# Patient Record
Sex: Female | Born: 1970 | Race: White | Hispanic: No | Smoking: Current every day smoker
Health system: Southern US, Community
[De-identification: ages and names within clinical notes are randomized; demographics above are authoritative.]

## PROBLEM LIST (undated history)

## (undated) DIAGNOSIS — R7881 Bacteremia: Secondary | ICD-10-CM

## (undated) DIAGNOSIS — K219 Gastro-esophageal reflux disease without esophagitis: Secondary | ICD-10-CM

## (undated) DIAGNOSIS — L0291 Cutaneous abscess, unspecified: Secondary | ICD-10-CM

## (undated) DIAGNOSIS — IMO0002 Reserved for concepts with insufficient information to code with codable children: Secondary | ICD-10-CM

## (undated) DIAGNOSIS — K7581 Nonalcoholic steatohepatitis (NASH): Secondary | ICD-10-CM

## (undated) DIAGNOSIS — M1990 Unspecified osteoarthritis, unspecified site: Secondary | ICD-10-CM

## (undated) DIAGNOSIS — E7800 Pure hypercholesterolemia, unspecified: Secondary | ICD-10-CM

## (undated) DIAGNOSIS — F1910 Other psychoactive substance abuse, uncomplicated: Secondary | ICD-10-CM

## (undated) DIAGNOSIS — F329 Major depressive disorder, single episode, unspecified: Secondary | ICD-10-CM

## (undated) DIAGNOSIS — K5900 Constipation, unspecified: Secondary | ICD-10-CM

## (undated) DIAGNOSIS — F32A Depression, unspecified: Secondary | ICD-10-CM

## (undated) DIAGNOSIS — M5430 Sciatica, unspecified side: Secondary | ICD-10-CM

## (undated) DIAGNOSIS — I10 Essential (primary) hypertension: Secondary | ICD-10-CM

## (undated) DIAGNOSIS — J189 Pneumonia, unspecified organism: Secondary | ICD-10-CM

## (undated) DIAGNOSIS — E282 Polycystic ovarian syndrome: Secondary | ICD-10-CM

## (undated) DIAGNOSIS — H9190 Unspecified hearing loss, unspecified ear: Secondary | ICD-10-CM

## (undated) DIAGNOSIS — K7460 Unspecified cirrhosis of liver: Secondary | ICD-10-CM

## (undated) DIAGNOSIS — G8929 Other chronic pain: Secondary | ICD-10-CM

## (undated) DIAGNOSIS — R0600 Dyspnea, unspecified: Secondary | ICD-10-CM

## (undated) DIAGNOSIS — M549 Dorsalgia, unspecified: Secondary | ICD-10-CM

## (undated) HISTORY — DX: Pure hypercholesterolemia, unspecified: E78.00

## (undated) HISTORY — PX: CHOLECYSTECTOMY: SHX55

## (undated) HISTORY — PX: BACK SURGERY: SHX140

## (undated) HISTORY — DX: Unspecified cirrhosis of liver: K74.60

## (undated) HISTORY — PX: BREAST SURGERY: SHX581

## (undated) HISTORY — PX: CARPAL TUNNEL RELEASE: SHX101

## (undated) HISTORY — PX: NECK SURGERY: SHX720

## (undated) HISTORY — DX: Unspecified hearing loss, unspecified ear: H91.90

## (undated) HISTORY — DX: Unspecified osteoarthritis, unspecified site: M19.90

---

## 1999-06-09 ENCOUNTER — Emergency Department (HOSPITAL_COMMUNITY): Admission: EM | Admit: 1999-06-09 | Discharge: 1999-06-09 | Payer: Self-pay | Admitting: Emergency Medicine

## 1999-06-15 ENCOUNTER — Emergency Department (HOSPITAL_COMMUNITY): Admission: EM | Admit: 1999-06-15 | Discharge: 1999-06-15 | Payer: Self-pay | Admitting: Emergency Medicine

## 1999-06-22 ENCOUNTER — Emergency Department (HOSPITAL_COMMUNITY): Admission: EM | Admit: 1999-06-22 | Discharge: 1999-06-22 | Payer: Self-pay | Admitting: Emergency Medicine

## 1999-07-09 ENCOUNTER — Encounter: Payer: Self-pay | Admitting: Neurosurgery

## 1999-07-09 ENCOUNTER — Ambulatory Visit (HOSPITAL_COMMUNITY): Admission: RE | Admit: 1999-07-09 | Discharge: 1999-07-09 | Payer: Self-pay | Admitting: Neurosurgery

## 1999-08-01 ENCOUNTER — Ambulatory Visit (HOSPITAL_COMMUNITY): Admission: RE | Admit: 1999-08-01 | Discharge: 1999-08-02 | Payer: Self-pay | Admitting: Neurosurgery

## 1999-08-01 ENCOUNTER — Encounter: Payer: Self-pay | Admitting: Neurosurgery

## 1999-10-29 ENCOUNTER — Encounter: Payer: Self-pay | Admitting: Neurosurgery

## 1999-10-29 ENCOUNTER — Ambulatory Visit (HOSPITAL_COMMUNITY): Admission: RE | Admit: 1999-10-29 | Discharge: 1999-10-30 | Payer: Self-pay | Admitting: Neurosurgery

## 2000-03-24 ENCOUNTER — Encounter: Payer: Self-pay | Admitting: Neurosurgery

## 2000-03-24 ENCOUNTER — Ambulatory Visit (HOSPITAL_COMMUNITY): Admission: RE | Admit: 2000-03-24 | Discharge: 2000-03-24 | Payer: Self-pay | Admitting: Neurosurgery

## 2000-04-13 ENCOUNTER — Ambulatory Visit (HOSPITAL_COMMUNITY): Admission: RE | Admit: 2000-04-13 | Discharge: 2000-04-13 | Payer: Self-pay | Admitting: Neurosurgery

## 2000-04-16 ENCOUNTER — Encounter: Payer: Self-pay | Admitting: Neurosurgery

## 2000-04-17 ENCOUNTER — Inpatient Hospital Stay (HOSPITAL_COMMUNITY): Admission: RE | Admit: 2000-04-17 | Discharge: 2000-04-18 | Payer: Self-pay | Admitting: Neurosurgery

## 2000-05-18 ENCOUNTER — Encounter: Payer: Self-pay | Admitting: Neurosurgery

## 2000-05-18 ENCOUNTER — Ambulatory Visit (HOSPITAL_COMMUNITY): Admission: RE | Admit: 2000-05-18 | Discharge: 2000-05-18 | Payer: Self-pay | Admitting: Neurosurgery

## 2000-05-26 ENCOUNTER — Encounter (HOSPITAL_COMMUNITY): Admission: RE | Admit: 2000-05-26 | Discharge: 2000-06-25 | Payer: Self-pay | Admitting: Neurosurgery

## 2000-06-26 ENCOUNTER — Encounter (HOSPITAL_COMMUNITY): Admission: RE | Admit: 2000-06-26 | Discharge: 2000-07-26 | Payer: Self-pay | Admitting: Neurosurgery

## 2000-06-29 ENCOUNTER — Ambulatory Visit (HOSPITAL_COMMUNITY): Admission: RE | Admit: 2000-06-29 | Discharge: 2000-06-29 | Payer: Self-pay | Admitting: Neurosurgery

## 2000-06-29 ENCOUNTER — Encounter: Payer: Self-pay | Admitting: Neurosurgery

## 2000-07-26 ENCOUNTER — Encounter (HOSPITAL_COMMUNITY): Admission: RE | Admit: 2000-07-26 | Discharge: 2000-08-25 | Payer: Self-pay | Admitting: Neurosurgery

## 2000-08-26 ENCOUNTER — Encounter: Admission: RE | Admit: 2000-08-26 | Discharge: 2000-08-26 | Payer: Self-pay | Admitting: Neurosurgery

## 2000-08-26 ENCOUNTER — Encounter: Payer: Self-pay | Admitting: Neurosurgery

## 2000-09-09 ENCOUNTER — Encounter: Admission: RE | Admit: 2000-09-09 | Discharge: 2000-09-09 | Payer: Self-pay | Admitting: Neurosurgery

## 2000-09-09 ENCOUNTER — Encounter: Payer: Self-pay | Admitting: Neurosurgery

## 2000-09-21 ENCOUNTER — Encounter: Payer: Self-pay | Admitting: Neurosurgery

## 2000-09-21 ENCOUNTER — Encounter: Admission: RE | Admit: 2000-09-21 | Discharge: 2000-09-21 | Payer: Self-pay | Admitting: Neurosurgery

## 2000-09-21 ENCOUNTER — Ambulatory Visit (HOSPITAL_COMMUNITY): Admission: RE | Admit: 2000-09-21 | Discharge: 2000-09-21 | Payer: Self-pay | Admitting: Neurosurgery

## 2000-12-21 ENCOUNTER — Other Ambulatory Visit: Admission: RE | Admit: 2000-12-21 | Discharge: 2000-12-21 | Payer: Self-pay

## 2001-10-07 ENCOUNTER — Encounter: Admission: RE | Admit: 2001-10-07 | Discharge: 2001-10-07 | Payer: Self-pay | Admitting: Neurosurgery

## 2001-10-07 ENCOUNTER — Encounter: Payer: Self-pay | Admitting: Neurosurgery

## 2002-03-07 ENCOUNTER — Other Ambulatory Visit: Admission: RE | Admit: 2002-03-07 | Discharge: 2002-03-07 | Payer: Self-pay

## 2003-02-16 ENCOUNTER — Ambulatory Visit (HOSPITAL_COMMUNITY): Admission: RE | Admit: 2003-02-16 | Discharge: 2003-02-16 | Payer: Self-pay | Admitting: Neurosurgery

## 2003-11-14 ENCOUNTER — Ambulatory Visit (HOSPITAL_COMMUNITY): Admission: RE | Admit: 2003-11-14 | Discharge: 2003-11-14 | Payer: Self-pay | Admitting: Obstetrics and Gynecology

## 2003-11-27 ENCOUNTER — Ambulatory Visit (HOSPITAL_COMMUNITY): Admission: RE | Admit: 2003-11-27 | Discharge: 2003-11-27 | Payer: Self-pay | Admitting: General Surgery

## 2004-02-12 ENCOUNTER — Ambulatory Visit (HOSPITAL_COMMUNITY): Admission: RE | Admit: 2004-02-12 | Discharge: 2004-02-12 | Payer: Self-pay | Admitting: Internal Medicine

## 2004-04-09 ENCOUNTER — Emergency Department (HOSPITAL_COMMUNITY): Admission: EM | Admit: 2004-04-09 | Discharge: 2004-04-09 | Payer: Self-pay | Admitting: Emergency Medicine

## 2004-09-17 ENCOUNTER — Emergency Department (HOSPITAL_COMMUNITY): Admission: EM | Admit: 2004-09-17 | Discharge: 2004-09-17 | Payer: Self-pay | Admitting: Emergency Medicine

## 2004-09-26 ENCOUNTER — Ambulatory Visit (HOSPITAL_COMMUNITY): Admission: RE | Admit: 2004-09-26 | Discharge: 2004-09-26 | Payer: Self-pay | Admitting: Internal Medicine

## 2004-11-13 ENCOUNTER — Ambulatory Visit: Payer: Self-pay | Admitting: Orthopedic Surgery

## 2005-01-13 ENCOUNTER — Ambulatory Visit (HOSPITAL_COMMUNITY): Admission: RE | Admit: 2005-01-13 | Discharge: 2005-01-13 | Payer: Self-pay | Admitting: General Surgery

## 2005-01-26 HISTORY — PX: PERIPHERALLY INSERTED CENTRAL CATHETER INSERTION: SHX2221

## 2005-01-26 HISTORY — PX: BREAST EXCISIONAL BIOPSY: SUR124

## 2005-01-26 HISTORY — PX: ESOPHAGOGASTRODUODENOSCOPY: SHX1529

## 2005-03-19 ENCOUNTER — Ambulatory Visit: Payer: Self-pay | Admitting: Family Medicine

## 2005-03-26 ENCOUNTER — Inpatient Hospital Stay (HOSPITAL_COMMUNITY): Admission: EM | Admit: 2005-03-26 | Discharge: 2005-03-28 | Payer: Self-pay | Admitting: Emergency Medicine

## 2005-03-27 ENCOUNTER — Encounter (INDEPENDENT_AMBULATORY_CARE_PROVIDER_SITE_OTHER): Payer: Self-pay | Admitting: General Surgery

## 2005-03-30 ENCOUNTER — Ambulatory Visit: Payer: Self-pay | Admitting: Sports Medicine

## 2005-04-01 ENCOUNTER — Emergency Department (HOSPITAL_COMMUNITY): Admission: EM | Admit: 2005-04-01 | Discharge: 2005-04-02 | Payer: Self-pay | Admitting: Emergency Medicine

## 2005-04-02 ENCOUNTER — Emergency Department (HOSPITAL_COMMUNITY): Admission: EM | Admit: 2005-04-02 | Discharge: 2005-04-02 | Payer: Self-pay | Admitting: Emergency Medicine

## 2005-04-16 ENCOUNTER — Ambulatory Visit: Payer: Self-pay | Admitting: Family Medicine

## 2005-05-20 ENCOUNTER — Ambulatory Visit: Payer: Self-pay | Admitting: Family Medicine

## 2005-06-19 ENCOUNTER — Ambulatory Visit: Payer: Self-pay | Admitting: Family Medicine

## 2005-07-14 ENCOUNTER — Ambulatory Visit: Payer: Self-pay | Admitting: Sports Medicine

## 2005-08-10 ENCOUNTER — Emergency Department (HOSPITAL_COMMUNITY): Admission: EM | Admit: 2005-08-10 | Discharge: 2005-08-11 | Payer: Self-pay | Admitting: Emergency Medicine

## 2005-08-13 ENCOUNTER — Ambulatory Visit: Payer: Self-pay | Admitting: Family Medicine

## 2005-08-24 ENCOUNTER — Ambulatory Visit: Payer: Self-pay | Admitting: Gastroenterology

## 2005-08-26 ENCOUNTER — Encounter (INDEPENDENT_AMBULATORY_CARE_PROVIDER_SITE_OTHER): Payer: Self-pay | Admitting: *Deleted

## 2005-08-26 ENCOUNTER — Ambulatory Visit (HOSPITAL_COMMUNITY): Admission: RE | Admit: 2005-08-26 | Discharge: 2005-08-26 | Payer: Self-pay | Admitting: Gastroenterology

## 2005-08-26 ENCOUNTER — Ambulatory Visit: Payer: Self-pay | Admitting: Gastroenterology

## 2005-09-08 ENCOUNTER — Emergency Department (HOSPITAL_COMMUNITY): Admission: EM | Admit: 2005-09-08 | Discharge: 2005-09-08 | Payer: Self-pay | Admitting: Emergency Medicine

## 2005-09-10 ENCOUNTER — Encounter (INDEPENDENT_AMBULATORY_CARE_PROVIDER_SITE_OTHER): Payer: Self-pay | Admitting: *Deleted

## 2005-09-10 ENCOUNTER — Ambulatory Visit: Payer: Self-pay | Admitting: Family Medicine

## 2005-09-29 ENCOUNTER — Ambulatory Visit: Payer: Self-pay | Admitting: Family Medicine

## 2005-10-09 ENCOUNTER — Ambulatory Visit (HOSPITAL_COMMUNITY): Admission: RE | Admit: 2005-10-09 | Discharge: 2005-10-09 | Payer: Self-pay | Admitting: Neurosurgery

## 2005-10-12 ENCOUNTER — Ambulatory Visit: Payer: Self-pay | Admitting: Sports Medicine

## 2005-10-13 ENCOUNTER — Ambulatory Visit: Payer: Self-pay | Admitting: Family Medicine

## 2005-10-29 ENCOUNTER — Emergency Department (HOSPITAL_COMMUNITY): Admission: EM | Admit: 2005-10-29 | Discharge: 2005-10-29 | Payer: Self-pay | Admitting: Emergency Medicine

## 2005-11-02 ENCOUNTER — Inpatient Hospital Stay (HOSPITAL_COMMUNITY): Admission: RE | Admit: 2005-11-02 | Discharge: 2005-11-03 | Payer: Self-pay | Admitting: Neurosurgery

## 2005-11-09 ENCOUNTER — Ambulatory Visit: Payer: Self-pay | Admitting: Family Medicine

## 2005-12-09 ENCOUNTER — Ambulatory Visit: Payer: Self-pay | Admitting: Family Medicine

## 2006-01-08 ENCOUNTER — Ambulatory Visit: Payer: Self-pay | Admitting: Sports Medicine

## 2006-02-08 ENCOUNTER — Ambulatory Visit: Payer: Self-pay | Admitting: Family Medicine

## 2006-02-22 ENCOUNTER — Encounter: Admission: RE | Admit: 2006-02-22 | Discharge: 2006-02-22 | Payer: Self-pay | Admitting: Family Medicine

## 2006-03-08 ENCOUNTER — Emergency Department (HOSPITAL_COMMUNITY): Admission: EM | Admit: 2006-03-08 | Discharge: 2006-03-08 | Payer: Self-pay | Admitting: Emergency Medicine

## 2006-03-11 ENCOUNTER — Ambulatory Visit: Payer: Self-pay | Admitting: Sports Medicine

## 2006-03-26 ENCOUNTER — Encounter (INDEPENDENT_AMBULATORY_CARE_PROVIDER_SITE_OTHER): Payer: Self-pay | Admitting: *Deleted

## 2006-04-05 ENCOUNTER — Ambulatory Visit (HOSPITAL_COMMUNITY): Admission: RE | Admit: 2006-04-05 | Discharge: 2006-04-05 | Payer: Self-pay | Admitting: Neurosurgery

## 2006-04-06 ENCOUNTER — Ambulatory Visit: Payer: Self-pay | Admitting: Family Medicine

## 2006-04-06 DIAGNOSIS — E282 Polycystic ovarian syndrome: Secondary | ICD-10-CM

## 2006-04-06 DIAGNOSIS — E119 Type 2 diabetes mellitus without complications: Secondary | ICD-10-CM

## 2006-04-06 DIAGNOSIS — R030 Elevated blood-pressure reading, without diagnosis of hypertension: Secondary | ICD-10-CM

## 2006-04-06 DIAGNOSIS — M545 Low back pain: Secondary | ICD-10-CM

## 2006-04-20 ENCOUNTER — Telehealth (INDEPENDENT_AMBULATORY_CARE_PROVIDER_SITE_OTHER): Payer: Self-pay | Admitting: *Deleted

## 2006-04-25 ENCOUNTER — Emergency Department (HOSPITAL_COMMUNITY): Admission: EM | Admit: 2006-04-25 | Discharge: 2006-04-25 | Payer: Self-pay | Admitting: Emergency Medicine

## 2006-04-28 ENCOUNTER — Telehealth (INDEPENDENT_AMBULATORY_CARE_PROVIDER_SITE_OTHER): Payer: Self-pay | Admitting: *Deleted

## 2006-05-05 ENCOUNTER — Telehealth (INDEPENDENT_AMBULATORY_CARE_PROVIDER_SITE_OTHER): Payer: Self-pay | Admitting: *Deleted

## 2006-05-10 ENCOUNTER — Ambulatory Visit: Payer: Self-pay | Admitting: Family Medicine

## 2006-06-10 ENCOUNTER — Telehealth (INDEPENDENT_AMBULATORY_CARE_PROVIDER_SITE_OTHER): Payer: Self-pay | Admitting: Family Medicine

## 2006-06-10 ENCOUNTER — Ambulatory Visit: Payer: Self-pay | Admitting: Family Medicine

## 2006-06-10 DIAGNOSIS — I10 Essential (primary) hypertension: Secondary | ICD-10-CM

## 2006-06-11 ENCOUNTER — Telehealth (INDEPENDENT_AMBULATORY_CARE_PROVIDER_SITE_OTHER): Payer: Self-pay | Admitting: Family Medicine

## 2006-06-13 ENCOUNTER — Emergency Department (HOSPITAL_COMMUNITY): Admission: EM | Admit: 2006-06-13 | Discharge: 2006-06-13 | Payer: Self-pay | Admitting: Emergency Medicine

## 2006-06-25 ENCOUNTER — Ambulatory Visit (HOSPITAL_COMMUNITY): Admission: RE | Admit: 2006-06-25 | Discharge: 2006-06-25 | Payer: Self-pay | Admitting: Neurosurgery

## 2006-07-12 ENCOUNTER — Ambulatory Visit (HOSPITAL_COMMUNITY): Admission: RE | Admit: 2006-07-12 | Discharge: 2006-07-12 | Payer: Self-pay | Admitting: Neurosurgery

## 2006-07-21 ENCOUNTER — Encounter (INDEPENDENT_AMBULATORY_CARE_PROVIDER_SITE_OTHER): Payer: Self-pay | Admitting: Family Medicine

## 2006-09-01 ENCOUNTER — Telehealth (INDEPENDENT_AMBULATORY_CARE_PROVIDER_SITE_OTHER): Payer: Self-pay | Admitting: *Deleted

## 2006-09-24 ENCOUNTER — Encounter (INDEPENDENT_AMBULATORY_CARE_PROVIDER_SITE_OTHER): Payer: Self-pay | Admitting: Family Medicine

## 2007-03-08 ENCOUNTER — Emergency Department (HOSPITAL_COMMUNITY): Admission: EM | Admit: 2007-03-08 | Discharge: 2007-03-08 | Payer: Self-pay | Admitting: Emergency Medicine

## 2007-03-17 ENCOUNTER — Ambulatory Visit (HOSPITAL_COMMUNITY): Admission: RE | Admit: 2007-03-17 | Discharge: 2007-03-17 | Payer: Self-pay | Admitting: Neurosurgery

## 2007-03-28 ENCOUNTER — Emergency Department (HOSPITAL_COMMUNITY): Admission: EM | Admit: 2007-03-28 | Discharge: 2007-03-28 | Payer: Self-pay | Admitting: Emergency Medicine

## 2007-04-11 ENCOUNTER — Emergency Department (HOSPITAL_COMMUNITY): Admission: EM | Admit: 2007-04-11 | Discharge: 2007-04-11 | Payer: Self-pay | Admitting: Emergency Medicine

## 2007-06-12 ENCOUNTER — Emergency Department (HOSPITAL_COMMUNITY): Admission: EM | Admit: 2007-06-12 | Discharge: 2007-06-12 | Payer: Self-pay | Admitting: Emergency Medicine

## 2007-09-25 ENCOUNTER — Emergency Department (HOSPITAL_COMMUNITY): Admission: EM | Admit: 2007-09-25 | Discharge: 2007-09-25 | Payer: Self-pay | Admitting: Emergency Medicine

## 2008-10-25 ENCOUNTER — Ambulatory Visit: Payer: Self-pay | Admitting: Family Medicine

## 2008-10-25 DIAGNOSIS — F19939 Other psychoactive substance use, unspecified with withdrawal, unspecified: Secondary | ICD-10-CM

## 2008-10-25 DIAGNOSIS — M549 Dorsalgia, unspecified: Secondary | ICD-10-CM

## 2008-11-03 ENCOUNTER — Encounter (INDEPENDENT_AMBULATORY_CARE_PROVIDER_SITE_OTHER): Payer: Self-pay | Admitting: *Deleted

## 2008-11-03 DIAGNOSIS — F17200 Nicotine dependence, unspecified, uncomplicated: Secondary | ICD-10-CM | POA: Insufficient documentation

## 2010-02-15 ENCOUNTER — Encounter: Payer: Self-pay | Admitting: Neurosurgery

## 2010-02-15 ENCOUNTER — Encounter: Payer: Self-pay | Admitting: Internal Medicine

## 2010-02-15 ENCOUNTER — Encounter: Payer: Self-pay | Admitting: General Surgery

## 2010-02-16 ENCOUNTER — Encounter: Payer: Self-pay | Admitting: Anesthesiology

## 2010-02-16 ENCOUNTER — Encounter: Payer: Self-pay | Admitting: Internal Medicine

## 2010-02-16 ENCOUNTER — Encounter: Payer: Self-pay | Admitting: Physical Medicine and Rehabilitation

## 2010-02-16 ENCOUNTER — Encounter: Payer: Self-pay | Admitting: Obstetrics and Gynecology

## 2010-03-16 ENCOUNTER — Encounter: Payer: Self-pay | Admitting: *Deleted

## 2010-06-10 NOTE — Op Note (Signed)
Jamie Bell, Jamie Bell           ACCOUNT NO.:  [id number]   MEDICAL RECORD NO.:  [id number]          PATIENT TYPE:  AMB   LOCATION:  SDS                          FACILITY:  [facility]   PHYSICIAN:  Kary Kos, M.D.        DATE OF BIRTH:  1970-10-21   DATE OF PROCEDURE:  06/25/2006  DATE OF DISCHARGE:  06/25/2006                               OPERATIVE REPORT   PREOPERATIVE DIAGNOSIS:  Left carpal tunnel syndrome.   PROCEDURE:  Left carpal tunnel release.   SURGEON:  Kary Kos, M.D.   ANESTHESIA:  g  Bier block with local.   HISTORY OF PRESENT ILLNESS:  The patient is a very pleasant 40 year old  female who has long-standing bilateral hand pain and numbness consistent  with carpal tunnel syndrome.  EMG showed bilateral moderately severe  carpal tunnel syndrome.  Two months ago, the patient had undergone right  carpal tunnel release, now presents for a left carpal tunnel release.  After failure of all forms of conservative treatment, risk failures of  the operation were explained to the patient, and she agreed to proceed  forward.   The patient was brought in the OR, was induced under Bier block  anesthesia in the left upper extremity.  The left hand and wrist was  then prepped and draped in the usual sterile fashion.  After  confirmation of adequate anesthesia, an incision was made extending from  just the distal crease of the wrist along the palmar crease towards the  middle finger approximately 4 cm or 5 cm in length.  Then, using  hemostats and __________  scalpel, the subcutaneous tissue was dissected  free.  Self-retaining retractor was placed.  The flexor retinaculum was  immediately identified and divided sequentially in layers until the  upper epineurium of the median nerve was visualized.  Then using  hemostat to plane, freeing up the ligament from the epineurium was  developed and the __________  scalpel was used to incise the remainder  of the ligament both proximally  and distally.  Hemostats were then  easily passed both proximally and distally confirming adequate  decompression of the median nerve sheath and also confirming no  violation of the epineurium.  At the end of the decompression, the  median nerve was free and clear with no stenosis.  The flexor  retinaculum and transverse carpal ligament was noted to be markedly  thickened prior to division and then the wound was copiously irrigated.  Meticulous hemostasis was maintained.  The skin was closed with an  interrupted vertical mattress and the hand was dressed and draped and  the patient went to the recovery room in stable condition.  At the end  of the case, needle count and sponges correct.           ______________________________  Kary Kos, M.D.     GC/MEDQ  D:  06/25/2006  T:  06/25/2006  Job:  [id number]

## 2010-06-13 NOTE — Op Note (Signed)
Jamie Bell, Jamie Bell           ACCOUNT NO.:  [id number]   MEDICAL RECORD NO.:  [id number]          PATIENT TYPE:  AMB   LOCATION:  SDS                          FACILITY:  [facility]   PHYSICIAN:  Kary Kos, M.D.        DATE OF BIRTH:  1970/07/25   DATE OF PROCEDURE:  04/05/2006  DATE OF DISCHARGE:                               OPERATIVE REPORT   PREOPERATIVE DIAGNOSIS:  Right carpal tunnel syndrome.   PROCEDURE:  Right carpal tunnel release.   SURGEON:  Kary Kos, M.D.   ANESTHESIA:  Bier block with sedation.   HISTORY OF PRESENT ILLNESS:  The patient is a very pleasant 40 year old  female who has had longstanding right hand pain with numbness and  tingling of the first three fingers of her right hand.  EMG showed  severe carpal tunnel syndrome.  The patient was refractory to bracing  and conservative treatment.  The patient was recommended right carpal  tunnel release.  Risks and benefits of operation were explained to the  patient and she understood and agreed to proceed.   PROCEDURE:  The patient was brought to the OR and was induced under Bier  block anesthesia.  The right arm was prepped and draped in usual sterile  fashion.  The right palmar surface was prepped and draped again in  routine sterile fashion.  Approximately a 4-cm incision was drawn from  the distal crease of the wrist along the palmar crease, along the line  to middle finger.  Then this was incised sharply with a 15 blade  scalpel.  A self-retaining retractor __________ was placed.  The  transverse carpal ligament was immediately identified and divided  sharply and in layers.  Then using a hemostat, the epineurium of the  median nerve was identified and the plane between the undersurface of  the transverse carpal ligament and the epineurium was developed, again  incised sharply both proximally and distally.  The hemostats easily  passed proximally and distally at the end of the division of the  transverse  carpal ligament with no stenosis or obstruction.  The  ligament was noted to be markedly thickened and causing marked  compression of the median nerve prior to division.  Then the wound was  copiously irrigated and meticulous hemostasis was maintained.  The skin  was reapproximated with interrupted vertical mattress suture.  The wound  was dressed with bacitracin, Adaptic, Kerlix roll, and Ace wrap and sent  to recovery room in stable condition.  At the end of the case needle and  instrument counts correct.           ______________________________  Kary Kos, M.D.     GC/MEDQ  D:  04/05/2006  T:  04/05/2006  Job:  [id number]

## 2010-06-13 NOTE — Op Note (Signed)
NAMEKAYDEE, MAGEL           ACCOUNT NO.:  [id number]   MEDICAL RECORD NO.:  [id number]          PATIENT TYPE:  AMB   LOCATION:  DAY                           FACILITY:  [facility]   PHYSICIAN:  Leane Para C. Tamala Julian, M.D.   DATE OF BIRTH:  November 18, 1970   DATE OF PROCEDURE:  DATE OF DISCHARGE:                                 OPERATIVE REPORT   PREOPERATIVE DIAGNOSIS:  Left breast mass.   POSTOPERATIVE DIAGNOSIS:  Left breast mass, pathology pending.   PROCEDURE:  Left partial mastectomy.   SURGEON:  Vernon Prey. Tamala Julian, M.D.   DESCRIPTION:  Under general LMA anesthesia the left breast was prepped and  draped in a sterile field.  A curvilinear circumareolar incision was made in  the lower outer quadrant.  The excision extended down to the subcutaneous  tissue.  It was then extended in the subareolar plane with resection of some  of the ducts.  The mass was grasped with Allis clamp, and using  electrocautery the mass was fully excised.  It was very hard and indurated.  Once the mass was excised it left a very large defect that took some time in  reconstructing.  It was apparent that the ductal tissue under the nipple had  been excised.  The breast tissue was reapproximated transversely.   Next, the subcutaneous tissue was reapproximated in a longitudinal plane.  This left a defect under the nipple which could not be avoided because of  the large volume of tissue that was removed, and because of the fact that  she has a small breast.  The skin was reapproximated with 4-0 Vicryl.  A  dressing was placed.   The patient was awakened from anesthesia, transferred to a bed, and taken to  the postanesthetic care unit for further monitoring.     Lero   LCS/MEDQ  D:  11/27/2003  T:  11/27/2003  Job:  [id number]

## 2010-06-13 NOTE — H&P (Signed)
Jamie Bell, Jamie Bell           ACCOUNT NO.:  [id number]   MEDICAL RECORD NO.:  [id number]          PATIENT TYPE:  OUT   LOCATION:  RAD                           FACILITY:  [facility]   PHYSICIAN:  Vernon Prey. Jamie Bell, M.D.   DATE OF BIRTH:  [birth year]   DATE OF ADMISSION:  11/14/2003  DATE OF DISCHARGE:  10/19/2005LH                                HISTORY & PHYSICAL   HISTORY OF PRESENT ILLNESS:  A 40 year old female with a history of a mass  of the left breast with suspicious characteristics on mammogram.  She had a  core needle biopsy which was negative for tumor.  The mass has a spiculated  appearance with internal blood flow, and it measures 3.2 x 2.1 x 1.4 cm.  Because of the suspicious characteristics, the patient is scheduled for  excisional biopsy.  There is no family history of breast cancer.   PAST MEDICAL HISTORY:  She has polycystic ovary disease, depression, lumbar  disk disease.   SURGERY:  1.  Cervical laminectomy with effusion and plating.  2.  Lumbar diskectomy and lumbar laminectomy with fusion bilaterally.   FAMILY HISTORY:  Positive for diabetes mellitus.   ALLERGIES:  CIPRO which causes swelling.   SOCIAL HISTORY:  She is medically disabled, unemployed, a former CMA.   PHYSICAL EXAMINATION:  VITAL SIGNS:  Blood pressure 100/70, pulse 74,  respirations 18, weight 211 pounds.  HEENT:  Unremarkable except for central alopecia.  NECK:  Supple, no JVD or bruits.  CHEST:  Clear to auscultation.  HEART:  Regular rate and rhythm without murmur, gallop or rub.  BREAST:  Induration of the left nipple with a tender subareolar mass with  skin dimpling.  This is more prominent in the lower outer quadrant.  ABDOMEN:  Soft, nontender, no masses.  EXTREMITIES:  No cyanosis, clubbing or edema.  NEUROLOGIC:  No focal, motor, sensory or cerebellar deficits.   IMPRESSION:  1.  Left breast mass with suspicious clinical and mammographic      characteristics.  2.  Polycystic ovary  disease.  3.  Depression.  4.  Cervical and lumbar disk disease.   PLAN:  The patient will have a partial mastectomy with frozen section.     Lero   LCS/MEDQ  D:  11/26/2003  T:  11/26/2003  Job:  [id number]

## 2010-06-13 NOTE — Discharge Summary (Signed)
Jamie Bell, Jamie Bell           ACCOUNT NO.:  [id number]   MEDICAL RECORD NO.:  [id number]          PATIENT TYPE:  INP   LOCATION:  A320                          FACILITY:  [facility]   PHYSICIAN:  Vernon Prey. Tamala Julian, M.D.   DATE OF BIRTH:  1970-05-27   DATE OF ADMISSION:  03/26/2005  DATE OF DISCHARGE:  03/03/2007LH                                 DISCHARGE SUMMARY   DISCHARGE DIAGNOSES:  1.  Acute cholecystitis with cholelithiasis.  2.  Fibrocystic disease.  3.  Lumbar disk disease.  4.  Chronic depression.  5.  Diabetes mellitus.   SPECIAL PROCEDURE:  Laparoscopic cholecystectomy March 2.   DISPOSITION:  The patient was discharged home in stable, satisfactory  condition.   DISCHARGE MEDICATIONS:  1.  Percocet 10/325, 1 every 4 hours as needed for pain.  2.  Xanax 1 tablet 3 times daily.   The patient is to be seen in the office in 2 weeks.  She will continue on  her Premarin, Glucophage and multivitamin that she took before admission.   SUMMARY:  A 40 year old female with a 49-monthhistory of recurrent  epigastric and right upper quadrant pain with nausea and vomiting.  She was  evaluated in the emergency room and was found to have gallstones with  thickening of the gallbladder wall.  She was admitted with plans for a  laparoscopic cholecystectomy.  This was done on March 2.  She had a  successful procedure laparoscopically.  She was found to have acute  cholecystitis.  She had an uneventful postoperative course, except that she  had pain that was not adequately controlled.  She stated that she was taking  OxyContin 80 mg 3 times daily plus Percocet 10/325 2-4 times daily.  I  informed the patient that I was not willing to write for these medications  for long term.  [facility] was [facility].  She was discharged home on March 3 in stable, satisfactory  condition with plans for followup in the office 2 weeks post discharge.      LVernon Prey STamala Julian M.D.  Electronically Signed     LCS/MEDQ  D:  07/26/2005  T:  07/26/2005  Job:  [id number]

## 2010-06-13 NOTE — Op Note (Signed)
Jamie Bell, Jamie Bell           ACCOUNT NO.:  [id number]   MEDICAL RECORD NO.:  [id number]          PATIENT TYPE:  AMB   LOCATION:  DAY                           FACILITY:  [facility]   PHYSICIAN:  Caro Hight, M.D.      DATE OF BIRTH:  11/25/1970   DATE OF PROCEDURE:  08/26/2005  DATE OF DISCHARGE:                                  PROCEDURE NOTE   PROCEDURE:  Esophagogastroduodenoscopy with cold forceps biopsy.   MEDICATIONS:  1. Demerol 100 mg IV.  2. Versed 10 mg IV.   INDICATIONS FOR PROCEDURE:  The patient is a 40 year old female with  abdominal pain.   FINDINGS:  1. Mild antral erythema.  Biopsies obtained via cold forceps to evaluate      for eosinophilic gastritis or Helicobacter pylori infection.  2. Otherwise, normal esophagus without evidence of erythema, inflammation,      mass, or Barrett esophagus.  3. Normal duodenum.   RECOMMENDATIONS:  1. Follow up biopsies.  2. The patient should have a screening colonoscopy and future endoscopies      with propofol.  3. The patient has a followup appointment scheduled with [facility].   PROCEDURE TECHNIQUE:  Physical examination was performed, and informed  consent was obtained from the patient after explaining all risks, benefits,  and alternatives to the procedure, which the patient appeared to understand  and so stated.  The patient was connected to the monitoring device and  placed in the left lateral position.  Continuous oxygen was provided via  nasal cannula and IV medicine administered via an indwelling cannula.  After  administration of sedation, the patient's esophagus was intubated, and the  scope was advanced under direct visualization to the second portion of the  duodenum.  The scope was subsequently removed slowly by carefully examining  the color, texture, anatomy, and integrity of the mucosa on the way out.  The patient was recovered in the endoscopy suite and discharged home in  satisfactory condition.      Caro Hight, M.D.  Electronically Signed     SM/MEDQ  D:  08/27/2005  T:  08/27/2005  Job:  [id number]

## 2010-06-13 NOTE — Op Note (Signed)
[facility]  Patient:    Jamie Bell, ELICKER                      MRN: [id number] Proc. Date: 04/16/00 Adm. Date:  [id number] Disc. 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  PROCEDURE:  Endoscopic diskectomy and fusion at C5-6 with 8 mm fibular allograft and 25 mm Atlantis plate, four 13 mm screws.  SURGEON:  Grayland Jack., M.D.  ASSISTANT:  Rosezena Sensor, Brooke Bonito., M.D.  ANESTHESIA:  General endotracheal.  IV FLUIDS:  1100.  ESTIMATED BLOOD LOSS:  Less than 100.  CLINICAL HISTORY:  The patient is a very pleasant 40 year old female who during her physical therapy to recover from her back surgery, which she sustained on a work-related injury, she noted progressive worsening neck pain and worse hemibody anesthesia.  MRI showed a very large ruptured disk at C5-6 compressing the spinal cord with hematomyelia within the cord.  The patients exam was consistent with hyperreflexia on the right side, anesthesia on the left side, and we extensively discussed risks and benefits of surgery with her, and she understands and agrees to proceed forward.  DESCRIPTION OF PROCEDURE:  The patient was brought in the OR, was induced under general anesthesia and positioned supine with her head in very slight extension in five pounds of traction.  The right side of her neck was prepped and draped in the usual sterile fashion, and preoperative x-ray confirmed localization of the C6 vertebral body.  A curvilinear incision was made extending from just off the midline to the anterior border of the sternocleidomastoid.  The avascular plane between the sternocleidomastoid and the omohyoid was developed after the platysma had been divided longitudinally down to the prevertebral fascia.  The prevertebral fascia was divided with Kitners.  The  longus colli was reflected laterally.  A self-retaining retractor was placed.  Intraoperative x-ray confirmed localization of the C5-6 interspace.  The anterior margin of the disk was noted to be calcified, and this was removed with a combination of Kerrison and pituitary rongeurs.  An Anspach with a blue 8 drill bit was used to drill down the end plates and the anterior margin of the annulus.  Then using a combination of micropituitaries and 1 and 2 mm Kerrison punches, the remainder of the posterior margin of the annulus was removed and two free fragments of disk were removed that had migrated subligamentous through a rent in the posterior longitudinal ligament, compressing the spinal cord.  After these were removed, the spinal cord was noted to be decompressed.  Then attention was taken to remove the remainder of the ligament in a piecemeal fashion.  A large portion of this ligament appeared to be very adherent to the dura and in trying to develop the plane from the dura and inferior ligament, there was a small jet of CSF visualized, so this ligament was noted not to be compressive and due to its adherence, it was left alone.  The remainder of the end plates were cleaned off, and the attention was taken to the foramen, and both C6 nerve root neural foramina were opened up.  On the right side, there was a thickened piece of ligament that appeared to be densely adherent  to the nerve root and was unable to be freed up, and this was also left alone because it was felt not to be compressive and a probe was able to be passed out above this ligament, out the neural foramina, without significant compression.  The remainder of the end plates were evened off.  A small amount of Tisseel was overlaid on top of the source of the leak.  The interbody spreader was placed.  The end plates were prepared for arthrodesis.  The 8 mm fibular allograft was inserted.  The distractor was removed.  A 25 mm  Atlantis plate was selected, and four 13 mm screws were inserted in the routine fashion after being drilled, tapped, and placed.  Then the wound was copiously irrigated and meticulous hemostasis was maintained.  Also of note is that when no CSF was noted to be actively egressing from its source only when attempts were made to try to get underneath the remnant of ligament that was adherent to the dura, so this was felt to be stable with just a small amount of Tisseel.  So the platysma was closed with 3-0 interrupted Vicryl, the skin was closed with running 4-0 subcuticular.  Benzoin and Steri-Strips were applied.  The wound was dressed. The patient went to the recovery room in stable condition.  At the end of the case, all needle counts, sponge counts were correct. DD:  04/16/00 TD:  04/19/00 Job: [id number]

## 2010-06-13 NOTE — Consult Note (Signed)
[name], [name]           ACCOUNT NO.:  [id number]   MEDICAL RECORD NO.:  [id number]          PATIENT TYPE:  AMB   LOCATION:  DAY                           FACILITY:  [facility]   PHYSICIAN:  Caro Hight, M.D.      DATE OF BIRTH:  1970-10-13   DATE OF CONSULTATION:  DATE OF DISCHARGE:                                   CONSULTATION   ADDRESS:  Fenton Foy, M.D.  [facility]   BODY:  Dear Dr. Domingo Cocking:   I am seeing [name] as a new patient consultation per your request.  I am seeing her for abdominal pain, vomiting and diarrhea.   [name] is a 40 year old female with a 8-10 month history of  abdominal pain, vomiting and diarrhea.  She had an evaluation in March 2007  and it revealed gallstones and cholecystitis and she had a subsequent  laparoscopic cholecystectomy.  Her diarrhea, vomiting and nausea is  unchanged.  She hurts in the middle of her stomach and she thinks it is all  related.  She has watery stool that can be explosive 3-4 times a day.  She  denies any blood in her stool.  She states prior to her gallbladder coming  out her stools were more normal.  She denies any black tarry stool.  She had  no diarrhea this weekend.  She eats and approximately 10 minutes later she  has to run to have a bowel movement.  She has been having nausea and  vomiting every morning since December 2006.  She has nausea during the  daytime.  She denies any fever, travel or abdominal wall injury.  She does  consume well water.  She was on antibiotics in March 2007 when she was  admitted for cholecystectomy.  She denies any difficulty swallowing.  She  takes over the counter Prilosec twice a day, 30 minutes before meals for the  last 2 months and her heartburn is now well-controlled.   PAST MEDICAL HISTORY:  1.  Hypertension.  2.  Polycystic ovary disease.  3.  Diabetes.  4.  Back degenerative disc disease.  5.  She has had a cholecystectomy.  6.  Three  back surgeries.  7.  A cyst removed from her ovary.   SHE IS ALLERGIC TO CIPRO AND STAPLES.  She takes lisinopril and metformin  for the last year, Premarin, Prilosec, B12 and Phenergan for nausea.  She  denies any family history of colon cancer or colon polyps.  She occasionally  drinks alcohol.  She smokes a pack a day.  She is married but disabled.  She  reports having a hemoglobin A1c of 6.3 approximately 2 months ago.   REVIEW OF SYMPTOMS:  Per the HPI otherwise all systems are negative.   PHYSICAL EXAM:  VITAL SIGNS:  Weight is 216 pounds.  Height 5 feet 5 inches.  BMI 35.4.  Temperature 98.3, blood pressure 132/70, pulse 76.  GENERAL:  She is in no apparent distress.  She has labile affect and is  somewhat tearful.  HEENT EXAM:  Atraumatic and normocephalic.  Pupils equal  and reactive to light.  Mouth - no oral lesions.  Posterior pharynx without  erythema or exudate.  NECK: Obese, full range of motion, no lymphadenopathy.  LUNGS:  Clear to  auscultation bilaterally.CARDIOVASCULAR:  Regular rhythm.  No murmur.  Normal S1 and S2. ABDOMEN:  Bowel sounds are present, soft, obese,  nondistended, no hepatosplenomegaly.  She has tenderness to palpation in the  right upper and left upper quadrant as well as in the epigastrium.  She has  no rebound or guarding.  Her pain is increased and reproducible with  Carnett's maneuver, consistent with a positive Carnett sign.  EXTREMITIES:  Without cyanosis, clubbing or edema and have changes consistent with her  diagnosis of polycystic ovary disease. NEURO:  She has no focal neurologic  deficits.   [name] is a 40 year old female with abdominal pain, vomiting and  diarrhea.  Her abdominal pain is likely musculoskeletal.  The nausea and the  vomiting may be related to gastritis, or gastroparesis.  The watery stool is  likely related to post cholecystectomy diarrhea or functional diarrhea or  irritable bowel syndrome.  Thank you for  allowing me to see Mrs. McCloskey  in consultation.  My recommendations follow.   I recommend upper endoscopy, which will be scheduled within the next 7-10  days.  If the upper endoscopy is negative, she will have a gastric emptying  study.  I have given her a referral to physical therapy for back pain and  abdominal wall pain.  She is asked to take calcium carbonate 2 with meals to  treat post cholecystectomy diarrhea.  She is also given a prescription for  Levsin sublingual tablets 0.125 mg, 1-2 p.o. 30 minutes prior to meals (may  repeat every 4 hours, maximum 10 pills per day).  She is given a  prescription for Ultram 50 mg tablets and may take 1 every 6 hours as needed  for pain, #20, refill x0.  She may follow up in 3 months. Medication side  effects were discussed to include dry mouth, dry eyes, urinary retention,  and drowsiness.   Please feel free to contact me at [phone] with additional questions.   Sincerely,      Caro Hight, M.D.  Electronically Signed     SM/MEDQ  D:  08/24/2005  T:  08/25/2005  Job:  [id number]   cc:   Lauraine Rinne, MD  Fax: [phone]

## 2010-06-13 NOTE — H&P (Signed)
Jamie Bell, Jamie Bell           ACCOUNT NO.:  [id number]   MEDICAL RECORD NO.:  [id number]          PATIENT TYPE:  INP   LOCATION:  A320                          FACILITY:  [facility]   PHYSICIAN:  Vernon Prey. Tamala Julian, M.D.   DATE OF BIRTH:  March 22, 1970   DATE OF ADMISSION:  03/26/2005  DATE OF DISCHARGE:  LH                                HISTORY & PHYSICAL   HISTORY OF PRESENT ILLNESS:  This is a 40 year old female with a one month  history of recurrent epigastric and right upper quadrant pain.  The pain  became extremely severe today.  She had episodes of nausea with vomiting.  She was seen in the emergency room where she was noted to have an acute  abdomen with tenderness in the right upper quadrant.  She was afebrile.  Ultrasound showed large gall stones with gallbladder wall thickening  compatible with acute cholecystitis.  The patient was admitted and we will  make plans for cholecystectomy, hopefully, by the laparoscopic technique.   PAST MEDICAL HISTORY:  1.  History of polycystic ovary disease.  2.  Fibrocystic disease.  3.  Lumbar disk disease.  4.  Chronic depression.  5.  Diabetes mellitus.   PAST SURGICAL HISTORY:  1.  Left partial mastectomy.  2.  Cervical laminectomy with fusion and plating.  3.  Lumbar discectomy with laminectomy and infusion bilaterally.   FAMILY HISTORY:  Positive of diabetes mellitus.   ALLERGIES:  CIPRO.   SOCIAL HISTORY:  She is married.  She is medically disabled, unemployed.  She formally worked as a Quarry manager.   PHYSICAL EXAMINATION:  VITAL SIGNS:  Blood pressure 130/80, pulse 70,  respirations 22, temperature 97.7.  HEENT:  Unremarkable, except she has frontal baldness with alopecia.  She  also has significant hirsutism.  NECK:  Supple.  No JVD, bruit,  or thyromegaly.  CHEST:  Clear to auscultation.  HEART:  Regular rate and rhythm without murmurs, gallops, rubs.  ABDOMEN:  Obese.  There is moderate tenderness in the epigastrium and entire  right upper quadrant with guarding and rebound.  She has good active bowel  sounds.  EXTREMITIES:  No cyanosis, clubbing or edema.  NEUROLOGICAL:  No focal, motor, sensory or cerebellar deficits.   IMPRESSION:  1.  Acute cholecystitis with cholelithiasis.  2.  Polycystic ovarian disease.  3.  Fibrocystic disease.  4.  Lumbar disk disease.  5.  Chronic depression.  6.  Diabetes mellitus.   PLAN:  The patient is admitted, her pain will be controlled with Dilaudid.  She will receive IV Phenergan and IV fluids.  She is started on Ancef 1 g IV  q.6 h.  Will schedule her laparoscopic cholecystectomy for the morning.      Vernon Prey. Tamala Julian, M.D.  Electronically Signed     LCS/MEDQ  D:  03/26/2005  T:  03/27/2005  Job:  [id number]

## 2010-06-13 NOTE — Op Note (Signed)
[place]. [facility]  Patient:    Jamie Bell, Jamie Bell                      MRN: [id number] Proc. Date: 08/01/99 Adm. Date:  [id number] Attending:  Kary Kos P                           Operative Report  PREOPERATIVE DIAGNOSIS:  Left L3 radiculopathy from a large free fragment of disk rupture from L2-3.  POSTOPERATIVE DIAGNOSIS:  Left L3 radiculopathy from a large free fragment of disk rupture from L2-3.  PROCEDURE:  Lumbar hemilaminectomy and microscopic diskectomy of L2-3 with microdissection of the left L3 nerve root.  SURGEON:  Julien Girt. Guy Begin., M.D.  FIRST ASSISTANT:  Earnie Larsson, M.D.  ANESTHESIA:  General endotracheal.  IV FLUIDS:  1300.  BLOOD LOSS:  Less than 100.  INDICATIONS:  The patient is a very pleasant 40 year old female who injured her back while at work lifting somebody out from a wheelchair.  Felt severe back pain, and then progressed, and was radiating down to the left leg to her knee.  She noted numbness and tingling in the same distributions. Preoperative imaging revealed a large disk rupture at L2-3 with free fragment that migrated down behind the L3 body.  The patient presents for lumbar laminectomy.  DESCRIPTION OF PROCEDURE:  The patient was brought to the OR and was induced under general anesthesia, and was positioned prone on the Wilson frame.  The back was prepped and draped in the routine sterile fashion.  Intraoperative x-ray confirmed the L2-3 interspace and the incision was centered around this confirmation.  The incision was made with the 20 blade scalpel.  Bovie electrocautery was used to take down the subcutaneous tissue and a subperiosteal dissection was carried out along the L2 and L3 lamina on the left side over the facet joint, and the McCullough retractor was placed, and then the laminotomy was begun using the Midas Rex with the AM8 drill bit, drilled off the lateral aspect of the spinous process and the  medial aspect of the lamina of L3, down around the medial aspect of the facet, and down into the L3 lamina, and using a combination of 3 mm Kerrison and 4 mm Kerrison rongeurs.  The laminotomy was continued down to the bottom of L3, above L2, and through half the top of L3.  Then, the medial facetectomy was completed. The remaining lateral ligament and the gutter was removed with a ______ Kerrison rongeur.  The microscope was prepped and draped, and brought into the field, and under microscopic guidance, microdissection was carried out along the L3 nerve root and underneath the L3 nerve root, exposing a very large free fragment of disk.  A hole in the epidural venous plexus was noted and made with the L4 Penfield and then several three or four very large free fragments of disk were fished out from underneath the axilla of the L3 nerve root, and actually also distally on the L3 nerve root behind the body of L3.  These were removed with thin and straight pituitaries, and then attention was taken to the disk space.  The disk space was incised with an 11 blade scalpel and then using the thin and straight pituitaries, and through the wound, straight pituitaries and upgoing pituitaries, the disk space was cleaned out.  No further loose fragments of disk were visualized.  Then, attention was taken  again inferiorly and there was a large osteophyte off the superior endplate of L3, and it was removed with the osteophyte remover, as well as the inferior endplate of L2.  Now, the thecal sac was noted to be completely decompressed.  However, on retraction of the thecal sac at the disk space, a small little rent was noted in the dura with the nerve root slightly herniating out of it.  This was placed back in and was retracted away.  No CSF was significantly appreciated.  The wound was copiously irrigated and probed, and was examined with a hockey stick, as well as the nerve root.  There was an  osteophyte noted to be inferior on the L3 body down by the L3-4 interspace.  However, this was not noted to be causing any significant compression at this time and the L3 nerve root on the left side was noted to be completely decompressed and free of any more disk fragments. The epidural veins were coagulated.  The wound was copiously irrigated. Gelfoam was placed beneath the L3 nerve root and overlaying underneath the thecal sac where a dural tear was appreciated, and again, this tear was noted in the ventral aspect of the thecal sac.  Then, ________ was mixed, and was overlaid along the lateral gutter and underneath to fill this up.  Gelfoam was overlaid on top of the ________ was overlaid on top of the Gelfoam.  Then, meticulous hemostasis was maintained in the muscles.  Then, the fascia was approximated with 0 interrupted Vicryl and 2-0 interrupted Vicryl was to be used in the fat and subcutaneous tissue, and the skin was closed with a running 3-0 nylon.  The patient went to the recovery room in stable condition.  At the end of the case, all needle counts and sponge counts were correct. DD:  08/01/99 TD:  08/01/99 Job: [id number]

## 2010-06-13 NOTE — Op Note (Signed)
[place]. [facility]  Patient:    Jamie Bell, Jamie Bell                      MRN: [id number] Proc. Date: 10/29/99 Adm. Date:  [id number] Attending:  Kary Kos P                           Operative Report  PREOPERATIVE DIAGNOSIS:  Recurrent ruptured disk at L2-3, and ruptured disk at L3-4 with left L3 and L4 radiculopathy.  PROCEDURE PERFORMED:  Decompressive laminectomy of L3 on the left, with removal of recurrent disk rupture at L2-3 using microscopic illumination. Microdissection to release the L3 nerve root.  Also, microscopic diskectomy of L3-4, with microdissection of the L4 nerve root on the left.  Partial laminectomy at L2 and partial laminectomy at L4.  HISTORY:  The patient is a 40 year old female who is three months status post lumbar interbody diskectomy for a large free-fragment disk at L2-3.  She has never really gotten better postoperatively from her previous surgery.  She still had persistent left lower extremity pain that would radiate down to her knee and occasionally down below her knee into the front of her shin.  She was attempted to be managed conservatively with nonsteroidal anti-inflammatories, with no significant relief.  Physical therapy the patient has basically been unable to tolerate secondary to her increasing pain.  The patient was restudied with MRI with and without contrast.  This showed a recurrent ruptured disk in the axle of the L3 nerve root (2-3), as well as significant L4 nerve root compression from a large ruptured disk central and to the left at L3-4.  The patient was then brought back into the OR to undergo the aforementioned operation.  DESCRIPTION OF PROCEDURE:  The patient was brought to the OR and induced with general anesthesia.  Placed in the [place] frame.  Her back was prepped and draped in the usual sterile fashion.  The old incision was opened up. Subperiosteal dissection was carried down along the L4, L3 and  residual L2 laminae.  A significant amount of scar tissue was noted in the previous surgical site.  Interoperative x-ray confirmed the L3-4 interspace.  Attention was taken here first.  Laminotomy was continued with a 3 mm and 4 mm Kerrison punch, exposing the dura.  The ligament of Flavum was then divided and removed.  Overlying the L3-4 interspace, the L4 nerve root was identified.  The foraminotomy with a 3 mm Kerrison punch was continued at L4.  Then 4 Penfield was used to palpate the interspace.  There was a large bulging disk fragment on the patients left L3-4, and there was a large bulging osteophyte also noted on the medial aspect of the facet at the L3-4 offset complex.  Then attention was taken to free up the scar tissue cephalad, using a 4 Penfield and a 1 Penfield.  The scar tissue overlying the dura was freed up and the laminotomy was continued cephalad.  There was a tremendous amount of scar tissue overlying the dura, but this was teased away until the laminotomy was continued up the inferior aspect of the residual bone at L2.  Then attention was taken to the lateral gutter work; again, the [employer] and the 1 Delanson were used to free up the scar tissue along the lateral gutter. This was underbiten further with the 3 mm Kerrison punch until the remainder of compression of the  medial aspect of the facet at L3 was removed.  The L3 nerve root was identified and a foraminotomy was continued with the 3 mm Kerrison punch.  The remaining interspace at L2-3 was palpated.  An operating microscope was draped and brought into the field.  Using microdissection technique we entered and the L3 nerve was identified.  The upper and lower veins overlying the interspace at L2-3 were coagulated with bipolar electrocautery.  There was noted to be a free fragment disk underneath the axle of the L3 nerve root, encased in scar at this level.  This was teased away using a nerve hook and  pituitary rongeurs were used to remove it.  In addition, the remainder of the scar tissue was freed up and another free fragment disk was removed from underneath the axle of the nerve root.  This was removed with ease with pituitary rongeurs.  A #11 scalpel was used to reincise the scar tissue over the previous anulotomy, and the remainder of the interspace was cleaned out using pituitary rongeurs.  At the end of the decompression the L3 nerve root was completely decompressed and the angled hockey stick was used to pass along the nerve root, as well as medially in the thecal sac.  Then attention was taken to the L3-4 interspace.  The epidural veins were coagulated over the top of the interspace; there was noted to be a large bulging ruptured disk at this level as well.  Under microscopic illumination, the remainder of the epidural veins were coagulated and cut.  The anulotomy was made with a #11 scalpel and then the remainder of the interspace was cleaned out using pituitary rongeurs.  The large central disk rupture at this level was then also teased away by using a downgoing Epstein curet and the hockey stick displacing the way.  At the end of the diskectomy there was noted to no further bulging fragments. The osteophytes were removed with an osteophyte remover, and the nerve root and thecal sac were found to be completely decompressed, after being reflected medially for the diskectomy.  There was no CSF appreciated.  The wound was copiously irrigated.  Meticulous hemostasis was maintained.  Gelfoam was overlaid on the top of thecal sac and nerve roots.  The wound was closed with 0 interrupted Vicryl in the fascia, 2-0 Vicryl interrupted in the subcutaneous tissue, and a running 4-0 subcuticular in the skin.  At the end of the case all needle counts and sponge counts were correct. DD:  10/29/99 TD:  10/30/99 Job: [id number]

## 2010-06-13 NOTE — Op Note (Signed)
NAMEKEELAN, POMERLEAU           ACCOUNT NO.:  [id number]   MEDICAL RECORD NO.:  [id number]          PATIENT TYPE:  INP   LOCATION:  A320                          FACILITY:  [facility]   PHYSICIAN:  Vernon Prey. Tamala Julian, M.D.   DATE OF BIRTH:  November 04, 1970   DATE OF PROCEDURE:  03/27/2005  DATE OF DISCHARGE:                                 OPERATIVE REPORT   PREOPERATIVE DIAGNOSIS:  Cholelithiasis, acute cholecystitis.   POSTOPERATIVE DIAGNOSIS:  Cholelithiasis, acute cholecystitis.   PROCEDURE:  Laparoscopic cholecystectomy.   SURGEON:  Dr. Tamala Julian.   DESCRIPTION:  Under general endotracheal anesthesia, the patient's abdomen  was prepped and draped in a sterile field. Supraumbilical incision was made,  and Veress needle was inserted uneventfully. Abdomen was insufflated with 3  liters of CO2. Using a Visiport guide, a 10-mm port was placed. Laparoscope  was placed. The patient was placed in reversed Trendelenburg position. Under  videoscopic guidance, a 10-mm port and two 5-mm ports were placed in the  right subcostal region. The gallbladder was identified. It was grasped the  assistant and position. There was inflammation of the inferior aspect of the  gallbladder. The stone impacted near the cystic duct. Using blunt  dissection, the cystic artery branches and cystic duct were dissected. The  anterior branch of the cystic artery was clamped with three clips and  divided close to the gallbladder. The cystic duct was clamped with five  clips and divided close to the gallbladder. Posterior branch of the cystic  artery was clamped with three clips and divided. Using electrocautery,  gallbladder was then separated from the intrahepatic space without  difficulty. It was placed in an EndoCatch device and retrieved. Hemostasis  in the bed was achieved. There was no significant bleeding, and there was no  evidence of bile leak. There was no need for drain. CO2 was allowed to  escape from the abdomen,  and the ports were removed. The incisions were  closed using 0 Vicryl on the fascia at the umbilicus and in the right upper  quadrant paramedian incision. Skin was closed with staples. The patient  tolerated the procedure well. Sterile dressings were placed. She was  awakened from anesthesia uneventfully, transferred to a bed and taken to the  post-anesthetic care unit for further monitoring.      Vernon Prey. Tamala Julian, M.D.  Electronically Signed     LCS/MEDQ  D:  03/27/2005  T:  03/27/2005  Job:  [id number]   cc:   Sherrilee Gilles. Gerarda Fraction, MD  Fax: [phone]

## 2010-06-13 NOTE — Op Note (Signed)
NAMESYBLE, PICCO           ACCOUNT NO.:  [id number]   MEDICAL RECORD NO.:  [id number]          PATIENT TYPE:  INP   LOCATION:  3014                         FACILITY:  [facility]   PHYSICIAN:  Kary Kos, M.D.        DATE OF BIRTH:  July 18, 1970   DATE OF PROCEDURE:  11/02/2005  DATE OF DISCHARGE:                                 OPERATIVE REPORT   PREOPERATIVE DIAGNOSES:  Lumbar radiculopathy L5 right from ruptured disk L4-  5 right.   PROCEDURES:  Lumbar laminectomy, microdiskectomy L4-5 right, with  microscopic dissection of the right L5 nerve root and microscopic  diskectomy.   SURGEON:  Kary Kos, M.D.   ASSISTANT:  Trenton Gammon.   ANESTHESIA:  General endotracheal.   HISTORY OF PRESENT ILLNESS:  Patient is a very pleasant 40 year old female  who has had longstanding back and right leg pain radiating down to the top  of the foot and big toe, consistent with an L5 radiculopathy.  Preoperative  imaging showed a very large ruptured disk at L4-5 causing severe spinal  stenosis of right-sided L5 nerve root compression.  Patient failed all forms  of conservative treatment.  Patient is recommended laminectomy and  microdiskectomy.  Risks and benefits of the operation were explained and the  patient seemed to understand and agreed to proceed forward.  The patient was  brought to the OR and induced general anesthesia, positioned prone on the  Wilson frame, the back was prepped and draped in the usual sterile fashion.  Preoperative x-ray localized the L4-5 disk space.  After infiltration of 10  mL of lidocaine with epinephrine, a midline incision was made.  Bovie  electrocautery was used to taken down subcutaneous tissue.  Subperiosteal  dissection was carried out on the lamina of L4 and L5 on the right side.  Intraoperative x-ray confirmed localization of appropriate level.  Then  using a high-speed drill, the inferior aspect of the L4 medial facet complex  and superior aspect of L5 was  drilled down.  Then using 2 and 3-mm Kerrison  punch, this laminotomy was extended cephalocaudally and laterally.  This  exposed the ligamentum flavum, which was removed in piecemeal fashion,  exposing the thecal sac proximally of L5 nerve root.  There was noted to be  marked ligamentous hypertrophy overgrowing and displacing the L5 nerve root  dorsally.  This was all underbitten, identifying further the L5 nerve root  at its foramen.  The L5 pedicle was then identified and the disk was  immediately visualized causing severe displacement of the L5 nerve root and  thecal sac.  The disk was noted to be calcified and the nerve root was noted  to be densely adherent to the disk, so it was dissected free with a nerve  hook and a 4 Penfield and reflected medially with a D'Errico.  Annulotomy  was made with 11-blade scalpel, and then using a 2- mm Kerrison punch,  partially a dorsal calcified disk was removed in piecemeal fashion, further  identifying disk space.  Then using a combination of downgoing Epstein  curettes and pituitary rongeurs, osteophyte remover and  2 Kerrison, the  remainder of the calcified part of the disk was removed and several large  fragments removed from the central compartment and disk space.  A couple of  free fragments prior to the annulotomy were removed from underneath the  proximal L5 nerve root, and this facilitated mobilization of the L5 nerve  root.  There was noted to be some marked ligamentous overgrowth at the  superior aspect of the disk space out towards the L4 neuroforamen.  This was  all underbitten, displaced with the Epstein to further open up the channel.  At the end of the diskectomy, there was no further stenosis on the thecal  sac or L5 root.  It was explored with a hockey stick coronary dilator and an  angled nerve hook.  No further fragments appreciated at the disk space.  The  wound was copiously irrigated and meticulous hemostasis was maintained.   Gelfoam was overlaid on top of the dura.  The muscle and  fascia were reapproximated with interrupted Vicryl and the skin was closed  with a running 4-0 subcuticular.  Benzoin and Steri-Strips were applied.  The patient went to the recovery room in stable condition.  At the end of  the case, needle counts and sponge counts were correct.           ______________________________  Kary Kos, M.D.     GC/MEDQ  D:  11/02/2005  T:  11/03/2005  Job:  [id number]

## 2010-10-10 ENCOUNTER — Encounter: Payer: Self-pay | Admitting: Emergency Medicine

## 2010-10-10 ENCOUNTER — Emergency Department (HOSPITAL_COMMUNITY)
Admission: EM | Admit: 2010-10-10 | Discharge: 2010-10-11 | Disposition: A | Discharge status: Home / Self Care | Payer: Medicare Other | Attending: Emergency Medicine | Admitting: Emergency Medicine

## 2010-10-10 DIAGNOSIS — F17200 Nicotine dependence, unspecified, uncomplicated: Secondary | ICD-10-CM | POA: Insufficient documentation

## 2010-10-10 DIAGNOSIS — T07XXXA Unspecified multiple injuries, initial encounter: Secondary | ICD-10-CM

## 2010-10-10 DIAGNOSIS — IMO0002 Reserved for concepts with insufficient information to code with codable children: Secondary | ICD-10-CM | POA: Insufficient documentation

## 2010-10-10 DIAGNOSIS — S0101XA Laceration without foreign body of scalp, initial encounter: Secondary | ICD-10-CM

## 2010-10-10 DIAGNOSIS — S0100XA Unspecified open wound of scalp, initial encounter: Secondary | ICD-10-CM | POA: Insufficient documentation

## 2010-10-10 DIAGNOSIS — M542 Cervicalgia: Secondary | ICD-10-CM | POA: Insufficient documentation

## 2010-10-10 DIAGNOSIS — S0190XA Unspecified open wound of unspecified part of head, initial encounter: Secondary | ICD-10-CM | POA: Insufficient documentation

## 2010-10-10 DIAGNOSIS — M25519 Pain in unspecified shoulder: Secondary | ICD-10-CM | POA: Insufficient documentation
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
## 2010-10-11 ENCOUNTER — Emergency Department (HOSPITAL_COMMUNITY): Payer: Medicare Other

## 2010-10-11 MED ORDER — HYDROMORPHONE HCL 1 MG/ML IJ SOLN
1.0000 mg | Freq: Once | INTRAMUSCULAR | Status: AC
  Administered 2010-10-11: 1 mg via INTRAMUSCULAR
  Filled 2010-10-11: qty 1

## 2010-10-11 MED ORDER — HYDROCODONE-ACETAMINOPHEN 5-325 MG PO TABS: 1.0000 | ORAL_TABLET | ORAL | Status: DC | PRN

## 2010-10-11 MED ORDER — ACETAMINOPHEN 500 MG PO TABS
1000.0000 mg | ORAL_TABLET | Freq: Once | ORAL | Status: AC
  Administered 2010-10-11: 1000 mg via ORAL
  Filled 2010-10-11: qty 2

## 2010-10-11 MED ORDER — ONDANSETRON HCL 4 MG PO TABS
4.0000 mg | ORAL_TABLET | Freq: Once | ORAL | Status: AC
  Administered 2010-10-11: 4 mg via ORAL
  Filled 2010-10-11: qty 1

## 2010-10-11 MED ORDER — BACITRACIN-NEOMYCIN-POLYMYXIN 400-5-5000 EX OINT
TOPICAL_OINTMENT | CUTANEOUS | Status: AC
  Filled 2010-10-11: qty 1
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
## 2010-10-11 NOTE — ED Notes (Signed)
Pt left er stating no needs

## 2010-10-14 ENCOUNTER — Emergency Department (HOSPITAL_COMMUNITY)
Admission: EM | Admit: 2010-10-14 | Discharge: 2010-10-14 | Disposition: A | Discharge status: Home / Self Care | Payer: Medicare Other | Attending: Emergency Medicine | Admitting: Emergency Medicine

## 2010-10-14 DIAGNOSIS — M79609 Pain in unspecified limb: Secondary | ICD-10-CM | POA: Insufficient documentation

## 2010-10-14 DIAGNOSIS — M25519 Pain in unspecified shoulder: Secondary | ICD-10-CM | POA: Insufficient documentation

## 2010-10-14 DIAGNOSIS — T07XXXA Unspecified multiple injuries, initial encounter: Secondary | ICD-10-CM

## 2010-10-14 MED ORDER — OXYCODONE-ACETAMINOPHEN 5-325 MG PO TABS: 1.0000 | ORAL_TABLET | ORAL | Status: AC | PRN

## 2010-10-14 MED ORDER — FENTANYL CITRATE 0.05 MG/ML IJ SOLN
50.0000 ug | Freq: Once | INTRAMUSCULAR | Status: AC
  Administered 2010-10-14: 50 ug via INTRAMUSCULAR
  Filled 2010-10-14: qty 2

## 2010-10-14 MED ORDER — OXYCODONE-ACETAMINOPHEN 5-300 MG PO TABS: 1.0000 | ORAL_TABLET | ORAL | Status: AC | PRN
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
## 2010-10-17 ENCOUNTER — Emergency Department (HOSPITAL_COMMUNITY)
Admission: EM | Admit: 2010-10-17 | Discharge: 2010-10-17 | Disposition: A | Discharge status: Home / Self Care | Payer: Medicare Other | Attending: Emergency Medicine | Admitting: Emergency Medicine

## 2010-10-17 ENCOUNTER — Encounter (HOSPITAL_COMMUNITY): Payer: Self-pay | Admitting: *Deleted

## 2010-10-17 DIAGNOSIS — Z4800 Encounter for change or removal of nonsurgical wound dressing: Secondary | ICD-10-CM | POA: Insufficient documentation

## 2010-10-17 DIAGNOSIS — Z4802 Encounter for removal of sutures: Secondary | ICD-10-CM | POA: Insufficient documentation

## 2010-10-17 DIAGNOSIS — IMO0001 Reserved for inherently not codable concepts without codable children: Secondary | ICD-10-CM

## 2010-10-17 MED ORDER — OXYCODONE-ACETAMINOPHEN 5-325 MG PO TABS
1.0000 | ORAL_TABLET | Freq: Once | ORAL | Status: AC
  Administered 2010-10-17: 1 via ORAL
  Filled 2010-10-17: qty 1

## 2010-10-17 MED ORDER — DOXYCYCLINE HYCLATE 100 MG PO CAPS: 100.0000 mg | ORAL_CAPSULE | Freq: Two times a day (BID) | ORAL | Status: AC

## 2010-10-17 MED ORDER — TETANUS-DIPHTH-ACELL PERTUSSIS 5-2.5-18.5 LF-MCG/0.5 IM SUSP
0.5000 mL | Freq: Once | INTRAMUSCULAR | Status: AC
  Administered 2010-10-17: 0.5 mL via INTRAMUSCULAR
  Filled 2010-10-17: qty 0.5

## 2010-10-17 MED ORDER — OXYCODONE-ACETAMINOPHEN 5-325 MG/5ML PO SOLN: 5.0000 mL | ORAL | Status: DC | PRN

## 2010-10-17 MED ORDER — DOXYCYCLINE HYCLATE 100 MG PO TABS
100.0000 mg | ORAL_TABLET | Freq: Once | ORAL | Status: AC
  Administered 2010-10-17: 100 mg via ORAL
  Filled 2010-10-17: qty 1

## 2010-10-17 MED ORDER — OXYCODONE-ACETAMINOPHEN 7.5-325 MG PO TABS: 1.0000 | ORAL_TABLET | ORAL | Status: AC | PRN
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
## 2010-10-17 NOTE — ED Provider Notes (Signed)
Medical screening examination/treatment/procedure(s) were performed by non-physician practitioner and as supervising physician I was immediately available for consultation/collaboration.  Nat Christen, MD 10/17/10 1535

## 2010-10-23 ENCOUNTER — Emergency Department (HOSPITAL_COMMUNITY)
Admission: EM | Admit: 2010-10-23 | Discharge: 2010-10-23 | Disposition: A | Discharge status: Home / Self Care | Payer: Medicare Other | Attending: Emergency Medicine | Admitting: Emergency Medicine

## 2010-10-23 ENCOUNTER — Encounter (HOSPITAL_COMMUNITY): Payer: Self-pay | Admitting: *Deleted

## 2010-10-23 DIAGNOSIS — T07XXXA Unspecified multiple injuries, initial encounter: Secondary | ICD-10-CM

## 2010-10-23 DIAGNOSIS — F17200 Nicotine dependence, unspecified, uncomplicated: Secondary | ICD-10-CM | POA: Insufficient documentation

## 2010-10-23 DIAGNOSIS — M25519 Pain in unspecified shoulder: Secondary | ICD-10-CM

## 2010-10-23 DIAGNOSIS — IMO0001 Reserved for inherently not codable concepts without codable children: Secondary | ICD-10-CM | POA: Insufficient documentation

## 2010-10-23 DIAGNOSIS — M2550 Pain in unspecified joint: Secondary | ICD-10-CM | POA: Insufficient documentation

## 2010-10-23 DIAGNOSIS — Z09 Encounter for follow-up examination after completed treatment for conditions other than malignant neoplasm: Secondary | ICD-10-CM | POA: Insufficient documentation

## 2010-10-23 MED ORDER — OXYCODONE-ACETAMINOPHEN 5-325 MG PO TABS
1.0000 | ORAL_TABLET | Freq: Once | ORAL | Status: AC
  Administered 2010-10-23: 1 via ORAL
  Filled 2010-10-23: qty 1

## 2010-10-23 MED ORDER — KETOROLAC TROMETHAMINE 60 MG/2ML IM SOLN
60.0000 mg | Freq: Once | INTRAMUSCULAR | Status: AC
  Administered 2010-10-23: 60 mg via INTRAMUSCULAR
  Filled 2010-10-23: qty 2

## 2010-10-23 MED ORDER — DICLOFENAC SODIUM 75 MG PO TBEC: 75.0000 mg | DELAYED_RELEASE_TABLET | Freq: Two times a day (BID) | ORAL | Status: DC

## 2010-10-23 MED ORDER — CYCLOBENZAPRINE HCL 10 MG PO TABS: 10.0000 mg | ORAL_TABLET | Freq: Three times a day (TID) | ORAL | Status: DC | PRN
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
## 2010-10-26 ENCOUNTER — Encounter (HOSPITAL_COMMUNITY): Payer: Self-pay | Admitting: *Deleted

## 2010-10-26 ENCOUNTER — Emergency Department (HOSPITAL_COMMUNITY)
Admission: EM | Admit: 2010-10-26 | Discharge: 2010-10-27 | Disposition: A | Discharge status: Home / Self Care | Payer: Medicare Other | Attending: Emergency Medicine | Admitting: Emergency Medicine

## 2010-10-26 DIAGNOSIS — F17200 Nicotine dependence, unspecified, uncomplicated: Secondary | ICD-10-CM | POA: Insufficient documentation

## 2010-10-26 DIAGNOSIS — M79606 Pain in leg, unspecified: Secondary | ICD-10-CM

## 2010-10-26 DIAGNOSIS — S7010XA Contusion of unspecified thigh, initial encounter: Secondary | ICD-10-CM | POA: Insufficient documentation

## 2010-10-26 DIAGNOSIS — S8010XA Contusion of unspecified lower leg, initial encounter: Secondary | ICD-10-CM | POA: Insufficient documentation

## 2010-10-26 DIAGNOSIS — IMO0002 Reserved for concepts with insufficient information to code with codable children: Secondary | ICD-10-CM | POA: Insufficient documentation

## 2010-10-26 DIAGNOSIS — M25569 Pain in unspecified knee: Secondary | ICD-10-CM | POA: Insufficient documentation

## 2010-10-26 DIAGNOSIS — Y92410 Unspecified street and highway as the place of occurrence of the external cause: Secondary | ICD-10-CM | POA: Insufficient documentation

## 2010-10-26 DIAGNOSIS — E119 Type 2 diabetes mellitus without complications: Secondary | ICD-10-CM | POA: Insufficient documentation

## 2010-10-26 DIAGNOSIS — M25469 Effusion, unspecified knee: Secondary | ICD-10-CM | POA: Insufficient documentation
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
## 2010-10-27 ENCOUNTER — Other Ambulatory Visit (HOSPITAL_COMMUNITY): Payer: Self-pay | Admitting: Emergency Medicine

## 2010-10-27 ENCOUNTER — Ambulatory Visit (HOSPITAL_COMMUNITY)
Admit: 2010-10-27 | Discharge: 2010-10-27 | Disposition: A | Discharge status: Home / Self Care | Payer: Medicare Other | Source: Ambulatory Visit | Attending: Emergency Medicine | Admitting: Emergency Medicine

## 2010-10-27 DIAGNOSIS — I809 Phlebitis and thrombophlebitis of unspecified site: Secondary | ICD-10-CM

## 2010-10-27 DIAGNOSIS — M79609 Pain in unspecified limb: Secondary | ICD-10-CM | POA: Insufficient documentation

## 2010-10-27 DIAGNOSIS — S8010XA Contusion of unspecified lower leg, initial encounter: Secondary | ICD-10-CM | POA: Insufficient documentation

## 2010-10-27 MED ORDER — HYDROCODONE-ACETAMINOPHEN 5-325 MG PO TABS: 1.0000 | ORAL_TABLET | ORAL | Status: AC | PRN

## 2010-10-27 MED ORDER — ONDANSETRON HCL 4 MG PO TABS
4.0000 mg | ORAL_TABLET | Freq: Once | ORAL | Status: AC
  Administered 2010-10-27: 4 mg via ORAL
  Filled 2010-10-27: qty 1

## 2010-10-27 MED ORDER — IBUPROFEN 800 MG PO TABS
800.0000 mg | ORAL_TABLET | Freq: Once | ORAL | Status: AC
  Administered 2010-10-27: 800 mg via ORAL
  Filled 2010-10-27: qty 1

## 2010-10-27 MED ORDER — HYDROMORPHONE HCL 1 MG/ML IJ SOLN
1.0000 mg | Freq: Once | INTRAMUSCULAR | Status: AC
  Administered 2010-10-27: 1 mg via INTRAVENOUS
  Filled 2010-10-27: qty 1
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
## 2010-11-05 NOTE — ED Provider Notes (Signed)
Evaluation and management procedures were performed by the mid-level provider (PA/NP/CNM) under my supervision/collaboration. I was present and available during the ED course. Aleicia Kenagy Y.  Saddie Benders. Dorna Mai, MD 11/05/10 [phone]

## 2010-11-18 ENCOUNTER — Emergency Department (HOSPITAL_COMMUNITY): Payer: No Typology Code available for payment source

## 2010-11-18 ENCOUNTER — Encounter (HOSPITAL_COMMUNITY): Payer: Self-pay

## 2010-11-18 ENCOUNTER — Emergency Department (HOSPITAL_COMMUNITY)
Admission: EM | Admit: 2010-11-18 | Discharge: 2010-11-18 | Disposition: A | Discharge status: Home / Self Care | Payer: No Typology Code available for payment source | Attending: Emergency Medicine | Admitting: Emergency Medicine

## 2010-11-18 DIAGNOSIS — M542 Cervicalgia: Secondary | ICD-10-CM

## 2010-11-18 DIAGNOSIS — S20219A Contusion of unspecified front wall of thorax, initial encounter: Secondary | ICD-10-CM | POA: Insufficient documentation

## 2010-11-18 DIAGNOSIS — R51 Headache: Secondary | ICD-10-CM | POA: Insufficient documentation

## 2010-11-18 DIAGNOSIS — R079 Chest pain, unspecified: Secondary | ICD-10-CM | POA: Insufficient documentation

## 2010-11-18 DIAGNOSIS — R109 Unspecified abdominal pain: Secondary | ICD-10-CM | POA: Insufficient documentation

## 2010-11-18 DIAGNOSIS — E119 Type 2 diabetes mellitus without complications: Secondary | ICD-10-CM | POA: Insufficient documentation

## 2010-11-18 DIAGNOSIS — F17200 Nicotine dependence, unspecified, uncomplicated: Secondary | ICD-10-CM | POA: Insufficient documentation

## 2010-11-18 LAB — URINALYSIS, ROUTINE W REFLEX MICROSCOPIC
Glucose, UA: >1000 mg/dL — AB
Hgb urine dipstick: NEGATIVE
Leukocytes, UA: NEGATIVE
Specific Gravity, Urine: 1.02 (ref 1.005–1.030)

## 2010-11-18 LAB — BASIC METABOLIC PANEL
CO2: 28 mEq/L (ref 19–32)
Chloride: 96 mEq/L (ref 96–112)
Glucose, Bld: 325 mg/dL — ABNORMAL HIGH (ref 70–99)
Potassium: 3.9 mEq/L (ref 3.5–5.1)
Sodium: 133 mEq/L — ABNORMAL LOW (ref 135–145)

## 2010-11-18 LAB — CBC
Hemoglobin: 15.2 g/dL — ABNORMAL HIGH (ref 12.0–15.0)
MCV: 89.2 fL (ref 78.0–100.0)
Platelets: 217 10*3/uL (ref 150–400)
RBC: 4.93 MIL/uL (ref 3.87–5.11)
WBC: 11 10*3/uL — ABNORMAL HIGH (ref 4.0–10.5)

## 2010-11-18 LAB — URINE MICROSCOPIC-ADD ON

## 2010-11-18 MED ORDER — SODIUM CHLORIDE 0.9 % IV SOLN
INTRAVENOUS | Status: DC
  Administered 2010-11-18: 19:00:00 via INTRAVENOUS

## 2010-11-18 MED ORDER — PROMETHAZINE HCL 25 MG/ML IJ SOLN
12.5000 mg | Freq: Once | INTRAMUSCULAR | Status: AC
  Administered 2010-11-18: 12.5 mg via INTRAVENOUS
  Filled 2010-11-18: qty 1

## 2010-11-18 MED ORDER — IOHEXOL 300 MG/ML  SOLN
100.0000 mL | Freq: Once | INTRAMUSCULAR | Status: AC | PRN
  Administered 2010-11-18: 100 mL via INTRAVENOUS

## 2010-11-18 MED ORDER — HYDROCODONE-ACETAMINOPHEN 10-325 MG PO TABS: 1.0000 | ORAL_TABLET | ORAL | Status: DC | PRN

## 2010-11-18 MED ORDER — CYCLOBENZAPRINE HCL 10 MG PO TABS: 10.0000 mg | ORAL_TABLET | Freq: Three times a day (TID) | ORAL | Status: DC | PRN

## 2010-11-18 MED ORDER — HYDROMORPHONE HCL 1 MG/ML IJ SOLN
1.0000 mg | Freq: Once | INTRAMUSCULAR | Status: AC
  Administered 2010-11-18: 1 mg via INTRAVENOUS
  Filled 2010-11-18: qty 1

## 2010-11-18 MED ORDER — DIPHENHYDRAMINE HCL 50 MG/ML IJ SOLN
12.5000 mg | Freq: Once | INTRAMUSCULAR | Status: AC
  Administered 2010-11-18: 12.5 mg via INTRAVENOUS
  Filled 2010-11-18: qty 1

## 2010-11-18 NOTE — ED Notes (Signed)
Pt continues to c/o pain on left side.  Reports continued nausea after moving back and forth radiology table.  PA notified.

## 2010-11-18 NOTE — ED Notes (Signed)
Pt placed on CCM showing NSR with rate 85. Pt warm and diaphoretic. States that she is in severe pain in her left ribs and upper abdomen. Family at bedside.
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
## 2010-11-19 ENCOUNTER — Ambulatory Visit (HOSPITAL_COMMUNITY)
Admit: 2010-11-19 | Discharge: 2010-11-19 | Disposition: A | Discharge status: Home / Self Care | Payer: No Typology Code available for payment source | Source: Ambulatory Visit | Attending: Family Medicine | Admitting: Family Medicine

## 2010-11-19 DIAGNOSIS — M542 Cervicalgia: Secondary | ICD-10-CM | POA: Insufficient documentation

## 2010-11-19 DIAGNOSIS — Z981 Arthrodesis status: Secondary | ICD-10-CM | POA: Insufficient documentation

## 2010-11-25 NOTE — ED Provider Notes (Signed)
Medical screening examination/treatment/procedure(s) were performed by non-physician practitioner and as supervising physician I was immediately available for consultation/collaboration. Rolland Porter, MD, Abram Sander   Janice Norrie, MD 11/25/10 [phone]

## 2010-11-27 ENCOUNTER — Emergency Department (HOSPITAL_COMMUNITY): Payer: No Typology Code available for payment source

## 2010-11-27 ENCOUNTER — Emergency Department (HOSPITAL_COMMUNITY)
Admission: EM | Admit: 2010-11-27 | Discharge: 2010-11-27 | Disposition: A | Discharge status: Home / Self Care | Payer: No Typology Code available for payment source | Attending: Emergency Medicine | Admitting: Emergency Medicine

## 2010-11-27 ENCOUNTER — Encounter (HOSPITAL_COMMUNITY): Payer: Self-pay | Admitting: *Deleted

## 2010-11-27 DIAGNOSIS — S20219A Contusion of unspecified front wall of thorax, initial encounter: Secondary | ICD-10-CM | POA: Insufficient documentation

## 2010-11-27 DIAGNOSIS — R109 Unspecified abdominal pain: Secondary | ICD-10-CM | POA: Insufficient documentation

## 2010-11-27 DIAGNOSIS — F17200 Nicotine dependence, unspecified, uncomplicated: Secondary | ICD-10-CM | POA: Insufficient documentation

## 2010-11-27 DIAGNOSIS — R079 Chest pain, unspecified: Secondary | ICD-10-CM | POA: Insufficient documentation

## 2010-11-27 DIAGNOSIS — E119 Type 2 diabetes mellitus without complications: Secondary | ICD-10-CM | POA: Insufficient documentation

## 2010-11-27 MED ORDER — HYDROMORPHONE HCL PF 2 MG/ML IJ SOLN
2.0000 mg | Freq: Once | INTRAMUSCULAR | Status: AC
  Administered 2010-11-27: 2 mg via INTRAMUSCULAR

## 2010-11-27 MED ORDER — OXYCODONE-ACETAMINOPHEN 5-325 MG PO TABS: 2.0000 | ORAL_TABLET | ORAL | Status: AC | PRN

## 2010-11-27 MED ORDER — HYDROMORPHONE HCL PF 2 MG/ML IJ SOLN
2.0000 mg | Freq: Once | INTRAMUSCULAR | Status: DC
  Filled 2010-11-27: qty 1

## 2010-11-27 MED ORDER — CYCLOBENZAPRINE HCL 10 MG PO TABS: 10.0000 mg | ORAL_TABLET | Freq: Two times a day (BID) | ORAL | Status: AC | PRN

## 2010-11-27 NOTE — ED Notes (Signed)
Patient with no complaints at this time. Respirations even and unlabored. Skin warm/dry. Discharge instructions reviewed with patient at this time. Patient given opportunity to voice concerns/ask questions. Patient discharged at this time and left Emergency Department with steady gait.   

## 2010-11-27 NOTE — ED Notes (Signed)
Patient to xray at this time
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
## 2010-12-15 ENCOUNTER — Encounter (HOSPITAL_COMMUNITY): Payer: Self-pay | Admitting: *Deleted

## 2010-12-15 ENCOUNTER — Emergency Department (HOSPITAL_COMMUNITY)
Admission: EM | Admit: 2010-12-15 | Discharge: 2010-12-15 | Disposition: A | Discharge status: Home / Self Care | Payer: Medicare Other | Attending: Emergency Medicine | Admitting: Emergency Medicine

## 2010-12-15 DIAGNOSIS — M545 Low back pain, unspecified: Secondary | ICD-10-CM | POA: Insufficient documentation

## 2010-12-15 DIAGNOSIS — G8929 Other chronic pain: Secondary | ICD-10-CM

## 2010-12-15 DIAGNOSIS — E119 Type 2 diabetes mellitus without complications: Secondary | ICD-10-CM | POA: Insufficient documentation

## 2010-12-15 DIAGNOSIS — F17200 Nicotine dependence, unspecified, uncomplicated: Secondary | ICD-10-CM | POA: Insufficient documentation

## 2010-12-15 DIAGNOSIS — Z9889 Other specified postprocedural states: Secondary | ICD-10-CM | POA: Insufficient documentation

## 2010-12-15 DIAGNOSIS — M549 Dorsalgia, unspecified: Secondary | ICD-10-CM

## 2010-12-15 MED ORDER — OXYCODONE HCL 5 MG PO TABS: ORAL_TABLET | ORAL | Status: DC

## 2010-12-15 MED ORDER — OXYCODONE HCL 5 MG PO TABS
5.0000 mg | ORAL_TABLET | Freq: Once | ORAL | Status: AC
  Administered 2010-12-15: 5 mg via ORAL
  Filled 2010-12-15: qty 1

## 2010-12-15 MED ORDER — IBUPROFEN 800 MG PO TABS
800.0000 mg | ORAL_TABLET | Freq: Once | ORAL | Status: AC
  Administered 2010-12-15: 800 mg via ORAL
  Filled 2010-12-15: qty 1

## 2010-12-15 NOTE — ED Provider Notes (Signed)
Medical screening examination/treatment/procedure(s) were performed by non-physician practitioner and as supervising physician I was immediately available for consultation/collaboration.  Veryl Speak, MD 12/15/10 2125

## 2010-12-15 NOTE — ED Notes (Signed)
Pt c/o lower back pain since yesterday morning. Pt states that the pain is so bad it is making her nauseous.

## 2010-12-15 NOTE — ED Provider Notes (Signed)
History     CSN: [id number] Arrival date & time: 12/15/2010  3:44 PM   First MD Initiated Contact with Patient 12/15/10 1615      Chief Complaint  Patient presents with  . Back Pain    (Consider location/radiation/quality/duration/timing/severity/associated sxs/prior treatment) HPI Comments: Pt has long h/o DDD and multiple HNP's  3 LS surgeries with fusion of 5 vertebrae.  Has radiation of pain to R heel.  Last evaluated by dr. Saintclair Halsted ~ 1 month ago.  Patient is a 40 y.o. female presenting with back pain. The history is provided by the patient. No language interpreter was used.  Back Pain  This is a chronic problem. Episode onset: yest AM. The problem occurs constantly. The problem has not changed since onset.Associated with: bending. The pain is present in the lumbar spine. The pain is severe. The symptoms are aggravated by bending, twisting and certain positions. The pain is the same all the time. Associated symptoms include leg pain. Treatments tried: tylenol without relief.  states it hurts her  stomach. Risk factors include obesity.    Past Medical History  Diagnosis Date  . Diabetes mellitus     Past Surgical History  Procedure Date  . Back surgery   . Cholecystectomy   . Neck surgery   . Carpal tunnel release     History reviewed. No pertinent family history.  History  Substance Use Topics  . Smoking status: Current Everyday Smoker -- 1.5 packs/day  . Smokeless tobacco: Not on file   Comment: pain pills  . Alcohol Use: No    OB History    Grav Para Term Preterm Abortions TAB SAB Ect Mult Living                  Review of Systems  Musculoskeletal: Positive for back pain.  All other systems reviewed and are negative.    Allergies  Ciprofloxacin  Home Medications   Current Outpatient Rx  Name Route Sig Dispense Refill  . IBUPROFEN 200 MG PO TABS Oral Take 400 mg by mouth as needed. For pain       BP 129/89  Pulse 108  Temp 98.7 F (37.1 C)   Resp 22  Ht 5' 5"  (1.651 m)  Wt 190 lb (86.183 kg)  BMI 31.62 kg/m2  SpO2 100%  LMP 11/27/2010  Physical Exam  Nursing note and vitals reviewed. Constitutional: She is oriented to person, place, and time. She appears well-developed and well-nourished. No distress.  HENT:  Head: Normocephalic and atraumatic.  Eyes: EOM are normal.  Neck: Normal range of motion.  Cardiovascular: Normal rate, regular rhythm and normal heart sounds.   Pulmonary/Chest: Effort normal and breath sounds normal.  Abdominal: Soft. She exhibits no distension. There is no tenderness.  Musculoskeletal:       Lumbar back: She exhibits decreased range of motion, tenderness and pain. She exhibits no bony tenderness, no swelling, no edema, no deformity, no laceration, no spasm and normal pulse.       Back:  Neurological: She is alert and oriented to person, place, and time. GCS eye subscore is 4. GCS verbal subscore is 5. GCS motor subscore is 6.  Reflex Scores:      Patellar reflexes are 2+ on the right side and 2+ on the left side.      Achilles reflexes are 2+ on the right side and 2+ on the left side. Skin: Skin is warm and dry. She is not diaphoretic.  Psychiatric: She has  a normal mood and affect. Judgment normal.    ED Course  Procedures (including critical care time)  Labs Reviewed - No data to display No results found.   No diagnosis found.    [place], [place] 12/15/10 [phone]

## 2010-12-15 NOTE — ED Notes (Signed)
Lower back pain since yesterday morning while putting on her clothes per pt, pain sharp with movement but at rest pain is dull and constant

## 2011-03-03 ENCOUNTER — Emergency Department (HOSPITAL_COMMUNITY)
Admission: EM | Admit: 2011-03-03 | Discharge: 2011-03-03 | Disposition: A | Discharge status: Home / Self Care | Payer: Medicare Other | Attending: Emergency Medicine | Admitting: Emergency Medicine

## 2011-03-03 ENCOUNTER — Encounter (HOSPITAL_COMMUNITY): Payer: Self-pay | Admitting: *Deleted

## 2011-03-03 DIAGNOSIS — G8929 Other chronic pain: Secondary | ICD-10-CM | POA: Insufficient documentation

## 2011-03-03 DIAGNOSIS — M549 Dorsalgia, unspecified: Secondary | ICD-10-CM | POA: Insufficient documentation

## 2011-03-03 DIAGNOSIS — E119 Type 2 diabetes mellitus without complications: Secondary | ICD-10-CM | POA: Insufficient documentation

## 2011-03-03 HISTORY — DX: Major depressive disorder, single episode, unspecified: F32.9

## 2011-03-03 HISTORY — DX: Essential (primary) hypertension: I10

## 2011-03-03 HISTORY — DX: Dorsalgia, unspecified: M54.9

## 2011-03-03 HISTORY — DX: Polycystic ovarian syndrome: E28.2

## 2011-03-03 HISTORY — DX: Other chronic pain: G89.29

## 2011-03-03 HISTORY — DX: Depression, unspecified: F32.A

## 2011-03-03 MED ORDER — CYCLOBENZAPRINE HCL 10 MG PO TABS: 10.0000 mg | ORAL_TABLET | Freq: Two times a day (BID) | ORAL | Status: AC | PRN

## 2011-03-03 MED ORDER — OXYCODONE-ACETAMINOPHEN 5-325 MG PO TABS: ORAL_TABLET | ORAL | Status: AC

## 2011-03-03 MED ORDER — OXYCODONE-ACETAMINOPHEN 5-325 MG PO TABS
2.0000 | ORAL_TABLET | Freq: Once | ORAL | Status: AC
  Administered 2011-03-03: 2 via ORAL
  Filled 2011-03-03: qty 2

## 2011-03-03 NOTE — ED Notes (Signed)
Reports woke up yesterday morning with lower back pain, worse on left side radiating down left leg.  Denies injury.

## 2011-03-03 NOTE — ED Provider Notes (Signed)
History   This chart was scribed for Alfonzo Feller, DO by Carolyne Littles. The patient was seen in room APA17/[facility]17 and the patient's care was started at 9:36AM.   CSN: [id number]  Arrival date & time 03/03/11  0907   First MD Initiated Contact with Patient 03/03/11 [phone]      Chief Complaint  Patient presents with  . Back Pain    HPI Patient seen at [facility] is a 41 y.o. female seen at 9:36 AM who presents to the Emergency Department complaining of gradual onset and persistence of constant acute flair of her chronic low back "pain" x2 days.  Denies any change in her usual chronic pain pattern.  Denies incont/retention of bowel or bladder, no saddle anesthesia, no focal motor weakness, no tingling/numbness in extremities, no fevers, no injury.   The symptoms have been associated with no other complaints. The patient has a significant history of similar symptoms previously, recently being evaluated for this complaint and multiple prior evals for same.  LBP began after she made her bed. Patient notes her back pain is usually relieved with use of Oxycodone and is requesting rx for same.    Past Medical History  Diagnosis Date  . Diabetes mellitus     Past Surgical History  Procedure Date  . Back surgery   . Cholecystectomy   . Neck surgery   . Carpal tunnel release     History  Substance Use Topics  . Smoking status: Current Everyday Smoker -- 1.5 packs/day    Types: Cigarettes  . Smokeless tobacco: Not on file   Comment: pain pills  . Alcohol Use: No    Review of Systems ROS: Statement: All systems negative except as marked or noted in the HPI; Constitutional: Negative for fever and chills. ; ; Eyes: Negative for eye pain, redness and discharge. ; ; ENMT: Negative for ear pain, hoarseness, nasal congestion, sinus pressure and sore throat. ; ; Cardiovascular: Negative for chest pain, palpitations, diaphoresis, dyspnea and peripheral edema. ; ; Respiratory:  Negative for cough, wheezing and stridor. ; ; Gastrointestinal: Negative for nausea, vomiting, diarrhea, abdominal pain, blood in stool, hematemesis, jaundice and rectal bleeding. . ; ; Genitourinary: Negative for dysuria, flank pain and hematuria. ; ; Musculoskeletal: +LBP.  Negative for neck pain. Negative for swelling and trauma.; ; Skin: Negative for pruritus, rash, abrasions, blisters, bruising and skin lesion.; ; Neuro: Negative for headache, lightheadedness and neck stiffness. Negative for weakness, altered level of consciousness , altered mental status, extremity weakness, paresthesias, involuntary movement, seizure and syncope.      Allergies  Ciprofloxacin  Home Medications   Current Outpatient Rx  Name Route Sig Dispense Refill  . IBUPROFEN 200 MG PO TABS Oral Take 400 mg by mouth as needed. For pain     . OXYCODONE HCL 5 MG PO TABS  One  Tab po q 4-6 hrs prn pain 20 tablet 0    BP 140/83  Pulse 64  Temp(Src) 98.4 F (36.9 C) (Oral)  Resp 20  Ht 5' 5"  (1.651 m)  Wt 195 lb (88.451 kg)  BMI 32.45 kg/m2  SpO2 99%  LMP 02/24/2011  Physical Exam Exam performed at 9:38AM Physical examination:  Nursing notes reviewed; Vital signs and O2 SAT reviewed;  Constitutional: Well developed, Well nourished, Well hydrated, Uncomfortable appearing; Head:  Normocephalic, atraumatic; Eyes: EOMI, PERRL, No scleral icterus; ENMT: Mouth and pharynx normal, Mucous membranes moist; Neck: Supple, Full range of motion, No  lymphadenopathy; Cardiovascular: Regular rate and rhythm, No murmur, rub, or gallop; Respiratory: Breath sounds clear & equal bilaterally, No rales, rhonchi, wheezes, or rub, Normal respiratory effort/excursion; Chest: Nontender, Movement normal;  Genitourinary: No CVA tenderness; Spine:  No midline CS, TS, LS tenderness.  +TTP left lumbar paraspinal muscles; Extremities: Pulses normal, No tenderness, No edema, No calf edema or asymmetry.; Neuro: AA&Ox3, Major CN grossly intact.  Strength 5/5 equal bilat UE's and LE's, including great toe dorsiflexion.  DTR 2/4 equal bilat UE's and LE's.  No gross sensory deficits.  Neg straight leg raises bilat.;.; Skin: Color normal, Warm, Dry.   ED Course  Procedures   MDM  MDM Reviewed: nursing note, previous chart and vitals    Pt with acute exacerbation of chronic LBP, will tx symptomatically, encourage PMD f/u for her chronic pain management.      I personally performed the services described in this documentation, which was scribed in my presence. The recorded information has been reviewed and considered.  [place], DO 03/05/11 1203

## 2011-05-06 ENCOUNTER — Emergency Department (HOSPITAL_COMMUNITY)
Admission: EM | Admit: 2011-05-06 | Discharge: 2011-05-06 | Disposition: A | Discharge status: Home / Self Care | Payer: Medicare Other | Attending: Emergency Medicine | Admitting: Emergency Medicine

## 2011-05-06 ENCOUNTER — Encounter (HOSPITAL_COMMUNITY): Payer: Self-pay | Admitting: *Deleted

## 2011-05-06 DIAGNOSIS — R110 Nausea: Secondary | ICD-10-CM | POA: Insufficient documentation

## 2011-05-06 DIAGNOSIS — G8929 Other chronic pain: Secondary | ICD-10-CM | POA: Insufficient documentation

## 2011-05-06 DIAGNOSIS — R51 Headache: Secondary | ICD-10-CM

## 2011-05-06 DIAGNOSIS — M549 Dorsalgia, unspecified: Secondary | ICD-10-CM | POA: Insufficient documentation

## 2011-05-06 DIAGNOSIS — E119 Type 2 diabetes mellitus without complications: Secondary | ICD-10-CM | POA: Insufficient documentation

## 2011-05-06 DIAGNOSIS — I10 Essential (primary) hypertension: Secondary | ICD-10-CM | POA: Insufficient documentation

## 2011-05-06 DIAGNOSIS — F3289 Other specified depressive episodes: Secondary | ICD-10-CM | POA: Insufficient documentation

## 2011-05-06 DIAGNOSIS — M5432 Sciatica, left side: Secondary | ICD-10-CM

## 2011-05-06 DIAGNOSIS — M5430 Sciatica, unspecified side: Secondary | ICD-10-CM | POA: Insufficient documentation

## 2011-05-06 DIAGNOSIS — F329 Major depressive disorder, single episode, unspecified: Secondary | ICD-10-CM | POA: Insufficient documentation

## 2011-05-06 DIAGNOSIS — F17200 Nicotine dependence, unspecified, uncomplicated: Secondary | ICD-10-CM | POA: Insufficient documentation

## 2011-05-06 DIAGNOSIS — E860 Dehydration: Secondary | ICD-10-CM | POA: Insufficient documentation

## 2011-05-06 MED ORDER — KETOROLAC TROMETHAMINE 30 MG/ML IJ SOLN
30.0000 mg | Freq: Once | INTRAMUSCULAR | Status: AC
  Administered 2011-05-06: 30 mg via INTRAVENOUS
  Filled 2011-05-06: qty 1

## 2011-05-06 MED ORDER — ONDANSETRON HCL 4 MG/2ML IJ SOLN
4.0000 mg | Freq: Once | INTRAMUSCULAR | Status: AC
  Administered 2011-05-06: 4 mg via INTRAVENOUS
  Filled 2011-05-06: qty 2

## 2011-05-06 MED ORDER — HYDROMORPHONE HCL PF 1 MG/ML IJ SOLN
1.0000 mg | Freq: Once | INTRAMUSCULAR | Status: AC
  Administered 2011-05-06: 1 mg via INTRAVENOUS
  Filled 2011-05-06: qty 1

## 2011-05-06 MED ORDER — OXYCODONE-ACETAMINOPHEN 5-325 MG PO TABS: 2.0000 | ORAL_TABLET | ORAL | Status: AC | PRN

## 2011-05-06 MED ORDER — SODIUM CHLORIDE 0.9 % IV BOLUS (SEPSIS)
1000.0000 mL | Freq: Once | INTRAVENOUS | Status: AC
  Administered 2011-05-06: 1000 mL via INTRAVENOUS

## 2011-05-06 MED ORDER — PREDNISONE 50 MG PO TABS: 50.0000 mg | ORAL_TABLET | Freq: Every day | ORAL | Status: AC

## 2011-05-06 MED ORDER — SODIUM CHLORIDE 0.9 % IV SOLN
Freq: Once | INTRAVENOUS | Status: AC
  Administered 2011-05-06: 21:00:00 via INTRAVENOUS

## 2011-05-06 MED ORDER — CYCLOBENZAPRINE HCL 10 MG PO TABS: 10.0000 mg | ORAL_TABLET | Freq: Two times a day (BID) | ORAL | Status: AC | PRN

## 2011-05-06 NOTE — ED Notes (Signed)
C/o headache, nausea and vomiting, low back pain radiating down left leg

## 2011-05-06 NOTE — ED Provider Notes (Signed)
History   Scribed for Nat Christen, MD, the patient was seen in room APA18/APA18 . This chart was scribed by Denice Bors.   CSN: [id number]  Arrival date & time 05/06/11  1543   First MD Initiated Contact with Patient 05/06/11 1709      Chief Complaint  Patient presents with  . Headache    (Consider location/radiation/quality/duration/timing/severity/associated sxs/prior treatment) HPI Jamie Bell is a 41 y.o. female who presents to the Emergency Department complaining of a severe headache since earlier this morning. Pt states she has a hx of migraine headaches and occur at least once a month. Pt reports associated nausea, and vomiting. Pt states symptoms improve at rest and worsen with movement. Pt additionally states pt is complaining of lower back pain radiating down left leg. Pt has a hx of chronic back pain and back surgery. Pt denies taking any medications to treat symptoms prior to arrival.   Past Medical History  Diagnosis Date  . Diabetes mellitus   . Chronic back pain   . Hypertension   . PCOS (polycystic ovarian syndrome)   . Depression     Past Surgical History  Procedure Date  . Back surgery   . Cholecystectomy   . Neck surgery   . Carpal tunnel release     No family history on file.  History  Substance Use Topics  . Smoking status: Current Everyday Smoker -- 1.5 packs/day    Types: Cigarettes  . Smokeless tobacco: Not on file   Comment: pain pills  . Alcohol Use: No    OB History    Grav Para Term Preterm Abortions TAB SAB Ect Mult Living                  Review of Systems  Constitutional: Negative.   Respiratory: Negative.   Cardiovascular: Negative.   Gastrointestinal: Positive for nausea and vomiting.  Musculoskeletal: Negative.   Skin: Negative.   Neurological: Positive for headaches.  Hematological: Negative.   Psychiatric/Behavioral: Negative.   All other systems reviewed and are negative.    Allergies   Ciprofloxacin  Home Medications   Current Outpatient Rx  Name Route Sig Dispense Refill  . IBUPROFEN 200 MG PO TABS Oral Take 600 mg by mouth as needed. For pain    . OXYCODONE-ACETAMINOPHEN 5-325 MG PO TABS Oral Take 1 tablet by mouth once as needed. For pain      BP 137/82  Pulse 86  Temp(Src) 98 F (36.7 C) (Oral)  Resp 20  Ht 5' 5"  (1.651 m)  Wt 195 lb (88.451 kg)  BMI 32.45 kg/m2  SpO2 100%  LMP 05/06/2011  Physical Exam  Nursing note and vitals reviewed. Constitutional: She is oriented to person, place, and time. She appears well-developed and well-nourished.  HENT:  Head: Normocephalic and atraumatic.  Eyes: Conjunctivae and EOM are normal. Pupils are equal, round, and reactive to light.  Neck: Normal range of motion. Neck supple.  Cardiovascular: Normal rate and regular rhythm.   Pulmonary/Chest: Effort normal and breath sounds normal.  Abdominal: Soft. Bowel sounds are normal.  Musculoskeletal: Normal range of motion.  Neurological: She is alert and oriented to person, place, and time.  Skin: Skin is warm and dry.  Psychiatric: She has a normal mood and affect.    ED Course  Procedures (including critical care time) 3:50pm: Pt informed she will be given pain medicine and IV fluids. Will follow up with pt to reassess pain level.  Labs Reviewed - No  data to display No results found.   No diagnosis found.    MDM  Patient feeling better after IV fluids pain and nausea medicine. No bowel or bladder incontinence. Discharge home with appropriate, Flexeril, prednisone        I personally performed the services described in this documentation, which was scribed in my presence. The recorded information has been reviewed and considered.    Nat Christen, MD 05/06/11 2131

## 2011-05-06 NOTE — Discharge Instructions (Signed)
Medications for pain, muscle spasm, prednisone.  Ice pack to back. Increase fluids. Follow up your neurosurgeon

## 2011-05-26 ENCOUNTER — Emergency Department (HOSPITAL_COMMUNITY)
Admission: EM | Admit: 2011-05-26 | Discharge: 2011-05-26 | Disposition: A | Discharge status: Home / Self Care | Payer: Medicare Other | Attending: Emergency Medicine | Admitting: Emergency Medicine

## 2011-05-26 ENCOUNTER — Encounter (HOSPITAL_COMMUNITY): Payer: Self-pay

## 2011-05-26 DIAGNOSIS — E119 Type 2 diabetes mellitus without complications: Secondary | ICD-10-CM | POA: Insufficient documentation

## 2011-05-26 DIAGNOSIS — G8929 Other chronic pain: Secondary | ICD-10-CM | POA: Insufficient documentation

## 2011-05-26 DIAGNOSIS — M545 Low back pain, unspecified: Secondary | ICD-10-CM | POA: Insufficient documentation

## 2011-05-26 DIAGNOSIS — I10 Essential (primary) hypertension: Secondary | ICD-10-CM | POA: Insufficient documentation

## 2011-05-26 DIAGNOSIS — M5430 Sciatica, unspecified side: Secondary | ICD-10-CM | POA: Insufficient documentation

## 2011-05-26 MED ORDER — CYCLOBENZAPRINE HCL 10 MG PO TABS: 10.0000 mg | ORAL_TABLET | Freq: Two times a day (BID) | ORAL | Status: AC | PRN

## 2011-05-26 MED ORDER — CYCLOBENZAPRINE HCL 10 MG PO TABS
10.0000 mg | ORAL_TABLET | Freq: Once | ORAL | Status: AC
  Administered 2011-05-26: 10 mg via ORAL
  Filled 2011-05-26: qty 1

## 2011-05-26 MED ORDER — OXYCODONE-ACETAMINOPHEN 5-325 MG PO TABS: 1.0000 | ORAL_TABLET | ORAL | Status: DC | PRN

## 2011-05-26 MED ORDER — OXYCODONE-ACETAMINOPHEN 5-325 MG PO TABS
1.0000 | ORAL_TABLET | Freq: Once | ORAL | Status: AC
  Administered 2011-05-26: 1 via ORAL
  Filled 2011-05-26: qty 1

## 2011-05-26 MED ORDER — IBUPROFEN 800 MG PO TABS
800.0000 mg | ORAL_TABLET | Freq: Once | ORAL | Status: AC
  Administered 2011-05-26: 800 mg via ORAL
  Filled 2011-05-26: qty 1

## 2011-05-26 MED ORDER — NAPROXEN 500 MG PO TABS: 500.0000 mg | ORAL_TABLET | Freq: Two times a day (BID) | ORAL | Status: DC

## 2011-05-26 NOTE — Discharge Instructions (Signed)
Back Pain, Adult Low back pain is very common. About 1 in 5 people have back pain.The cause of low back pain is rarely dangerous. The pain often gets better over time.About half of people with a sudden onset of back pain feel better in just 2 weeks. About 8 in 10 people feel better by 6 weeks.  CAUSES Some common causes of back pain include:  Strain of the muscles or ligaments supporting the spine.   Wear and tear (degeneration) of the spinal discs.   Arthritis.   Direct injury to the back.  DIAGNOSIS Most of the time, the direct cause of low back pain is not known.However, back pain can be treated effectively even when the exact cause of the pain is unknown.Answering your caregiver's questions about your overall health and symptoms is one of the most accurate ways to make sure the cause of your pain is not dangerous. If your caregiver needs more information, he or she may order lab work or imaging tests (X-rays or MRIs).However, even if imaging tests show changes in your back, this usually does not require surgery. HOME CARE INSTRUCTIONS For many people, back pain returns.Since low back pain is rarely dangerous, it is often a condition that people can learn to [facility] their own.   Remain active. It is stressful on the back to sit or stand in one place. Do not sit, drive, or stand in one place for more than 30 minutes at a time. Take short walks on level surfaces as soon as pain allows.Try to increase the length of time you walk each day.   Do not stay in bed.Resting more than 1 or 2 days can delay your recovery.   Do not avoid exercise or work.Your body is made to move.It is not dangerous to be active, even though your back may hurt.Your back will likely heal faster if you return to being active before your pain is gone.   Pay attention to your body when you bend and lift. Many people have less discomfortwhen lifting if they bend their knees, keep the load close to their  bodies,and avoid twisting. Often, the most comfortable positions are those that put less stress on your recovering back.   Find a comfortable position to sleep. Use a firm mattress and lie on your side with your knees slightly bent. If you lie on your back, put a pillow under your knees.   Only take over-the-counter or prescription medicines as directed by your caregiver. Over-the-counter medicines to reduce pain and inflammation are often the most helpful.Your caregiver may prescribe muscle relaxant drugs.These medicines help dull your pain so you can more quickly return to your normal activities and healthy exercise.   Put ice on the injured area.   Put ice in a plastic bag.   Place a towel between your skin and the bag.   Leave the ice on for 15 to 20 minutes, 3 to 4 times a day for the first 2 to 3 days. After that, ice and heat may be alternated to reduce pain and spasms.   Ask your caregiver about trying back exercises and gentle massage. This may be of some benefit.   Avoid feeling anxious or stressed.Stress increases muscle tension and can worsen back pain.It is important to recognize when you are anxious or stressed and learn ways to manage it.Exercise is a great option.  SEEK MEDICAL CARE IF:  You have pain that is not relieved with rest or medicine.   You have  pain that does not improve in 1 week.   You have new symptoms.   You are generally not feeling well.  SEEK IMMEDIATE MEDICAL CARE IF:   You have pain that radiates from your back into your legs.   You develop new bowel or bladder control problems.   You have unusual weakness or numbness in your arms or legs.   You develop nausea or vomiting.   You develop abdominal pain.   You feel faint.  Document Released: 01/12/2005 Document Revised: 01/01/2011 Document Reviewed: 06/02/2010 [facility] Patient Information 2012 [place].  Smoking Hazards Smoking cigarettes is extremely bad for your health.  Tobacco smoke has over 200 known poisons in it. There are over 60 chemicals in tobacco smoke that cause cancer. Some of the chemicals found in cigarette smoke include:   Cyanide.   Benzene.   Formaldehyde.   Methanol (wood alcohol).   Acetylene (fuel used in welding torches).   Ammonia.  Cigarette smoke also contains the poisonous gases nitrogen oxide and carbon monoxide.  Cigarette smokers have an increased risk of many serious medical problems, including:  Lung cancer.   Lung disease (such as pneumonia, bronchitis, and emphysema).   Heart attack and chest pain due to the heart not getting enough oxygen (angina).   Heart disease and peripheral blood vessel disease.   Hypertension.   Stroke.   Oral cancer (cancer of the lip, mouth, or voice box).   Bladder cancer.   Pancreatic cancer.   Cervical cancer.   Pregnancy complications, including premature birth.   Low birthweight babies.   Early menopause.   Lower estrogen level for women.   Infertility.   Facial wrinkles.   Blindness.   Increased risk of broken bones (fractures).   Senile dementia.   Stillbirths and smaller newborn babies, birth defects, and genetic damage to sperm.   Stomach ulcers and internal bleeding.  Children of smokers have an increased risk of the following, because of secondhand smoke exposure:   Sudden infant death syndrome (SIDS).   Respiratory infections.   Lung cancer.   Heart disease.   Ear infections.  Smoking causes approximately:  90% of all lung cancer deaths in men.   80% of all lung cancer deaths in women.   90% of deaths from chronic obstructive lung disease.  Compared with nonsmokers, smoking increases the risk of:  Coronary heart disease by 2 to 4 times.   Stroke by 2 to 4 times.   Men developing lung cancer by 23 times.   Women developing lung cancer by 13 times.   Dying from chronic obstructive lung diseases by 12 times.  Someone who smokes 2  packs a day loses about 8 years of his or her life. Even smoking lightly shortens your life expectancy by several years. You can greatly reduce the risk of medical problems for you and your family by stopping now. Smoking is the most preventable cause of death and disease in our society. Within days of quitting smoking, your circulation returns to normal, you decrease the risk of having a heart attack, and your lung capacity improves. There may be some increased phlegm in the first few days after quitting, and it may take months for your lungs to clear up completely. Quitting for 10 years cuts your lung cancer risk to almost that of a nonsmoker. WHY IS SMOKING ADDICTIVE?  Nicotine is the chemical agent in tobacco that is capable of causing addiction or dependence.   When you smoke and inhale,  nicotine is absorbed rapidly into the bloodstream through your lungs. Nicotine absorbed through the lungs is capable of creating a powerful addiction. Both inhaled and non-inhaled nicotine may be addictive.   Addiction studies of cigarettes and spit tobacco show that addiction to nicotine occurs mainly during the teen years, when young people begin using tobacco products.  WHAT ARE THE BENEFITS OF QUITTING?  There are many health benefits to quitting smoking.   Likelihood of developing cancer and heart disease decreases. Health improvements are seen almost immediately.   Blood pressure, pulse rate, and breathing patterns start returning to normal soon after quitting.   People who quit may see an improvement in their overall quality of life.  Some people choose to quit all at once. Other options include nicotine replacement products, such as patches, gum, and nasal sprays. Do not use these products without first checking with your caregiver. QUITTING SMOKING It is not easy to quit smoking. Nicotine is addicting, and longtime habits are hard to change. To start, you can write down all your reasons for quitting,  tell your family and friends you want to quit, and ask for their help. Throw your cigarettes away, chew gum or cinnamon sticks, keep your hands busy, and drink extra water or juice. Go for walks and practice deep breathing to relax. Think of all the money you are saving: around $1,000 a year, for the average pack-a-day smoker. Nicotine patches and gum have been shown to improve success at efforts to stop smoking. Zyban (bupropion) is an anti-depressant drug that can be prescribed to reduce nicotine withdrawal symptoms and to suppress the urge to smoke. Smoking is an addiction with both physical and psychological effects. Joining a stop-smoking support group can help you cope with the emotional issues. For more information and advice on programs to stop smoking, call your doctor, your local hospital, or these organizations:  [place] - 1-800-ACS-2345  Document Released: 02/20/2004 Document Revised: 01/01/2011 Document Reviewed: 10/24/2008 [facility] Patient Information 2012 [place].  Naproxen and naproxen sodium oral immediate-release tablets What is this medicine? NAPROXEN (na PROX en) is a non-steroidal anti-inflammatory drug (NSAID). It is used to reduce swelling and to treat pain. This medicine may be used for dental pain, headache, or painful monthly periods. It is also used for painful joint and muscular problems such as arthritis, tendinitis, bursitis, and gout. This medicine may be used for other purposes; ask your health care provider or pharmacist if you have questions. What should I tell my health care provider before I take this medicine? They need to know if you have any of these conditions: -asthma -cigarette smoker -drink more than 3 alcohol containing drinks a day -heart disease or circulation problems such as heart failure or leg edema (fluid retention) -high blood pressure -kidney disease -liver disease -stomach  bleeding or ulcers -an unusual or allergic reaction to naproxen, aspirin, other NSAIDs, other medicines, foods, dyes, or preservatives -pregnant or trying to get pregnant -breast-feeding How should I use this medicine? Take this medicine by mouth with a glass of water. Follow the directions on the prescription label. Take it with food if your stomach gets upset. Try to not lie down for at least 10 minutes after you take it. Take your medicine at regular intervals. Do not take your medicine more often than directed. Long-term, continuous use may increase the risk of heart attack or stroke. A special MedGuide will be given to you by the  pharmacist with each prescription and refill. Be sure to read this information carefully each time. Talk to your pediatrician regarding the use of this medicine in children. Special care may be needed. Overdosage: If you think you have taken too much of this medicine contact a poison control center or emergency room at once. NOTE: This medicine is only for you. Do not share this medicine with others. What if I miss a dose? If you miss a dose, take it as soon as you can. If it is almost time for your next dose, take only that dose. Do not take double or extra doses. What may interact with this medicine? -alcohol -aspirin -cidofovir -diuretics -lithium -methotrexate -other drugs for inflammation like ketorolac or prednisone -pemetrexed -probenecid -warfarin This list may not describe all possible interactions. Give your health care provider a list of all the medicines, herbs, non-prescription drugs, or dietary supplements you use. Also tell them if you smoke, drink alcohol, or use illegal drugs. Some items may interact with your medicine. What should I watch for while using this medicine? Tell your doctor or health care professional if your pain does not get better. Talk to your doctor before taking another medicine for pain. Do not treat yourself. This medicine  does not prevent heart attack or stroke. In fact, this medicine may increase the chance of a heart attack or stroke. The chance may increase with longer use of this medicine and in people who have heart disease. If you take aspirin to prevent heart attack or stroke, talk with your doctor or health care professional. Do not take other medicines that contain aspirin, ibuprofen, or naproxen with this medicine. Side effects such as stomach upset, nausea, or ulcers may be more likely to occur. Many medicines available without a prescription should not be taken with this medicine. This medicine can cause ulcers and bleeding in the stomach and intestines at any time during treatment. Do not smoke cigarettes or drink alcohol. These increase irritation to your stomach and can make it more susceptible to damage from this medicine. Ulcers and bleeding can happen without warning symptoms and can cause death. You may get drowsy or dizzy. Do not drive, use machinery, or do anything that needs mental alertness until you know how this medicine affects you. Do not stand or sit up quickly, especially if you are an older patient. This reduces the risk of dizzy or fainting spells. This medicine can cause you to bleed more easily. Try to avoid damage to your teeth and gums when you brush or floss your teeth. What side effects may I notice from receiving this medicine? Side effects that you should report to your doctor or health care professional as soon as possible: -black or bloody stools, blood in the urine or vomit -blurred vision -chest pain -difficulty breathing or wheezing -nausea or vomiting -severe stomach pain -skin rash, skin redness, blistering or peeling skin, hives, or itching -slurred speech or weakness on one side of the body -swelling of eyelids, throat, lips -unexplained weight gain or swelling -unusually weak or tired -yellowing of eyes or skin Side effects that usually do not require medical  attention (report to your doctor or health care professional if they continue or are bothersome): -constipation -headache -heartburn This list may not describe all possible side effects. Call your doctor for medical advice about side effects. You may report side effects to FDA at 1-800-FDA-1088. Where should I keep my medicine? Keep out of the reach of children. Store at  room temperature between 15 and 30 degrees C (59 and 86 degrees F). Keep container tightly closed. Throw away any unused medicine after the expiration date. NOTE: This sheet is a summary. It may not cover all possible information. If you have questions about this medicine, talk to your doctor, pharmacist, or health care provider.  2012, [employer]. (01/14/2009 8:10:16 PM)  Cyclobenzaprine tablets What is this medicine? CYCLOBENZAPRINE (sye kloe BEN za preen) is a muscle relaxer. It is used to treat muscle pain, spasms, and stiffness. This medicine may be used for other purposes; ask your health care provider or pharmacist if you have questions. What should I tell my health care provider before I take this medicine? They need to know if you have any of these conditions: -heart disease, irregular heartbeat, or previous heart attack -liver disease -thyroid problem -an unusual or allergic reaction to cyclobenzaprine, tricyclic antidepressants, lactose, other medicines, foods, dyes, or preservatives -pregnant or trying to get pregnant -breast-feeding How should I use this medicine? Take this medicine by mouth with a glass of water. Follow the directions on the prescription label. If this medicine upsets your stomach, take it with food or milk. Take your medicine at regular intervals. Do not take it more often than directed. Talk to your pediatrician regarding the use of this medicine in children. Special care may be needed. Overdosage: If you think you have taken too much of this medicine contact a poison control  center or emergency room at once. NOTE: This medicine is only for you. Do not share this medicine with others. What if I miss a dose? If you miss a dose, take it as soon as you can. If it is almost time for your next dose, take only that dose. Do not take double or extra doses. What may interact with this medicine? Do not take this medicine with any of the following medications: -cisapride -droperidol -flecainide -grepafloxacin -halofantrine -levomethadyl -MAOIs like Carbex, Eldepryl, Marplan, Nardil, and Parnate -nilotinib -pimozide -probucol -sertindole This medicine may also interact with the following medications: -abarelix -alcohol -contrast dyes -dolasetron -guanethidine -medicines for cancer -medicines for depression, anxiety, or psychotic disturbances -medicines to treat an irregular heartbeat -medicines used for sleep or numbness during surgery or procedure -methadone -octreotide -ondansetron -palonosetron -phenothiazines like chlorpromazine, mesoridazine, prochlorperazine, thioridazine -some medicines for infection like alfuzosin, chloroquine, clarithromycin, levofloxacin, mefloquine, pentamidine, troleandomycin -tramadol -vardenafil This list may not describe all possible interactions. Give your health care provider a list of all the medicines, herbs, non-prescription drugs, or dietary supplements you use. Also tell them if you smoke, drink alcohol, or use illegal drugs. Some items may interact with your medicine. What should I watch for while using this medicine? Check with your doctor or health care professional if your condition does not improve within 1 to 3 weeks. You may get drowsy or dizzy when you first start taking the medicine or change doses. Do not drive, use machinery, or do anything that may be dangerous until you know how the medicine affects you. Stand or sit up slowly. Your mouth may get dry. Drinking water, chewing sugarless gum, or sucking on hard  candy may help. What side effects may I notice from receiving this medicine? Side effects that you should report to your doctor or health care professional as soon as possible: -allergic reactions like skin rash, itching or hives, swelling of the face, lips, or tongue -chest pain -fast heartbeat -hallucinations -seizures -vomiting Side effects that usually do not require medical attention (report  to your doctor or health care professional if they continue or are bothersome): -headache This list may not describe all possible side effects. Call your doctor for medical advice about side effects. You may report side effects to FDA at 1-800-FDA-1088. Where should I keep my medicine? Keep out of the reach of children. Store at room temperature between 15 and 30 degrees C (59 and 86 degrees F). Keep container tightly closed. Throw away any unused medicine after the expiration date. NOTE: This sheet is a summary. It may not cover all possible information. If you have questions about this medicine, talk to your doctor, pharmacist, or health care provider.  2012, [employer]. (04/25/2007 10:26:21 PM)  Acetaminophen; Oxycodone tablets What is this medicine? ACETAMINOPHEN; OXYCODONE (a set a MEE noe fen; ox i KOE done) is a pain reliever. It is used to treat mild to moderate pain. This medicine may be used for other purposes; ask your health care provider or pharmacist if you have questions. What should I tell my health care provider before I take this medicine? They need to know if you have any of these conditions: -brain tumor -Crohn's disease, inflammatory bowel disease, or ulcerative colitis -drink more than 3 alcohol containing drinks per day -drug abuse or addiction -head injury -heart or circulation problems -kidney disease or problems going to the bathroom -liver disease -lung disease, asthma, or breathing problems -an unusual or allergic reaction to acetaminophen, oxycodone,  other opioid analgesics, other medicines, foods, dyes, or preservatives -pregnant or trying to get pregnant -breast-feeding How should I use this medicine? Take this medicine by mouth with a full glass of water. Follow the directions on the prescription label. Take your medicine at regular intervals. Do not take your medicine more often than directed. Talk to your pediatrician regarding the use of this medicine in children. Special care may be needed. Patients over 56 years old may have a stronger reaction and need a smaller dose. Overdosage: If you think you have taken too much of this medicine contact a poison control center or emergency room at once. NOTE: This medicine is only for you. Do not share this medicine with others. What if I miss a dose? If you miss a dose, take it as soon as you can. If it is almost time for your next dose, take only that dose. Do not take double or extra doses. What may interact with this medicine? -alcohol or medicines that contain alcohol -antihistamines -barbiturates like amobarbital, butalbital, butabarbital, methohexital, pentobarbital, phenobarbital, thiopental, and secobarbital -benztropine -drugs for bladder problems like solifenacin, trospium, oxybutynin, tolterodine, hyoscyamine, and methscopolamine -drugs for breathing problems like ipratropium and tiotropium -drugs for certain stomach or intestine problems like propantheline, homatropine methylbromide, glycopyrrolate, atropine, belladonna, and dicyclomine -general anesthetics like etomidate, ketamine, nitrous oxide, propofol, desflurane, enflurane, halothane, isoflurane, and sevoflurane -medicines for depression, anxiety, or psychotic disturbances -medicines for pain like codeine, morphine, pentazocine, buprenorphine, butorphanol, nalbuphine, tramadol, and propoxyphene -medicines for sleep -muscle relaxants -naltrexone -phenothiazines like perphenazine, thioridazine, chlorpromazine, mesoridazine,  fluphenazine, prochlorperazine, promazine, and trifluoperazine -scopolamine -trihexyphenidyl This list may not describe all possible interactions. Give your health care provider a list of all the medicines, herbs, non-prescription drugs, or dietary supplements you use. Also tell them if you smoke, drink alcohol, or use illegal drugs. Some items may interact with your medicine. What should I watch for while using this medicine? Tell your doctor or health care professional if your pain does not go away, if it gets worse, or if you  have new or a different type of pain. You may develop tolerance to the medicine. Tolerance means that you will need a higher dose of the medication for pain relief. Tolerance is normal and is expected if you take this medicine for a long time. Do not suddenly stop taking your medicine because you may develop a severe reaction. Your body becomes used to the medicine. This does NOT mean you are addicted. Addiction is a behavior related to getting and using a drug for a nonmedical reason. If you have pain, you have a medical reason to take pain medicine. Your doctor will tell you how much medicine to take. If your doctor wants you to stop the medicine, the dose will be slowly lowered over time to avoid any side effects. You may get drowsy or dizzy. Do not drive, use machinery, or do anything that needs mental alertness until you know how this medicine affects you. Do not stand or sit up quickly, especially if you are an older patient. This reduces the risk of dizzy or fainting spells. Alcohol may interfere with the effect of this medicine. Avoid alcoholic drinks. The medicine will cause constipation. Try to have a bowel movement at least every 2 to 3 days. If you do not have a bowel movement for 3 days, call your doctor or health care professional. Do not take Tylenol (acetaminophen) or medicines that have acetaminophen with this medicine. Too much acetaminophen can be very dangerous.  Many nonprescription medicines contain acetaminophen. Always read the labels carefully to avoid taking more acetaminophen. What side effects may I notice from receiving this medicine? Side effects that you should report to your doctor or health care professional as soon as possible: -allergic reactions like skin rash, itching or hives, swelling of the face, lips, or tongue -breathing difficulties, wheezing -confusion -light headedness or fainting spells -severe stomach pain -yellowing of the skin or the whites of the eyes Side effects that usually do not require medical attention (report to your doctor or health care professional if they continue or are bothersome): -dizziness -drowsiness -nausea -vomiting This list may not describe all possible side effects. Call your doctor for medical advice about side effects. You may report side effects to FDA at 1-800-FDA-1088. Where should I keep my medicine? Keep out of the reach of children. This medicine can be abused. Keep your medicine in a safe place to protect it from theft. Do not share this medicine with anyone. Selling or giving away this medicine is dangerous and against the law. Store at room temperature between 20 and 25 degrees C (68 and 77 degrees F). Keep container tightly closed. Protect from light. Flush any unused medicines down the toilet. Do not use the medicine after the expiration date. NOTE: This sheet is a summary. It may not cover all possible information. If you have questions about this medicine, talk to your doctor, pharmacist, or health care provider.  2012, Elsevier/Gold Standard. (12/12/2007 10:01:21 AM)

## 2011-05-26 NOTE — ED Notes (Signed)
Patient with no complaints at this time. Respirations even and unlabored. Skin warm/dry. Discharge instructions reviewed with patient at this time. Patient given opportunity to voice concerns/ask questions. Patient discharged at this time and left Emergency Department with steady gait.   

## 2011-05-26 NOTE — ED Notes (Signed)
Was moving furniture over the weekend, began having severe back pain during the night, h/o back pain

## 2011-05-26 NOTE — ED Provider Notes (Signed)
History   This chart was scribed for Delora Fuel, MD by Kathreen Cornfield. The patient was seen in room APA04/APA04 and the patient's care was started at 7:50 AM     CSN: [id number]  Arrival date & time 05/26/11  0723   First MD Initiated Contact with Patient 05/26/11 0745      Chief Complaint  Patient presents with  . Back Pain    (Consider location/radiation/quality/duration/timing/severity/associated sxs/prior treatment) HPI  Jamie Bell is a 41 y.o. female who presents to the Emergency Department complaining of moderate, episodic back pain onset yesterday with associated symptoms of radiating left leg pain. The pt states "my back and my legs hurt, I have not got much rest all night long. Pt states the pain "is sharp and dull." Pt rates the pain 8/10. Modifying factors include taking alieve which provides moderate relief. Pt has a hx of back surgery.   Pt denies any bowel trouble, any other pain medications at home, driving herself here.   Pt is a smoker, smokes 1 pack a day.   Pt does not have a PCP.   Past Medical History  Diagnosis Date  . Diabetes mellitus   . Chronic back pain   . Hypertension   . PCOS (polycystic ovarian syndrome)   . Depression     Past Surgical History  Procedure Date  . Back surgery   . Cholecystectomy   . Neck surgery   . Carpal tunnel release       History  Substance Use Topics  . Smoking status: Current Everyday Smoker -- 1.5 packs/day    Types: Cigarettes  . Smokeless tobacco: Not on file   Comment: pain pills  . Alcohol Use: No    OB History    Grav Para Term Preterm Abortions TAB SAB Ect Mult Living                  Review of Systems  All other systems reviewed and are negative.    10 Systems reviewed and all are negative for acute change except as noted in the HPI.    Allergies  Ciprofloxacin  Home Medications   Current Outpatient Rx  Name Route Sig Dispense Refill  . IBUPROFEN 200 MG PO TABS Oral Take  600 mg by mouth as needed. For pain    . OXYCODONE-ACETAMINOPHEN 5-325 MG PO TABS Oral Take 1 tablet by mouth once as needed. For pain      BP 142/90  Pulse 110  Temp(Src) 98.9 F (37.2 C) (Oral)  Resp 22  Ht 5' 5"  (1.651 m)  Wt 190 lb (86.183 kg)  BMI 31.62 kg/m2  SpO2 100%  LMP 05/12/2011  Physical Exam  Nursing note and vitals reviewed. Constitutional: She is oriented to person, place, and time. She appears well-developed and well-nourished.       Pain  HENT:  Head: Normocephalic and atraumatic.  Nose: Nose normal.  Eyes: Conjunctivae and EOM are normal. Right eye exhibits no discharge. Left eye exhibits no discharge.  Neck: Normal range of motion. Neck supple.  Cardiovascular: Normal rate, regular rhythm and normal heart sounds.   Pulmonary/Chest: Effort normal and breath sounds normal.  Abdominal: Soft.  Musculoskeletal: She exhibits tenderness (Tender in the lower lumbar region, moderate bilateral perilumbar spsam, positive straight leg raise test at 5 degrees. ). She exhibits no edema.  Neurological: She is alert and oriented to person, place, and time.  Skin: Skin is warm and dry.  Psychiatric: She has a  normal mood and affect. Her behavior is normal.    ED Course  Procedures (including critical care time)  DIAGNOSTIC STUDIES: Oxygen Saturation is 100% on room air, normal by my interpretation.    COORDINATION OF CARE:       1. Low back pain   2. Sciatica     7:57AM- EDP at bedside discusses treatment plan.   MDM  Musculoskeletal back pain. No indication for imaging today. She is sent home with prescriptions for naproxen, Percocet, and Flexeril.      I personally performed the services described in this documentation, which was scribed in my presence. The recorded information has been reviewed and considered.     Delora Fuel, MD [id number]

## 2011-06-03 ENCOUNTER — Emergency Department (HOSPITAL_COMMUNITY)
Admission: EM | Admit: 2011-06-03 | Discharge: 2011-06-03 | Disposition: A | Discharge status: Other Acute Inpatient Hospital | Payer: 59 | Source: Home / Self Care | Attending: Emergency Medicine | Admitting: Emergency Medicine

## 2011-06-03 ENCOUNTER — Inpatient Hospital Stay (HOSPITAL_COMMUNITY)
Admission: AD | Admit: 2011-06-03 | Discharge: 2011-06-09 | DRG: 897 | Disposition: A | Discharge status: Home / Self Care | Payer: 59 | Source: Ambulatory Visit | Attending: Psychiatry | Admitting: Psychiatry

## 2011-06-03 ENCOUNTER — Encounter (HOSPITAL_COMMUNITY): Payer: Self-pay | Admitting: *Deleted

## 2011-06-03 DIAGNOSIS — F1994 Other psychoactive substance use, unspecified with psychoactive substance-induced mood disorder: Secondary | ICD-10-CM | POA: Diagnosis present

## 2011-06-03 DIAGNOSIS — R7401 Elevation of levels of liver transaminase levels: Secondary | ICD-10-CM | POA: Diagnosis present

## 2011-06-03 DIAGNOSIS — R7402 Elevation of levels of lactic acid dehydrogenase (LDH): Secondary | ICD-10-CM | POA: Diagnosis present

## 2011-06-03 DIAGNOSIS — F1990 Other psychoactive substance use, unspecified, uncomplicated: Secondary | ICD-10-CM

## 2011-06-03 DIAGNOSIS — F1920 Other psychoactive substance dependence, uncomplicated: Secondary | ICD-10-CM | POA: Insufficient documentation

## 2011-06-03 DIAGNOSIS — I10 Essential (primary) hypertension: Secondary | ICD-10-CM | POA: Diagnosis present

## 2011-06-03 DIAGNOSIS — E871 Hypo-osmolality and hyponatremia: Secondary | ICD-10-CM | POA: Diagnosis present

## 2011-06-03 DIAGNOSIS — Z79899 Other long term (current) drug therapy: Secondary | ICD-10-CM

## 2011-06-03 DIAGNOSIS — F1910 Other psychoactive substance abuse, uncomplicated: Secondary | ICD-10-CM | POA: Insufficient documentation

## 2011-06-03 DIAGNOSIS — F17200 Nicotine dependence, unspecified, uncomplicated: Secondary | ICD-10-CM | POA: Insufficient documentation

## 2011-06-03 DIAGNOSIS — F1310 Sedative, hypnotic or anxiolytic abuse, uncomplicated: Secondary | ICD-10-CM | POA: Diagnosis present

## 2011-06-03 DIAGNOSIS — F1120 Opioid dependence, uncomplicated: Secondary | ICD-10-CM | POA: Diagnosis present

## 2011-06-03 DIAGNOSIS — F1210 Cannabis abuse, uncomplicated: Secondary | ICD-10-CM | POA: Diagnosis present

## 2011-06-03 DIAGNOSIS — F3289 Other specified depressive episodes: Secondary | ICD-10-CM | POA: Insufficient documentation

## 2011-06-03 DIAGNOSIS — F19939 Other psychoactive substance use, unspecified with withdrawal, unspecified: Principal | ICD-10-CM | POA: Diagnosis present

## 2011-06-03 DIAGNOSIS — G8929 Other chronic pain: Secondary | ICD-10-CM | POA: Diagnosis present

## 2011-06-03 DIAGNOSIS — F329 Major depressive disorder, single episode, unspecified: Secondary | ICD-10-CM

## 2011-06-03 DIAGNOSIS — E119 Type 2 diabetes mellitus without complications: Secondary | ICD-10-CM | POA: Diagnosis present

## 2011-06-03 DIAGNOSIS — IMO0002 Reserved for concepts with insufficient information to code with codable children: Secondary | ICD-10-CM | POA: Diagnosis present

## 2011-06-03 DIAGNOSIS — E282 Polycystic ovarian syndrome: Secondary | ICD-10-CM | POA: Diagnosis present

## 2011-06-03 DIAGNOSIS — F1410 Cocaine abuse, uncomplicated: Secondary | ICD-10-CM | POA: Diagnosis present

## 2011-06-03 DIAGNOSIS — R000 Tachycardia, unspecified: Secondary | ICD-10-CM | POA: Insufficient documentation

## 2011-06-03 HISTORY — DX: Reserved for concepts with insufficient information to code with codable children: IMO0002

## 2011-06-03 LAB — URINALYSIS, ROUTINE W REFLEX MICROSCOPIC
Nitrite: NEGATIVE
Protein, ur: NEGATIVE mg/dL
Specific Gravity, Urine: >1.03 — ABNORMAL HIGH (ref 1.005–1.030)
Urobilinogen, UA: 0.2 mg/dL (ref 0.0–1.0)

## 2011-06-03 LAB — COMPREHENSIVE METABOLIC PANEL
BUN: 6 mg/dL (ref 6–23)
Calcium: 9.6 mg/dL (ref 8.4–10.5)
GFR calc Af Amer: >90 mL/min (ref >90)
GFR calc non Af Amer: >90 mL/min (ref >90)
Glucose, Bld: 225 mg/dL — ABNORMAL HIGH (ref 70–99)
Total Protein: 8.8 g/dL — ABNORMAL HIGH (ref 6.0–8.3)

## 2011-06-03 LAB — DIFFERENTIAL
Eosinophils Absolute: 0.1 10*3/uL (ref 0.0–0.7)
Eosinophils Relative: 1 % (ref 0–5)
Lymphs Abs: 1.9 10*3/uL (ref 0.7–4.0)
Monocytes Absolute: 0.4 10*3/uL (ref 0.1–1.0)
Monocytes Relative: 5 % (ref 3–12)

## 2011-06-03 LAB — RAPID URINE DRUG SCREEN, HOSP PERFORMED
Barbiturates: NOT DETECTED
Cocaine: POSITIVE — AB
Opiates: POSITIVE — AB

## 2011-06-03 LAB — CBC
HCT: 39.5 % (ref 36.0–46.0)
Hemoglobin: 13.1 g/dL (ref 12.0–15.0)
MCH: 30.1 pg (ref 26.0–34.0)
MCV: 90.8 fL (ref 78.0–100.0)
RBC: 4.35 MIL/uL (ref 3.87–5.11)

## 2011-06-03 LAB — ETHANOL: Alcohol, Ethyl (B): <11 mg/dL (ref 0–11)

## 2011-06-03 MED ORDER — CHLORDIAZEPOXIDE HCL 25 MG PO CAPS: 25.0000 mg | ORAL_CAPSULE | Freq: Every day | ORAL | Status: DC

## 2011-06-03 MED ORDER — CHLORDIAZEPOXIDE HCL 25 MG PO CAPS: 25.0000 mg | ORAL_CAPSULE | ORAL | Status: DC

## 2011-06-03 MED ORDER — THIAMINE HCL 100 MG/ML IJ SOLN
100.0000 mg | Freq: Once | INTRAMUSCULAR | Status: AC
  Administered 2011-06-03: 100 mg via INTRAMUSCULAR

## 2011-06-03 MED ORDER — VITAMIN B-1 100 MG PO TABS
100.0000 mg | ORAL_TABLET | Freq: Every day | ORAL | Status: DC
  Administered 2011-06-04 – 2011-06-09 (×6): 100 mg via ORAL
  Filled 2011-06-03 (×7): qty 1

## 2011-06-03 MED ORDER — HYDROXYZINE HCL 25 MG PO TABS: 25.0000 mg | ORAL_TABLET | Freq: Four times a day (QID) | ORAL | Status: DC | PRN

## 2011-06-03 MED ORDER — NICOTINE 21 MG/24HR TD PT24
21.0000 mg | MEDICATED_PATCH | Freq: Every day | TRANSDERMAL | Status: DC
  Administered 2011-06-04 – 2011-06-09 (×6): 21 mg via TRANSDERMAL
  Filled 2011-06-03 (×8): qty 1

## 2011-06-03 MED ORDER — LORAZEPAM 1 MG PO TABS
1.0000 mg | ORAL_TABLET | Freq: Three times a day (TID) | ORAL | Status: DC | PRN
  Administered 2011-06-03: 1 mg via ORAL
  Filled 2011-06-03 (×2): qty 1

## 2011-06-03 MED ORDER — METHOCARBAMOL 500 MG PO TABS: 500.0000 mg | ORAL_TABLET | Freq: Three times a day (TID) | ORAL | Status: DC | PRN

## 2011-06-03 MED ORDER — CLONIDINE HCL 0.1 MG PO TABS
0.1000 mg | ORAL_TABLET | ORAL | Status: AC
  Administered 2011-06-06 – 2011-06-07 (×4): 0.1 mg via ORAL
  Filled 2011-06-03 (×4): qty 1

## 2011-06-03 MED ORDER — CLONIDINE HCL 0.1 MG PO TABS
0.1000 mg | ORAL_TABLET | Freq: Four times a day (QID) | ORAL | Status: AC
  Administered 2011-06-03 – 2011-06-06 (×10): 0.1 mg via ORAL
  Filled 2011-06-03 (×12): qty 1

## 2011-06-03 MED ORDER — ALUM & MAG HYDROXIDE-SIMETH 200-200-20 MG/5ML PO SUSP
30.0000 mL | ORAL | Status: DC | PRN
  Administered 2011-06-07: 30 mL via ORAL

## 2011-06-03 MED ORDER — ONDANSETRON 4 MG PO TBDP: 4.0000 mg | ORAL_TABLET | Freq: Four times a day (QID) | ORAL | Status: DC | PRN

## 2011-06-03 MED ORDER — LOPERAMIDE HCL 2 MG PO CAPS
2.0000 mg | ORAL_CAPSULE | ORAL | Status: AC | PRN
  Administered 2011-06-04: 2 mg via ORAL
  Filled 2011-06-03: qty 1

## 2011-06-03 MED ORDER — DICYCLOMINE HCL 20 MG PO TABS
20.0000 mg | ORAL_TABLET | ORAL | Status: AC | PRN
  Administered 2011-06-04 – 2011-06-08 (×11): 20 mg via ORAL
  Filled 2011-06-03 (×11): qty 1

## 2011-06-03 MED ORDER — NAPROXEN 250 MG PO TABS: 500.0000 mg | ORAL_TABLET | Freq: Two times a day (BID) | ORAL | Status: DC | PRN

## 2011-06-03 MED ORDER — ACETAMINOPHEN 325 MG PO TABS
650.0000 mg | ORAL_TABLET | Freq: Four times a day (QID) | ORAL | Status: DC | PRN
  Administered 2011-06-04: 650 mg via ORAL

## 2011-06-03 MED ORDER — INSULIN ASPART 100 UNIT/ML ~~LOC~~ SOLN
0.0000 [IU] | Freq: Every day | SUBCUTANEOUS | Status: DC
  Administered 2011-06-04 – 2011-06-05 (×2): 3 [IU] via SUBCUTANEOUS
  Administered 2011-06-06 – 2011-06-08 (×3): 2 [IU] via SUBCUTANEOUS

## 2011-06-03 MED ORDER — MAGNESIUM HYDROXIDE 400 MG/5ML PO SUSP
30.0000 mL | Freq: Every day | ORAL | Status: DC | PRN
  Administered 2011-06-08: 30 mL via ORAL

## 2011-06-03 MED ORDER — ZOLPIDEM TARTRATE 5 MG PO TABS: 10.0000 mg | ORAL_TABLET | Freq: Every evening | ORAL | Status: DC | PRN

## 2011-06-03 MED ORDER — INSULIN ASPART 100 UNIT/ML ~~LOC~~ SOLN
0.0000 [IU] | Freq: Three times a day (TID) | SUBCUTANEOUS | Status: DC
  Administered 2011-06-04: 2 [IU] via SUBCUTANEOUS
  Administered 2011-06-04 (×2): 5 [IU] via SUBCUTANEOUS
  Administered 2011-06-05: 7 [IU] via SUBCUTANEOUS
  Administered 2011-06-05: 2 [IU] via SUBCUTANEOUS
  Administered 2011-06-05: 5 [IU] via SUBCUTANEOUS
  Administered 2011-06-06: 3 [IU] via SUBCUTANEOUS
  Administered 2011-06-06: 5 [IU] via SUBCUTANEOUS
  Administered 2011-06-06: 7 [IU] via SUBCUTANEOUS
  Administered 2011-06-07: 17:00:00 via SUBCUTANEOUS
  Administered 2011-06-07: 5 [IU] via SUBCUTANEOUS
  Administered 2011-06-07: 3 [IU] via SUBCUTANEOUS
  Administered 2011-06-08: 2 [IU] via SUBCUTANEOUS
  Administered 2011-06-08: 3 [IU] via SUBCUTANEOUS
  Administered 2011-06-08: 2 [IU] via SUBCUTANEOUS
  Administered 2011-06-09 (×2): 3 [IU] via SUBCUTANEOUS

## 2011-06-03 MED ORDER — ONDANSETRON HCL 4 MG PO TABS: 4.0000 mg | ORAL_TABLET | Freq: Three times a day (TID) | ORAL | Status: DC | PRN

## 2011-06-03 MED ORDER — ACETAMINOPHEN 325 MG PO TABS
650.0000 mg | ORAL_TABLET | ORAL | Status: DC | PRN
Start: 2011-06-03 — End: 2011-06-03
  Administered 2011-06-03: 650 mg via ORAL
  Filled 2011-06-03: qty 2

## 2011-06-03 MED ORDER — NICOTINE 21 MG/24HR TD PT24
21.0000 mg | MEDICATED_PATCH | Freq: Every day | TRANSDERMAL | Status: DC
  Administered 2011-06-03: 21 mg via TRANSDERMAL
  Filled 2011-06-03: qty 1

## 2011-06-03 MED ORDER — METHOCARBAMOL 500 MG PO TABS
500.0000 mg | ORAL_TABLET | Freq: Three times a day (TID) | ORAL | Status: AC | PRN
  Administered 2011-06-04 – 2011-06-08 (×6): 500 mg via ORAL
  Filled 2011-06-03 (×6): qty 1

## 2011-06-03 MED ORDER — CHLORDIAZEPOXIDE HCL 25 MG PO CAPS: 25.0000 mg | ORAL_CAPSULE | Freq: Three times a day (TID) | ORAL | Status: DC

## 2011-06-03 MED ORDER — LOPERAMIDE HCL 2 MG PO CAPS: 2.0000 mg | ORAL_CAPSULE | ORAL | Status: DC | PRN

## 2011-06-03 MED ORDER — ALUM & MAG HYDROXIDE-SIMETH 200-200-20 MG/5ML PO SUSP: 30.0000 mL | ORAL | Status: DC | PRN

## 2011-06-03 MED ORDER — ADULT MULTIVITAMIN W/MINERALS CH
1.0000 | ORAL_TABLET | Freq: Every day | ORAL | Status: DC
  Administered 2011-06-04 – 2011-06-09 (×6): 1 via ORAL
  Filled 2011-06-03 (×7): qty 1

## 2011-06-03 MED ORDER — ONDANSETRON 4 MG PO TBDP
4.0000 mg | ORAL_TABLET | Freq: Four times a day (QID) | ORAL | Status: AC | PRN
  Administered 2011-06-05: 4 mg via ORAL
  Filled 2011-06-03: qty 1

## 2011-06-03 MED ORDER — CLONIDINE HCL 0.1 MG PO TABS
0.1000 mg | ORAL_TABLET | Freq: Every day | ORAL | Status: DC
  Administered 2011-06-09: 0.1 mg via ORAL
  Filled 2011-06-03 (×2): qty 1

## 2011-06-03 MED ORDER — HYDROXYZINE HCL 25 MG PO TABS
25.0000 mg | ORAL_TABLET | Freq: Four times a day (QID) | ORAL | Status: AC | PRN
  Administered 2011-06-05 – 2011-06-08 (×4): 25 mg via ORAL
  Filled 2011-06-03: qty 1

## 2011-06-03 MED ORDER — CHLORDIAZEPOXIDE HCL 25 MG PO CAPS
25.0000 mg | ORAL_CAPSULE | Freq: Four times a day (QID) | ORAL | Status: AC | PRN
  Administered 2011-06-05 – 2011-06-06 (×5): 25 mg via ORAL
  Filled 2011-06-03 (×5): qty 1

## 2011-06-03 MED ORDER — IBUPROFEN 400 MG PO TABS: 600.0000 mg | ORAL_TABLET | Freq: Three times a day (TID) | ORAL | Status: DC | PRN

## 2011-06-03 MED ORDER — NAPROXEN 500 MG PO TABS
500.0000 mg | ORAL_TABLET | Freq: Two times a day (BID) | ORAL | Status: AC | PRN
  Administered 2011-06-04 – 2011-06-08 (×9): 500 mg via ORAL
  Filled 2011-06-03 (×9): qty 1

## 2011-06-03 MED ORDER — CHLORDIAZEPOXIDE HCL 25 MG PO CAPS
25.0000 mg | ORAL_CAPSULE | Freq: Four times a day (QID) | ORAL | Status: DC
  Administered 2011-06-03 – 2011-06-04 (×3): 25 mg via ORAL
  Filled 2011-06-03 (×3): qty 1

## 2011-06-03 MED ORDER — DICYCLOMINE HCL 10 MG PO CAPS: 20.0000 mg | ORAL_CAPSULE | ORAL | Status: DC | PRN

## 2011-06-03 NOTE — ED Provider Notes (Cosign Needed)
History   This chart was scribed for Janice Norrie, MD by Kathreen Cornfield. The patient was seen in room APA19/APA19 and the patient's care was started at 1:49 PM     CSN: [id number]  Arrival date & time 06/03/11  1317   First MD Initiated Contact with Patient 06/03/11 1332      Chief Complaint  Patient presents with  . 70.1     (Consider location/radiation/quality/duration/timing/severity/associated sxs/prior treatment) HPI  Jamie Bell is a 41 y.o. female who presents to the Emergency Department complaining of opiate addiction.   The pt states she has came in for help for opiates. The pt informs the EDP that on a given day she takes 5-12 roxycodone 15-30 mg tablets. She gets them off the street.  The pt states she has been injecting for almost three years. Pt informs EDP that she has taken pills today, 3-4 hours ago. Pt has been on suboxone which caused headaches, but has never been admitted to a psychiatric hospital or to rehab or detox. Pt relates she has been abusing narcotics since 2001 after having neck surgery x1 and back surgery x 3.    Pt has hx of being seen for similar symptoms in the ED at [facility] and she was told to follow up with [facility] which she hasn't done. Pt denies SI, HI.   Patient is very tearful and states she hasn't come before because she is embarrassed about her hirsutism that she has from her polycystic ovary disease   Pt does not have a PCP.  Neurosurgeon is Dr Saintclair Halsted  Past Medical History  Diagnosis Date  . Diabetes mellitus borderline, no meds started   . Chronic back pain   . Hypertension   . PCOS (polycystic ovarian syndrome)   . Depression   . DDD (degenerative disc disease)     Past Surgical History  Procedure Date  . Back surgery   . Cholecystectomy   . Neck surgery   . Carpal tunnel release       History  Substance Use Topics  . Smoking status: Current Everyday Smoker -- 1.5 packs/day    Types: Cigarettes  . Smokeless tobacco: Not on  file   Comment: pain pills  . Alcohol Use: No  Homeless, lives with friends Disability for back pain  OB History    Grav Para Term Preterm Abortions TAB SAB Ect Mult Living                  Review of Systems  All other systems reviewed and are negative.    10 Systems reviewed and all are negative for acute change except as noted in the HPI.    Allergies  Ciprofloxacin  Home Medications     BP 160/89  Pulse 109  Temp(Src) 98.4 F (36.9 C) (Oral)  Resp 20  Ht 5' 5"  (1.651 m)  Wt 195 lb (88.451 kg)  BMI 32.45 kg/m2  SpO2 97%  LMP 06/03/2011  Vital signs normal except borderline hypertension, tachycardia   Physical Exam  Nursing note and vitals reviewed. Constitutional: She is oriented to person, place, and time. She appears well-developed and well-nourished. No distress.  HENT:  Head: Normocephalic and atraumatic.  Right Ear: External ear normal.  Left Ear: External ear normal.  Nose: Nose normal.  Mouth/Throat: Oropharynx is clear and moist.       Balding in the upper scalp. Hirsutism.   Eyes: Conjunctivae and EOM are normal. Pupils are equal, round, and reactive  to light.  Neck: Normal range of motion. Neck supple.  Cardiovascular: Normal rate, regular rhythm, normal heart sounds and intact distal pulses.  Exam reveals no gallop and no friction rub.   No murmur heard. Pulmonary/Chest: Effort normal and breath sounds normal. No respiratory distress. She has no wheezes. She exhibits no tenderness.  Abdominal: Soft. Bowel sounds are normal. She exhibits no distension. There is no tenderness. There is no rebound and no guarding.  Musculoskeletal: Normal range of motion. She exhibits no edema and no tenderness.  Neurological: She is alert and oriented to person, place, and time.  Skin: Skin is warm and dry.       Track marks on her arms.   Psychiatric: Her behavior is normal. Thought content normal.       Tearful    ED Course  Procedures (including  critical care time)  1:58PM- EDP at bedside discusses treatment plan.   15:11 Yolanda, ACT has evaluated patient and is going to try to get her admitted to [facility] or [facility]  Patient does have mild hyperglycemia, she also has polycystic ovaries, metformin would be diabetic medication of choice if diet doesn't control her CBG which would treat both illnesses.  DIAGNOSTIC STUDIES: Oxygen Saturation is 97% on room air, normal by my interpretation.    COORDINATION OF CARE:   Results for orders placed during the hospital encounter of 06/03/11  CBC      Component Value Range   WBC 8.9  4.0 - 10.5 (K/uL)   RBC 4.35  3.87 - 5.11 (MIL/uL)   Hemoglobin 13.1  12.0 - 15.0 (g/dL)   HCT 39.5  36.0 - 46.0 (%)   MCV 90.8  78.0 - 100.0 (fL)   MCH 30.1  26.0 - 34.0 (pg)   MCHC 33.2  30.0 - 36.0 (g/dL)   RDW 13.2  11.5 - 15.5 (%)   Platelets 171  150 - 400 (K/uL)  DIFFERENTIAL      Component Value Range   Neutrophils Relative 73  43 - 77 (%)   Neutro Abs 6.5  1.7 - 7.7 (K/uL)   Lymphocytes Relative 21  12 - 46 (%)   Lymphs Abs 1.9  0.7 - 4.0 (K/uL)   Monocytes Relative 5  3 - 12 (%)   Monocytes Absolute 0.4  0.1 - 1.0 (K/uL)   Eosinophils Relative 1  0 - 5 (%)   Eosinophils Absolute 0.1  0.0 - 0.7 (K/uL)   Basophils Relative 0  0 - 1 (%)   Basophils Absolute 0.0  0.0 - 0.1 (K/uL)  COMPREHENSIVE METABOLIC PANEL      Component Value Range   Sodium 133 (*) 135 - 145 (mEq/L)   Potassium 3.8  3.5 - 5.1 (mEq/L)   Chloride 96  96 - 112 (mEq/L)   CO2 27  19 - 32 (mEq/L)   Glucose, Bld 225 (*) 70 - 99 (mg/dL)   BUN 6  6 - 23 (mg/dL)   Creatinine, Ser 0.61  0.50 - 1.10 (mg/dL)   Calcium 9.6  8.4 - 10.5 (mg/dL)   Total Protein 8.8 (*) 6.0 - 8.3 (g/dL)   Albumin 3.6  3.5 - 5.2 (g/dL)   AST 71 (*) 0 - 37 (U/L)   ALT 57 (*) 0 - 35 (U/L)   Alkaline Phosphatase 150 (*) 39 - 117 (U/L)   Total Bilirubin 0.5  0.3 - 1.2 (mg/dL)   GFR calc non Af Amer >90  >90 (mL/min)   GFR calc Af  Amer >90  >90  (mL/min)  ETHANOL      Component Value Range   Alcohol, Ethyl (B) <11  0 - 11 (mg/dL)  URINALYSIS, ROUTINE W REFLEX MICROSCOPIC      Component Value Range   Color, Urine YELLOW  YELLOW    APPearance CLEAR  CLEAR    Specific Gravity, Urine >1.030 (*) 1.005 - 1.030    pH 5.5  5.0 - 8.0    Glucose, UA 500 (*) NEGATIVE (mg/dL)   Hgb urine dipstick NEGATIVE  NEGATIVE    Bilirubin Urine SMALL (*) NEGATIVE    Ketones, ur NEGATIVE  NEGATIVE (mg/dL)   Protein, ur NEGATIVE  NEGATIVE (mg/dL)   Urobilinogen, UA 0.2  0.0 - 1.0 (mg/dL)   Nitrite NEGATIVE  NEGATIVE    Leukocytes, UA NEGATIVE  NEGATIVE   URINE RAPID DRUG SCREEN (HOSP PERFORMED)      Component Value Range   Opiates POSITIVE (*) NONE DETECTED    Cocaine POSITIVE (*) NONE DETECTED    Benzodiazepines POSITIVE (*) NONE DETECTED    Amphetamines NONE DETECTED  NONE DETECTED    Tetrahydrocannabinol POSITIVE (*) NONE DETECTED    Barbiturates NONE DETECTED  NONE DETECTED    Laboratory interpretation all normal except hyperglycemia, elevation of LFT's   1. Narcotic addiction   2. IV drug user   3. Depression     Plan admission for detox/depression  Rolland Porter, MD, FACEP   MDM    I personally performed the services described in this documentation, which was scribed in my presence. The recorded information has been reviewed and considered. Rolland Porter, MD, Abram Sander   Janice Norrie, MD 06/05/11 [phone]

## 2011-06-03 NOTE — ED Notes (Signed)
[facility] in ED to transfer patient.

## 2011-06-03 NOTE — ED Notes (Signed)
Patient has had Ativan earlier. MD is aware. Has not ordered additional Ativan. Patient made aware.
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
## 2011-06-03 NOTE — ED Notes (Signed)
Pt states she needs help detox from oxycodone, occasional use of cocaine and nerve pills. Last used oxycodone 2 hours PTA. Pt tearful at triage.

## 2011-06-03 NOTE — BH Assessment (Signed)
Assessment Note  Patient has been accepted by Horris Latino NP to Dr. Lockie Pares; room 301.2    Sabra Heck [place] 06/03/2011 5:24 PM

## 2011-06-03 NOTE — ED Notes (Signed)
Nira Conn, RN, from [facility] given report.

## 2011-06-03 NOTE — ED Notes (Signed)
Patient requesting medication for 6\10 headache and Ativan for anxiety.

## 2011-06-03 NOTE — ED Notes (Signed)
Report called to Romeo Rabon, RN at [facility].

## 2011-06-04 DIAGNOSIS — F1120 Opioid dependence, uncomplicated: Secondary | ICD-10-CM | POA: Diagnosis present

## 2011-06-04 LAB — GLUCOSE, CAPILLARY
Glucose-Capillary: 175 mg/dL — ABNORMAL HIGH (ref 70–99)
Glucose-Capillary: 296 mg/dL — ABNORMAL HIGH (ref 70–99)
Glucose-Capillary: 292 mg/dL — ABNORMAL HIGH (ref 70–99)

## 2011-06-04 MED ORDER — CITALOPRAM HYDROBROMIDE 20 MG PO TABS
20.0000 mg | ORAL_TABLET | Freq: Every day | ORAL | Status: DC
  Administered 2011-06-04 – 2011-06-08 (×5): 20 mg via ORAL
  Filled 2011-06-04 (×6): qty 1

## 2011-06-04 MED ORDER — TRAZODONE HCL 50 MG PO TABS
50.0000 mg | ORAL_TABLET | Freq: Every day | ORAL | Status: DC
  Administered 2011-06-04: 50 mg via ORAL
  Filled 2011-06-04 (×2): qty 1

## 2011-06-04 NOTE — H&P (Signed)
Pt seen and evaluated upon admission with physician extender.  See full H&P/PA.  Completed Admission Suicide Risk Assessment.  See orders.  Pt agreeable with plan.  Discussed with team.
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
## 2011-06-04 NOTE — Progress Notes (Signed)
In dayroom talking with peers on approach. Has been interacting well with peers. Calm and cooperative with assessment. No acute distress noted. States she has has a rough day r/t WD symptoms. States she knows what to expect but it doesn't make it any easier. Support and encouragement provided. C/O leg cramps and loose stools. PRNS provided for both. Denies SI/HI/AVH and contracts for safety. POC and medications for the shift reviewed and understanding verbalized. Safety has been maintained with Q15 minute observation. Will f/u response to prns and continue current POC.

## 2011-06-04 NOTE — Discharge Planning (Signed)
Found patient in bed, asleep.  Woke her.  Engageable.  States she wants to go to rehab after here.  [place] resident.  Explained [facility] was the only option since [facility] is perpetually full.  She agrees with plan.

## 2011-06-04 NOTE — Tx Team (Signed)
Initial Interdisciplinary Treatment Plan  PATIENT STRENGTHS: (choose at least two) Ability for insight Average or above average intelligence Capable of independent living General fund of knowledge Motivation for treatment/growth  PATIENT STRESSORS: Financial difficulties Health problems Marital or family conflict Substance abuse   PROBLEM LIST: Problem List/Patient Goals Date to be addressed Date deferred Reason deferred Estimated date of resolution  Depression-separated from husband, homeless      Substance abuse-opiates                                                 DISCHARGE CRITERIA:  Ability to meet basic life and health needs Adequate post-discharge living arrangements Improved stabilization in mood, thinking, and/or behavior Motivation to continue treatment in a less acute level of care Safe-care adequate arrangements made Verbal commitment to aftercare and medication compliance Withdrawal symptoms are absent or subacute and managed without 24-hour nursing intervention  PRELIMINARY DISCHARGE PLAN: Attend aftercare/continuing care group Attend 12-step recovery group Outpatient therapy Placement in alternative living arrangements  PATIENT/FAMIILY INVOLVEMENT: This treatment plan has been presented to and reviewed with the patient, TAFFY DELCONTE, and/or family member.  The patient and family have been given the opportunity to ask questions and make suggestions.  Junius Finner [facility] 06/04/2011, 12:23 AM

## 2011-06-04 NOTE — [facility] Suicide Risk Assessment (Signed)
Suicide Risk Assessment  Admission Assessment      Demographic factors:  See chart.  Current Mental Status:  Patient seen and evaluated. Chart reviewed. Patient stated that her mood was "depressed". Her affect was mood congruent and anxious. She denied any current thoughts of self injurious behavior, suicidal ideation or homicidal ideation. There were no auditory or visual hallucinations, paranoia, delusional thought processes, or mania noted.  Thought process was linear and goal directed.  No psychomotor agitation or retardation was noted. Speech was normal rate, tone and volume. Eye contact was good. Judgment and insight are fair.  Patient has been up and engaged on the unit.  No acute safety concerns reported from team.    Loss Factors: Loss of significant relationship;Decline in physical health;Legal issues;Financial problems / change in socioeconomic status  Historical Factors: Denied Hx SI/SIB  Risk Reduction Factors: Religious beliefs about death; interest in Tx; willingness to take meds  CLINICAL FACTORS: Opioid Dependence & W/D; Benzodiazepine and Cocaine Abuse; Cannabis Use; PCO; r/o DM; Transaminitis; Hyponatremia; SIMD  COGNITIVE FEATURES THAT CONTRIBUTE TO RISK: limited insight.  SUICIDE RISK: Patient is currently viewed as a low risk of harm to herself and others in light of her history and risk factors. There are no acute safety concerns at this time.  Pt presenting for detox and crisis stabilization.    PLAN OF CARE: Pt admitted for crisis stabilization, detox and treatment.  Initiated citalopram for c/o anxiety and depressive s/s and trazodone for sleep.  Standard clonidine taper for opioid w/d as well.  Please see orders.   Medications reviewed with pt and medication education provided.  Pt seen and evaluated with physician extender, please see H&P.  Will continue q15 minute checks per unit protocol.  No clinical indication for one on one level of observation at this time.  Pt  contracting for safety.  Mental health treatment, medication management and continued sobriety will mitigate against the potential increased risk of harm to self and/or others.  Discussed the importance of recovery with pt, as well as, tools to move forward in a healthy & safe manner.  Pt agreeable with the plan.  Discussed with the team.   Cheryll Cockayne 06/04/2011, 1:14 PM

## 2011-06-04 NOTE — Progress Notes (Signed)
Patient's mother, Mahrosh Donnell, at [phone] was contacted for collateral information.  Mother was aware of pt's hospitalization and request for detox and is very supportive of patient taking this step.  Mother had no additional concerns regarding patient's care.   Mobile Crisis [facility] Service was explained to Ms [place] and number given.   Lyla Glassing 06/04/2011 6:31 PM

## 2011-06-04 NOTE — Progress Notes (Addendum)
[facility] Group Notes:  (Counselor/Nursing/MHT/Case Management/Adjunct)  06/04/2011 6:38 PM  Type of Therapy:  Group Therapy  Participation Level:  Active  Participation Quality:  Attentive and hesitant  Affect:  Anxious  Cognitive:  Alert and Oriented  Insight:  Limited  Engagement in Group:  Good  Engagement in Therapy:  Good  Modes of Intervention:  Clarification, Socialization and Support  Summary of Progress/Problems: Jamie Bell attended her first group session of stay and participated well although she was a bit anxious.  She shared several things that add to her life being out of balance which included: "sole focus every damn day on drugs and where I'm going to get them", "guilt and shame over my actions", and "loss of self".  Others in group agreed with patient and one praised for even having the nerve to share those things aloud.    Lyla Glassing 06/04/2011, 6:44 PM  [place] Group Notes:  (Counselor/Nursing/MHT/Case Management/Adjunct)  06/04/2011 6:45 PM  Type of Therapy:  Group Therapy at 1:15  Participation Level:  Did Not Attend   Lyla Glassing 06/04/2011, 6:45 PM

## 2011-06-04 NOTE — Progress Notes (Signed)
Pt has been achey with cramps and a headache in her bed this morning. Pt's affect is sad and she interacts appropriately with staff. Offered support and encouragement. Gave prn medications as needed. Safety maintained on unit.
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
## 2011-06-05 DIAGNOSIS — F1994 Other psychoactive substance use, unspecified with psychoactive substance-induced mood disorder: Secondary | ICD-10-CM

## 2011-06-05 DIAGNOSIS — F1210 Cannabis abuse, uncomplicated: Secondary | ICD-10-CM

## 2011-06-05 DIAGNOSIS — F1420 Cocaine dependence, uncomplicated: Secondary | ICD-10-CM

## 2011-06-05 DIAGNOSIS — F1320 Sedative, hypnotic or anxiolytic dependence, uncomplicated: Secondary | ICD-10-CM

## 2011-06-05 LAB — GLUCOSE, CAPILLARY: Glucose-Capillary: 263 mg/dL — ABNORMAL HIGH (ref 70–99)

## 2011-06-05 LAB — HEMOGLOBIN A1C: Mean Plasma Glucose: 192 mg/dL — ABNORMAL HIGH (ref <117)

## 2011-06-05 LAB — RPR: RPR Ser Ql: NONREACTIVE

## 2011-06-05 MED ORDER — TRAZODONE HCL 100 MG PO TABS
100.0000 mg | ORAL_TABLET | Freq: Every day | ORAL | Status: DC
  Administered 2011-06-05 – 2011-06-06 (×2): 100 mg via ORAL
  Filled 2011-06-05 (×5): qty 1

## 2011-06-05 NOTE — Progress Notes (Signed)
Patient quiet in discharge planning group this AM. States that he is not feeling well and that he had a hard time sleeping. Patient continues to want referral to [facility]. No case management needs voiced.

## 2011-06-05 NOTE — Progress Notes (Signed)
Patient ID: Jamie Bell, female   DOB: 05-14-1970, 41 y.o.   MRN: [id number]   Patient trying to sleep but unable. Patient restless and moving around in bed and taking showers to help relax her. Had some diarrhea and achy pains earlier tonight. Librium given at this time due to possible withdrawals keeping her awake. Staff will continue to monitor.

## 2011-06-05 NOTE — Progress Notes (Signed)
[facility] MD Progress Note  06/05/2011 2:35 PM  S/O: Patient seen and evaluated in treatment team. Chart reviewed. Patient stated that her mood was "not good". Her affect was mood congruent and anxious. She denied any current thoughts of self injurious behavior, suicidal ideation or homicidal ideation. There were no auditory or visual hallucinations, paranoia, delusional thought processes, or mania noted. Thought process was linear and goal directed. No psychomotor agitation or retardation was noted. Speech was normal rate, tone and volume. Eye contact was good. Judgment and insight are fair. Patient has been up and engaged on the unit. No acute safety concerns reported from team. Nevertheless, pt in sig opiate w/d and did not have more then 4.25 hours of sleep last night.  Sleep:  Number of Hours: 4.25    Vital Signs:Blood pressure 114/81, pulse 86, temperature 97.9 F (36.6 C), temperature source Oral, resp. rate 16, height 5' 4"  (1.626 m), weight 89.812 kg (198 lb), last menstrual period 06/03/2011.  Lab Results:  Results for orders placed during the hospital encounter of 06/03/11 (from the past 48 hour(s))  GLUCOSE, CAPILLARY     Status: Abnormal   Collection Time   06/04/11  6:18 AM      Component Value Range Comment   Glucose-Capillary 175 (*) 70 - 99 (mg/dL)   GLUCOSE, CAPILLARY     Status: Abnormal   Collection Time   06/04/11 12:06 PM      Component Value Range Comment   Glucose-Capillary 296 (*) 70 - 99 (mg/dL)    Comment 1 Notify RN     GLUCOSE, CAPILLARY     Status: Abnormal   Collection Time   06/04/11  5:30 PM      Component Value Range Comment   Glucose-Capillary 278 (*) 70 - 99 (mg/dL)   GLUCOSE, CAPILLARY     Status: Abnormal   Collection Time   06/04/11  9:32 PM      Component Value Range Comment   Glucose-Capillary 292 (*) 70 - 99 (mg/dL)    Comment 1 Notify RN     GLUCOSE, CAPILLARY     Status: Abnormal   Collection Time   06/05/11  5:56 AM      Component Value Range Comment    Glucose-Capillary 185 (*) 70 - 99 (mg/dL)    Comment 1 Notify RN     GLUCOSE, CAPILLARY     Status: Abnormal   Collection Time   06/05/11 11:44 AM      Component Value Range Comment   Glucose-Capillary 263 (*) 70 - 99 (mg/dL)     Physical Findings: CIWA:  CIWA-Ar Total: 4  COWS:  COWS Total Score: 8   Plan: Opioid Dependence & W/D; Benzodiazepine and Cocaine Abuse; Cannabis Use; PCO; DM; Transaminitis; Hyponatremia; SIMD  Increased Trazodone for sleep. Initiated citalopram yesterday.  No SEs reported. Continue current meds and TP.  Medication education completed.  Pros, cons, risks, potential side effects and benefits were discussed with pt.  Pt agreeable with the plan.  See orders.  Discussed with team.  Cheryll Cockayne 06/05/2011, 2:35 PM

## 2011-06-05 NOTE — Progress Notes (Addendum)
I left a voicemail on UR Patrice's voicemail at 12:49 PM on 06/05/11 with concurrent clinical. Foster Simpson RN MS EdS 06/05/2011  12:49 PM

## 2011-06-05 NOTE — Progress Notes (Signed)
[facility] Notes:  (Counselor/Nursing/MHT/Case Management/Adjunct)  06/05/2011 3:41 PM  Type of Therapy:  Group Therapy at 11  Participation Level:  Active  Participation Quality:  Attentive and Sharing  Affect:  Depressed  Cognitive:  Alert and Oriented  Insight:  Limited  Engagement in Group:  Good  Modes of Intervention:  Education, Socialization and Support  Summary of Progress/Problems:  Gayle attended educational  group session and was attentive to discussion on Post Acute Withdrawal Syndrome (PAWS).  She was willing to ask questions and shared her concerns "I can't believe it lasts so long" "How can I avoid this as these are things that make me want to use."  Group problem solved with patient.   Lyla Glassing 06/05/2011, 3:41 PM  [facility] Notes:  (Counselor/Nursing/MHT/Case Management/Adjunct)  06/05/2011 3:46 PM  Type of Therapy:  Group Therapy  Participation Level:  Active  Participation Quality:  Appropriate and Sharing  Affect:  Depressed and Flat  Cognitive:  Oriented  Insight:  Limited  Engagement in Group:  Good  Engagement in Therapy:  Limited  Modes of Intervention:  Clarification, Problem-solving and Support  Summary of Progress/Problems:  Patient was attentive to discussion on early recovery and what to expect in early recovery.  Edd Fabian shared the most difficult thing for her will be loneliness as most of her friends and family use.  Other's in group provided encouragement and ideas for patient to get involved in recovery community and/or volunteer.    Lyla Glassing 06/05/2011, 3:46 PM

## 2011-06-05 NOTE — Progress Notes (Signed)
Inpatient Diabetes Program Recommendations  AACE/ADA: New Consensus Statement on Inpatient Glycemic Control (2009)  Target Ranges:  Prepandial:   less than 140 mg/dL      Peak postprandial:   less than 180 mg/dL (1-2 hours)      Critically ill patients:  140 - 180 mg/dL   Reason for Visit: Hyperglycemia   Results for ROBBI, SPELLS (MRN [id number]) as of 06/05/2011 15:15  Ref. Range 06/03/2011 13:57  Glucose Latest Range: 70-99 mg/dL 225 (H)  Results for BERRY, GALLACHER (MRN [id number]) as of 06/05/2011 15:15  Ref. Range 06/04/2011 12:06 06/04/2011 17:30 06/04/2011 21:32 06/05/2011 05:56 06/05/2011 11:44  Glucose-Capillary Latest Range: 70-99 mg/dL 296 (H) 278 (H) 292 (H) 185 (H) 263 (H)   Inpatient Diabetes Program Recommendations Correction (SSI): Increase Novolog to moderate tidwc and hs HgbA1C: HgbA1C to assess glycemic control prior to hospitalization (last one 10/25/08 - 7.4%)  Note: Will follow.  No diabetes home meds listed.

## 2011-06-05 NOTE — Tx Team (Signed)
Interdisciplinary Treatment Plan Update (Adult)  Date: @5 /10/2011  Time Reviewed: @10 :32 AM   Progress in Treatment: Attending groups: Yes Participating in groups:  Yes Taking medication as prescribed: Yes Tolerating medication:  Robaxin and Bentyle given for withdrawal symptoms Family/Significant other contact made:  Yes, with mother Patient understands diagnosis:  Yes Discussing patient identified problems/goals with staff:  Yes Medical problems stabilized or resolved:  No, withdrawal medications provided for withdrawal symptoms. Denies suicidal/homicidal ideation: Yes Issues/concerns per patient self-inventory:  None identified Other: N/A  New problem(s) identified: None Identified  Reason for Continuation of Hospitalization: Depression Medication stabilization Withdrawal symptoms  Interventions implemented related to continuation of hospitalization: mood stabilization, medication monitoring and adjustment, group therapy and psycho education, safety checks q 15 mins  Additional comments: patient encouraged to request PRN medications to relieve withdrawal symptoms.  Estimated length of stay: 3-5 days  Discharge Plan: Wants to go to [facility] for further treatment.  New goal(s): none  Review of initial/current patient goals per problem list:    1.  Goal(s): Address substance use  Met:  No  Target date: by discharge  As evidenced by: completing detox protocol and refer to appropriate treatment  2.  Goal (s): Reduce depressive symptoms  Met:  No  Target date: by discharge  As evidenced by: Reducing depression from a 10 to a 3 as reported by pt.      Attendees: Patient:  Jamie Bell 06/05/2011 10:33 AM    Family:     Physician:  Cheryll Cockayne, DO 06/05/2011 10:30 AM  Nursing:    Case Manager:  Arna Snipe, RN 06/05/2011 10:30 AM  Counselor:  Frutoso Chase, [place] 06/05/2011 10:30 AM  Other: Desma Paganini RN 06/05/2011 10:37 AM    Other:       Other:     Other:      Scribe for Treatment Team:   Theophile Harvie 06/05/2011 10:30 AM

## 2011-06-06 DIAGNOSIS — F1120 Opioid dependence, uncomplicated: Secondary | ICD-10-CM

## 2011-06-06 LAB — GLUCOSE, CAPILLARY: Glucose-Capillary: 306 mg/dL — ABNORMAL HIGH (ref 70–99)

## 2011-06-06 MED ORDER — SUCRALFATE 1 GM/10ML PO SUSP
1.0000 g | Freq: Three times a day (TID) | ORAL | Status: DC
  Administered 2011-06-06 – 2011-06-09 (×13): 1 g via ORAL
  Filled 2011-06-06 (×16): qty 10

## 2011-06-06 NOTE — Progress Notes (Signed)
Pt is a pleasant 41 yr old female that reports the withdrawal symptoms of anxiety, muscle discomfort and abdominal cramps. Pt anxiety was relieved with vistaril. Pt cbg was 278 this evening. Writer observed pt self-administered 3 units of Novolog into her left lower abdomen. Pt observed interacting with others appropriately and attending group. Continued support and availability as needed has been extended to this pt. Pt safety remains with q34mn checks.

## 2011-06-06 NOTE — Progress Notes (Signed)
Pt anxious on approach, requesting cramp and anxiety medication.  Pt denies SI/HI/hallucinations.  Still some stomach issues, but states carafate seems to be helping.  Denies SI/HI/hallucinations.  Positive for group this evening, interacting appropriately within milieu.  Support and encouragement offered, will continue to monitor.

## 2011-06-06 NOTE — Progress Notes (Signed)
[facility] MD Progress Note  06/06/2011 2:36 PM  Diagnosis:   Axis I: Substance Abuse and Substance Induced Mood Disorder Axis II: Deferred Axis III:  Past Medical History  Diagnosis Date  . Diabetes mellitus   . Chronic back pain   . Hypertension   . PCOS (polycystic ovarian syndrome)   . Depression   . DDD (degenerative disc disease)    Subjective: Jamie Bell is lying in bed complaining of withdrawal symptoms. She reports that she is having stomach pain, leg pain, and restless legs. She endorses that she has been able to eat and sleep. She denies any suicidal or homicidal ideation. She denies any auditory or visual hallucinations.  ADL's:  Intact  Sleep: Good  Appetite:  Good  Suicidal Ideation:  Patient denies any thought plan or intent Homicidal Ideation:  Patient denies any thought plan or intent  AEB (as evidenced by):  Mental Status Examination/Evaluation: Objective:  Appearance: Disheveled  Eye Contact::  Fair  Speech:  Clear and Coherent  Volume:  Normal  Mood:  Depressed and Hopeless  Affect:  Congruent  Thought Process:  Intact  Orientation:  Full  Thought Content:  WDL  Suicidal Thoughts:  No  Homicidal Thoughts:  No  Memory:  Immediate;   Good  Judgement:  Fair  Insight:  Fair  Psychomotor Activity:  Decreased  Concentration:  Good  Recall:  Good  Akathisia:  No  Handed:    AIMS (if indicated):     Assets:  Desire for Improvement  Sleep:  Number of Hours: 6    Vital Signs:Blood pressure 113/70, pulse 73, temperature 97.7 F (36.5 C), temperature source Oral, resp. rate 16, height 5' 4"  (1.626 m), weight 89.812 kg (198 lb), last menstrual period 06/03/2011. Current Medications: Current Facility-Administered Medications  Medication Dose Route Frequency Provider Last Rate Last Dose  . alum & mag hydroxide-simeth (MAALOX/MYLANTA) 200-200-20 MG/5ML suspension 30 mL  30 mL Oral Q4H PRN Jimmye Norman, PA-C      . chlordiazePOXIDE (LIBRIUM) capsule 25 mg  25 mg Oral  Q6H PRN Jimmye Norman, PA-C   25 mg at 06/06/11 0750  . citalopram (CELEXA) tablet 20 mg  20 mg Oral Daily Alyson Kuroski-Mazzei, DO   20 mg at 06/06/11 0746  . cloNIDine (CATAPRES) tablet 0.1 mg  0.1 mg Oral QID Jimmye Norman, PA-C   0.1 mg at 06/06/11 0745   Followed by  . cloNIDine (CATAPRES) tablet 0.1 mg  0.1 mg Oral BH-qamhs Jimmye Norman, PA-C   0.1 mg at 06/06/11 9323   Followed by  . cloNIDine (CATAPRES) tablet 0.1 mg  0.1 mg Oral QAC breakfast Jimmye Norman, PA-C      . dicyclomine (BENTYL) tablet 20 mg  20 mg Oral Q4H PRN Jimmye Norman, PA-C   20 mg at 06/06/11 0750  . hydrOXYzine (ATARAX/VISTARIL) tablet 25 mg  25 mg Oral Q6H PRN Jimmye Norman, PA-C   25 mg at 06/05/11 1548  . insulin aspart (novoLOG) injection 0-5 Units  0-5 Units Subcutaneous QHS Jimmye Norman, PA-C   3 Units at 06/05/11 2244  . insulin aspart (novoLOG) injection 0-9 Units  0-9 Units Subcutaneous TID WC Jimmye Norman, PA-C   7 Units at 06/06/11 1137  . loperamide (IMODIUM) capsule 2-4 mg  2-4 mg Oral PRN Jimmye Norman, PA-C   2 mg at 06/04/11 2049  . magnesium hydroxide (MILK OF MAGNESIA) suspension 30 mL  30 mL Oral Daily PRN Jimmye Norman, PA-C      . methocarbamol (ROBAXIN)  tablet 500 mg  500 mg Oral Q8H PRN Jimmye Norman, PA-C   500 mg at 06/05/11 1548  . mulitivitamin with minerals tablet 1 tablet  1 tablet Oral Daily Jimmye Norman, PA-C   1 tablet at 06/06/11 0743  . naproxen (NAPROSYN) tablet 500 mg  500 mg Oral BID PRN Jimmye Norman, PA-C   500 mg at 06/05/11 2242  . nicotine (NICODERM CQ - dosed in mg/24 hours) patch 21 mg  21 mg Transdermal Q0600 Jimmye Norman, PA-C   21 mg at 06/06/11 0740  . ondansetron (ZOFRAN-ODT) disintegrating tablet 4 mg  4 mg Oral Q6H PRN Jimmye Norman, PA-C   4 mg at 06/05/11 1732  . sucralfate (CARAFATE) 1 GM/10ML suspension 1 g  1 g Oral TID WC & HS Mickie D. Adams, PA   1 g at 06/06/11 1017  . thiamine (VITAMIN B-1) tablet 100 mg  100 mg Oral Daily Jimmye Norman, PA-C   100 mg at 06/06/11 0743  . traZODone (DESYREL) tablet 100 mg  100 mg  Oral QHS Alyson Kuroski-Mazzei, DO   100 mg at 06/05/11 2245    Lab Results:  Results for orders placed during the hospital encounter of 06/03/11 (from the past 48 hour(s))  GLUCOSE, CAPILLARY     Status: Abnormal   Collection Time   06/04/11  5:30 PM      Component Value Range Comment   Glucose-Capillary 278 (*) 70 - 99 (mg/dL)   GLUCOSE, CAPILLARY     Status: Abnormal   Collection Time   06/04/11  9:32 PM      Component Value Range Comment   Glucose-Capillary 292 (*) 70 - 99 (mg/dL)    Comment 1 Notify RN     GLUCOSE, CAPILLARY     Status: Abnormal   Collection Time   06/05/11  5:56 AM      Component Value Range Comment   Glucose-Capillary 185 (*) 70 - 99 (mg/dL)    Comment 1 Notify RN     HEMOGLOBIN A1C     Status: Abnormal   Collection Time   06/05/11  6:45 AM      Component Value Range Comment   Hemoglobin A1C 8.3 (*) <5.7 (%)    Mean Plasma Glucose 192 (*) <117 (mg/dL)   RPR     Status: Normal   Collection Time   06/05/11  6:45 AM      Component Value Range Comment   RPR NON REACTIVE  NON REACTIVE    HIV ANTIBODY (ROUTINE TESTING)     Status: Normal   Collection Time   06/05/11  6:45 AM      Component Value Range Comment   HIV NON REACTIVE  NON REACTIVE    GLUCOSE, CAPILLARY     Status: Abnormal   Collection Time   06/05/11 11:44 AM      Component Value Range Comment   Glucose-Capillary 263 (*) 70 - 99 (mg/dL)   GLUCOSE, CAPILLARY     Status: Abnormal   Collection Time   06/05/11  5:11 PM      Component Value Range Comment   Glucose-Capillary 350 (*) 70 - 99 (mg/dL)   GLUCOSE, CAPILLARY     Status: Abnormal   Collection Time   06/05/11  9:00 PM      Component Value Range Comment   Glucose-Capillary 278 (*) 70 - 99 (mg/dL)   GLUCOSE, CAPILLARY     Status: Abnormal   Collection Time   06/06/11 11:34 AM  Component Value Range Comment   Glucose-Capillary 306 (*) 70 - 99 (mg/dL)     Physical Findings: AIMS:  , ,  ,  ,    CIWA:  CIWA-Ar Total: 2  COWS:  COWS  Total Score: 4   Treatment Plan Summary: Daily contact with patient to assess and evaluate symptoms and progress in treatment Medication management  Plan: We will continue medical detox, and research followup options.  Josselyne Onofrio 06/06/2011, 2:36 PM

## 2011-06-06 NOTE — Progress Notes (Signed)
Patient ID: Jamie Bell, female   DOB: 1970/03/23, 41 y.o.   MRN: [id number] 06-06-11 nursing shift note: pt has been rather anxious today. rn has given her support throughout the day/ on her inventory sheet she wrote she sleep fair, appetite good, energy low, attention improving, w/d symptoms are tremors, cravings and tremor. She doesn't show overt s/s of withdrawal. Denied any si. Physical problems in the last 24 hrs are leg pain with a pain of 6. She plan to not go around the same people when she rtns home. She doesn't not see a problems staying on meds when she is discharged. rn will monitor and q 15 min checks continue.

## 2011-06-06 NOTE — Progress Notes (Signed)
Patient ID: Jamie Bell, female   DOB: 1970/07/17, 41 y.o.   MRN: [id number]    [facility] Group Notes:  (Counselor/Nursing/MHT/Case Management/Adjunct)  06/06/2011 1:15 PM  Type of Therapy:  Group Therapy, Dance/Movement Therapy   Participation Level:  Did Not Attend   Dominica Severin

## 2011-06-07 LAB — GLUCOSE, CAPILLARY
Glucose-Capillary: 247 mg/dL — ABNORMAL HIGH (ref 70–99)
Glucose-Capillary: 214 mg/dL — ABNORMAL HIGH (ref 70–99)

## 2011-06-07 MED ORDER — TRAZODONE HCL 50 MG PO TABS
150.0000 mg | ORAL_TABLET | Freq: Every day | ORAL | Status: DC
  Administered 2011-06-07 – 2011-06-08 (×2): 150 mg via ORAL
  Filled 2011-06-07 (×3): qty 1

## 2011-06-07 NOTE — Progress Notes (Signed)
Patient ID: SPENSER CONG, female   DOB: 1970-08-28, 41 y.o.   MRN: [id number]  [facility] Notes:  (Counselor/Nursing/MHT/Case Management/Adjunct)  06/07/2011 1:15 PM  Type of Therapy:  Group Therapy, Dance/Movement Therapy   Participation Level:  Active  Participation Quality:  Appropriate  Affect:  Appropriate  Cognitive:  Appropriate  Insight:  Limited  Engagement in Group:  Good  Engagement in Therapy:  Good  Modes of Intervention:  Clarification, Problem-solving, Role-play, Socialization and Support  Summary of Progress/Problems:  Therapist discussed the meaning of supports and asked group what does support mean to them.  Group discussed the difference between positive and negative support. Pt. stated that "My husband is 21 days clean and I am in the process of trying to become clean. I'm anxious because I know that once I leave here, I may go back to my old habits'".  Pt. is aware of supports systems; however, pt. is  Unsure if she is able to maintain sobriety  along with her husband.  Pt. Is aware that she will need long term treatment and outside supports.     Dominica Severin

## 2011-06-07 NOTE — Progress Notes (Signed)
Patient ID: Jamie Bell, female   DOB: 1970-10-19, 41 y.o.   MRN: [id number] Pt. attended and participated in aftercare planning group. Pt. accepted information on suicide prevention, warning signs to look for with suicide and crisis line numbers to use. The pt. agreed to call crisis line numbers if having warning signs or having thoughts of suicide. Pt. listed their current anxiety level as medium and depression as medium.  Pt. accepted an NA meeting schedule.

## 2011-06-07 NOTE — Progress Notes (Signed)
Patient ID: Jamie Bell, female   DOB: 11/24/1970, 41 y.o.   MRN: [id number] 06-07-11 nursing shift note: pt has required prns for anxiety and indigestion(gas) today. Her anxiety has decreased as the day went forward. On her inventory sheet she wrote she slept fair, appetite improving, energy level low, attention improving.  W/d symptoms have been tremor and cravings. Denied any si or self harm. Physical problems in the last 24 hrs have been pain.when she is discharged she stated she will " stay away from most people". She doesn't see any problems staying on medications after discharge. rn will monitor and wq 15 minute checks continue.

## 2011-06-07 NOTE — Progress Notes (Signed)
Pt pleasant and appropriate on approach.  Denies SI/HI/hallucinations.  Pt's only complaint is gas, for which she received Maalox.  Pt was positive for group, and is interacting appropriately on unit.  Support and encouragement offered, will continue to monitor.

## 2011-06-07 NOTE — Progress Notes (Signed)
[facility] MD Progress Note  06/07/2011 1:42 PM  Diagnosis:   Axis I: Substance Abuse Axis II: Deferred Axis III:  Past Medical History  Diagnosis Date  . Diabetes mellitus   . Chronic back pain   . Hypertension   . PCOS (polycystic ovarian syndrome)   . Depression   . DDD (degenerative disc disease)    Subjective: Jamie Bell is up and participating in group today. She reports that she is feeling significantly better although she continues to have stomach cramps. She is taking the Bentyl for that. She denies any cravings. She reports that she slept well last night because she had Librium. She asked if she could continue to take that, but she did not understand that it was a benzodiazepine. She denies any suicidal or homicidal ideation. She denies any auditory or visual hallucinations. She is interested in attending a residential treatment facility, and states that she would like to leave directly from this facility to go into treatment.  ADL's:  Intact  Sleep: Good  Appetite:  Good  Suicidal Ideation:  Patient denies any thought, plan, or intent Homicidal Ideation:  Patient denies any thought, plan, or intent  AEB (as evidenced by):  Mental Status Examination/Evaluation: Objective:  Appearance: Casual and Disheveled  Eye Contact::  Good  Speech:  Clear and Coherent  Volume:  Normal  Mood:  Euthymic  Affect:  Appropriate  Thought Process:  Goal Directed  Orientation:  Full  Thought Content:  WDL  Suicidal Thoughts:  No  Homicidal Thoughts:  No  Memory:  Immediate;   Good  Judgement:  Good  Insight:  Fair  Psychomotor Activity:  Normal  Concentration:  Good  Recall:  Good  Akathisia:  No  Handed:    AIMS (if indicated):     Assets:  Desire for Improvement Resilience  Sleep:  Number of Hours: 6    Vital Signs:Blood pressure 125/80, pulse 71, temperature 98.1 F (36.7 C), temperature source Oral, resp. rate 16, height 5' 4"  (1.626 m), weight 89.812 kg (198 lb), last menstrual  period 06/03/2011. Current Medications: Current Facility-Administered Medications  Medication Dose Route Frequency Provider Last Rate Last Dose  . alum & mag hydroxide-simeth (MAALOX/MYLANTA) 200-200-20 MG/5ML suspension 30 mL  30 mL Oral Q4H PRN Jimmye Norman, PA-C   30 mL at 06/07/11 1251  . chlordiazePOXIDE (LIBRIUM) capsule 25 mg  25 mg Oral Q6H PRN Jimmye Norman, PA-C   25 mg at 06/06/11 2149  . citalopram (CELEXA) tablet 20 mg  20 mg Oral Daily Alyson Kuroski-Mazzei, DO   20 mg at 06/07/11 0731  . cloNIDine (CATAPRES) tablet 0.1 mg  0.1 mg Oral BH-qamhs Jimmye Norman, PA-C   0.1 mg at 06/07/11 0732   Followed by  . cloNIDine (CATAPRES) tablet 0.1 mg  0.1 mg Oral QAC breakfast Jimmye Norman, PA-C      . dicyclomine (BENTYL) tablet 20 mg  20 mg Oral Q4H PRN Jimmye Norman, PA-C   20 mg at 06/06/11 1953  . hydrOXYzine (ATARAX/VISTARIL) tablet 25 mg  25 mg Oral Q6H PRN Jimmye Norman, PA-C   25 mg at 06/07/11 1145  . insulin aspart (novoLOG) injection 0-5 Units  0-5 Units Subcutaneous QHS Jimmye Norman, PA-C   2 Units at 06/06/11 2150  . insulin aspart (novoLOG) injection 0-9 Units  0-9 Units Subcutaneous TID WC Jimmye Norman, PA-C   5 Units at 06/07/11 1144  . loperamide (IMODIUM) capsule 2-4 mg  2-4 mg Oral PRN Jimmye Norman, PA-C   2  mg at 06/04/11 2049  . magnesium hydroxide (MILK OF MAGNESIA) suspension 30 mL  30 mL Oral Daily PRN Jimmye Norman, PA-C      . methocarbamol (ROBAXIN) tablet 500 mg  500 mg Oral Q8H PRN Jimmye Norman, PA-C   500 mg at 06/07/11 0601  . mulitivitamin with minerals tablet 1 tablet  1 tablet Oral Daily Jimmye Norman, PA-C   1 tablet at 06/07/11 0731  . naproxen (NAPROSYN) tablet 500 mg  500 mg Oral BID PRN Jimmye Norman, PA-C   500 mg at 06/07/11 0601  . nicotine (NICODERM CQ - dosed in mg/24 hours) patch 21 mg  21 mg Transdermal Q0600 Jimmye Norman, PA-C   21 mg at 06/07/11 0602  . ondansetron (ZOFRAN-ODT) disintegrating tablet 4 mg  4 mg Oral Q6H PRN Jimmye Norman, PA-C   4 mg at 06/05/11 1732  . sucralfate (CARAFATE) 1  GM/10ML suspension 1 g  1 g Oral TID WC & HS Mickie D. Adams, PA   1 g at 06/07/11 1141  . thiamine (VITAMIN B-1) tablet 100 mg  100 mg Oral Daily Jimmye Norman, PA-C   100 mg at 06/07/11 0731  . traZODone (DESYREL) tablet 150 mg  150 mg Oral QHS Jimmye Norman, PA-C      . DISCONTD: traZODone (DESYREL) tablet 100 mg  100 mg Oral QHS Alyson Kuroski-Mazzei, DO   100 mg at 06/06/11 2150    Lab Results:  Results for orders placed during the hospital encounter of 06/03/11 (from the past 48 hour(s))  GLUCOSE, CAPILLARY     Status: Abnormal   Collection Time   06/05/11  5:11 PM      Component Value Range Comment   Glucose-Capillary 350 (*) 70 - 99 (mg/dL)   GLUCOSE, CAPILLARY     Status: Abnormal   Collection Time   06/05/11  9:00 PM      Component Value Range Comment   Glucose-Capillary 278 (*) 70 - 99 (mg/dL)   GLUCOSE, CAPILLARY     Status: Abnormal   Collection Time   06/06/11 11:34 AM      Component Value Range Comment   Glucose-Capillary 306 (*) 70 - 99 (mg/dL)   GLUCOSE, CAPILLARY     Status: Abnormal   Collection Time   06/06/11  9:17 PM      Component Value Range Comment   Glucose-Capillary 233 (*) 70 - 99 (mg/dL)    Comment 1 Notify RN      Comment 2 Documented in Chart     GLUCOSE, CAPILLARY     Status: Abnormal   Collection Time   06/07/11  6:06 AM      Component Value Range Comment   Glucose-Capillary 217 (*) 70 - 99 (mg/dL)   GLUCOSE, CAPILLARY     Status: Abnormal   Collection Time   06/07/11 11:35 AM      Component Value Range Comment   Glucose-Capillary 262 (*) 70 - 99 (mg/dL)     Physical Findings: AIMS:  , ,  ,  ,    CIWA:  CIWA-Ar Total: 1  COWS:  COWS Total Score: 4   Treatment Plan Summary: Daily contact with patient to assess and evaluate symptoms and progress in treatment Medication management  Plan: We will complete her safe medical detox. We will research resources for followup and refer.  Jamie Bell 06/07/2011, 1:42 PM

## 2011-06-08 LAB — GLUCOSE, CAPILLARY
Glucose-Capillary: 218 mg/dL — ABNORMAL HIGH (ref 70–99)
Glucose-Capillary: 292 mg/dL — ABNORMAL HIGH (ref 70–99)
Glucose-Capillary: 306 mg/dL — ABNORMAL HIGH (ref 70–99)
Glucose-Capillary: 173 mg/dL — ABNORMAL HIGH (ref 70–99)
Glucose-Capillary: 166 mg/dL — ABNORMAL HIGH (ref 70–99)
Glucose-Capillary: 230 mg/dL — ABNORMAL HIGH (ref 70–99)

## 2011-06-08 LAB — COMPREHENSIVE METABOLIC PANEL
ALT: 63 U/L — ABNORMAL HIGH (ref 0–35)
BUN: 13 mg/dL (ref 6–23)
CO2: 26 mEq/L (ref 19–32)
Calcium: 9.8 mg/dL (ref 8.4–10.5)
Creatinine, Ser: 0.65 mg/dL (ref 0.50–1.10)
GFR calc Af Amer: >90 mL/min (ref >90)
GFR calc non Af Amer: >90 mL/min (ref >90)
Glucose, Bld: 244 mg/dL — ABNORMAL HIGH (ref 70–99)
Total Protein: 8.8 g/dL — ABNORMAL HIGH (ref 6.0–8.3)

## 2011-06-08 LAB — HEPATITIS PANEL, ACUTE
Hep A IgM: NEGATIVE
Hep B C IgM: NEGATIVE

## 2011-06-08 MED ORDER — CITALOPRAM HYDROBROMIDE 20 MG PO TABS
30.0000 mg | ORAL_TABLET | Freq: Every day | ORAL | Status: DC
  Administered 2011-06-09: 30 mg via ORAL
  Filled 2011-06-08 (×2): qty 1

## 2011-06-08 MED ORDER — METFORMIN HCL 500 MG PO TABS
500.0000 mg | ORAL_TABLET | Freq: Every day | ORAL | Status: DC
  Administered 2011-06-08 – 2011-06-09 (×2): 500 mg via ORAL
  Filled 2011-06-08 (×3): qty 1

## 2011-06-08 NOTE — Progress Notes (Signed)
Pt has been up and active in the milieu today.  She denied any depression or hopelessness on her self-inventory but did admit to anxiety of 5 d/t her uncertainty of where she can go from here.  She really does not want to return home where she will be exposed to the drugs and alcohol.  She is wanting to go to [facility] and was told in treatment team this morning that ahe may have to go home and call [facility] every morning to check on beds available then she would have to go to [facility] to get the process started.  Pt did voice understanding.  She has had several prn meds today (see daily cares and pian tab).  She will have a CMET drawn tonight and has started on metformin today.  Her celexa is increasing to 30 mg in the morning.

## 2011-06-08 NOTE — Progress Notes (Signed)
[facility] Group Notes:  (Counselor/Nursing/MHT/Case Management/Adjunct)  06/08/2011 12:29 PM  Type of Therapy:  Group Therapy at 11  Participation Level:  Active  Participation Quality:  Appropriate, Attentive, Sharing and Supportive  Affect:  Appropriate  Cognitive:  Alert and Oriented  Insight:  Improving  Engagement in Group:  Good  Engagement in Therapy:  Good  Modes of Intervention:  Clarification, Socialization and Support  Summary of Progress/Problems:  Group discussion focused on what patient's see as their own obstacles to recovery.  Patient shares belief that too much time on her hands (as I spent most of my day figuring out how to get and then using)  will be difficult to deal with due to bordom. Others in group offered empathy and problem solving included: volunteering, exercise, work, Social research officer, government. Edd Fabian shared support for other group member that may be discharged today due to lact of coverage and available resources for discharge.  Gayle's empathy was recognized and held in light of positive attribute.    Lyla Glassing 06/08/2011, 12:37 PM  [place] Group Notes:  (Counselor/Nursing/MHT/Case Management/Adjunct)  06/08/2011 2:10PM  Type of Therapy:  Group Therapy at 1:15  Participation Level:  Minimal  Participation Quality:  Attentive  Affect:  Appropriate  Cognitive:  Appropriate  Engagement in Group:  Limited  Modes of Intervention:  Education and Support  Summary of Progress/Problems: Patient attended small portion (perhaps 10 minutes) group presentation by M[employer] of  [employer] ([place]). Edd Fabian was attentive and appropriate during session.     Lyla Glassing 06/08/2011, 2:15 PM

## 2011-06-08 NOTE — Progress Notes (Addendum)
[facility] MD Progress Note  06/08/2011 1:49 PM  S/O: Patient seen and evaluated in treatment team. Chart reviewed. Patient stated that her mood was "a whole lot better". Her affect was mood congruent and euthymic. She denied any current thoughts of self injurious behavior, suicidal ideation or homicidal ideation. There were no auditory or visual hallucinations, paranoia, delusional thought processes, or mania noted. Thought process was linear and goal directed. No psychomotor agitation or retardation was noted. Speech was normal rate, tone and volume. Eye contact was good. Judgment and insight are fair. Patient has been up and engaged on the unit. No acute safety concerns reported from team. No beds at [facility] today.  Pt on metformin in past and willing to restart at this time in light of continued hyperglycemia.  Sleep:  Number of Hours: 6    Vital Signs:Blood pressure 94/63, pulse 91, temperature 97.6 F (36.4 C), temperature source Oral, resp. rate 20, height 5' 4"  (1.626 m), weight 89.812 kg (198 lb), last menstrual period 06/03/2011.  Lab Results:  Results for orders placed during the hospital encounter of 06/03/11 (from the past 48 hour(s))  GLUCOSE, CAPILLARY     Status: Abnormal   Collection Time   06/06/11  4:33 PM      Component Value Range Comment   Glucose-Capillary 306 (*) 70 - 99 (mg/dL)   GLUCOSE, CAPILLARY     Status: Abnormal   Collection Time   06/06/11  4:36 PM      Component Value Range Comment   Glucose-Capillary 292 (*) 70 - 99 (mg/dL)   GLUCOSE, CAPILLARY     Status: Abnormal   Collection Time   06/06/11  9:17 PM      Component Value Range Comment   Glucose-Capillary 233 (*) 70 - 99 (mg/dL)    Comment 1 Notify RN      Comment 2 Documented in Chart     GLUCOSE, CAPILLARY     Status: Abnormal   Collection Time   06/07/11  6:06 AM      Component Value Range Comment   Glucose-Capillary 217 (*) 70 - 99 (mg/dL)   GLUCOSE, CAPILLARY     Status: Abnormal   Collection Time   06/07/11 11:35 AM      Component Value Range Comment   Glucose-Capillary 262 (*) 70 - 99 (mg/dL)   GLUCOSE, CAPILLARY     Status: Abnormal   Collection Time   06/07/11  4:43 PM      Component Value Range Comment   Glucose-Capillary 247 (*) 70 - 99 (mg/dL)   GLUCOSE, CAPILLARY     Status: Abnormal   Collection Time   06/07/11  9:18 PM      Component Value Range Comment   Glucose-Capillary 214 (*) 70 - 99 (mg/dL)   GLUCOSE, CAPILLARY     Status: Abnormal   Collection Time   06/08/11  6:06 AM      Component Value Range Comment   Glucose-Capillary 173 (*) 70 - 99 (mg/dL)   GLUCOSE, CAPILLARY     Status: Abnormal   Collection Time   06/08/11 12:06 PM      Component Value Range Comment   Glucose-Capillary 227 (*) 70 - 99 (mg/dL)    Comment 1 Notify RN       Physical Findings: CIWA:  CIWA-Ar Total: 2  COWS:  COWS Total Score: 4   Plan: Opioid Dependence & W/D; Benzodiazepine and Cocaine Abuse; Cannabis Use; PCO; DM; Transaminitis; Hyponatremia; SIMD  Continue current meds  and TP with addition of metformin for better control of hyperglycemia and f/u CMP to follow Na level. Additionally, will increase citalopram to 30 mg qd for better control of continued depressive and anxiety s/s.  Medication education completed.  Pros, cons, risks, potential side effects and benefits were discussed with pt.  Pt agreeable with the plan.  See orders.  Discussed with team.  Hopeful placement at [facility] tomorrow.  Cheryll Cockayne 06/08/2011, 1:49 PM

## 2011-06-09 DIAGNOSIS — F411 Generalized anxiety disorder: Secondary | ICD-10-CM

## 2011-06-09 LAB — GLUCOSE, CAPILLARY: Glucose-Capillary: 203 mg/dL — ABNORMAL HIGH (ref 70–99)

## 2011-06-09 MED ORDER — SUCRALFATE 1 G PO TABS: ORAL_TABLET | ORAL | Status: DC

## 2011-06-09 MED ORDER — RANITIDINE HCL 150 MG PO TABS
150.0000 mg | ORAL_TABLET | Freq: Two times a day (BID) | ORAL | Status: DC
  Administered 2011-06-09: 150 mg via ORAL
  Filled 2011-06-09 (×3): qty 1

## 2011-06-09 MED ORDER — CITALOPRAM HYDROBROMIDE 10 MG PO TABS
30.0000 mg | ORAL_TABLET | Freq: Every day | ORAL | Status: DC
  Filled 2011-06-09 (×2): qty 42

## 2011-06-09 MED ORDER — TRAZODONE HCL 150 MG PO TABS: 150.0000 mg | ORAL_TABLET | Freq: Every day | ORAL | Status: DC

## 2011-06-09 MED ORDER — CITALOPRAM HYDROBROMIDE 10 MG PO TABS: 30.0000 mg | ORAL_TABLET | Freq: Every day | ORAL | Status: DC

## 2011-06-09 MED ORDER — RANITIDINE HCL 150 MG/10ML PO SYRP: 150.0000 mg | ORAL_SOLUTION | Freq: Two times a day (BID) | ORAL | Status: DC

## 2011-06-09 MED ORDER — METFORMIN HCL 500 MG PO TABS: 500.0000 mg | ORAL_TABLET | Freq: Every day | ORAL | Status: DC

## 2011-06-09 MED ORDER — RANITIDINE HCL 150 MG PO TABS: 150.0000 mg | ORAL_TABLET | Freq: Two times a day (BID) | ORAL | Status: DC

## 2011-06-09 MED ORDER — SUCRALFATE 1 G PO TABS
1.0000 g | ORAL_TABLET | Freq: Three times a day (TID) | ORAL | Status: DC
  Filled 2011-06-09: qty 14

## 2011-06-09 NOTE — Treatment Plan (Signed)
Interdisciplinary Treatment Plan Update (Adult)  Date: 06/09/2011  Time Reviewed: 10:10 AM   Progress in Treatment: Attending groups: Yes Participating in groups: Yes Taking medication as prescribed: Yes Tolerating medication: Yes   Family/Significant othe contact made:   Patient understands diagnosis:  Yes Discussing patient identified problems/goals with staff:  Yes Medical problems stabilized or resolved:  Yes Denies suicidal/homicidal ideation: Yes  In tx team Issues/concerns per patient self-inventory:  None noted Other:  New problem(s) identified: N/A  Reason for Continuation of Hospitalization: Other; describe D/C today  Interventions implemented related to continuation of hospitalization:   Additional comments:  Estimated length of stay: D/C today  Discharge Plan: Transfer to [facility]  New goal(s): N/A  Review of initial/current patient goals per problem list:   1.  Goal(s): Safely detox from opiates  Met:  Yes  Target date:5/13  As evidenced by: COWS score of 0, stable vitals  2.  Goal (s): Decrease depression  Met:  Yes  Target date:5/13  As evidenced by: Anelia denies depression today  3.  Goal(s): Get into rehab from here  Met:  Yes  Target date: 5/13  As evidenced [id number] of bed at [facility]  4.  Goal(s):  Met:  Yes  Target date:  As evidenced by:  Attendees: Patient:  Jamie Bell 06/09/2011 10:10 AM  Family:     Physician:  Cheryll Cockayne 06/09/2011 10:10 AM   Nursing:  Para March  06/09/2011 10:10 AM   Case Manager:  Ripley Fraise, LCSW 06/09/2011 10:10 AM   Counselor:  Frutoso Chase, [place] 06/09/2011 10:10 AM   Other:     Other:     Other:     Other:      Scribe for Treatment Team:   Roque Lias B, 06/09/2011 10:10 AM

## 2011-06-09 NOTE — Discharge Instructions (Signed)
Please take a copy of your labs with you. Please see a GI specialist Physiological scientist) within 30 days for follow-up with your lab tests.  If you are not able to follow-up with a gastroenterologist, at least try to be seen at the health department or free clinic, or your primary care physician.

## 2011-06-09 NOTE — Progress Notes (Signed)
Patient denies SI/HI and A/V hallucinations. Patient reports happy to be able to speak with husband today. Patient denies withdrawal symptoms. Patient verbalizes desire to be discharged tomorrow 06/09/2011. Support and encouragement given, patient verbalizes no needs or concerns at this time. Will continue to monitor.

## 2011-06-09 NOTE — Progress Notes (Signed)
[place] Group Notes:  (Counselor/Nursing/MHT/Case Management/Adjunct)  06/09/2011 4:01 PM  Type of Therapy:  Group Therapy  Participation Level:  None; patient only present in group for last 5 minutes   Lyla Glassing 06/09/2011, 4:01 PM

## 2011-06-09 NOTE — Progress Notes (Signed)
Pt was discharged to [facility] today.  She denied any S/I H/I or A/V hallucinations.    She was given f/u appointment, rx, sample medications, hotline info booklet, and letter provided by the case manager.  She voiced understanding to all instructions provided.  Pt was given information on smoking cessation along with 14 day supply of nicotine patches from here.

## 2011-06-09 NOTE — Progress Notes (Signed)
[facility] Case Management Discharge Plan:  Will you be returning to the same living situation after discharge: No. At discharge, do you have transportation home?:No. Do you have the ability to pay for your medications:Yes,  insurance  Interagency Information:     Release of information consent forms completed and in the chart;  Patient's signature needed at discharge.  Patient to Follow up at:  Follow-up Information    Follow up with [facility]. (Will pick you up today)    Contact information:   [phone]         Patient denies SI/HI:   Yes,  yes    Safety Planning and Suicide Prevention discussed:  Yes,  yes  Barrier to discharge identified:No.  Summary and Recommendations:   Trish Mage 06/09/2011, 10:26 AM

## 2011-06-09 NOTE — [facility] Suicide Risk Assessment (Signed)
Suicide Risk Assessment  Discharge Assessment      Demographic factors: Caucasian;Low socioeconomic status;Unemployed (homeless)  Current Mental Status Per Nursing Assessment: On Admission:   (none) At Discharge:  Pt denied any SI/HI/thoughts of self harm or acute psychiatric issues in treatment team with clinical, nursing and medical team present.  Current Mental Status Per Physician: Patient seen and evaluated. Chart reviewed. Patient stated that her mood was "anxious about leaving".  Pt nervous about transition to [place]. Her affect was mood congruent.  She was worried about "people making fun of her because of her facial hair growth." Support and education provided by team.  She denied any current thoughts of self injurious behavior, suicidal ideation or homicidal ideation. There were no auditory or visual hallucinations, paranoia, delusional thought processes, or mania noted.  Thought process was linear and goal directed.  No psychomotor agitation or retardation was noted. Speech was normal rate, tone and volume. Eye contact was good. Judgment and insight are fair.  Patient has been up and engaged on the unit.  No acute safety concerns reported from team.    Loss Factors: Loss of significant relationship;Decline in physical health;Legal issues;Financial problems / change in socioeconomic status  Historical Factors: Denied Hx SI/SIB  Risk Reduction Factors: Religious beliefs about death; interest in Tx; willingness to take meds  Continued Clinical Symptoms: poor self esteem.  Discharge Diagnoses:   AXIS I: Opioid Dependence; Benzodiazepine and Cocaine Abuse; Cannabis Use; Anxiety Disorder NOS AXIS II: Deferred AXIS III:  Transaminitis; Hyponatremia; HepC Past Medical History  Diagnosis Date  . Diabetes mellitus   . Chronic back pain   . Hypertension   . PCOS (polycystic ovarian syndrome)   . Depression   . DDD (degenerative disc disease)    AXIS IV: Moderate AXIS V:  50  Cognitive Features That Contribute To Risk: limited insight.  Suicide Risk: Patient is currently viewed as a low risk of harm to herself and others in light of her history and risk factors. There are no acute safety concerns at this time.   Plan Of Care/Follow-up recommendations: Pt seen and evaluated in treatment team. Chart reviewed.  Pt stable for and being discharged to [facility]. Pt contracting for safety and does not currently meet [place] involuntary commitment criteria for continued hospitalization against her will.  Mental health treatment, medication management and continued sobriety will mitigate against the potential increased risk of harm to self and/or others.  Discussed the importance of recovery further with pt, as well as, tools to move forward in a healthy & safe manner.  Pt agreeable with the plan.  Discussed with the team.  Please see orders, follow up appointments per AVS and full discharge summary to be completed by physician extender.  Recommend follow up with NA.  Diet: Regular.  Activity: As tolerated.     Cheryll Cockayne 06/09/2011, 11:59 AM

## 2011-06-12 NOTE — Progress Notes (Signed)
Patient Discharge Instructions:  After Visit Summary (AVS):   Faxed to:  06/12/2011 Psychiatric Admission Assessment Note:   Faxed to:  06/12/2011 Suicide Risk Assessment - Discharge Assessment:   Faxed to:  06/12/2011 Faxed/Sent to the Next Level Care provider:  06/12/2011  Faxed to [facility] @ [phone]  Jola Baptist, 06/12/2011, 11:18 AM

## 2011-06-16 NOTE — Discharge Summary (Signed)
Physician Discharge Summary Note  Patient:  Jamie Bell is an 41 y.o., female MRN:  [id number] DOB:  04-Apr-1970 Patient phone:  [phone] (home)  Patient address:   [address] [place] [address],   Date of Admission:  06/03/2011 Date of Discharge: 06/09/2011  Discharge Diagnoses:  AXIS I: Opioid Dependence; Benzodiazepine and Cocaine Abuse; Cannabis Use; Anxiety Disorder NOS  AXIS II: Deferred  AXIS III: Transaminitis; Hyponatremia; HepC  Past Medical History   Diagnosis  Date   .  Diabetes mellitus    .  Chronic back pain    .  Hypertension    .  PCOS (polycystic ovarian syndrome)    .  Depression    .  DDD (degenerative disc disease)    AXIS IV: Moderate  AXIS V: [place] [facility] Course:  First admission for [facility] who walked 5 miles to [facility] room requesting detox from opiates. 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 Since that time she has been taking opiates on Bell regular basis.  She has been using oxycodone, and Roxicodone, and injecting regularly. She reports her depression to Bell 6-7/10 of 1-10 scale if 10 is the worst symptoms. She rates anxiety 6-7/10, she denies suicidal thoughts. To control her anxiety she takes Xanax once in Bell while, last week she used 'two pieces of Bell Xanax bar.' She also occasionally uses Valium once or twice Bell week. She denies Bell history of seizures. She uses marijuana on and off. Rare use of cocaine.Please refer to the psychiatric admission note for details regarding the events and circumstances leading to admission.    She was admitted for dual diagnosis unit and detoxed uneventfully using the clonidine detox protocol. She complained of chronic acid reflux and got relief he from Carafate added to her previous home Zantac dose. Citalopram was added to address her depressive symptoms. She denied suicidal thoughts while on our unit. She tolerated the Celexa well without  side effects.   She was pleasant and cooperative with staff and peers on the unit and group therapy participation was good.Our counselors helped him identify relapse triggers, and develop Bell relapse prevention program.   Ultimately she chose to enter residential treatment and will go to the [facility] program.  Blood sugars remained in control throughout her detox stay.   Consults:  None  Discharge Vitals:   Blood pressure 110/71, pulse 111, temperature 97.9 F (36.6 C), temperature source Oral, resp. rate 16, height 5' 4"  (1.626 m), weight 89.812 kg (198 lb), last menstrual period 06/03/2011.  Mental Status Exam: See Mental Status Examination and Suicide Risk Assessment completed by Attending Physician prior to discharge.  Discharge destination:  Home  Is patient on multiple antipsychotic therapies at discharge:  No   Has Patient had three or more failed trials of antipsychotic monotherapy by history:  No  Recommended Plan for Multiple Antipsychotic Therapies: N/Bell   Medication List  As of 06/16/2011  2:16 PM   STOP taking these medications         cyclobenzaprine 10 MG tablet      ibuprofen 200 MG tablet      naproxen 500 MG tablet         TAKE these medications      Indication    citalopram 10 MG tablet   Commonly known as: CELEXA   Take 3 tablets (30 mg total) by mouth daily. For depression and anxiety  metFORMIN 500 MG tablet   Commonly known as: GLUCOPHAGE   Take 1 tablet (500 mg total) by mouth daily with breakfast. For diabetes       ranitidine 150 MG tablet   Commonly known as: ZANTAC   Take 1 tablet (150 mg total) by mouth 2 (two) times daily. For acid reflux       sucralfate 1 G tablet   Commonly known as: CARAFATE   Take one tablet four times daily for three more days, for acid reflux/gastric upset.       traZODone 150 MG tablet   Commonly known as: DESYREL   Take 1 tablet (150 mg total) by mouth at bedtime. For sleep.            Follow-up  Information    Follow up with [facility]. (Will pick you up today)    Contact information:   [phone]         Follow-up recommendations:  Activity:  unrestricted Diet:  regular  Signed: [name] Bell 06/16/2011, 2:16 PM

## 2011-07-25 ENCOUNTER — Emergency Department (HOSPITAL_COMMUNITY)
Admission: EM | Admit: 2011-07-25 | Discharge: 2011-07-25 | Disposition: A | Discharge status: Home / Self Care | Payer: Medicare Other | Attending: Emergency Medicine | Admitting: Emergency Medicine

## 2011-07-25 ENCOUNTER — Encounter (HOSPITAL_COMMUNITY): Payer: Self-pay | Admitting: Emergency Medicine

## 2011-07-25 ENCOUNTER — Emergency Department (HOSPITAL_COMMUNITY): Payer: Medicare Other

## 2011-07-25 DIAGNOSIS — E119 Type 2 diabetes mellitus without complications: Secondary | ICD-10-CM | POA: Insufficient documentation

## 2011-07-25 DIAGNOSIS — F3289 Other specified depressive episodes: Secondary | ICD-10-CM | POA: Insufficient documentation

## 2011-07-25 DIAGNOSIS — M545 Low back pain, unspecified: Secondary | ICD-10-CM | POA: Insufficient documentation

## 2011-07-25 DIAGNOSIS — F329 Major depressive disorder, single episode, unspecified: Secondary | ICD-10-CM | POA: Insufficient documentation

## 2011-07-25 DIAGNOSIS — M5431 Sciatica, right side: Secondary | ICD-10-CM

## 2011-07-25 DIAGNOSIS — M5430 Sciatica, unspecified side: Secondary | ICD-10-CM | POA: Insufficient documentation

## 2011-07-25 DIAGNOSIS — IMO0002 Reserved for concepts with insufficient information to code with codable children: Secondary | ICD-10-CM | POA: Insufficient documentation

## 2011-07-25 DIAGNOSIS — F17200 Nicotine dependence, unspecified, uncomplicated: Secondary | ICD-10-CM | POA: Insufficient documentation

## 2011-07-25 DIAGNOSIS — G8929 Other chronic pain: Secondary | ICD-10-CM | POA: Insufficient documentation

## 2011-07-25 DIAGNOSIS — I10 Essential (primary) hypertension: Secondary | ICD-10-CM | POA: Insufficient documentation

## 2011-07-25 HISTORY — DX: Sciatica, unspecified side: M54.30

## 2011-07-25 MED ORDER — HYDROCODONE-ACETAMINOPHEN 5-325 MG PO TABS
1.0000 | ORAL_TABLET | Freq: Once | ORAL | Status: AC
  Administered 2011-07-25: 1 via ORAL
  Filled 2011-07-25: qty 1

## 2011-07-25 MED ORDER — PREDNISONE 50 MG PO TABS: 50.0000 mg | ORAL_TABLET | Freq: Every day | ORAL | Status: AC

## 2011-07-25 MED ORDER — PREDNISONE 20 MG PO TABS
60.0000 mg | ORAL_TABLET | Freq: Once | ORAL | Status: AC
  Administered 2011-07-25: 60 mg via ORAL
  Filled 2011-07-25: qty 3

## 2011-07-25 MED ORDER — HYDROCODONE-ACETAMINOPHEN 5-325 MG PO TABS: 1.0000 | ORAL_TABLET | Freq: Four times a day (QID) | ORAL | Status: AC | PRN

## 2011-07-25 NOTE — ED Notes (Signed)
Patient c/o pain in right buttock that radiates down right leg since yesterday after walking. Per patient hx of sciatica in left leg, feels the same.

## 2011-07-25 NOTE — ED Provider Notes (Signed)
Medical screening examination/treatment/procedure(s) were performed by non-physician practitioner and as supervising physician I was immediately available for consultation/collaboration.   [name], MD 07/25/11 [phone]

## 2011-07-25 NOTE — ED Provider Notes (Signed)
History     CSN: [id number]  Arrival date & time 07/25/11  1634   First MD Initiated Contact with Patient 07/25/11 1655      Chief Complaint  Patient presents with  . Leg Pain    (Consider location/radiation/quality/duration/timing/severity/associated sxs/prior treatment) HPI Comments: Pt had lower back surgery several years ago by dr. Saintclair Halsted.  She has had L leg sciatica in past but since recent falls has R leg sciatica.  She intends to "try and get in to see dr. Saintclair Halsted soon".  She has her intial appt with a new PCP on July 10.  States she has taken prednisone in past without significant elevations in blood glucose.  Denies any other pain or injuries from recent falls.  Patient is a 41 y.o. female presenting with leg pain. The history is provided by the patient. No language interpreter was used.  Leg Pain  Incident onset: 3 days ago pt "walked into her spare room and tripped over her friend's child's bicycle and fell striking lower back on a bicycle part".  she also slipped and fell  in ice tea her husband spilled on the floor after "his glass broke" last PM. The quality of the pain is described as throbbing. The pain is severe. The pain has been constant since onset. Pertinent negatives include no numbness, no inability to bear weight, no loss of motion and no muscle weakness. She reports no foreign bodies present. The symptoms are aggravated by bearing weight. She has tried nothing for the symptoms.    Past Medical History  Diagnosis Date  . Diabetes mellitus   . Chronic back pain   . Hypertension   . PCOS (polycystic ovarian syndrome)   . Depression   . DDD (degenerative disc disease)   . Sciatica     Past Surgical History  Procedure Date  . Back surgery   . Cholecystectomy   . Neck surgery   . Carpal tunnel release     History reviewed. No pertinent family history.  History  Substance Use Topics  . Smoking status: Current Some Day Smoker -- 1.5 packs/day for 20 years   Types: Cigarettes    Last Attempt to Quit: 05/11/2006  . Smokeless tobacco: Never Used   Comment: pain pills  . Alcohol Use: No    OB History    Grav Para Term Preterm Abortions TAB SAB Ect Mult Living            0      Review of Systems  HENT: Negative for neck pain.   Musculoskeletal: Positive for back pain.  Neurological: Negative for weakness and numbness.  All other systems reviewed and are negative.    Allergies  Ciprofloxacin  Home Medications   Current Outpatient Rx  Name Route Sig Dispense Refill  . CITALOPRAM HYDROBROMIDE 10 MG PO TABS Oral Take 3 tablets (30 mg total) by mouth daily. For depression and anxiety 90 tablet 0  . METFORMIN HCL 500 MG PO TABS Oral Take 500 mg by mouth 2 (two) times daily. For diabetes    . RANITIDINE HCL 150 MG PO TABS Oral Take 1 tablet (150 mg total) by mouth 2 (two) times daily. For acid reflux 60 tablet 0  . TRAZODONE HCL 100 MG PO TABS Oral Take 200 mg by mouth at bedtime.    . TRAZODONE HCL 150 MG PO TABS Oral Take 1 tablet (150 mg total) by mouth at bedtime. For sleep.      BP 121/79  Pulse 90  Temp 98.5 F (36.9 C) (Oral)  Resp 16  Ht 5' 5.5" (1.664 m)  Wt 180 lb (81.647 kg)  BMI 29.50 kg/m2  SpO2 100%  LMP 05/31/2011  Physical Exam  Nursing note and vitals reviewed. Constitutional: She is oriented to person, place, and time. She appears well-developed and well-nourished. No distress.  HENT:  Head: Normocephalic and atraumatic.  Eyes: EOM are normal.  Neck: Normal range of motion.  Cardiovascular: Normal rate, regular rhythm and normal heart sounds.   Pulmonary/Chest: Effort normal and breath sounds normal.  Abdominal: Soft. She exhibits no distension. There is no tenderness.  Musculoskeletal: She exhibits tenderness.       Lumbar back: She exhibits decreased range of motion, tenderness, bony tenderness and pain. She exhibits no swelling, no edema, no deformity, no laceration, no spasm and normal pulse.        Back:  Neurological: She is alert and oriented to person, place, and time. She has normal strength. No sensory deficit. Coordination and gait normal. GCS eye subscore is 4. GCS verbal subscore is 5. GCS motor subscore is 6.  Reflex Scores:      Patellar reflexes are 2+ on the right side and 2+ on the left side.      Achilles reflexes are 2+ on the right side and 2+ on the left side. Skin: Skin is warm and dry.  Psychiatric: She has a normal mood and affect. Judgment normal.    ED Course  Procedures (including critical care time)  Labs Reviewed - No data to display No results found.   No diagnosis found.    MDM  No acute x-ray findings.  Ice. rx prednisone 50 mg, 6.  rx hydrocodone, 20.  F/u with dr. Saintclair Halsted and with your new PCP.        Jennye Boroughs, [place] 07/25/11 1818

## 2011-07-25 NOTE — Discharge Instructions (Signed)
Cryotherapy Cryotherapy means treatment with cold. Ice or gel packs can be used to reduce both pain and swelling. Ice is the most helpful within the first 24 to 48 hours after an injury or flareup from overusing a muscle or joint. Sprains, strains, spasms, burning pain, shooting pain, and aches can all be eased with ice. Ice can also be used when recovering from surgery. Ice is effective, has very few side effects, and is safe for most people to use. PRECAUTIONS  Ice is not a safe treatment option for people with:  Raynaud's phenomenon. This is a condition affecting small blood vessels in the extremities. Exposure to cold may cause your problems to return.   Cold hypersensitivity. There are many forms of cold hypersensitivity, including:   Cold urticaria. Red, itchy hives appear on the skin when the tissues begin to warm after being iced.   Cold erythema. This is a red, itchy rash caused by exposure to cold.   Cold hemoglobinuria. Red blood cells break down when the tissues begin to warm after being iced. The hemoglobin that carry oxygen are passed into the urine because they cannot combine with blood proteins fast enough.   Numbness or altered sensitivity in the area being iced.  If you have any of the following conditions, do not use ice until you have discussed cryotherapy with your caregiver:  Heart conditions, such as arrhythmia, angina, or chronic heart disease.   High blood pressure.   Healing wounds or open skin in the area being iced.   Current infections.   Rheumatoid arthritis.   Poor circulation.   Diabetes.  Ice slows the blood flow in the region it is applied. This is beneficial when trying to stop inflamed tissues from spreading irritating chemicals to surrounding tissues. However, if you expose your skin to cold temperatures for too long or without the proper protection, you can damage your skin or nerves. Watch for signs of skin damage due to cold. HOME CARE  INSTRUCTIONS Follow these tips to use ice and cold packs safely.  Place a dry or damp towel between the ice and skin. A damp towel will cool the skin more quickly, so you may need to shorten the time that the ice is used.   For a more rapid response, add gentle compression to the ice.   Ice for no more than 10 to 20 minutes at a time. The bonier the area you are icing, the less time it will take to get the benefits of ice.   Check your skin after 5 minutes to make sure there are no signs of a poor response to cold or skin damage.   Rest 20 minutes or more in between uses.   Once your skin is numb, you can end your treatment. You can test numbness by very lightly touching your skin. The touch should be so light that you do not see the skin dimple from the pressure of your fingertip. When using ice, most people will feel these normal sensations in this order: cold, burning, aching, and numbness.   Do not use ice on someone who cannot communicate their responses to pain, such as small children or people with dementia.  HOW TO MAKE AN ICE PACK Ice packs are the most common way to use ice therapy. Other methods include ice massage, ice baths, and cryo-sprays. Muscle creams that cause a cold, tingly feeling do not offer the same benefits that ice offers and should not be used as a substitute  unless recommended by your caregiver. To make an ice pack, do one of the following:  Place crushed ice or a bag of frozen vegetables in a sealable plastic bag. Squeeze out the excess air. Place this bag inside another plastic bag. Slide the bag into a pillowcase or place a damp towel between your skin and the bag.   Mix 3 parts water with 1 part rubbing alcohol. Freeze the mixture in a sealable plastic bag. When you remove the mixture from the freezer, it will be slushy. Squeeze out the excess air. Place this bag inside another plastic bag. Slide the bag into a pillowcase or place a damp towel between your skin  and the bag.  SEEK MEDICAL CARE IF:  You develop white spots on your skin. This may give the skin a blotchy (mottled) appearance.   Your skin turns blue or pale.   Your skin becomes waxy or hard.   Your swelling gets worse.  MAKE SURE YOU:   Understand these instructions.   Will watch your condition.   Will get help right away if you are not doing well or get worse.  Document Released: 09/08/2010 Document Revised: 01/01/2011 Document Reviewed: 09/08/2010 [facility] Patient Information 2012 [place].Sciatica Sciatica is a weakness and/or changes in sensation (tingling, jolts, hot and cold, numbness) along the path the sciatic nerve travels. Irritation or damage to lumbar nerve roots is often also referred to as lumbar radiculopathy.  Lumbar radiculopathy (Sciatica) is the most common form of this problem. Radiculopathy can occur in any of the nerves coming out of the spinal cord. The problems caused depend on which nerves are involved. The sciatic nerve is the large nerve supplying the branches of nerves going from the hip to the toes. It often causes a numbness or weakness in the skin and/or muscles that the sciatic nerve serves. It also may cause symptoms (problems) of pain, burning, tingling, or electric shock-like feelings in the path of this nerve. This usually comes from injury to the fibers that make up the sciatic nerve. Some of these symptoms are low back pain and/or unpleasant feelings in the following areas:  From the mid-buttock down the back of the leg to the back of the knee.   And/or the outside of the calf and top of the foot.   And/or behind the inner ankle to the sole of the foot.  CAUSES   Herniated or slipped disc. Discs are the little cushions between the bones in the back.   Pressure by the piriformis muscle in the buttock on the sciatic nerve (Piriformis Syndrome).   Misalignment of the bones in the lower back and buttocks (Sacroiliac Joint Derangement).     Narrowing of the spinal canal that puts pressure on or pinches the fibers that make up the sciatic nerve.   A slipped vertebra that is out of line with those above or beneath it.   Abnormality of the nervous system itself so that nerve fibers do not transmit signals properly, especially to feet and calves (neuropathy).   Tumor (this is rare).  Your caregiver can usually determine the cause of your sciatica and begin the treatment most likely to help you. TREATMENT  Taking over-the-counter painkillers, physical therapy, rest, exercise, spinal manipulation, and injections of anesthetics and/or steroids may be used. Surgery, acupuncture, and Yoga can also be effective. Mind over matter techniques, mental imagery, and changing factors such as your bed, chair, desk height, posture, and activities are other treatments that may be  helpful. You and your caregiver can help determine what is best for you. With proper diagnosis, the cause of most sciatica can be identified and removed. Communication and cooperation between your caregiver and you is essential. If you are not successful immediately, do not be discouraged. With time, a proper treatment can be found that will make you comfortable. HOME CARE INSTRUCTIONS   If the pain is coming from a problem in the back, applying ice to that area for 15 to 20 minutes, 3 to 4 times per day while awake, may be helpful. Put the ice in a plastic bag. Place a towel between the bag of ice and your skin.   You may exercise or perform your usual activities if these do not aggravate your pain, or as suggested by your caregiver.   Only take over-the-counter or prescription medicines for pain, discomfort, or fever as directed by your caregiver.   If your caregiver has given you a follow-up appointment, it is very important to keep that appointment. Not keeping the appointment could result in a chronic or permanent injury, pain, and disability. If there is any problem  keeping the appointment, you must call back to this facility for assistance.  SEEK IMMEDIATE MEDICAL CARE IF:   You experience loss of control of bowel or bladder.   You have increasing weakness in the trunk, buttocks, or legs.   There is numbness in any areas from the hip down to the toes.   You have difficulty walking or keeping your balance.   You have any of the above, with fever or forceful vomiting.  Document Released: 01/06/2001 Document Revised: 01/01/2011 Document Reviewed: 08/26/2007 [facility] Patient Information 2012 [place].   Take the meds as directed.  Apply ice several times daily to lower back.  Do NOT take any NSAID's while taking the prednisone.  Keep a close on your blood sugar readings because prednisone can cause it to increase.   Follow up with dr. Saintclair Halsted as planned.  Also keep the appt you have with your new PCP.

## 2011-09-14 ENCOUNTER — Emergency Department (HOSPITAL_COMMUNITY)
Admission: EM | Admit: 2011-09-14 | Discharge: 2011-09-14 | Disposition: A | Discharge status: Home / Self Care | Payer: Medicare Other | Attending: Emergency Medicine | Admitting: Emergency Medicine

## 2011-09-14 ENCOUNTER — Encounter (HOSPITAL_COMMUNITY): Payer: Self-pay | Admitting: *Deleted

## 2011-09-14 ENCOUNTER — Emergency Department (HOSPITAL_COMMUNITY): Payer: Medicare Other

## 2011-09-14 DIAGNOSIS — W19XXXA Unspecified fall, initial encounter: Secondary | ICD-10-CM

## 2011-09-14 DIAGNOSIS — F17200 Nicotine dependence, unspecified, uncomplicated: Secondary | ICD-10-CM | POA: Insufficient documentation

## 2011-09-14 DIAGNOSIS — Y9301 Activity, walking, marching and hiking: Secondary | ICD-10-CM | POA: Insufficient documentation

## 2011-09-14 DIAGNOSIS — W1789XA Other fall from one level to another, initial encounter: Secondary | ICD-10-CM | POA: Insufficient documentation

## 2011-09-14 DIAGNOSIS — I10 Essential (primary) hypertension: Secondary | ICD-10-CM | POA: Insufficient documentation

## 2011-09-14 DIAGNOSIS — E119 Type 2 diabetes mellitus without complications: Secondary | ICD-10-CM | POA: Insufficient documentation

## 2011-09-14 DIAGNOSIS — IMO0002 Reserved for concepts with insufficient information to code with codable children: Secondary | ICD-10-CM | POA: Insufficient documentation

## 2011-09-14 DIAGNOSIS — S20229A Contusion of unspecified back wall of thorax, initial encounter: Secondary | ICD-10-CM | POA: Insufficient documentation

## 2011-09-14 DIAGNOSIS — Y998 Other external cause status: Secondary | ICD-10-CM | POA: Insufficient documentation

## 2011-09-14 DIAGNOSIS — G8929 Other chronic pain: Secondary | ICD-10-CM | POA: Insufficient documentation

## 2011-09-14 MED ORDER — OXYCODONE-ACETAMINOPHEN 5-325 MG PO TABS
2.0000 | ORAL_TABLET | Freq: Once | ORAL | Status: AC
  Administered 2011-09-14: 2 via ORAL
  Filled 2011-09-14: qty 2

## 2011-09-14 MED ORDER — CYCLOBENZAPRINE HCL 10 MG PO TABS: 10.0000 mg | ORAL_TABLET | Freq: Three times a day (TID) | ORAL | Status: AC | PRN

## 2011-09-14 MED ORDER — OXYCODONE-ACETAMINOPHEN 5-325 MG PO TABS: 1.0000 | ORAL_TABLET | Freq: Four times a day (QID) | ORAL | Status: AC | PRN

## 2011-09-14 MED ORDER — HYDROMORPHONE HCL PF 1 MG/ML IJ SOLN
1.0000 mg | Freq: Once | INTRAMUSCULAR | Status: AC
  Administered 2011-09-14: 1 mg via INTRAMUSCULAR
  Filled 2011-09-14: qty 1

## 2011-09-14 NOTE — ED Notes (Signed)
Fell down 3 steps this am. Back pain , and dental pain.  Alert,  NO loc, no neck pain.

## 2011-09-14 NOTE — ED Provider Notes (Signed)
History     CSN: [id number]  Arrival date & time 09/14/11  1622   First MD Initiated Contact with Patient 09/14/11 1720      Chief Complaint  Patient presents with  . Fall    (Consider location/radiation/quality/duration/timing/severity/associated sxs/prior treatment) Patient is a 41 y.o. female presenting with fall. The history is provided by the patient (the pt fell and hurt her back). No language interpreter was used.  Fall The accident occurred 1 to 2 hours ago. The fall occurred while walking. She fell from a height of 6 to 10 ft. She landed on concrete. Point of impact: back. Pain location: back. The pain is at a severity of 5/10. The pain is moderate. She was ambulatory at the scene. There was no entrapment after the fall. There was no alcohol use involved in the accident. Pertinent negatives include no abdominal pain, no hematuria and no headaches.    Past Medical History  Diagnosis Date  . Diabetes mellitus   . Chronic back pain   . Hypertension   . PCOS (polycystic ovarian syndrome)   . Depression   . DDD (degenerative disc disease)   . Sciatica     Past Surgical History  Procedure Date  . Back surgery   . Cholecystectomy   . Neck surgery   . Carpal tunnel release     History reviewed. No pertinent family history.  History  Substance Use Topics  . Smoking status: Current Some Day Smoker -- 1.5 packs/day for 20 years    Types: Cigarettes    Last Attempt to Quit: 05/11/2006  . Smokeless tobacco: Never Used   Comment: pain pills  . Alcohol Use: No    OB History    Grav Para Term Preterm Abortions TAB SAB Ect Mult Living            0      Review of Systems  Constitutional: Negative for fatigue.  HENT: Negative for congestion, sinus pressure and ear discharge.   Eyes: Negative for discharge.  Respiratory: Negative for cough.   Cardiovascular: Negative for chest pain.  Gastrointestinal: Negative for abdominal pain and diarrhea.  Genitourinary:  Negative for frequency and hematuria.  Musculoskeletal: Positive for back pain.  Skin: Negative for rash.  Neurological: Negative for seizures and headaches.  Hematological: Negative.   Psychiatric/Behavioral: Negative for hallucinations.    Allergies  Ciprofloxacin  Home Medications   Current Outpatient Rx  Name Route Sig Dispense Refill  . HYDROCODONE-ACETAMINOPHEN 5-325 MG PO TABS Oral Take 1 tablet by mouth once as needed.    . IBUPROFEN 200 MG PO TABS Oral Take 600 mg by mouth as needed.    Marland Kitchen METFORMIN HCL 500 MG PO TABS Oral Take 500 mg by mouth 2 (two) times daily. For diabetes    . CYCLOBENZAPRINE HCL 10 MG PO TABS Oral Take 1 tablet (10 mg total) by mouth 3 (three) times daily as needed for muscle spasms. 30 tablet 0  . OXYCODONE-ACETAMINOPHEN 5-325 MG PO TABS Oral Take 1 tablet by mouth every 6 (six) hours as needed for pain. 30 tablet 0    BP 104/67  Pulse 79  Temp 98.3 F (36.8 C) (Oral)  Resp 18  Ht 5' 5"  (1.651 m)  Wt 189 lb (85.73 kg)  BMI 31.45 kg/m2  SpO2 96%  LMP 09/07/2011  Physical Exam  Constitutional: She is oriented to person, place, and time. She appears well-developed.  HENT:  Head: Normocephalic and atraumatic.  Eyes: Conjunctivae and EOM  are normal. No scleral icterus.  Neck: Neck supple. No thyromegaly present.  Cardiovascular: Normal rate and regular rhythm.  Exam reveals no gallop and no friction rub.   No murmur heard. Pulmonary/Chest: No stridor. She has no wheezes. She has no rales. She exhibits no tenderness.  Abdominal: She exhibits no distension. There is no tenderness. There is no rebound.  Musculoskeletal: Normal range of motion. She exhibits no edema.       Tender lumbar spine  Lymphadenopathy:    She has no cervical adenopathy.  Neurological: She is oriented to person, place, and time. Coordination normal.       Positive straight leg raise bilaterally  Skin: No rash noted. No erythema.  Psychiatric: She has a normal mood and  affect. Her behavior is normal.    ED Course  Procedures (including critical care time)  Labs Reviewed - No data to display Dg Lumbar Spine Complete  09/14/2011  *RADIOLOGY REPORT*  Clinical Data: Fall.  Back pain  LUMBAR SPINE - COMPLETE 4+ VIEW  Comparison: 07/25/2011  Findings: Mild to moderate dextroscoliosis in the lumbar spine is unchanged.  Disc degeneration and spurring particularly on the left at L2-3  and on the right at L4-5 related to scoliosis.  Negative for fracture or malalignment.  IMPRESSION: Lumbar scoliosis and degenerative change.  Negative for fracture.   Original Report Authenticated By: Truett Perna, M.D.      1. Back contusion   2. Fall       MDM          Jamie Diego, MD 09/14/11 [phone]

## 2011-10-12 ENCOUNTER — Emergency Department (HOSPITAL_COMMUNITY)
Admission: EM | Admit: 2011-10-12 | Discharge: 2011-10-13 | Disposition: A | Discharge status: Home / Self Care | Payer: Medicare Other | Attending: Emergency Medicine | Admitting: Emergency Medicine

## 2011-10-12 ENCOUNTER — Encounter (HOSPITAL_COMMUNITY): Payer: Self-pay | Admitting: *Deleted

## 2011-10-12 DIAGNOSIS — E119 Type 2 diabetes mellitus without complications: Secondary | ICD-10-CM | POA: Insufficient documentation

## 2011-10-12 DIAGNOSIS — G8929 Other chronic pain: Secondary | ICD-10-CM | POA: Insufficient documentation

## 2011-10-12 DIAGNOSIS — I10 Essential (primary) hypertension: Secondary | ICD-10-CM | POA: Insufficient documentation

## 2011-10-12 DIAGNOSIS — Z87891 Personal history of nicotine dependence: Secondary | ICD-10-CM | POA: Insufficient documentation

## 2011-10-12 DIAGNOSIS — IMO0002 Reserved for concepts with insufficient information to code with codable children: Secondary | ICD-10-CM | POA: Insufficient documentation

## 2011-10-12 NOTE — ED Notes (Signed)
Low back pain, fell this am, slipped on a rug.

## 2011-10-13 ENCOUNTER — Emergency Department (HOSPITAL_COMMUNITY): Payer: Medicare Other

## 2011-10-13 MED ORDER — IBUPROFEN 800 MG PO TABS
800.0000 mg | ORAL_TABLET | Freq: Once | ORAL | Status: AC
  Administered 2011-10-13: 800 mg via ORAL
  Filled 2011-10-13: qty 1

## 2011-10-13 MED ORDER — PREDNISONE 50 MG PO TABS: 50.0000 mg | ORAL_TABLET | Freq: Every day | ORAL | Status: DC

## 2011-10-13 MED ORDER — HYDROCODONE-ACETAMINOPHEN 5-325 MG PO TABS: 1.0000 | ORAL_TABLET | Freq: Four times a day (QID) | ORAL | Status: DC | PRN

## 2011-10-13 MED ORDER — OXYCODONE-ACETAMINOPHEN 5-325 MG PO TABS
2.0000 | ORAL_TABLET | Freq: Once | ORAL | Status: AC
  Administered 2011-10-13: 2 via ORAL
  Filled 2011-10-13: qty 2

## 2011-10-13 NOTE — ED Provider Notes (Signed)
Medical screening examination/treatment/procedure(s) were performed by non-physician practitioner and as supervising physician I was immediately available for consultation/collaboration.    Johnna Acosta, MD 10/13/11 [phone]

## 2011-10-13 NOTE — ED Notes (Signed)
Pt discharged. Pt stable at time of discharge. Medications reviewed pt has no questions regarding discharge at this time. Pt voiced understanding of discharge instructions.  

## 2011-10-13 NOTE — ED Provider Notes (Signed)
History     CSN: [id number]  Arrival date & time 10/12/11  2124   First MD Initiated Contact with Patient 10/12/11 2303      Chief Complaint  Patient presents with  . Fall    (Consider location/radiation/quality/duration/timing/severity/associated sxs/prior treatment) HPI Comments: When pt got out of bed this AM and stepped on a small rug it slipped out from under her and she landed on her buttocks.  She complains of low back pain.  She has had multiple vertebral fusions by dr. Saintclair Halsted.  She has L radicular leg pain but states that is not new.  Patient is a 41 y.o. female presenting with fall. The history is provided by the patient. No language interpreter was used.  Fall The pain is severe. She was ambulatory at the scene. There was no entrapment after the fall. There was no drug use involved in the accident. There was no alcohol use involved in the accident. Pertinent negatives include no fever, no loss of consciousness and no tingling.    Past Medical History  Diagnosis Date  . Diabetes mellitus   . Chronic back pain   . Hypertension   . PCOS (polycystic ovarian syndrome)   . Depression   . DDD (degenerative disc disease)   . Sciatica     Past Surgical History  Procedure Date  . Back surgery   . Cholecystectomy   . Neck surgery   . Carpal tunnel release     History reviewed. No pertinent family history.  History  Substance Use Topics  . Smoking status: Former Smoker -- 1.5 packs/day for 20 years    Types: Cigarettes    Quit date: 05/11/2006  . Smokeless tobacco: Never Used   Comment: pain pills  . Alcohol Use: No    OB History    Grav Para Term Preterm Abortions TAB SAB Ect Mult Living            0      Review of Systems  Constitutional: Negative for fever and chills.  HENT: Negative for neck pain.   Genitourinary: Negative for dysuria, urgency, frequency, flank pain and pelvic pain.  Musculoskeletal: Positive for back pain.  Skin: Negative for wound.    Neurological: Negative for tingling and loss of consciousness.  All other systems reviewed and are negative.    Allergies  Ciprofloxacin  Home Medications   Current Outpatient Rx  Name Route Sig Dispense Refill  . HYDROCODONE-ACETAMINOPHEN 5-325 MG PO TABS Oral Take 1 tablet by mouth once as needed.    Marland Kitchen HYDROCODONE-ACETAMINOPHEN 5-325 MG PO TABS Oral Take 1 tablet by mouth every 6 (six) hours as needed for pain. 20 tablet 0  . IBUPROFEN 200 MG PO TABS Oral Take 600 mg by mouth as needed.    Marland Kitchen METFORMIN HCL 500 MG PO TABS Oral Take 500 mg by mouth 2 (two) times daily. For diabetes    . PREDNISONE 50 MG PO TABS Oral Take 1 tablet (50 mg total) by mouth daily. 6 tablet 0    BP 132/82  Pulse 109  Temp 99.1 F (37.3 C) (Oral)  Resp 20  Ht 5' 5"  (1.651 m)  Wt 190 lb (86.183 kg)  BMI 31.62 kg/m2  SpO2 99%  LMP 09/27/2011  Physical Exam  Nursing note and vitals reviewed. Constitutional: She is oriented to person, place, and time. She appears well-developed and well-nourished. No distress.  HENT:  Head: Normocephalic and atraumatic.  Eyes: EOM are normal.  Neck: Normal range  of motion.  Cardiovascular: Normal rate, regular rhythm and normal heart sounds.   Pulmonary/Chest: Effort normal and breath sounds normal.  Abdominal: Soft. She exhibits no distension. There is no tenderness.  Musculoskeletal: She exhibits tenderness.       Lumbar back: She exhibits decreased range of motion, tenderness, bony tenderness and pain. She exhibits no swelling, no edema, no deformity, no laceration, no spasm and normal pulse.       Back:  Neurological: She is alert and oriented to person, place, and time.  Skin: Skin is warm and dry.  Psychiatric: She has a normal mood and affect. Judgment normal.    ED Course  Procedures (including critical care time)  Labs Reviewed - No data to display Dg Lumbar Spine Complete  10/13/2011  *RADIOLOGY REPORT*  Clinical Data: Fall.  Low back pain.   History of prior surgery.  LUMBAR SPINE - COMPLETE 4+ VIEW  Comparison: Plain films 09/14/2011.  Findings: There is no fracture.  Multilevel degenerative disease and convex right scoliosis appear unchanged.  Cholecystectomy clips are noted.  Single surgical clip is also identified in the pelvis.  IMPRESSION:  1.  No acute finding. 2.  Convex right scoliosis and multilevel degenerative disease appear stable.   Original Report Authenticated By: Arvid Right. D'ALESSIO, M.D.      1. Acute exacerbation of chronic low back pain       MDM  No qcute abnorm on x-ray Ice rx-hydrocodone, 20 rx-prednisone 50 mg, 6 F/u with dr. Saintclair Halsted or MD of your choice.        Jennye Boroughs, [place] 10/13/11 0110

## 2011-10-22 ENCOUNTER — Emergency Department (HOSPITAL_COMMUNITY)
Admission: EM | Admit: 2011-10-22 | Discharge: 2011-10-22 | Disposition: A | Discharge status: Home / Self Care | Payer: Medicare Other | Attending: Emergency Medicine | Admitting: Emergency Medicine

## 2011-10-22 ENCOUNTER — Encounter (HOSPITAL_COMMUNITY): Payer: Self-pay | Admitting: Emergency Medicine

## 2011-10-22 DIAGNOSIS — F17200 Nicotine dependence, unspecified, uncomplicated: Secondary | ICD-10-CM | POA: Insufficient documentation

## 2011-10-22 DIAGNOSIS — E119 Type 2 diabetes mellitus without complications: Secondary | ICD-10-CM | POA: Insufficient documentation

## 2011-10-22 DIAGNOSIS — W06XXXA Fall from bed, initial encounter: Secondary | ICD-10-CM | POA: Insufficient documentation

## 2011-10-22 DIAGNOSIS — G8929 Other chronic pain: Secondary | ICD-10-CM | POA: Insufficient documentation

## 2011-10-22 DIAGNOSIS — M545 Low back pain, unspecified: Secondary | ICD-10-CM | POA: Insufficient documentation

## 2011-10-22 DIAGNOSIS — I10 Essential (primary) hypertension: Secondary | ICD-10-CM | POA: Insufficient documentation

## 2011-10-22 DIAGNOSIS — IMO0002 Reserved for concepts with insufficient information to code with codable children: Secondary | ICD-10-CM | POA: Insufficient documentation

## 2011-10-22 DIAGNOSIS — M5416 Radiculopathy, lumbar region: Secondary | ICD-10-CM

## 2011-10-22 MED ORDER — IBUPROFEN 800 MG PO TABS
800.0000 mg | ORAL_TABLET | Freq: Once | ORAL | Status: AC
  Administered 2011-10-22: 800 mg via ORAL
  Filled 2011-10-22: qty 1

## 2011-10-22 MED ORDER — OXYCODONE-ACETAMINOPHEN 5-325 MG PO TABS
ORAL_TABLET | ORAL | Status: DC
Start: 2011-10-22 — End: 2011-11-08

## 2011-10-22 NOTE — ED Notes (Signed)
Pt c/o lower back pain that radiates down her rt leg. Pt states she fell last week.

## 2011-10-22 NOTE — ED Provider Notes (Signed)
History     CSN: [id number]  Arrival date & time 10/22/11  1016   First MD Initiated Contact with Patient 10/22/11 1036      Chief Complaint  Patient presents with  . Back Pain  . Leg Pain    (Consider location/radiation/quality/duration/timing/severity/associated sxs/prior treatment) HPI Comments: Pt here last week c/. Low back pain after falling out of bed.  States began having throbbing pain radiating to R foot 2 days ago.  Is diabetic and took prednisone with blood sugars "out of control".  Patient is a 41 y.o. female presenting with back pain and leg pain. The history is provided by the patient. No language interpreter was used.  Back Pain  This is a new problem. The problem occurs constantly. The problem has not changed since onset.The pain is associated with falling. The pain is present in the lumbar spine. The quality of the pain is described as stabbing. The pain radiates to the right foot. The pain is severe. The symptoms are aggravated by bending and twisting. The pain is the same all the time. Associated symptoms include leg pain. Pertinent negatives include no fever, no bowel incontinence, no perianal numbness, no bladder incontinence, no dysuria, no pelvic pain, no paresthesias, no paresis, no tingling and no weakness.  Leg Pain  Pertinent negatives include no tingling.    Past Medical History  Diagnosis Date  . Diabetes mellitus   . Chronic back pain   . Hypertension   . PCOS (polycystic ovarian syndrome)   . Depression   . DDD (degenerative disc disease)   . Sciatica     Past Surgical History  Procedure Date  . Back surgery   . Cholecystectomy   . Neck surgery   . Carpal tunnel release     History reviewed. No pertinent family history.  History  Substance Use Topics  . Smoking status: Current Every Day Smoker -- 1.5 packs/day for 20 years    Types: Cigarettes    Last Attempt to Quit: 05/11/2006  . Smokeless tobacco: Never Used   Comment: pain pills    . Alcohol Use: No    OB History    Grav Para Term Preterm Abortions TAB SAB Ect Mult Living            0      Review of Systems  Constitutional: Negative for fever and chills.  Gastrointestinal: Negative for bowel incontinence.  Genitourinary: Negative for bladder incontinence, dysuria and pelvic pain.  Musculoskeletal: Positive for back pain.  Neurological: Negative for tingling, weakness and paresthesias.  All other systems reviewed and are negative.    Allergies  Ciprofloxacin  Home Medications   Current Outpatient Rx  Name Route Sig Dispense Refill  . IBUPROFEN 200 MG PO TABS Oral Take 400-600 mg by mouth daily as needed. Back pain/headache.    . METFORMIN HCL 500 MG PO TABS Oral Take 500 mg by mouth 2 (two) times daily. For diabetes    . OXYCODONE-ACETAMINOPHEN 5-325 MG PO TABS  One tabb po q 6 hrs prn pain 12 tablet 0    BP 132/83  Pulse 89  Temp 99.2 F (37.3 C) (Oral)  Resp 20  Ht 5' 5"  (1.651 m)  Wt 189 lb (85.73 kg)  BMI 31.45 kg/m2  SpO2 100%  LMP 09/27/2011  Physical Exam  Nursing note and vitals reviewed. Constitutional: She is oriented to person, place, and time. She appears well-developed and well-nourished. No distress.  HENT:  Head: Normocephalic and atraumatic.  Eyes: EOM are normal.  Neck: Normal range of motion.  Cardiovascular: Normal rate, regular rhythm and normal heart sounds.   Pulmonary/Chest: Effort normal and breath sounds normal.  Abdominal: Soft. She exhibits no distension. There is no tenderness.  Musculoskeletal:       Lumbar back: She exhibits decreased range of motion, tenderness and pain. She exhibits no bony tenderness, no swelling, no edema, no deformity, no laceration, no spasm and normal pulse.       Back:  Neurological: She is alert and oriented to person, place, and time.  Skin: Skin is warm and dry.  Psychiatric: She has a normal mood and affect. Judgment normal.    ED Course  Procedures (including critical care  time)  Labs Reviewed - No data to display No results found.   1. Lumbar back pain   2. Lumbar radiculopathy, acute       MDM  Ic, ibuprofen rx-percocet, 12 F/u with dr. Caprice Red, PA 10/22/11 1136

## 2011-10-22 NOTE — ED Notes (Signed)
Patient con't. C/o back pain.  Medicated prior to d/c. Respirations even and unlabored. Skin warm/dry. Discharge instructions reviewed with patient at this time. Patient given opportunity to voice concerns/ask questions. Patient discharged at this time and left Emergency Department with steady gait.

## 2011-10-24 NOTE — ED Provider Notes (Signed)
Medical screening examination/treatment/procedure(s) were performed by non-physician practitioner and as supervising physician I was immediately available for consultation/collaboration.   Mylinda Latina III, MD 10/24/11 [phone]

## 2011-11-08 ENCOUNTER — Emergency Department (HOSPITAL_COMMUNITY)
Admission: EM | Admit: 2011-11-08 | Discharge: 2011-11-09 | Discharge status: Against Medical Advice | Payer: Medicare Other | Attending: Emergency Medicine | Admitting: Emergency Medicine

## 2011-11-08 ENCOUNTER — Encounter (HOSPITAL_COMMUNITY): Payer: Self-pay

## 2011-11-08 DIAGNOSIS — Z9089 Acquired absence of other organs: Secondary | ICD-10-CM | POA: Insufficient documentation

## 2011-11-08 DIAGNOSIS — F1910 Other psychoactive substance abuse, uncomplicated: Secondary | ICD-10-CM

## 2011-11-08 DIAGNOSIS — F1120 Opioid dependence, uncomplicated: Secondary | ICD-10-CM

## 2011-11-08 DIAGNOSIS — G8929 Other chronic pain: Secondary | ICD-10-CM | POA: Insufficient documentation

## 2011-11-08 DIAGNOSIS — I10 Essential (primary) hypertension: Secondary | ICD-10-CM | POA: Insufficient documentation

## 2011-11-08 DIAGNOSIS — E119 Type 2 diabetes mellitus without complications: Secondary | ICD-10-CM

## 2011-11-08 DIAGNOSIS — E1169 Type 2 diabetes mellitus with other specified complication: Secondary | ICD-10-CM | POA: Insufficient documentation

## 2011-11-08 LAB — COMPREHENSIVE METABOLIC PANEL
ALT: 45 U/L — ABNORMAL HIGH (ref 0–35)
AST: 61 U/L — ABNORMAL HIGH (ref 0–37)
Albumin: 3.5 g/dL (ref 3.5–5.2)
Calcium: 10.2 mg/dL (ref 8.4–10.5)
Creatinine, Ser: 0.55 mg/dL (ref 0.50–1.10)
GFR calc non Af Amer: >90 mL/min (ref >90)
Sodium: 131 mEq/L — ABNORMAL LOW (ref 135–145)
Total Protein: 9.1 g/dL — ABNORMAL HIGH (ref 6.0–8.3)

## 2011-11-08 LAB — CBC WITH DIFFERENTIAL/PLATELET
Basophils Absolute: 0 10*3/uL (ref 0.0–0.1)
Basophils Relative: 0 % (ref 0–1)
Eosinophils Absolute: 0.1 10*3/uL (ref 0.0–0.7)
Eosinophils Relative: 1 % (ref 0–5)
HCT: 44.9 % (ref 36.0–46.0)
MCHC: 35 g/dL (ref 30.0–36.0)
MCV: 90.2 fL (ref 78.0–100.0)
Monocytes Absolute: 0.7 10*3/uL (ref 0.1–1.0)
Platelets: 259 10*3/uL (ref 150–400)
RDW: 13.2 % (ref 11.5–15.5)
WBC: 11.1 10*3/uL — ABNORMAL HIGH (ref 4.0–10.5)

## 2011-11-08 LAB — RAPID URINE DRUG SCREEN, HOSP PERFORMED
Amphetamines: NOT DETECTED
Cocaine: NOT DETECTED
Opiates: POSITIVE — AB

## 2011-11-08 LAB — URINALYSIS, ROUTINE W REFLEX MICROSCOPIC
Bilirubin Urine: NEGATIVE
Glucose, UA: 500 mg/dL — AB
Hgb urine dipstick: NEGATIVE
Specific Gravity, Urine: >1.03 — ABNORMAL HIGH (ref 1.005–1.030)
Urobilinogen, UA: 0.2 mg/dL (ref 0.0–1.0)
pH: 6 (ref 5.0–8.0)

## 2011-11-08 LAB — URINE MICROSCOPIC-ADD ON

## 2011-11-08 LAB — PREGNANCY, URINE: Preg Test, Ur: NEGATIVE

## 2011-11-08 MED ORDER — METFORMIN HCL 500 MG PO TABS
500.0000 mg | ORAL_TABLET | Freq: Two times a day (BID) | ORAL | Status: DC
  Administered 2011-11-09: 500 mg via ORAL
  Filled 2011-11-08: qty 1

## 2011-11-08 MED ORDER — NICOTINE 21 MG/24HR TD PT24
21.0000 mg | MEDICATED_PATCH | Freq: Every day | TRANSDERMAL | Status: DC
  Administered 2011-11-09: 21 mg via TRANSDERMAL

## 2011-11-08 MED ORDER — NICOTINE 21 MG/24HR TD PT24
21.0000 mg | MEDICATED_PATCH | Freq: Once | TRANSDERMAL | Status: DC
  Administered 2011-11-08: 21 mg via TRANSDERMAL
  Filled 2011-11-08: qty 1

## 2011-11-08 MED ORDER — SODIUM CHLORIDE 0.9 % IV BOLUS (SEPSIS)
1000.0000 mL | Freq: Once | INTRAVENOUS | Status: AC
  Administered 2011-11-08: 1000 mL via INTRAVENOUS

## 2011-11-08 MED ORDER — DEXTROSE 5 % IV SOLN
1.0000 g | Freq: Once | INTRAVENOUS | Status: AC
  Administered 2011-11-08: 1 g via INTRAVENOUS
  Filled 2011-11-08: qty 10

## 2011-11-08 MED ORDER — ONDANSETRON HCL 4 MG PO TABS
4.0000 mg | ORAL_TABLET | Freq: Three times a day (TID) | ORAL | Status: DC | PRN
  Administered 2011-11-09: 4 mg via ORAL
  Filled 2011-11-08: qty 1

## 2011-11-08 MED ORDER — IBUPROFEN 400 MG PO TABS
600.0000 mg | ORAL_TABLET | Freq: Three times a day (TID) | ORAL | Status: DC | PRN
  Administered 2011-11-08: 600 mg via ORAL
  Filled 2011-11-08: qty 2

## 2011-11-08 MED ORDER — LORAZEPAM 1 MG PO TABS
1.0000 mg | ORAL_TABLET | Freq: Three times a day (TID) | ORAL | Status: DC | PRN
  Administered 2011-11-08 – 2011-11-09 (×2): 1 mg via ORAL
  Filled 2011-11-08 (×2): qty 1

## 2011-11-08 MED ORDER — NICOTINE 21 MG/24HR TD PT24
MEDICATED_PATCH | TRANSDERMAL | Status: AC
  Administered 2011-11-08: 21 mg via TRANSDERMAL
  Filled 2011-11-08: qty 1

## 2011-11-08 MED ORDER — ZOLPIDEM TARTRATE 5 MG PO TABS
5.0000 mg | ORAL_TABLET | Freq: Every evening | ORAL | Status: DC | PRN
  Administered 2011-11-08: 5 mg via ORAL
  Filled 2011-11-08: qty 1

## 2011-11-08 MED ORDER — CEPHALEXIN 500 MG PO CAPS
500.0000 mg | ORAL_CAPSULE | Freq: Four times a day (QID) | ORAL | Status: DC
  Administered 2011-11-08 – 2011-11-09 (×3): 500 mg via ORAL
  Filled 2011-11-08 (×3): qty 1

## 2011-11-08 MED ORDER — SODIUM CHLORIDE 0.9 % IV SOLN
INTRAVENOUS | Status: DC
  Administered 2011-11-09: via INTRAVENOUS

## 2011-11-08 NOTE — ED Notes (Signed)
Track marks scattered throughout upper extremities.

## 2011-11-08 NOTE — BH Assessment (Signed)
Assessment Note   Jamie Bell is an 41 y.o. female. PT REPORTS SHE WOULD LIKE DETOX AGAIN AS SHE RELAPSED August 27, 2011 AFTER BEING CLEAN FOR 2 MONTHS.  SHE REPORTS SHE HAS BEEN USING OPIATES SINCE SHE HURT HER BACK IN 2001.  SHE CONTINUES TO HAVE PAIN. SHE HAS HAD 3 SURGERIES.  SHE REPORTS SHE USED TO INJECT THE OPIATES BUT NOW TAKES THEM ORALLY, ABOUT 7-8 30 MG ROXICODONE PILLS . EDP, DR Jamie Bell REPORTS SEEING NEED TRACKS ON HER ARMS DURING HIS EXAMINATION.  HE ALSO REPORTS PT HAVING A UTI AND HAS ORDERED KEL FLEX X 7 DAYS AND PT HAS BEEN NON COMPLIANT WITH HER METFORMIN AND HE THEREFORE ORDERED METFORMIN X 7 DAYS.  PT REPORTS SHE OCCASIONALLY USES HER BROTHER'S INSULIN PEN. PT DENIES S/I, H/I AND IS NOT PSYCHOTIC.  SHE ADMITS TO BEING DEPRESSED.  HER MOTHER PUT HER OUT OF HER HOME TODAY AND UPON COMPLETION OD DETOX PT HAS A FRIEND SHE WILL MOVE IN WITH.  PT REPORTS SHE WAKES UP EVERY MORNING WONDERING WHERE SHE WILL GET HER NEXT PILL FROM. PT GROWS A FULL BEARD DUE TO HE PCOS.  PT REPORTS DUE TO LACK OF TRANSPORTATION, SHE WAS UNABLE TO CONTINUE WITH HER PAIN MANAGEMENT 2 YRS AGO.      Axis I: OPIATE DEPENDENCY Axis II: Deferred Axis III:  Past Medical History  Diagnosis Date  . Diabetes mellitus   . Chronic back pain   . Hypertension   . PCOS (polycystic ovarian syndrome)   . Depression   . DDD (degenerative disc disease)   . Sciatica    Axis IV: housing problems, other psychosocial or environmental problems, problems related to social environment, problems with access to health care services and problems with primary support group Axis V: 31-40 impairment in reality testing        Past Medical History:  Past Medical History  Diagnosis Date  . Diabetes mellitus   . Chronic back pain   . Hypertension   . PCOS (polycystic ovarian syndrome)   . Depression   . DDD (degenerative disc disease)   . Sciatica     Past Surgical History  Procedure Date  . Back surgery    . Cholecystectomy   . Neck surgery   . Carpal tunnel release     Family History: History reviewed. No pertinent family history.  Social History:  reports that she has been smoking Cigarettes.  She has a 30 pack-year smoking history. She has never used smokeless tobacco. She reports that she does not drink alcohol or use illicit drugs.  Additional Social History:  Alcohol / Drug Use Pain Medications: yes Prescriptions: na Over the Counter: na History of alcohol / drug use?: Yes Substance #1 Name of Substance 1: opiates 1 - Age of First Use: 30 1 - Amount (size/oz): as many as possible usually 7-8 roxycodones 30's  oral  but shows needle marks on arms pe dr Jamie Bell 1 - Frequency: daily 1 - Duration: 6 weeks 1 - Last Use / Amount: today   2 roxycodone 30's and 1 1/2 loratab 38m  oral  CIWA: CIWA-Ar BP: 127/82 mmHg Pulse Rate: 102  COWS: Clinical Opiate Withdrawal Scale (COWS) Resting Pulse Rate: Pulse Rate 81-100 Sweating: No report of chills or flushing Restlessness: Reports difficulty sitting still, but is able to do so Pupil Size: Pupils possibly larger than normal for room light Bone or Joint Aches: Patient reports sever diffuse aching of joints/muscles Runny Nose or Tearing:  Not present GI Upset: Stomach cramps Tremor: No tremor Yawning: No yawning Anxiety or Irritability: Patient obviously irritable/anxious Gooseflesh Skin: Skin is smooth COWS Total Score: 8   Allergies:  Allergies  Allergen Reactions  . Ciprofloxacin Swelling    Home Medications:  (Not in a hospital admission)  OB/GYN Status:  Patient's last menstrual period was 10/27/2011.  General Assessment Data Location of Assessment: [facility] ACT Assessment: Yes Living Arrangements: Other (Comment) (homeless as of today mother kicked her out) Can pt return to current living arrangement?: No Admission Status: Voluntary Is patient capable of signing voluntary admission?: Yes Transfer from: [facility] ([facility] Referral Source: MD (DR Jamie Bell)  Education Status Contact person: Jamie Bell-MOTHER-[phone]  Risk to self Suicidal Ideation: No Suicidal Intent: No Is patient at risk for suicide?: No Suicidal Plan?: No Access to Means: No What has been your use of drugs/alcohol within the last 12 months?: OPIATES Previous Attempts/Gestures: No How many times?: 0  Other Self Harm Risks: NA Triggers for Past Attempts: None known Intentional Self Injurious Behavior: None Family Suicide History: No Recent stressful life event(s): Other (Comment) (KICKED OUT OF HOME TODAY) Persecutory voices/beliefs?: No Depression: Yes Depression Symptoms: Isolating;Loss of interest in usual pleasures;Feeling worthless/self pity;Feeling angry/irritable;Guilt;Fatigue;Tearfulness Substance abuse history and/or treatment for substance abuse?: Yes Suicide prevention information given to non-admitted patients: Not applicable  Risk to Others Homicidal Ideation: No Thoughts of Harm to Others: No Current Homicidal Intent: No Current Homicidal Plan: No Access to Homicidal Means: No History of harm to others?: No Assessment of Violence: None Noted Violent Behavior Description: NA Does patient have access to weapons?: No Criminal Charges Pending?: No Does patient have a court date: No  Psychosis Hallucinations: None noted Delusions: None noted  Mental Status Report Appear/Hygiene: Improved Eye Contact: Good Motor Activity: Freedom of movement Speech: Logical/coherent Level of Consciousness: Alert Mood: Depressed;Despair;Sad;Helpless;Guilty Affect: Appropriate to circumstance Anxiety Level: Minimal Thought Processes: Coherent;Relevant Judgement: Impaired Orientation: Person;Place;Time;Situation Obsessive Compulsive Thoughts/Behaviors: Severe (WAKES EVERY MORNING WONDERING WHERE SHE IS GOING TOGET HER P)  Cognitive Functioning Concentration: Normal Memory: Recent  Intact;Remote Intact IQ: Average Insight: Poor Impulse Control: Poor Appetite: Good Sleep: No Change Total Hours of Sleep: 6  Vegetative Symptoms: None  ADLScreening [facility] Assessment Services) Patient's cognitive ability adequate to safely complete daily activities?: Yes Patient able to express need for assistance with ADLs?: Yes Independently performs ADLs?: Yes (appropriate for developmental age)  Abuse/Neglect [facility]) Physical Abuse: Denies Verbal Abuse: Denies Sexual Abuse: Denies  Prior Inpatient Therapy Prior Inpatient Therapy: Yes Prior Therapy Dates: 06/17/2011 Prior Therapy Facilty/Provider(s): [facility] Reason for Treatment: OPIATE DETOX  Prior Outpatient Therapy Prior Outpatient Therapy: No Prior Therapy Dates: NA Prior Therapy Facilty/Provider(s): NA Reason for Treatment: NA  ADL Screening (condition at time of admission) Patient's cognitive ability adequate to safely complete daily activities?: Yes Patient able to express need for assistance with ADLs?: Yes Independently performs ADLs?: Yes (appropriate for developmental age) Weakness of Legs: None Weakness of Arms/Hands: None  Home Assistive Devices/Equipment Home Assistive Devices/Equipment: None  Therapy Consults (therapy consults require a physician order) PT Evaluation Needed: No OT Evalulation Needed: No SLP Evaluation Needed: No Abuse/Neglect Assessment (Assessment to be complete while patient is alone) Physical Abuse: Denies Verbal Abuse: Denies Sexual Abuse: Denies Exploitation of patient/patient's resources: Denies Self-Neglect: Denies Values / Beliefs Cultural Requests During Hospitalization: None Spiritual Requests During Hospitalization: None Consults Spiritual Care Consult Needed: No Social Work Consult Needed: No Regulatory affairs officer (For Healthcare) Advance Directive: Patient does  not have advance directive;Patient would not like information Pre-existing out of facility DNR order  (yellow form or pink MOST form): No    Additional Information 1:1 In Past 12 Months?: No CIRT Risk: No Elopement Risk: No Does patient have medical clearance?: Yes     Disposition:  Disposition Disposition of Patient: Inpatient treatment program Type of inpatient treatment program: Adult (REFERRED TO [facility] [place])  On Site Evaluation by:   Reviewed with Physician:  DR Tobi Bastos Winford 11/08/2011 10:26 PM

## 2011-11-08 NOTE — ED Provider Notes (Addendum)
History     CSN: [id number]  Arrival date & time 11/08/11  1914   First MD Initiated Contact with Patient 11/08/11 1945      Chief Complaint  Patient presents with  . Addiction Problem  . Hyperglycemia    (Consider location/radiation/quality/duration/timing/severity/associated sxs/prior treatment) The history is provided by the patient.   patient is a 41 year old female presents wondering inpatient detox has a long-standing history of opiate abuse states that is predominantly Roxicodone and Lortab the patient denies any IV drug abuse. Patient denies suicidal or homicidal ideation. Also has a history of diabetes has not been taking her metformin doesn't it though she's occasionally been injecting insulin. Not supposed to be on insulin. Patient denies any other symptoms.  Past Medical History  Diagnosis Date  . Diabetes mellitus   . Chronic back pain   . Hypertension   . PCOS (polycystic ovarian syndrome)   . Depression   . DDD (degenerative disc disease)   . Sciatica     Past Surgical History  Procedure Date  . Back surgery   . Cholecystectomy   . Neck surgery   . Carpal tunnel release     History reviewed. No pertinent family history.  History  Substance Use Topics  . Smoking status: Current Every Day Smoker -- 1.5 packs/day for 20 years    Types: Cigarettes    Last Attempt to Quit: 05/11/2006  . Smokeless tobacco: Never Used   Comment: pain pills  . Alcohol Use: No    OB History    Grav Para Term Preterm Abortions TAB SAB Ect Mult Living            0      Review of Systems  Constitutional: Negative for fever.  HENT: Negative for congestion, sore throat and neck pain.   Eyes: Negative for redness.  Respiratory: Negative for cough and shortness of breath.   Gastrointestinal: Negative for nausea, vomiting, abdominal pain and diarrhea.  Genitourinary: Negative for dysuria and hematuria.  Musculoskeletal: Negative for back pain.  Skin: Negative for rash.    Neurological: Negative for headaches.  Hematological: Does not bruise/bleed easily.    Allergies  Ciprofloxacin  Home Medications   Current Outpatient Rx  Name Route Sig Dispense Refill  . IBUPROFEN 200 MG PO TABS Oral Take 400-600 mg by mouth daily as needed. Back pain/headache.    . METFORMIN HCL 500 MG PO TABS Oral Take 500 mg by mouth 2 (two) times daily. For diabetes      BP 127/82  Pulse 102  Temp 98.9 F (37.2 C) (Oral)  Resp 18  Ht 5' 5.5" (1.664 m)  Wt 187 lb (84.823 kg)  BMI 30.65 kg/m2  SpO2 98%  LMP 10/27/2011  Physical Exam  Nursing note and vitals reviewed. Constitutional: She is oriented to person, place, and time. She appears well-developed and well-nourished. No distress.  HENT:  Head: Normocephalic and atraumatic.  Mouth/Throat: Oropharynx is clear and moist.  Eyes: Conjunctivae normal and EOM are normal. Pupils are equal, round, and reactive to light.  Neck: Normal range of motion. Neck supple.  Cardiovascular: Normal rate, regular rhythm and normal heart sounds.        Slightly tachycardic.  Pulmonary/Chest: Effort normal and breath sounds normal.  Abdominal: Soft. Bowel sounds are normal. There is no tenderness.  Musculoskeletal: Normal range of motion. She exhibits no edema.       Bilateral forearms with old needle tracks.  Neurological: She is alert and oriented to  person, place, and time. No cranial nerve deficit. She exhibits normal muscle tone. Coordination normal.  Skin: Skin is warm. No rash noted.  Psychiatric: She has a normal mood and affect.    ED Course  Procedures (including critical care time)  Labs Reviewed  CBC WITH DIFFERENTIAL - Abnormal; Notable for the following:    WBC 11.1 (*)     Hemoglobin 15.7 (*)     Neutro Abs 7.8 (*)     All other components within normal limits  COMPREHENSIVE METABOLIC PANEL - Abnormal; Notable for the following:    Sodium 131 (*)     Glucose, Bld 210 (*)     Total Protein 9.1 (*)     AST 61  (*)     ALT 45 (*)     Alkaline Phosphatase 149 (*)     All other components within normal limits  URINALYSIS, ROUTINE W REFLEX MICROSCOPIC - Abnormal; Notable for the following:    APPearance CLOUDY (*)     Specific Gravity, Urine >1.030 (*)     Glucose, UA 500 (*)     Protein, ur 30 (*)     All other components within normal limits  URINE RAPID DRUG SCREEN (HOSP PERFORMED) - Abnormal; Notable for the following:    Opiates POSITIVE (*)     Benzodiazepines POSITIVE (*)     All other components within normal limits  GLUCOSE, CAPILLARY - Abnormal; Notable for the following:    Glucose-Capillary 248 (*)     All other components within normal limits  URINE MICROSCOPIC-ADD ON - Abnormal; Notable for the following:    Squamous Epithelial / LPF MANY (*)     Bacteria, UA MANY (*)     All other components within normal limits  PREGNANCY, URINE  URINE CULTURE   No results found.  Results for orders placed during the hospital encounter of 11/08/11  CBC WITH DIFFERENTIAL      Component Value Range   WBC 11.1 (*) 4.0 - 10.5 K/uL   RBC 4.98  3.87 - 5.11 MIL/uL   Hemoglobin 15.7 (*) 12.0 - 15.0 g/dL   HCT 44.9  36.0 - 46.0 %   MCV 90.2  78.0 - 100.0 fL   MCH 31.5  26.0 - 34.0 pg   MCHC 35.0  30.0 - 36.0 g/dL   RDW 13.2  11.5 - 15.5 %   Platelets 259  150 - 400 K/uL   Neutrophils Relative 70  43 - 77 %   Neutro Abs 7.8 (*) 1.7 - 7.7 K/uL   Lymphocytes Relative 23  12 - 46 %   Lymphs Abs 2.6  0.7 - 4.0 K/uL   Monocytes Relative 6  3 - 12 %   Monocytes Absolute 0.7  0.1 - 1.0 K/uL   Eosinophils Relative 1  0 - 5 %   Eosinophils Absolute 0.1  0.0 - 0.7 K/uL   Basophils Relative 0  0 - 1 %   Basophils Absolute 0.0  0.0 - 0.1 K/uL  COMPREHENSIVE METABOLIC PANEL      Component Value Range   Sodium 131 (*) 135 - 145 mEq/L   Potassium 3.6  3.5 - 5.1 mEq/L   Chloride 97  96 - 112 mEq/L   CO2 24  19 - 32 mEq/L   Glucose, Bld 210 (*) 70 - 99 mg/dL   BUN 9  6 - 23 mg/dL   Creatinine, Ser  0.55  0.50 - 1.10 mg/dL   Calcium 10.2  8.4 - 10.5 mg/dL   Total Protein 9.1 (*) 6.0 - 8.3 g/dL   Albumin 3.5  3.5 - 5.2 g/dL   AST 61 (*) 0 - 37 U/L   ALT 45 (*) 0 - 35 U/L   Alkaline Phosphatase 149 (*) 39 - 117 U/L   Total Bilirubin 0.4  0.3 - 1.2 mg/dL   GFR calc non Af Amer >90  >90 mL/min   GFR calc Af Amer >90  >90 mL/min  URINALYSIS, ROUTINE W REFLEX MICROSCOPIC      Component Value Range   Color, Urine YELLOW  YELLOW   APPearance CLOUDY (*) CLEAR   Specific Gravity, Urine >1.030 (*) 1.005 - 1.030   pH 6.0  5.0 - 8.0   Glucose, UA 500 (*) NEGATIVE mg/dL   Hgb urine dipstick NEGATIVE  NEGATIVE   Bilirubin Urine NEGATIVE  NEGATIVE   Ketones, ur NEGATIVE  NEGATIVE mg/dL   Protein, ur 30 (*) NEGATIVE mg/dL   Urobilinogen, UA 0.2  0.0 - 1.0 mg/dL   Nitrite NEGATIVE  NEGATIVE   Leukocytes, UA NEGATIVE  NEGATIVE  PREGNANCY, URINE      Component Value Range   Preg Test, Ur NEGATIVE  NEGATIVE  URINE RAPID DRUG SCREEN (HOSP PERFORMED)      Component Value Range   Opiates POSITIVE (*) NONE DETECTED   Cocaine NONE DETECTED  NONE DETECTED   Benzodiazepines POSITIVE (*) NONE DETECTED   Amphetamines NONE DETECTED  NONE DETECTED   Tetrahydrocannabinol NONE DETECTED  NONE DETECTED   Barbiturates NONE DETECTED  NONE DETECTED  GLUCOSE, CAPILLARY      Component Value Range   Glucose-Capillary 248 (*) 70 - 99 mg/dL  URINE MICROSCOPIC-ADD ON      Component Value Range   Squamous Epithelial / LPF MANY (*) RARE   WBC, UA 21-50  <3 WBC/hpf   Bacteria, UA MANY (*) RARE     1. Opiate dependence   2. Polysubstance abuse   3. Diabetes       MDM  Patient presented with concern for polysubstance abuse. Patient admits to taking Roxicodone and Lortab oral pain medications and having a dependency. Also noticed that the patient has needle tracks on her both arms she denies using heroin but still suspicious after some IV drug abuse going on. Patient has a past history of diabetes not taking  medications supposed to be on metformin blood sugar here is 248 which is not too bad electrolytes are still pending. Urinalysis suggestive of perhaps a urinary tract infection urine culture sent and the urinary tract infection treated with Rocephin 1 g IV piggyback Will continue Keflex as an oral medication for that. We will contact team about admission for the polysubstance abuse. Patient wants inpatient detox. Patient is not suicidal or homicidal.  Patient's blood sugars not require any additional treatment here patient can be started back on her metformin as stated above.   Patient's electrolytes show no evidence of acidosis. Renal function is normal. She does have some mild liver function abnormalities no significant elevation in the bilirubin. Patient is medically cleared for evaluation by behavioral health.       Mervin Kung, MD 11/08/11 2101   Patient evaluated by behavioral health team they recommend inpatient detox I will most likely be a [facility]. No beds available until tomorrow. Patient will remain in the emergency department overnight temporary psych orders have been completed.  Mervin Kung, MD 11/08/11 2206

## 2011-11-08 NOTE — ED Notes (Signed)
States that she is taking roxicodone off and on since 2002.  States she had a back injury and surgery and has been addicted since then.  States she will take as many as she can get.  States it has progressed over the past few years.  States she went to rehab in May and planned to stay clean, but started taking the medications again.

## 2011-11-08 NOTE — ED Notes (Signed)
Taking roxicodone and have taken lortab per pt. Want to get help and get off the medications per pt. Also having high blood sugar, I do not have a family doctor and I am supposed to be taking Metformin.

## 2011-11-08 NOTE — ED Notes (Signed)
Requests nicotine patch.

## 2011-11-09 LAB — BASIC METABOLIC PANEL
BUN: 9 mg/dL (ref 6–23)
CO2: 24 mEq/L (ref 19–32)
Calcium: 9.6 mg/dL (ref 8.4–10.5)
Chloride: 102 mEq/L (ref 96–112)
Creatinine, Ser: 0.56 mg/dL (ref 0.50–1.10)

## 2011-11-09 NOTE — ED Notes (Signed)
Pt states she does not want to stay and will not be transferred to [facility]. Pt advised that she has bed assignment and report called.  AMA form signed

## 2011-11-09 NOTE — ED Notes (Signed)
Resting quietly, no complaints offered.

## 2011-11-09 NOTE — ED Notes (Signed)
Pt wanted to make a phone call on her cell, but was unable to get a signal.Accompanied pt to outer hallway where she could make her call.

## 2011-11-09 NOTE — [facility] Counselor (Signed)
Jamie Bell has been accepted to [facility] to the service of Dr. Einar Grad. When the ACT member asrrived to complete support paper work , he was informed that the patient had left AMA.  Her explanation  to nursing staff was that she did not want to stay. Bed for patient was canceled.

## 2011-11-09 NOTE — [facility] Counselor (Signed)
Patient has been accepted at [facility] by Dr. Scherrie Merritts pending bed availability.

## 2011-11-10 ENCOUNTER — Encounter (HOSPITAL_COMMUNITY): Payer: Self-pay

## 2011-11-10 ENCOUNTER — Emergency Department (HOSPITAL_COMMUNITY)
Admission: EM | Admit: 2011-11-10 | Discharge: 2011-11-10 | Discharge status: Against Medical Advice | Payer: Medicare Other | Attending: Emergency Medicine | Admitting: Emergency Medicine

## 2011-11-10 DIAGNOSIS — F3289 Other specified depressive episodes: Secondary | ICD-10-CM | POA: Insufficient documentation

## 2011-11-10 DIAGNOSIS — F1910 Other psychoactive substance abuse, uncomplicated: Secondary | ICD-10-CM | POA: Insufficient documentation

## 2011-11-10 DIAGNOSIS — F329 Major depressive disorder, single episode, unspecified: Secondary | ICD-10-CM | POA: Insufficient documentation

## 2011-11-10 DIAGNOSIS — E119 Type 2 diabetes mellitus without complications: Secondary | ICD-10-CM | POA: Insufficient documentation

## 2011-11-10 LAB — URINE CULTURE: Culture: NO GROWTH

## 2011-11-10 LAB — CBC WITH DIFFERENTIAL/PLATELET
Basophils Absolute: 0 10*3/uL (ref 0.0–0.1)
Basophils Relative: 0 % (ref 0–1)
Eosinophils Relative: 1 % (ref 0–5)
HCT: 44.9 % (ref 36.0–46.0)
MCHC: 35.2 g/dL (ref 30.0–36.0)
Monocytes Absolute: 0.4 10*3/uL (ref 0.1–1.0)
Neutro Abs: 9.1 10*3/uL — ABNORMAL HIGH (ref 1.7–7.7)
Platelets: 257 10*3/uL (ref 150–400)
RDW: 13.1 % (ref 11.5–15.5)
WBC: 11.4 10*3/uL — ABNORMAL HIGH (ref 4.0–10.5)

## 2011-11-10 LAB — COMPREHENSIVE METABOLIC PANEL
ALT: 48 U/L — ABNORMAL HIGH (ref 0–35)
AST: 67 U/L — ABNORMAL HIGH (ref 0–37)
Albumin: 3.7 g/dL (ref 3.5–5.2)
Calcium: 10.4 mg/dL (ref 8.4–10.5)
Creatinine, Ser: 0.54 mg/dL (ref 0.50–1.10)
Sodium: 136 mEq/L (ref 135–145)

## 2011-11-10 NOTE — ED Notes (Signed)
Pt wants detox from opiates. Stated last used yesterday. Was in er for same yesterday and signed out ama. Denies si/hi.

## 2011-11-10 NOTE — ED Provider Notes (Signed)
History   This chart was scribed for Jamie Diego, MD by Hampton Abbot. This patient was seen in room [facility]14/[facility]14 and the patient's care was started at 15:24.   CSN: [id number]  Arrival date & time 11/10/11  1236   First MD Initiated Contact with Patient 11/10/11 1523      Chief Complaint  Patient presents with  . V70.1    (Consider location/radiation/quality/duration/timing/severity/associated sxs/prior treatment) The history is provided by the patient. No language interpreter was used.   Jamie Bell is a 41 y.o. female who presents to the Emergency Department wanting to detox from opiate addiction lasting at least several months.  Pt reported here yesterday wanting to detox and left without any intervention, but reports she will not leave today.  Pt denies any current suicidal or homicidal thoughts.  Pt reports mild generalized pain.  Last opiate use was yesterday after leaving hospital.  Pt has h/o DM, HTN, and depression.    Past Medical History  Diagnosis Date  . Diabetes mellitus   . Chronic back pain   . Hypertension   . PCOS (polycystic ovarian syndrome)   . Depression   . DDD (degenerative disc disease)   . Sciatica     Past Surgical History  Procedure Date  . Back surgery   . Cholecystectomy   . Neck surgery   . Carpal tunnel release     No family history on file.  History  Substance Use Topics  . Smoking status: Former Smoker -- 1.5 packs/day for 20 years    Types: Cigarettes  . Smokeless tobacco: Never Used   Comment: pain pills  . Alcohol Use: No    No OB history provided.  Review of Systems  Constitutional: Negative for fatigue.  HENT: Negative for congestion, sinus pressure and ear discharge.   Eyes: Negative for discharge.  Respiratory: Negative for cough.   Cardiovascular: Negative for chest pain.  Gastrointestinal: Negative for abdominal pain and diarrhea.  Genitourinary: Negative for frequency and hematuria.  Musculoskeletal:  Negative for back pain.  Skin: Negative for rash.  Neurological: Negative for seizures and headaches.  Hematological: Negative.   Psychiatric/Behavioral: Negative for suicidal ideas, hallucinations and agitation.    Allergies  Ciprofloxacin  Home Medications   Current Outpatient Rx  Name Route Sig Dispense Refill  . IBUPROFEN 200 MG PO TABS Oral Take 400-600 mg by mouth daily as needed. Back pain/headache.    . METFORMIN HCL 500 MG PO TABS Oral Take 500 mg by mouth 2 (two) times daily. For diabetes      BP 141/87  Pulse 99  Temp 98 F (36.7 C) (Oral)  Resp 20  Ht 5' 5.5" (1.664 m)  Wt 187 lb (84.823 kg)  BMI 30.65 kg/m2  SpO2 100%  LMP 10/27/2011  Physical Exam  Nursing note and vitals reviewed. Constitutional: She is oriented to person, place, and time. She appears well-developed.  HENT:  Head: Normocephalic and atraumatic.  Eyes: Conjunctivae normal and EOM are normal. No scleral icterus.  Neck: Neck supple. No thyromegaly present.  Cardiovascular: Normal rate and regular rhythm.  Exam reveals no gallop and no friction rub.   No murmur heard. Pulmonary/Chest: No stridor. She has no wheezes. She has no rales. She exhibits no tenderness.  Abdominal: She exhibits no distension. There is no tenderness. There is no rebound.  Musculoskeletal: Normal range of motion. She exhibits no edema.  Lymphadenopathy:    She has no cervical adenopathy.  Neurological: She is oriented  to person, place, and time. Coordination normal.  Skin: No rash noted. No erythema.  Psychiatric: She has a normal mood and affect. Her behavior is normal.       Depressed.  Not suicidal or homicidal.    ED Course  Procedures (including critical care time) DIAGNOSTIC STUDIES: Oxygen Saturation is 100% on room air, normal by my interpretation.    COORDINATION OF CARE: 15:29- Patient informed of clinical course and that she will not be receiving opiate intervention for any complaints, understands  medical decision-making process, and agrees with plan.  Ordered rapid urine drug screen, CBC, c-met, and EtOH.    Labs Reviewed - No data to display Results for orders placed during the hospital encounter of 11/10/11  CBC WITH DIFFERENTIAL      Component Value Range   WBC 11.4 (*) 4.0 - 10.5 K/uL   RBC 4.95  3.87 - 5.11 MIL/uL   Hemoglobin 15.8 (*) 12.0 - 15.0 g/dL   HCT 44.9  36.0 - 46.0 %   MCV 90.7  78.0 - 100.0 fL   MCH 31.9  26.0 - 34.0 pg   MCHC 35.2  30.0 - 36.0 g/dL   RDW 13.1  11.5 - 15.5 %   Platelets 257  150 - 400 K/uL   Neutrophils Relative 80 (*) 43 - 77 %   Neutro Abs 9.1 (*) 1.7 - 7.7 K/uL   Lymphocytes Relative 16  12 - 46 %   Lymphs Abs 1.8  0.7 - 4.0 K/uL   Monocytes Relative 4  3 - 12 %   Monocytes Absolute 0.4  0.1 - 1.0 K/uL   Eosinophils Relative 1  0 - 5 %   Eosinophils Absolute 0.1  0.0 - 0.7 K/uL   Basophils Relative 0  0 - 1 %   Basophils Absolute 0.0  0.0 - 0.1 K/uL    No results found.   No diagnosis found.  Pt left ama  MDM   The chart was scribed for me under my direct supervision.  I personally performed the history, physical, and medical decision making and all procedures in the evaluation of this patient.Jamie Diego, MD 11/11/11 [phone]

## 2011-11-10 NOTE — ED Notes (Signed)
Pt presents asking to have help detoxing from opiates. Pt states last used yesterday.  3- 30 mg oxycodone (roxycodone)  Pt states hurts all-over.  Pt states parents kicked her out of the house for not following the rules. Been staying/bouncing around with friends. Pt states was here Sunday night into Monday, and signed AMA.

## 2011-11-10 NOTE — ED Notes (Signed)
Pt be becoming very anxious, Pt was placed in paper scrubs, and belongings taken from said pt, pt became more anxious

## 2011-11-10 NOTE — BH Assessment (Signed)
Assessment Note   Jamie Bell is an 41 y.o. female. The patient returns for detox today. She was in the Ed 11/09/11 and had been accepted at [facility] when she became very anxious and left AMA. Today she is tearful and upset. She complains of muscle aches, sweats, chills, nausea,and diarrhea. She is neither hallucinated nor delusional. She denies any suicidal thoughts nor any homicidal thoughts.  She continues to abuse opiates and last used yesterday morning. ..  Axis I:  Opoid Dependence;Substance Induced Mood Disorder Axis II: Deferred Axis III:  Past Medical History  Diagnosis Date  . Diabetes mellitus   . Chronic back pain   . Hypertension   . PCOS (polycystic ovarian syndrome)   . Depression   . DDD (degenerative disc disease)   . Sciatica    Axis IV: economic problems, housing problems, problems with access to health care services and problems with primary support group Axis V: 21-30 behavior considerably influenced by delusions or hallucinations OR serious impairment in judgment, communication OR inability to function in almost all areas  Past Medical History:  Past Medical History  Diagnosis Date  . Diabetes mellitus   . Chronic back pain   . Hypertension   . PCOS (polycystic ovarian syndrome)   . Depression   . DDD (degenerative disc disease)   . Sciatica     Past Surgical History  Procedure Date  . Back surgery   . Cholecystectomy   . Neck surgery   . Carpal tunnel release     Family History: No family history on file.  Social History:  reports that she has quit smoking. Her smoking use included Cigarettes. She has a 30 pack-year smoking history. She has never used smokeless tobacco. She reports that she does not drink alcohol or use illicit drugs.  Additional Social History:     CIWA: CIWA-Ar BP: 141/87 mmHg Pulse Rate: 99  COWS: Clinical Opiate Withdrawal Scale (COWS) Resting Pulse Rate: Pulse Rate 81-100 Sweating: Beads of sweat on brow or  face Restlessness: Reports difficulty sitting still, but is able to do so Pupil Size: Pupils possibly larger than normal for room light Bone or Joint Aches: Patient reports sever diffuse aching of joints/muscles Runny Nose or Tearing: Nasal stuffiness or unusually moist eyes GI Upset: No GI symptoms Tremor: Tremor can be felt, but not observed Yawning: Yawning once or twice during assessment Anxiety or Irritability: Patient reports increasing irritability or anxiousness Gooseflesh Skin: Piloerection of skin can be felt or hairs standing up on arms COWS Total Score: 15   Allergies:  Allergies  Allergen Reactions  . Ciprofloxacin Swelling    Home Medications:  (Not in a hospital admission)  OB/GYN Status:  Patient's last menstrual period was 10/27/2011.  General Assessment Data Location of Assessment: [facility] ACT Assessment: Yes Living Arrangements: Other (Comment) Can pt return to current living arrangement?: No Admission Status: Voluntary Is patient capable of signing voluntary admission?: Yes Transfer from: [facility] Referral Source: MD  Education Status Is patient currently in school?: No  Risk to self Suicidal Ideation: No Suicidal Intent: No Is patient at risk for suicide?: No Suicidal Plan?: No Access to Means: No What has been your use of drugs/alcohol within the last 12 months?: opiates Previous Attempts/Gestures: No How many times?: 0  Other Self Harm Risks: na Triggers for Past Attempts: None known Intentional Self Injurious Behavior: None Family Suicide History: No Recent stressful life event(s): Other (Comment) (no place to live) Persecutory voices/beliefs?: No Depression:  Yes Depression Symptoms: Isolating;Loss of interest in usual pleasures;Feeling worthless/self pity;Feeling angry/irritable;Fatigue;Guilt;Tearfulness Substance abuse history and/or treatment for substance abuse?: Yes Suicide prevention information given to non-admitted patients:  Not applicable  Risk to Others Homicidal Ideation: No Thoughts of Harm to Others: No Current Homicidal Intent: No Current Homicidal Plan: No Access to Homicidal Means: No History of harm to others?: No Assessment of Violence: None Noted Does patient have access to weapons?: No Criminal Charges Pending?: No Does patient have a court date: No  Psychosis Hallucinations: None noted Delusions: None noted  Mental Status Report Appear/Hygiene: Disheveled Eye Contact: Fair Motor Activity: Agitation;Restlessness;Freedom of movement Speech: Rapid;Logical/coherent Level of Consciousness: Alert;Irritable;Crying;Restless Mood: Depressed;Despair;Sad;Helpless Affect: Anxious;Depressed;Irritable Anxiety Level: Moderate Thought Processes: Coherent;Relevant Judgement: Impaired Orientation: Person;Place;Time;Situation Obsessive Compulsive Thoughts/Behaviors: Severe  Cognitive Functioning Concentration: Normal Memory: Recent Intact;Remote Intact IQ: Average Insight: Poor Impulse Control: Poor Appetite: Good Weight Loss: 0  Weight Gain: 0  Sleep: No Change Total Hours of Sleep: 4  Vegetative Symptoms: Decreased grooming  ADLScreening [facility] Assessment Services) Patient's cognitive ability adequate to safely complete daily activities?: Yes Patient able to express need for assistance with ADLs?: Yes Independently performs ADLs?: Yes (appropriate for developmental age)  Abuse/Neglect [facility]) Physical Abuse: Denies Verbal Abuse: Denies Sexual Abuse: Denies  Prior Inpatient Therapy Prior Inpatient Therapy: Yes Prior Therapy Dates: 06/17/2011 Prior Therapy Facilty/Provider(s): [facility] Reason for Treatment: OPIATE DETOX  Prior Outpatient Therapy Prior Outpatient Therapy: No Prior Therapy Dates: NA Prior Therapy Facilty/Provider(s): NA Reason for Treatment: NA  ADL Screening (condition at time of admission) Patient's cognitive ability adequate to safely complete daily activities?:  Yes Patient able to express need for assistance with ADLs?: Yes Independently performs ADLs?: Yes (appropriate for developmental age)       Abuse/Neglect Assessment (Assessment to be complete while patient is alone) Physical Abuse: Denies Verbal Abuse: Denies Sexual Abuse: Denies Values / Beliefs Cultural Requests During Hospitalization: None Spiritual Requests During Hospitalization: None        Additional Information 1:1 In Past 12 Months?: No CIRT Risk: No Elopement Risk: No Does patient have medical clearance?: Yes     Disposition:  Disposition Disposition of Patient: Inpatient treatment program Type of inpatient treatment program: Adult (referred to [facility])  On Site Evaluation by:   Reviewed with Physician:     Loretha Stapler 11/10/2011 3:46 PM

## 2011-11-10 NOTE — ED Notes (Signed)
Pt left AMA,stated she has an appointment in [place] on Monday to get Suboxin or methadone. To aid in her addiction. Pt could not be talked into staying.

## 2011-11-10 NOTE — ED Notes (Signed)
Pt called to be placed in a room. No answer.

## 2011-11-30 ENCOUNTER — Ambulatory Visit (INDEPENDENT_AMBULATORY_CARE_PROVIDER_SITE_OTHER): Payer: Medicare Other | Admitting: Family Medicine

## 2011-11-30 ENCOUNTER — Encounter: Payer: Self-pay | Admitting: Family Medicine

## 2011-11-30 VITALS — BP 154/82 | HR 86 | Temp 98.2°F | Ht 65.0 in | Wt 192.7 lb

## 2011-11-30 DIAGNOSIS — F1120 Opioid dependence, uncomplicated: Secondary | ICD-10-CM

## 2011-11-30 DIAGNOSIS — Z79899 Other long term (current) drug therapy: Secondary | ICD-10-CM

## 2011-11-30 DIAGNOSIS — I10 Essential (primary) hypertension: Secondary | ICD-10-CM

## 2011-11-30 DIAGNOSIS — E119 Type 2 diabetes mellitus without complications: Secondary | ICD-10-CM

## 2011-11-30 LAB — POCT GLYCOSYLATED HEMOGLOBIN (HGB A1C): Hemoglobin A1C: 8.6

## 2011-11-30 MED ORDER — METFORMIN HCL 500 MG PO TABS: 500.0000 mg | ORAL_TABLET | Freq: Two times a day (BID) | ORAL | Status: DC

## 2011-11-30 MED ORDER — LISINOPRIL 5 MG PO TABS: 5.0000 mg | ORAL_TABLET | Freq: Every day | ORAL | Status: DC

## 2011-11-30 MED ORDER — ACCU-CHEK SAFE-T PRO LANCETS MISC: Status: DC

## 2011-11-30 MED ORDER — BLOOD GLUC METER DISP-STRIPS DEVI: 1.0000 "application " | Freq: Two times a day (BID) | Status: DC

## 2011-11-30 NOTE — Assessment & Plan Note (Addendum)
Pt's long history of opiate abuse and recent finished detox with current seeking behavior and declining rehabilitation. Plan:  Extensively discussed with pt that acting on her best interest WE WILL NOT prescribe OPIOIDS for her chronic pain. We offered physical therapy and NSAID's(up to some extent taking in consideration her DM and HTN)

## 2011-11-30 NOTE — Progress Notes (Signed)
Family Medicine Office Visit Note   Subjective:   Patient ID: Jamie Bell, female  DOB: [birth year], 41 y.o.. MRN: [id number]   Pt that comes today to re-establish care. She states that she was diagnosed with DM and HTN about 7-8 years ago and was taking Metformin 500 mg BID as well as Lisinopril (unknown dose). She also reports has been out of her medications and not following this conditions for ~ 2-3 months. Denies polyuria, polydipsia or polyphagia. She has an old glucometer with no strips left but reports CBG's in the 300-400's.   She also has been on [facility] and discharged "per pt's own statement: due to drug abuse behavior and trafficking". She has been on a rehab program that finished on May, 8th /2013. Admitted to inpatient detox [facility] from 10/14 leaving AMA and returning for admission on 10/15 there is no D/C summary available.  She reports has lately obtained Opioids for her chronic back pain at differents Urgent Care and EDs and is asking [place] to prescribe this for her.   She states she went to "[facility]" (?) for a prescription for Suboxone and was told that her "levels of Roxicodone were so high that they could not prescribe this medication for her". She ask if we can prescribe this medication and declines any other resource for addiction detox at this time.   Review of Systems:  Pt denies SOB, chest pain, palpitations, headaches, dizziness, numbness or weakness. No changes on BM habits. No unintentional weigh loss/gain.  Objective:   Physical Exam: Gen:  NAD. Androgenic features. Hirsutism and female pattern alopecia. HEENT: Moist mucous membranes. Right Ear: Prominence of bony structures seen in TM. No perforation or drainage. Decreased hearing on right side. Neck base with 3 cm scar on right side(reported to be part of old neck surgery) CV: Regular rate and rhythm, no murmurs rubs or gallops PULM: Clear to auscultation bilaterally. No  wheezes/rales/rhonchi. ABD: Soft, non tender, non distended, normal bowel sounds EXT: No edema Neuro: Alert and oriented x3. No focalization Psych: Very labile affect. Denies suicidal ideations or plans.  Assessment & Plan:

## 2011-11-30 NOTE — Patient Instructions (Addendum)
Please take the medications as prescribed. I will call you with the labs results if they come back abnormal otherwise we will discuss them during your next appointment. Make your next appointment in 2-3 weeks.

## 2011-11-30 NOTE — Assessment & Plan Note (Signed)
Today 154/82. Goal is under 130/80. Pt has been on Lisinopril in the past with no side effects. Not clear if history of tyroid condition. Plan: CBC, CMET, TSH,  Lipid Profile. Lisinopril 5 mg daily. Will adjust dose depending on BP log. F/u in 15 days.

## 2011-11-30 NOTE — Assessment & Plan Note (Signed)
Diagnosis in the past not compliant and out of medications for months. No diet/exercise regimen. Plan: Start by getting labs and restart pt medication. Metformin 500 mg BID Will f/u in 15 days and adjust medication dose base on A1C results. Prescribed new meter, strips and lancets

## 2011-12-01 LAB — LIPID PANEL
Cholesterol: 167 mg/dL (ref 0–200)
HDL: 29 mg/dL — ABNORMAL LOW (ref >39)
Total CHOL/HDL Ratio: 5.8 Ratio
Triglycerides: 169 mg/dL — ABNORMAL HIGH (ref <150)
VLDL: 34 mg/dL (ref 0–40)

## 2011-12-01 LAB — COMPREHENSIVE METABOLIC PANEL
ALT: 42 U/L — ABNORMAL HIGH (ref 0–35)
AST: 53 U/L — ABNORMAL HIGH (ref 0–37)
Alkaline Phosphatase: 127 U/L — ABNORMAL HIGH (ref 39–117)
CO2: 28 mEq/L (ref 19–32)
Creat: 0.66 mg/dL (ref 0.50–1.10)
Total Bilirubin: 0.5 mg/dL (ref 0.3–1.2)

## 2011-12-01 LAB — CBC
MCH: 31.6 pg (ref 26.0–34.0)
MCHC: 34.3 g/dL (ref 30.0–36.0)
MCV: 92.1 fL (ref 78.0–100.0)
Platelets: 200 10*3/uL (ref 150–400)
RDW: 13.8 % (ref 11.5–15.5)

## 2011-12-22 ENCOUNTER — Ambulatory Visit: Payer: Medicare Other | Admitting: Family Medicine

## 2012-01-27 DIAGNOSIS — R7881 Bacteremia: Secondary | ICD-10-CM

## 2012-01-27 DIAGNOSIS — B9562 Methicillin resistant Staphylococcus aureus infection as the cause of diseases classified elsewhere: Secondary | ICD-10-CM

## 2012-01-27 HISTORY — DX: Bacteremia: R78.81

## 2012-01-27 HISTORY — DX: Methicillin resistant Staphylococcus aureus infection as the cause of diseases classified elsewhere: B95.62

## 2012-04-19 ENCOUNTER — Emergency Department (HOSPITAL_COMMUNITY)
Admission: EM | Admit: 2012-04-19 | Discharge: 2012-04-19 | Disposition: A | Discharge status: Home / Self Care | Payer: Medicare Other | Attending: Emergency Medicine | Admitting: Emergency Medicine

## 2012-04-19 ENCOUNTER — Encounter (HOSPITAL_COMMUNITY): Payer: Self-pay | Admitting: Emergency Medicine

## 2012-04-19 DIAGNOSIS — G8929 Other chronic pain: Secondary | ICD-10-CM | POA: Insufficient documentation

## 2012-04-19 DIAGNOSIS — I10 Essential (primary) hypertension: Secondary | ICD-10-CM | POA: Insufficient documentation

## 2012-04-19 DIAGNOSIS — M545 Low back pain, unspecified: Secondary | ICD-10-CM | POA: Insufficient documentation

## 2012-04-19 DIAGNOSIS — R109 Unspecified abdominal pain: Secondary | ICD-10-CM | POA: Insufficient documentation

## 2012-04-19 DIAGNOSIS — Z8742 Personal history of other diseases of the female genital tract: Secondary | ICD-10-CM | POA: Insufficient documentation

## 2012-04-19 DIAGNOSIS — Z8659 Personal history of other mental and behavioral disorders: Secondary | ICD-10-CM | POA: Insufficient documentation

## 2012-04-19 DIAGNOSIS — Z8739 Personal history of other diseases of the musculoskeletal system and connective tissue: Secondary | ICD-10-CM | POA: Insufficient documentation

## 2012-04-19 DIAGNOSIS — Z87891 Personal history of nicotine dependence: Secondary | ICD-10-CM | POA: Insufficient documentation

## 2012-04-19 DIAGNOSIS — H9190 Unspecified hearing loss, unspecified ear: Secondary | ICD-10-CM | POA: Insufficient documentation

## 2012-04-19 DIAGNOSIS — Z9889 Other specified postprocedural states: Secondary | ICD-10-CM | POA: Insufficient documentation

## 2012-04-19 DIAGNOSIS — E119 Type 2 diabetes mellitus without complications: Secondary | ICD-10-CM | POA: Insufficient documentation

## 2012-04-19 DIAGNOSIS — Z9089 Acquired absence of other organs: Secondary | ICD-10-CM | POA: Insufficient documentation

## 2012-04-19 DIAGNOSIS — Z79899 Other long term (current) drug therapy: Secondary | ICD-10-CM | POA: Insufficient documentation

## 2012-04-19 MED ORDER — IBUPROFEN 800 MG PO TABS: 800.0000 mg | ORAL_TABLET | Freq: Three times a day (TID) | ORAL | Status: DC

## 2012-04-19 MED ORDER — IBUPROFEN 100 MG/5ML PO SUSP
800.0000 mg | Freq: Once | ORAL | Status: DC
  Filled 2012-04-19: qty 40

## 2012-04-19 MED ORDER — IBUPROFEN 800 MG PO TABS
800.0000 mg | ORAL_TABLET | Freq: Once | ORAL | Status: AC
  Administered 2012-04-19: 800 mg via ORAL
  Filled 2012-04-19: qty 1

## 2012-04-19 MED ORDER — HYDROCODONE-ACETAMINOPHEN 5-325 MG PO TABS
1.0000 | ORAL_TABLET | Freq: Once | ORAL | Status: AC
  Administered 2012-04-19: 1 via ORAL
  Filled 2012-04-19: qty 1

## 2012-04-19 MED ORDER — HYDROCODONE-ACETAMINOPHEN 5-325 MG PO TABS: 1.0000 | ORAL_TABLET | ORAL | Status: DC | PRN

## 2012-04-19 NOTE — ED Notes (Signed)
Pt c/o lower abd pain that started Saturday evening and since then it has radiated to the left flank and back area.

## 2012-04-19 NOTE — ED Notes (Signed)
Discharge instructions reviewed with pt, questions answered. Pt verbalized understanding.  

## 2012-04-19 NOTE — ED Provider Notes (Signed)
History     CSN: [id number]  Arrival date & time 04/19/12  0012   First MD Initiated Contact with Patient 04/19/12 0018      Chief Complaint  Patient presents with  . Flank Pain  . Back Pain    (Consider location/radiation/quality/duration/timing/severity/associated sxs/prior treatment) HPI Jamie Bell is a 42 y.o. female who presents to the Emergency Department complaining of left flank pain that is worse with movement and has been present since Saturday. She is not aware of any unusual activity. Pain increases with coughing or movement.  PCP Dr. Wallace Cullens de Gwendalyn Ege Past Medical History  Diagnosis Date  . Diabetes mellitus   . Chronic back pain   . Hypertension   . PCOS (polycystic ovarian syndrome)   . Depression   . DDD (degenerative disc disease)   . Sciatica   . Decreased hearing on the right.    F/u by ENT in the past and no further management.    Past Surgical History  Procedure Laterality Date  . Back surgery    . Cholecystectomy    . Neck surgery    . Carpal tunnel release      History reviewed. No pertinent family history.  History  Substance Use Topics  . Smoking status: Former Smoker -- 1.50 packs/day for 20 years    Types: Cigarettes  . Smokeless tobacco: Never Used     Comment: pain pills  . Alcohol Use: No    OB History   Grav Para Term Preterm Abortions TAB SAB Ect Mult Living            0      Review of Systems  Constitutional: Negative for fever.       10 Systems reviewed and are negative for acute change except as noted in the HPI.  HENT: Negative for congestion.   Eyes: Negative for discharge and redness.  Respiratory: Negative for cough and shortness of breath.   Cardiovascular: Negative for chest pain.  Gastrointestinal: Negative for vomiting and abdominal pain.  Genitourinary: Positive for flank pain.  Musculoskeletal: Negative for back pain.  Skin: Negative for rash.  Neurological: Negative for syncope, numbness and  headaches.  Psychiatric/Behavioral:       No behavior change.    Allergies  Ciprofloxacin  Home Medications   Current Outpatient Rx  Name  Route  Sig  Dispense  Refill  . Blood Gluc Meter Disp-Strips (BLOOD GLUCOSE METER DISPOSABLE) DEVI   Does not apply   1 application by Does not apply route 2 (two) times daily.   60 each   3   . Lancets (ACCU-CHEK SAFE-T PRO) lancets      Use as instructed   100 each   12   . lisinopril (PRINIVIL,ZESTRIL) 5 MG tablet   Oral   Take 1 tablet (5 mg total) by mouth daily.   30 tablet   3   . metFORMIN (GLUCOPHAGE) 500 MG tablet   Oral   Take 1 tablet (500 mg total) by mouth 2 (two) times daily. For diabetes   60 tablet   3     BP 148/101  Pulse 137  Temp(Src) 99.6 F (37.6 C) (Oral)  Resp 20  SpO2 98%  LMP 04/17/2012  Physical Exam  Nursing note and vitals reviewed. Constitutional: She appears well-developed and well-nourished.  Awake, alert, nontoxic appearance.  HENT:  Head: Normocephalic and atraumatic.  Eyes: Right eye exhibits no discharge. Left eye exhibits no discharge.  Neck: Neck  supple.  Cardiovascular: Normal rate.   Pulmonary/Chest: Effort normal and breath sounds normal. She exhibits no tenderness.  Abdominal: Soft. There is no tenderness. There is no rebound.  Musculoskeletal: She exhibits no tenderness.  Baseline ROM, no obvious new focal weakness.Pain with palpation along the left flank area.   Neurological:  Mental status and motor strength appears baseline for patient and situation.  Skin: No rash noted.  Psychiatric: She has a normal mood and affect.    ED Course  Procedures (including critical care time)     MDM  Patient with left flank area pain worse with movement. Musculoskeletal pain. Given ibuprofen and hydrocodone. Pt stable in ED with no significant deterioration in condition.The patient appears reasonably screened and/or stabilized for discharge and I doubt any other medical condition  or other [facility] requiring further screening, evaluation, or treatment in the ED at this time prior to discharge.  MDM Reviewed: nursing note and vitals           Gypsy Balsam. Olin Hauser, MD 04/19/12 [id number]

## 2012-05-02 ENCOUNTER — Encounter (HOSPITAL_COMMUNITY): Payer: Self-pay | Admitting: Emergency Medicine

## 2012-05-02 ENCOUNTER — Emergency Department (HOSPITAL_COMMUNITY)
Admission: EM | Admit: 2012-05-02 | Discharge: 2012-05-02 | Disposition: A | Discharge status: Home / Self Care | Payer: Medicare Other | Attending: Emergency Medicine | Admitting: Emergency Medicine

## 2012-05-02 DIAGNOSIS — I10 Essential (primary) hypertension: Secondary | ICD-10-CM | POA: Insufficient documentation

## 2012-05-02 DIAGNOSIS — F1110 Opioid abuse, uncomplicated: Secondary | ICD-10-CM | POA: Insufficient documentation

## 2012-05-02 DIAGNOSIS — H9190 Unspecified hearing loss, unspecified ear: Secondary | ICD-10-CM | POA: Insufficient documentation

## 2012-05-02 DIAGNOSIS — Z79899 Other long term (current) drug therapy: Secondary | ICD-10-CM | POA: Insufficient documentation

## 2012-05-02 DIAGNOSIS — Z8659 Personal history of other mental and behavioral disorders: Secondary | ICD-10-CM | POA: Insufficient documentation

## 2012-05-02 DIAGNOSIS — F17200 Nicotine dependence, unspecified, uncomplicated: Secondary | ICD-10-CM | POA: Insufficient documentation

## 2012-05-02 DIAGNOSIS — F1910 Other psychoactive substance abuse, uncomplicated: Secondary | ICD-10-CM

## 2012-05-02 DIAGNOSIS — Z8739 Personal history of other diseases of the musculoskeletal system and connective tissue: Secondary | ICD-10-CM | POA: Insufficient documentation

## 2012-05-02 DIAGNOSIS — E119 Type 2 diabetes mellitus without complications: Secondary | ICD-10-CM | POA: Insufficient documentation

## 2012-05-02 DIAGNOSIS — G8929 Other chronic pain: Secondary | ICD-10-CM | POA: Insufficient documentation

## 2012-05-02 DIAGNOSIS — Z3202 Encounter for pregnancy test, result negative: Secondary | ICD-10-CM | POA: Insufficient documentation

## 2012-05-02 LAB — CBC WITH DIFFERENTIAL/PLATELET
Eosinophils Absolute: 0.1 10*3/uL (ref 0.0–0.7)
HCT: 38.9 % (ref 36.0–46.0)
Hemoglobin: 13.6 g/dL (ref 12.0–15.0)
Lymphs Abs: 2.5 10*3/uL (ref 0.7–4.0)
MCH: 31.3 pg (ref 26.0–34.0)
Monocytes Relative: 6 % (ref 3–12)
Neutrophils Relative %: 71 % (ref 43–77)
RBC: 4.34 MIL/uL (ref 3.87–5.11)

## 2012-05-02 LAB — BASIC METABOLIC PANEL
BUN: 13 mg/dL (ref 6–23)
Calcium: 9.4 mg/dL (ref 8.4–10.5)
Chloride: 99 mEq/L (ref 96–112)
Creatinine, Ser: 0.85 mg/dL (ref 0.50–1.10)
GFR calc Af Amer: >90 mL/min (ref >90)
GFR calc non Af Amer: 85 mL/min — ABNORMAL LOW (ref >90)
Glucose, Bld: 173 mg/dL — ABNORMAL HIGH (ref 70–99)
Potassium: 3.5 mEq/L (ref 3.5–5.1)

## 2012-05-02 LAB — RAPID URINE DRUG SCREEN, HOSP PERFORMED
Amphetamines: NOT DETECTED
Opiates: NOT DETECTED

## 2012-05-02 LAB — PREGNANCY, URINE: Preg Test, Ur: NEGATIVE

## 2012-05-02 LAB — ETHANOL: Alcohol, Ethyl (B): <11 mg/dL (ref 0–11)

## 2012-05-02 NOTE — ED Notes (Signed)
Pt states she wants to detox from opiates. States she last had any today. Pt denies SI or HI. States opiates have ruined her life.

## 2012-05-02 NOTE — ED Provider Notes (Signed)
History     CSN: [id number]  Arrival date & time 05/02/12  0127   First MD Initiated Contact with Patient 05/02/12 0210      Chief Complaint  Patient presents with  . Medical Clearance    (Consider location/radiation/quality/duration/timing/severity/associated sxs/prior treatment) HPI Jamie Bell is a 42 y.o. female with a h/o DM, chornic back pain, HTN, PCOS,  who presents to the Emergency Department complaining of needing detox of opiates. She denies suicidal ideation, homicidal ideation. She is not psychotic. Past Medical History  Diagnosis Date  . Diabetes mellitus   . Chronic back pain   . Hypertension   . PCOS (polycystic ovarian syndrome)   . Depression   . DDD (degenerative disc disease)   . Sciatica   . Decreased hearing on the right.    F/u by ENT in the past and no further management.    Past Surgical History  Procedure Laterality Date  . Back surgery    . Cholecystectomy    . Neck surgery    . Carpal tunnel release    . Breast surgery      History reviewed. No pertinent family history.  History  Substance Use Topics  . Smoking status: Current Every Day Smoker -- 1.50 packs/day for 20 years    Types: Cigarettes  . Smokeless tobacco: Never Used     Comment: pain pills  . Alcohol Use: No    OB History   Grav Para Term Preterm Abortions TAB SAB Ect Mult Living            0      Review of Systems  Constitutional: Negative for fever.       10 Systems reviewed and are negative for acute change except as noted in the HPI.  HENT: Negative for congestion.   Eyes: Negative for discharge and redness.  Respiratory: Negative for cough and shortness of breath.   Cardiovascular: Negative for chest pain.  Gastrointestinal: Negative for vomiting and abdominal pain.  Musculoskeletal: Negative for back pain.  Skin: Negative for rash.  Neurological: Negative for syncope, numbness and headaches.  Psychiatric/Behavioral:       No behavior change.     Allergies  Ciprofloxacin and Nsaids  Home Medications   Current Outpatient Rx  Name  Route  Sig  Dispense  Refill  . Blood Gluc Meter Disp-Strips (BLOOD GLUCOSE METER DISPOSABLE) DEVI   Does not apply   1 application by Does not apply route 2 (two) times daily.   60 each   3   . HYDROcodone-acetaminophen (NORCO/VICODIN) 5-325 MG per tablet   Oral   Take 1 tablet by mouth every 4 (four) hours as needed for pain.   15 tablet   0   . ibuprofen (ADVIL,MOTRIN) 800 MG tablet   Oral   Take 1 tablet (800 mg total) by mouth 3 (three) times daily.   21 tablet   0   . Lancets (ACCU-CHEK SAFE-T PRO) lancets      Use as instructed   100 each   12   . lisinopril (PRINIVIL,ZESTRIL) 5 MG tablet   Oral   Take 1 tablet (5 mg total) by mouth daily.   30 tablet   3   . metFORMIN (GLUCOPHAGE) 500 MG tablet   Oral   Take 1 tablet (500 mg total) by mouth 2 (two) times daily. For diabetes   60 tablet   3     BP 138/87  Pulse 100  Temp(Src) 99.4  F (37.4 C) (Oral)  Resp 17  Ht 5' 5"  (1.651 m)  Wt 189 lb (85.73 kg)  BMI 31.45 kg/m2  SpO2 97%  LMP 04/13/2012  Physical Exam  Nursing note and vitals reviewed. Constitutional: She appears well-developed and well-nourished.  Awake, alert, nontoxic appearance.  HENT:  Head: Normocephalic and atraumatic.  Right Ear: External ear normal.  Left Ear: External ear normal.  Mouth/Throat: Oropharynx is clear and moist.  Eyes: EOM are normal. Pupils are equal, round, and reactive to light. Right eye exhibits no discharge. Left eye exhibits no discharge.  Neck: Neck supple.  Cardiovascular: Normal rate and intact distal pulses.   Pulmonary/Chest: Effort normal and breath sounds normal. She exhibits no tenderness.  Abdominal: Soft. Bowel sounds are normal. There is no tenderness. There is no rebound.  Musculoskeletal: She exhibits no tenderness.  Baseline ROM, no obvious new focal weakness.  Neurological:  Mental status and motor  strength appears baseline for patient and situation.  Skin: No rash noted.  Psychiatric: She has a normal mood and affect.    ED Course  Procedures (including critical care time) Results for orders placed during the hospital encounter of 05/02/12  CBC WITH DIFFERENTIAL      Result Value Range   WBC 11.7 (*) 4.0 - 10.5 K/uL   RBC 4.34  3.87 - 5.11 MIL/uL   Hemoglobin 13.6  12.0 - 15.0 g/dL   HCT 38.9  36.0 - 46.0 %   MCV 89.6  78.0 - 100.0 fL   MCH 31.3  26.0 - 34.0 pg   MCHC 35.0  30.0 - 36.0 g/dL   RDW 13.0  11.5 - 15.5 %   Platelets 167  150 - 400 K/uL   Neutrophils Relative 71  43 - 77 %   Neutro Abs 8.3 (*) 1.7 - 7.7 K/uL   Lymphocytes Relative 22  12 - 46 %   Lymphs Abs 2.5  0.7 - 4.0 K/uL   Monocytes Relative 6  3 - 12 %   Monocytes Absolute 0.7  0.1 - 1.0 K/uL   Eosinophils Relative 1  0 - 5 %   Eosinophils Absolute 0.1  0.0 - 0.7 K/uL   Basophils Relative 0  0 - 1 %   Basophils Absolute 0.0  0.0 - 0.1 K/uL  BASIC METABOLIC PANEL      Result Value Range   Sodium 136  135 - 145 mEq/L   Potassium 3.5  3.5 - 5.1 mEq/L   Chloride 99  96 - 112 mEq/L   CO2 25  19 - 32 mEq/L   Glucose, Bld 173 (*) 70 - 99 mg/dL   BUN 13  6 - 23 mg/dL   Creatinine, Ser 0.85  0.50 - 1.10 mg/dL   Calcium 9.4  8.4 - 10.5 mg/dL   GFR calc non Af Amer 85 (*) >90 mL/min   GFR calc Af Amer >90  >90 mL/min  ETHANOL      Result Value Range   Alcohol, Ethyl (B) <11  0 - 11 mg/dL  URINE RAPID DRUG SCREEN (HOSP PERFORMED)      Result Value Range   Opiates NONE DETECTED  NONE DETECTED   Cocaine NONE DETECTED  NONE DETECTED   Benzodiazepines NONE DETECTED  NONE DETECTED   Amphetamines NONE DETECTED  NONE DETECTED   Tetrahydrocannabinol NONE DETECTED  NONE DETECTED   Barbiturates NONE DETECTED  NONE DETECTED  PREGNANCY, URINE      Result Value Range   Preg  Test, Ur NEGATIVE  NEGATIVE      [phone] Advised patient that her UDS was negative. She can be seen at [facility].  MDM  Patient here for  detox from opiates. UDS negative. ETOH negative. Spoke with patient re Chinita Pester if she is seeking detox requiring in patient therapy.Pt stable in ED with no significant deterioration in condition.The patient appears reasonably screened and/or stabilized for discharge and I doubt any other medical condition or other [facility] requiring further screening, evaluation, or treatment in the ED at this time prior to discharge.  MDM Reviewed: nursing note and vitals Interpretation: labs           Gypsy Balsam. Olin Hauser, MD 05/02/12 [phone]

## 2012-08-03 ENCOUNTER — Encounter (HOSPITAL_COMMUNITY): Payer: Self-pay | Admitting: *Deleted

## 2012-08-03 ENCOUNTER — Emergency Department (HOSPITAL_COMMUNITY): Payer: Medicare Other

## 2012-08-03 ENCOUNTER — Inpatient Hospital Stay (HOSPITAL_COMMUNITY)
Admission: EM | Admit: 2012-08-03 | Discharge: 2012-08-10 | DRG: 872 | Disposition: A | Discharge status: Home / Self Care | Payer: Medicare Other | Attending: Internal Medicine | Admitting: Internal Medicine

## 2012-08-03 DIAGNOSIS — F17200 Nicotine dependence, unspecified, uncomplicated: Secondary | ICD-10-CM | POA: Diagnosis present

## 2012-08-03 DIAGNOSIS — Z6834 Body mass index (BMI) 34.0-34.9, adult: Secondary | ICD-10-CM

## 2012-08-03 DIAGNOSIS — E119 Type 2 diabetes mellitus without complications: Secondary | ICD-10-CM | POA: Diagnosis present

## 2012-08-03 DIAGNOSIS — F1111 Opioid abuse, in remission: Secondary | ICD-10-CM

## 2012-08-03 DIAGNOSIS — I10 Essential (primary) hypertension: Secondary | ICD-10-CM | POA: Diagnosis present

## 2012-08-03 DIAGNOSIS — E282 Polycystic ovarian syndrome: Secondary | ICD-10-CM | POA: Diagnosis present

## 2012-08-03 DIAGNOSIS — H9190 Unspecified hearing loss, unspecified ear: Secondary | ICD-10-CM | POA: Diagnosis present

## 2012-08-03 DIAGNOSIS — M549 Dorsalgia, unspecified: Secondary | ICD-10-CM

## 2012-08-03 DIAGNOSIS — F1120 Opioid dependence, uncomplicated: Secondary | ICD-10-CM | POA: Diagnosis present

## 2012-08-03 DIAGNOSIS — Z981 Arthrodesis status: Secondary | ICD-10-CM

## 2012-08-03 DIAGNOSIS — R030 Elevated blood-pressure reading, without diagnosis of hypertension: Secondary | ICD-10-CM

## 2012-08-03 DIAGNOSIS — A419 Sepsis, unspecified organism: Secondary | ICD-10-CM | POA: Diagnosis present

## 2012-08-03 DIAGNOSIS — F1210 Cannabis abuse, uncomplicated: Secondary | ICD-10-CM | POA: Diagnosis present

## 2012-08-03 DIAGNOSIS — M545 Low back pain, unspecified: Secondary | ICD-10-CM

## 2012-08-03 DIAGNOSIS — L0292 Furuncle, unspecified: Secondary | ICD-10-CM | POA: Diagnosis present

## 2012-08-03 DIAGNOSIS — R0789 Other chest pain: Secondary | ICD-10-CM

## 2012-08-03 DIAGNOSIS — M25519 Pain in unspecified shoulder: Secondary | ICD-10-CM | POA: Diagnosis present

## 2012-08-03 DIAGNOSIS — R071 Chest pain on breathing: Secondary | ICD-10-CM

## 2012-08-03 DIAGNOSIS — G894 Chronic pain syndrome: Secondary | ICD-10-CM | POA: Diagnosis present

## 2012-08-03 DIAGNOSIS — A4102 Sepsis due to Methicillin resistant Staphylococcus aureus: Principal | ICD-10-CM | POA: Diagnosis present

## 2012-08-03 DIAGNOSIS — R7881 Bacteremia: Secondary | ICD-10-CM

## 2012-08-03 DIAGNOSIS — E669 Obesity, unspecified: Secondary | ICD-10-CM | POA: Diagnosis present

## 2012-08-03 DIAGNOSIS — F3289 Other specified depressive episodes: Secondary | ICD-10-CM | POA: Diagnosis present

## 2012-08-03 DIAGNOSIS — IMO0002 Reserved for concepts with insufficient information to code with codable children: Secondary | ICD-10-CM | POA: Diagnosis present

## 2012-08-03 DIAGNOSIS — F329 Major depressive disorder, single episode, unspecified: Secondary | ICD-10-CM | POA: Diagnosis present

## 2012-08-03 DIAGNOSIS — R222 Localized swelling, mass and lump, trunk: Secondary | ICD-10-CM | POA: Diagnosis present

## 2012-08-03 DIAGNOSIS — IMO0001 Reserved for inherently not codable concepts without codable children: Secondary | ICD-10-CM | POA: Diagnosis present

## 2012-08-03 LAB — URINALYSIS, ROUTINE W REFLEX MICROSCOPIC
Glucose, UA: >1000 mg/dL — AB
Ketones, ur: NEGATIVE mg/dL
Leukocytes, UA: NEGATIVE
Nitrite: NEGATIVE
Specific Gravity, Urine: 1.02 (ref 1.005–1.030)
pH: 6 (ref 5.0–8.0)

## 2012-08-03 LAB — CBC WITH DIFFERENTIAL/PLATELET
Basophils Absolute: 0 10*3/uL (ref 0.0–0.1)
Basophils Relative: 0 % (ref 0–1)
Eosinophils Absolute: 0 10*3/uL (ref 0.0–0.7)
MCH: 31.8 pg (ref 26.0–34.0)
MCHC: 36 g/dL (ref 30.0–36.0)
Monocytes Absolute: 0.5 10*3/uL (ref 0.1–1.0)
Monocytes Relative: 5 % (ref 3–12)
Neutro Abs: 6.7 10*3/uL (ref 1.7–7.7)
Neutrophils Relative %: 69 % (ref 43–77)
RDW: 13.5 % (ref 11.5–15.5)

## 2012-08-03 LAB — BASIC METABOLIC PANEL
BUN: 7 mg/dL (ref 6–23)
Calcium: 8.8 mg/dL (ref 8.4–10.5)
GFR calc Af Amer: >90 mL/min (ref >90)
GFR calc non Af Amer: >90 mL/min (ref >90)
Glucose, Bld: 272 mg/dL — ABNORMAL HIGH (ref 70–99)
Potassium: 2.7 mEq/L — CL (ref 3.5–5.1)
Sodium: 130 mEq/L — ABNORMAL LOW (ref 135–145)

## 2012-08-03 LAB — URINE MICROSCOPIC-ADD ON

## 2012-08-03 MED ORDER — MORPHINE SULFATE 4 MG/ML IJ SOLN
4.0000 mg | Freq: Once | INTRAMUSCULAR | Status: AC
  Administered 2012-08-03: 4 mg via INTRAVENOUS
  Filled 2012-08-03: qty 1

## 2012-08-03 MED ORDER — POTASSIUM CHLORIDE CRYS ER 20 MEQ PO TBCR
40.0000 meq | EXTENDED_RELEASE_TABLET | Freq: Once | ORAL | Status: AC
  Administered 2012-08-03: 40 meq via ORAL
  Filled 2012-08-03: qty 2

## 2012-08-03 MED ORDER — DEXTROSE 5 % IV SOLN
1.0000 g | Freq: Once | INTRAVENOUS | Status: AC
  Administered 2012-08-03: 1 g via INTRAVENOUS
  Filled 2012-08-03: qty 10

## 2012-08-03 MED ORDER — DEXTROSE 5 % IV SOLN
500.0000 mg | Freq: Once | INTRAVENOUS | Status: AC
  Administered 2012-08-03: 500 mg via INTRAVENOUS
  Filled 2012-08-03: qty 500

## 2012-08-03 MED ORDER — IOHEXOL 350 MG/ML SOLN
100.0000 mL | Freq: Once | INTRAVENOUS | Status: AC | PRN
  Administered 2012-08-03: 100 mL via INTRAVENOUS

## 2012-08-03 MED ORDER — HYDROMORPHONE HCL PF 1 MG/ML IJ SOLN
1.0000 mg | INTRAMUSCULAR | Status: DC | PRN
  Administered 2012-08-04: 1 mg via INTRAVENOUS
  Filled 2012-08-03: qty 1

## 2012-08-03 MED ORDER — OXYCODONE-ACETAMINOPHEN 5-325 MG PO TABS
2.0000 | ORAL_TABLET | Freq: Once | ORAL | Status: AC
  Administered 2012-08-03: 2 via ORAL
  Filled 2012-08-03: qty 2

## 2012-08-03 MED ORDER — SODIUM CHLORIDE 0.9 % IV BOLUS (SEPSIS)
1000.0000 mL | Freq: Once | INTRAVENOUS | Status: AC
  Administered 2012-08-03: 1000 mL via INTRAVENOUS

## 2012-08-03 NOTE — ED Provider Notes (Signed)
History  This chart was scribed for Jamie Christen, MD by Elby Beck, ED Scribe. This patient was seen in room APA12/AP[facility]12 and the patient's care was started at 5:42 PM.  CSN: [id number]  Arrival date & time 08/03/12  1435   Chief Complaint  Patient presents with  . Chest Pain    The history is provided by the patient. No language interpreter was used.   HPI Comments: Jamie Bell is a 42 y.o. Female with a hx of DM, HTN and chronic back pain who presents to the Emergency Department complaining of gradual onset, gradually worsening, constant, moderate center chest pain, that radiates to the left side of her chest and her left arm of 5 days duration. Pt describes her chest pain as soreness. Pt states that her pain is severely worsened with movement and palpation. Pt reports associated nausea as well, but denies vomiting. Pt is a current every day smoker of 1.5 packs/day and admitted to injecting IV drugs recently. Pt has a boil to her left antecubital region as well as to her left thigh which are slowly healing. Pt reports chronic back pain and a hx of back surgery, and she states she is on disability. Pt states that her blood sugar is normally in the 200's and she states she does not take anything for her diabetes, and she does not have a PCP. Pt states that she used to manage her diabetes with Metformin. She also reports generalized weakness in the left side of her body. Pt denies SOB, diaphoresis, abdominal pain, rash, fever or any other symptoms. Pt has a prior hx of using marijuana, cocaine and pain pills.  PCP- Gwendolyn Fill, MD   Past Medical History  Diagnosis Date  . Diabetes mellitus   . Chronic back pain   . Hypertension   . PCOS (polycystic ovarian syndrome)   . Depression   . DDD (degenerative disc disease)   . Sciatica   . Decreased hearing on the right.    F/u by ENT in the past and no further management.   Past Surgical History  Procedure Laterality Date  .  Back surgery    . Cholecystectomy    . Neck surgery    . Carpal tunnel release    . Breast surgery     No family history on file. History  Substance Use Topics  . Smoking status: Current Every Day Smoker -- 1.50 packs/day for 20 years    Types: Cigarettes  . Smokeless tobacco: Never Used     Comment: pain pills  . Alcohol Use: No   OB History   Grav Para Term Preterm Abortions TAB SAB Ect Mult Living            0     Review of Systems  Constitutional: Negative for fever and chills.  HENT: Negative for congestion, sore throat, rhinorrhea and neck pain.   Eyes: Negative for visual disturbance.  Respiratory: Negative for cough and shortness of breath.   Cardiovascular: Positive for chest pain.  Gastrointestinal: Positive for nausea. Negative for vomiting, abdominal pain and diarrhea.  Genitourinary: Negative for dysuria.  Skin: Positive for wound. Negative for rash.  Neurological: Positive for weakness. Negative for headaches.  Psychiatric/Behavioral: Negative for confusion.  A complete 10 system review of systems was obtained and all systems are negative except as noted in the HPI and PMH.   Allergies  Ciprofloxacin and Nsaids  Home Medications   Current Outpatient Rx  Name  Route  Sig  Dispense  Refill  . ibuprofen (ADVIL,MOTRIN) 200 MG tablet   Oral   Take 600 mg by mouth every 6 (six) hours as needed for pain.          Triage Vitals: BP 136/73  Pulse 123  Temp(Src) 98.6 F (37 C) (Oral)  Resp 20  Ht 5' 5"  (1.651 m)  Wt 195 lb (88.451 kg)  BMI 32.45 kg/m2  SpO2 98%  LMP 07/27/2012  Physical Exam  Nursing note and vitals reviewed. Constitutional: She is oriented to person, place, and time. She appears well-developed and well-nourished.  HENT:  Head: Normocephalic and atraumatic.  Eyes: Conjunctivae and EOM are normal. Pupils are equal, round, and reactive to light.  Neck: Normal range of motion. Neck supple.  Cardiovascular: Normal rate, regular rhythm  and normal heart sounds.   Pulmonary/Chest: Effort normal and breath sounds normal.  Chest wall appeared to have superior, bilateral tenderness. No masses appreciated on chest wall.  Abdominal: Soft. Bowel sounds are normal.  Musculoskeletal: Normal range of motion.  Neurological: She is alert and oriented to person, place, and time.  Skin: Skin is warm and dry.  6 mm area of erythema at the area of a needle injection to left antecubital region.  Psychiatric: She has a normal mood and affect.    ED Course  Procedures (including critical care time)  DIAGNOSTIC STUDIES: Oxygen Saturation is 98% on RA, normal by my interpretation.    COORDINATION OF CARE: 5:48 PM- Pt advised of plan to receive Zithromax and Rocephin, as well as plan to receive diagnostic lab work and radiology and pt agrees.  Medications  cefTRIAXone (ROCEPHIN) 1 g in dextrose 5 % 50 mL IVPB (0 g Intravenous Stopped 08/03/12 1938)  azithromycin (ZITHROMAX) 500 mg in dextrose 5 % 250 mL IVPB (0 mg Intravenous Stopped 08/03/12 2133)  potassium chloride SA (K-DUR,KLOR-CON) CR tablet 40 mEq (40 mEq Oral Given 08/03/12 1840)  oxyCODONE-acetaminophen (PERCOCET/ROXICET) 5-325 MG per tablet 2 tablet (2 tablets Oral Given 08/03/12 1839)  sodium chloride 0.9 % bolus 1,000 mL (1,000 mLs Intravenous New Bag/Given 08/03/12 2132)  iohexol (OMNIPAQUE) 350 MG/ML injection 100 mL (100 mLs Intravenous Contrast Given 08/03/12 2015)  morphine 4 MG/ML injection 4 mg (4 mg Intravenous Given 08/03/12 2132)    Labs Reviewed  URINALYSIS, ROUTINE W REFLEX MICROSCOPIC - Abnormal; Notable for the following:    Color, Urine AMBER (*)    Glucose, UA >1000 (*)    Hgb urine dipstick SMALL (*)    Bilirubin Urine LARGE (*)    All other components within normal limits  CBC WITH DIFFERENTIAL - Abnormal; Notable for the following:    Platelets 129 (*)    All other components within normal limits  BASIC METABOLIC PANEL - Abnormal; Notable for the following:     Sodium 130 (*)    Potassium 2.7 (*)    Chloride 94 (*)    Glucose, Bld 272 (*)    All other components within normal limits  URINE MICROSCOPIC-ADD ON - Abnormal; Notable for the following:    Squamous Epithelial / LPF FEW (*)    All other components within normal limits  TROPONIN I   Dg Chest 2 View  08/03/2012   *RADIOLOGY REPORT*  Clinical Data: Left-sided chest pain.  Smoker.  CHEST - 2 VIEW  Comparison: 11/27/2010.  Findings: Bilateral lower lobe pulmonary opacities consistent with pneumonia.  This is greater on the left.  Normal heart size.  No effusion or pneumothorax.  Previous cervical fusion. Mild degenerative change thoracic spine.  Otherwise negative osseous structures.  Worsening aeration from priors.  IMPRESSION: Bilateral lower lobe infiltrates, left greater than right.  Recommend follow-up radiographs until clear.   Original Report Authenticated By: Rolla Flatten, M.D.   Ct Angio Chest Pe W/cm &/or Wo Cm  08/03/2012   *RADIOLOGY REPORT*  Clinical Data: Chest pain, weakness, hypertension, diabetes, question pulmonary embolism  CT ANGIOGRAPHY CHEST  Technique:  Multidetector CT imaging of the chest using the standard protocol during bolus administration of intravenous contrast. Multiplanar reconstructed images including MIPs were obtained and reviewed to evaluate the vascular anatomy.  Contrast: 124m OMNIPAQUE IOHEXOL 350 MG/ML SOLN  Comparison: 11/18/2010  Findings: Aorta normal caliber without gross aneurysm or dissection. Mild retrosternal soft tissue density, increased from previous exam, nonspecific. Scattered normal-sized mediastinal lymph nodes with a single enlarged right cardiophrenic angle lymph node 17 x 11 mm. Spleen appears enlarged. Cannot exclude hepatic enlargement as well. Degradation of image quality secondary to body habitus.  Respiratory motion artifacts at lower lungs. Pulmonary arteries grossly patent. No evidence of pulmonary embolism. Coronary arterial calcifications  noted. Dependent atelectasis at both lung bases. No acute infiltrate, pleural effusion, or pneumothorax. No acute osseous findings.  IMPRESSION: No evidence of pulmonary embolism. Bibasilar atelectasis. Abnormal density within anterior mediastinum, somewhat poorly defined, does not appear to represent a well-defined mass. Differential diagnosis would include thymic hyperplasia, hemorrhage in the setting of acute trauma, tumor and infection considered less likely. Single nonspecific minimally enlarged right cardiophrenic angle lymph node. Premature coronary arterial calcifications and question hepatosplenomegaly.   Original Report Authenticated By: MLavonia Dana M.D.   No diagnosis found.     Date: 08/03/2012  Rate: 125 Rhythm: sinus tachycardia  QRS Axis: normal  Intervals: normal  ST/T Wave abnormalities: normal  Conduction Disutrbances: none  Narrative Interpretation: unremarkable    MDM  CT angio chest reveals a abnormal density in anterior mediastinum,  poorly defined.  Patient is very tender in anterior chest wall. Admit to general medicine        I personally performed the services described in this documentation, which was scribed in my presence. The recorded information has been reviewed and is accurate.    BNat Christen MD 08/03/12 2237

## 2012-08-03 NOTE — H&P (Signed)
[facility] History and Physical  Jamie Bell  POE:[id number]  DOB: [birth year]   DOA: 08/03/2012   PCP:   Jamie Fill, MD   Chief Complaint:  Upper chest pain for 4 days  HPI: Jamie Bell is a 42 y.o. female.   Young lady with history diabetes as well as of opioid dependence and abuse presents to the emergency room complaining of chest pain for 4 days after moving a couch with her husband. She was noted to have exquisite tenderness of the upper chest and a CT angiogram of the chest, revealed an abnormality of the anterior mediastinum up unclear character.   She denies direct trauma to the chest. Denies fever but does report chills; The pain is aggravated by movement and breathing and radiates up into her left shoulder; She also reports tenderness in the upper posterior chest but not as much as a front  She also reports an allergy to NSAIDs, but after review of her home medications she has been taking ibuprofen, and reports she takes it with Benadryl. The addition of NSAIDS to her allergy record is only 1 months old. She has previously been refused narcotics at her primary physician's office.   Rewiew of Systems:   All systems negative except as marked bold or noted in the HPI;  Constitutional:    malaise, fever and chills. ;  Eyes:   eye pain, redness and discharge. ;  ENMT:   ear pain, hoarseness, nasal congestion, sinus pressure and sore throat. ;  Cardiovascular:    chest pain, palpitations, diaphoresis, dyspnea and peripheral edema.  Respiratory:   cough, hemoptysis, wheezing and stridor. ;  Gastrointestinal:  nausea, vomiting, diarrhea, constipation, abdominal pain, melena, blood in stool, hematemesis, jaundice and rectal bleeding. unusual weight loss..   Genitourinary:    frequency, dysuria, incontinence,flank pain and hematuria; Musculoskeletal:   back pain and neck pain.  swelling and trauma.;  Skin: .  pruritus, rash, abrasions, bruising and skin  lesion.; ulcerations Neuro:    headache, lightheadedness and neck stiffness.  weakness, altered level of consciousness, altered mental status, extremity weakness, burning feet, involuntary movement, seizure and syncope.  Psych:    anxiety, depression, insomnia, tearfulness, panic attacks, hallucinations, paranoia, suicidal or homicidal ideation    Past Medical History  Diagnosis Date  . Diabetes mellitus   . Chronic back pain   . Hypertension   . PCOS (polycystic ovarian syndrome)   . Depression   . DDD (degenerative disc disease)   . Sciatica   . Decreased hearing on the right.    F/u by ENT in the past and no further management.    Past Surgical History  Procedure Laterality Date  . Back surgery    . Cholecystectomy    . Neck surgery    . Carpal tunnel release    . Breast surgery      Medications:  HOME MEDS: Prior to Admission medications   Medication Sig Start Date End Date Taking? Authorizing Provider  ibuprofen (ADVIL,MOTRIN) 200 MG tablet Take 600 mg by mouth every 6 (six) hours as needed for pain.   Yes Historical Provider, MD     Allergies:  Allergies  Allergen Reactions  . Ciprofloxacin Swelling  . Nsaids Hives    Social History:   reports that she has been smoking Cigarettes.  She has a 30 pack-year smoking history. She has never used smokeless tobacco. She reports that she uses illicit drugs (IV and Marijuana). She reports that she does not  drink alcohol.  Family History: No family history on file.   Physical Exam: Filed Vitals:   08/03/12 2100 08/03/12 2145 08/03/12 2200 08/03/12 2215  BP: 123/78  119/71   Pulse: 97 91 84 86  Temp:      TempSrc:      Resp: 19 19 22 21   Height:      Weight:      SpO2: 96% 98% 97% 97%   Blood pressure 119/71, pulse 86, temperature 98.6 F (37 C), temperature source Oral, resp. rate 21, height 5' 5"  (1.651 m), weight 88.451 kg (195 lb), last menstrual period 07/27/2012, SpO2 97.00%. Body mass index is 32.45  kg/(m^2).   GEN:  Pleasant obese middle-aged lady lying bed in painful distress; cooperative with exam PSYCH:  alert and oriented x4; affect is appropriate. HEENT: Mucous membranes pink and anicteric; PERRLA; EOM intact;  Breasts:: Not examined CHEST WALL: Of the upper chest wall to the neck appears to be somewhat flushed, but blanches easily; it is exquisitely tender; CHEST: Normal respiration, clear to auscultation bilaterally HEART: Regular rate and rhythm; no murmurs rubs or gallops BACK: No kyphosis no scoliosis; no CVA tenderness ABDOMEN: Obese, soft non-tender; no masses, no organomegaly, normal abdominal bowel sounds;no intertriginous candida. Rectal Exam: Not done EXTREMITIES: No bone or joint deformity;  no edema; no ulcerations. Genitalia: not examined PULSES: 2+ and symmetric SKIN: Scarring under both axilla from her current boils; ? Hiradenitis. Healing ruptured boil in the left groin CNS: Cranial nerves 2-12 grossly intact no focal lateralizing neurologic deficit   Labs on Admission:  Basic Metabolic Panel:  Recent Labs Lab 08/03/12 1724  NA 130*  K 2.7*  CL 94*  CO2 26  GLUCOSE 272*  BUN 7  CREATININE 0.55  CALCIUM 8.8   Liver Function Tests: No results found for this basename: AST, ALT, ALKPHOS, BILITOT, PROT, ALBUMIN,  in the last 168 hours No results found for this basename: LIPASE, AMYLASE,  in the last 168 hours No results found for this basename: AMMONIA,  in the last 168 hours CBC:  Recent Labs Lab 08/03/12 1724  WBC 9.6  NEUTROABS 6.7  HGB 13.6  HCT 37.8  MCV 88.3  PLT 129*   Cardiac Enzymes:  Recent Labs Lab 08/03/12 1724  TROPONINI <0.30   BNP: No components found with this basename: POCBNP,  D-dimer: No components found with this basename: D-DIMER,  CBG: No results found for this basename: GLUCAP,  in the last 168 hours  Radiological Exams on Admission: Dg Chest 2 View  08/03/2012   *RADIOLOGY REPORT*  Clinical Data:  Left-sided chest pain.  Smoker.  CHEST - 2 VIEW  Comparison: 11/27/2010.  Findings: Bilateral lower lobe pulmonary opacities consistent with pneumonia.  This is greater on the left.  Normal heart size.  No effusion or pneumothorax.  Previous cervical fusion. Mild degenerative change thoracic spine.  Otherwise negative osseous structures.  Worsening aeration from priors.  IMPRESSION: Bilateral lower lobe infiltrates, left greater than right.  Recommend follow-up radiographs until clear.   Original Report Authenticated By: Rolla Flatten, M.D.   Ct Angio Chest Pe W/cm &/or Wo Cm  08/03/2012   *RADIOLOGY REPORT*  Clinical Data: Chest pain, weakness, hypertension, diabetes, question pulmonary embolism  CT ANGIOGRAPHY CHEST  Technique:  Multidetector CT imaging of the chest using the standard protocol during bolus administration of intravenous contrast. Multiplanar reconstructed images including MIPs were obtained and reviewed to evaluate the vascular anatomy.  Contrast: 197m OMNIPAQUE IOHEXOL 350 MG/ML SOLN  Comparison: 11/18/2010  Findings: Aorta normal caliber without gross aneurysm or dissection. Mild retrosternal soft tissue density, increased from previous exam, nonspecific. Scattered normal-sized mediastinal lymph nodes with a single enlarged right cardiophrenic angle lymph node 17 x 11 mm. Spleen appears enlarged. Cannot exclude hepatic enlargement as well. Degradation of image quality secondary to body habitus.  Respiratory motion artifacts at lower lungs. Pulmonary arteries grossly patent. No evidence of pulmonary embolism. Coronary arterial calcifications noted. Dependent atelectasis at both lung bases. No acute infiltrate, pleural effusion, or pneumothorax. No acute osseous findings.  IMPRESSION: No evidence of pulmonary embolism. Bibasilar atelectasis. Abnormal density within anterior mediastinum, somewhat poorly defined, does not appear to represent a well-defined mass. Differential diagnosis would include  thymic hyperplasia, hemorrhage in the setting of acute trauma, tumor and infection considered less likely. Single nonspecific minimally enlarged right cardiophrenic angle lymph node. Premature coronary arterial calcifications and question hepatosplenomegaly.   Original Report Authenticated By: Lavonia Dana, M.D.    Assessment/Plan Active Problems:  Chest wall pain Unidentified chest mass  Will empirically give him states for pain and inflammation; he given with Benadryl because of history of allergy; will avoid giving narcotics since they have no anti-inflammatory benefit and she does have a history of opioid abuse requiring recurrent admissions for detox  Will empirically give antibiotics in case this has an infectious source  Will avoid heparin or Lovenox in case there is a hemorrhagic component  She may benefit from a dedicated CT scan to look at the soft tissues in the mediastinum after 24 hours     DM, UNCOMPLICATED, TYPE II   A sliding scale insulin; check hemoglobin A1c and make adjustments to her regimen accordingly     POLYCYSTIC OVARIAN DISEASE   TOBACCO USER  Nicotine cessation counseling and nicotine patch for replacement while in hospital   HYPERTENSION, BENIGN  No home antihypertensives listed; continue to monitor in hospital   BACK PAIN, CHRONIC       H/O opioid abuse  Avoid opioids if possible   Other plans as per orders.  Code Status: Full code Family Communication: plans discuss with patient   Annasofia Pohl Nocturnist [facility] Pager [phone]   08/03/2012, 11:45 PM

## 2012-08-03 NOTE — ED Notes (Signed)
Pt reports SUnday night was having chest pain.  Pt says thought had pulled a muscle because she had been moving furniture that day.  Pt says pain gets much worse.  Also noticed a knot on chest since yesterday morning.  Pt says pain is much worse with movement and palpation.  Reports chest wall is very tender.

## 2012-08-03 NOTE — ED Notes (Signed)
Critical potassium 2.7 received from lab, read back to Dr. Lacinda Axon.

## 2012-08-04 ENCOUNTER — Encounter (HOSPITAL_COMMUNITY): Payer: Self-pay | Admitting: Internal Medicine

## 2012-08-04 ENCOUNTER — Inpatient Hospital Stay (HOSPITAL_COMMUNITY): Payer: Medicare Other

## 2012-08-04 DIAGNOSIS — R0789 Other chest pain: Secondary | ICD-10-CM | POA: Diagnosis present

## 2012-08-04 DIAGNOSIS — F1111 Opioid abuse, in remission: Secondary | ICD-10-CM

## 2012-08-04 DIAGNOSIS — E282 Polycystic ovarian syndrome: Secondary | ICD-10-CM

## 2012-08-04 LAB — GLUCOSE, CAPILLARY: Glucose-Capillary: 242 mg/dL — ABNORMAL HIGH (ref 70–99)

## 2012-08-04 LAB — RAPID URINE DRUG SCREEN, HOSP PERFORMED
Amphetamines: NOT DETECTED
Benzodiazepines: NOT DETECTED
Cocaine: NOT DETECTED
Opiates: NOT DETECTED
Tetrahydrocannabinol: NOT DETECTED

## 2012-08-04 LAB — COMPREHENSIVE METABOLIC PANEL
ALT: 357 U/L — ABNORMAL HIGH (ref 0–35)
AST: 193 U/L — ABNORMAL HIGH (ref 0–37)
Albumin: 2.5 g/dL — ABNORMAL LOW (ref 3.5–5.2)
Alkaline Phosphatase: 285 U/L — ABNORMAL HIGH (ref 39–117)
Chloride: 101 mEq/L (ref 96–112)
Potassium: 3 mEq/L — ABNORMAL LOW (ref 3.5–5.1)
Sodium: 134 mEq/L — ABNORMAL LOW (ref 135–145)
Total Protein: 7.1 g/dL (ref 6.0–8.3)

## 2012-08-04 LAB — HEMOGLOBIN A1C
Hgb A1c MFr Bld: 8.3 % — ABNORMAL HIGH (ref <5.7)
Mean Plasma Glucose: 192 mg/dL — ABNORMAL HIGH (ref <117)

## 2012-08-04 LAB — CBC
MCHC: 34.8 g/dL (ref 30.0–36.0)
RDW: 13.8 % (ref 11.5–15.5)
WBC: 7.9 10*3/uL (ref 4.0–10.5)

## 2012-08-04 LAB — MAGNESIUM: Magnesium: 1.4 mg/dL — ABNORMAL LOW (ref 1.5–2.5)

## 2012-08-04 MED ORDER — KETOROLAC TROMETHAMINE 60 MG/2ML IM SOLN
60.0000 mg | Freq: Once | INTRAMUSCULAR | Status: AC
  Administered 2012-08-04: 60 mg via INTRAMUSCULAR
  Filled 2012-08-04: qty 2

## 2012-08-04 MED ORDER — INSULIN GLARGINE 100 UNIT/ML ~~LOC~~ SOLN
10.0000 [IU] | Freq: Every day | SUBCUTANEOUS | Status: DC
  Administered 2012-08-04 – 2012-08-09 (×6): 10 [IU] via SUBCUTANEOUS
  Filled 2012-08-04 (×7): qty 0.1

## 2012-08-04 MED ORDER — INSULIN ASPART 100 UNIT/ML ~~LOC~~ SOLN
0.0000 [IU] | Freq: Every day | SUBCUTANEOUS | Status: DC
  Administered 2012-08-04 – 2012-08-05 (×2): 2 [IU] via SUBCUTANEOUS

## 2012-08-04 MED ORDER — KETOROLAC TROMETHAMINE 10 MG PO TABS
10.0000 mg | ORAL_TABLET | Freq: Three times a day (TID) | ORAL | Status: DC | PRN
  Administered 2012-08-04 – 2012-08-05 (×2): 10 mg via ORAL
  Filled 2012-08-04 (×2): qty 1

## 2012-08-04 MED ORDER — SODIUM CHLORIDE 0.9 % IV SOLN
INTRAVENOUS | Status: DC
  Administered 2012-08-04 (×2): via INTRAVENOUS

## 2012-08-04 MED ORDER — INSULIN GLARGINE 100 UNIT/ML ~~LOC~~ SOLN
SUBCUTANEOUS | Status: AC
  Filled 2012-08-04: qty 10

## 2012-08-04 MED ORDER — INSULIN ASPART 100 UNIT/ML ~~LOC~~ SOLN
0.0000 [IU] | Freq: Three times a day (TID) | SUBCUTANEOUS | Status: DC
  Administered 2012-08-04: 5 [IU] via SUBCUTANEOUS
  Administered 2012-08-04: 2 [IU] via SUBCUTANEOUS
  Administered 2012-08-04: 3 [IU] via SUBCUTANEOUS
  Administered 2012-08-05: 1 [IU] via SUBCUTANEOUS
  Administered 2012-08-05 (×2): 3 [IU] via SUBCUTANEOUS
  Administered 2012-08-06 – 2012-08-07 (×2): 1 [IU] via SUBCUTANEOUS
  Administered 2012-08-07: 2 [IU] via SUBCUTANEOUS
  Administered 2012-08-08 – 2012-08-10 (×3): 1 [IU] via SUBCUTANEOUS

## 2012-08-04 MED ORDER — DIPHENHYDRAMINE HCL 25 MG PO CAPS
50.0000 mg | ORAL_CAPSULE | Freq: Four times a day (QID) | ORAL | Status: DC | PRN
  Administered 2012-08-04 – 2012-08-05 (×2): 50 mg via ORAL
  Filled 2012-08-04 (×2): qty 2

## 2012-08-04 MED ORDER — POTASSIUM CHLORIDE 10 MEQ/100ML IV SOLN
10.0000 meq | INTRAVENOUS | Status: AC
  Administered 2012-08-04 (×4): 10 meq via INTRAVENOUS
  Filled 2012-08-04 (×3): qty 100

## 2012-08-04 MED ORDER — IBUPROFEN 800 MG PO TABS: 800.0000 mg | ORAL_TABLET | Freq: Three times a day (TID) | ORAL | Status: DC

## 2012-08-04 MED ORDER — DIPHENHYDRAMINE HCL 50 MG/ML IJ SOLN
25.0000 mg | Freq: Once | INTRAMUSCULAR | Status: AC
  Administered 2012-08-04: 25 mg via INTRAVENOUS
  Filled 2012-08-04: qty 1

## 2012-08-04 MED ORDER — DEXTROSE 5 % IV SOLN
1.0000 g | INTRAVENOUS | Status: DC
  Administered 2012-08-04 – 2012-08-05 (×2): 1 g via INTRAVENOUS
  Filled 2012-08-04 (×4): qty 10

## 2012-08-04 MED ORDER — NICOTINE 21 MG/24HR TD PT24
21.0000 mg | MEDICATED_PATCH | Freq: Every day | TRANSDERMAL | Status: DC
  Administered 2012-08-04 – 2012-08-10 (×7): 21 mg via TRANSDERMAL
  Filled 2012-08-04 (×7): qty 1

## 2012-08-04 MED ORDER — POLYETHYLENE GLYCOL 3350 17 G PO PACK: 17.0000 g | PACK | Freq: Every day | ORAL | Status: DC | PRN

## 2012-08-04 MED ORDER — HYDROMORPHONE HCL PF 1 MG/ML IJ SOLN: 1.0000 mg | INTRAMUSCULAR | Status: DC | PRN

## 2012-08-04 MED ORDER — TRAZODONE HCL 50 MG PO TABS
50.0000 mg | ORAL_TABLET | Freq: Every evening | ORAL | Status: DC | PRN
  Administered 2012-08-04 – 2012-08-08 (×6): 50 mg via ORAL
  Filled 2012-08-04 (×7): qty 1

## 2012-08-04 MED ORDER — POTASSIUM CHLORIDE CRYS ER 20 MEQ PO TBCR
40.0000 meq | EXTENDED_RELEASE_TABLET | ORAL | Status: AC
  Administered 2012-08-04 (×2): 40 meq via ORAL
  Filled 2012-08-04 (×2): qty 2

## 2012-08-04 MED ORDER — ONDANSETRON HCL 4 MG/2ML IJ SOLN
4.0000 mg | Freq: Three times a day (TID) | INTRAMUSCULAR | Status: DC | PRN
Start: 2012-08-04 — End: 2012-08-04

## 2012-08-04 MED ORDER — ACETAMINOPHEN 325 MG PO TABS
650.0000 mg | ORAL_TABLET | ORAL | Status: DC | PRN
  Administered 2012-08-04 – 2012-08-09 (×11): 650 mg via ORAL
  Filled 2012-08-04 (×11): qty 2

## 2012-08-04 MED ORDER — FLEET ENEMA 7-19 GM/118ML RE ENEM: 1.0000 | ENEMA | Freq: Once | RECTAL | Status: AC | PRN

## 2012-08-04 MED ORDER — ONDANSETRON HCL 4 MG/2ML IJ SOLN: 4.0000 mg | INTRAMUSCULAR | Status: DC | PRN

## 2012-08-04 NOTE — Progress Notes (Signed)
UR chart review completed.  

## 2012-08-04 NOTE — Progress Notes (Signed)
Jamie Bell EQA:[id number] DOB: 1970-04-19 DOA: 08/03/2012 PCP: Gwendolyn Fill, MD   Subjective: This lady came yesterday with left upper chest pain which I think is musculoskeletal in nature. However blood work from this morning shows abnormal elevated liver enzymes. She denies fever or any significant abdominal pain. She is focusing on left upper chest pain. She denies any dyspnea. She says that she did have marijuana within the last week.           Physical Exam: Blood pressure 125/72, pulse 83, temperature 98.4 F (36.9 C), temperature source Oral, resp. rate 18, height 5' 5"  (1.651 m), weight 89.5 kg (197 lb 5 oz), last menstrual period 07/27/2012, SpO2 96.00%. She looks systemically well, appears to be in some pain. She is tender in the left anterior chest wall, reproducing her pain. Lung fields are clear. There is no pleural. Heart sounds are present without pericardial rub. Abdomen is soft and she does appear to be tender in the right upper quadrant, appears to have a positive Murphy's sign.   Investigations:  Recent Results (from the past 240 hour(s))  CULTURE, BLOOD (ROUTINE X 2)     Status: None   Collection Time    08/04/12  2:49 AM      Result Value Range Status   Specimen Description BLOOD RIGHT HAND   Final   Special Requests     Final   Value: BOTTLES DRAWN AEROBIC AND ANAEROBIC AEB 6CC ANA 4CC   Culture PENDING   Incomplete   Report Status PENDING   Incomplete  CULTURE, BLOOD (ROUTINE X 2)     Status: None   Collection Time    08/04/12  2:49 AM      Result Value Range Status   Specimen Description BLOOD LEFT HAND   Final   Special Requests BOTTLES DRAWN AEROBIC AND ANAEROBIC [facility] EACH   Final   Culture PENDING   Incomplete   Report Status PENDING   Incomplete     Basic Metabolic Panel:  Recent Labs  08/03/12 1724 08/04/12 0506  NA 130* 134*  K 2.7* 3.0*  CL 94* 101  CO2 26 25  GLUCOSE 272* 233*  BUN 7 8  CREATININE 0.55 0.55  CALCIUM  8.8 8.3*  MG  --  1.4*   Liver Function Tests:  Recent Labs  08/04/12 0506  AST 193*  ALT 357*  ALKPHOS 285*  BILITOT 2.5*  PROT 7.1  ALBUMIN 2.5*     CBC:  Recent Labs  08/03/12 1724 08/04/12 0506  WBC 9.6 7.9  NEUTROABS 6.7  --   HGB 13.6 12.4  HCT 37.8 35.6*  MCV 88.3 89.0  PLT 129* 128*    Dg Chest 2 View  08/03/2012   *RADIOLOGY REPORT*  Clinical Data: Left-sided chest pain.  Smoker.  CHEST - 2 VIEW  Comparison: 11/27/2010.  Findings: Bilateral lower lobe pulmonary opacities consistent with pneumonia.  This is greater on the left.  Normal heart size.  No effusion or pneumothorax.  Previous cervical fusion. Mild degenerative change thoracic spine.  Otherwise negative osseous structures.  Worsening aeration from priors.  IMPRESSION: Bilateral lower lobe infiltrates, left greater than right.  Recommend follow-up radiographs until clear.   Original Report Authenticated By: Rolla Flatten, M.D.   Ct Angio Chest Pe W/cm &/or Wo Cm  08/03/2012   *RADIOLOGY REPORT*  Clinical Data: Chest pain, weakness, hypertension, diabetes, question pulmonary embolism  CT ANGIOGRAPHY CHEST  Technique:  Multidetector CT imaging of the chest using  the standard protocol during bolus administration of intravenous contrast. Multiplanar reconstructed images including MIPs were obtained and reviewed to evaluate the vascular anatomy.  Contrast: 138m OMNIPAQUE IOHEXOL 350 MG/ML SOLN  Comparison: 11/18/2010  Findings: Aorta normal caliber without gross aneurysm or dissection. Mild retrosternal soft tissue density, increased from previous exam, nonspecific. Scattered normal-sized mediastinal lymph nodes with a single enlarged right cardiophrenic angle lymph node 17 x 11 mm. Spleen appears enlarged. Cannot exclude hepatic enlargement as well. Degradation of image quality secondary to body habitus.  Respiratory motion artifacts at lower lungs. Pulmonary arteries grossly patent. No evidence of pulmonary embolism.  Coronary arterial calcifications noted. Dependent atelectasis at both lung bases. No acute infiltrate, pleural effusion, or pneumothorax. No acute osseous findings.  IMPRESSION: No evidence of pulmonary embolism. Bibasilar atelectasis. Abnormal density within anterior mediastinum, somewhat poorly defined, does not appear to represent a well-defined mass. Differential diagnosis would include thymic hyperplasia, hemorrhage in the setting of acute trauma, tumor and infection considered less likely. Single nonspecific minimally enlarged right cardiophrenic angle lymph node. Premature coronary arterial calcifications and question hepatosplenomegaly.   Original Report Authenticated By: MLavonia Dana M.D.      Medications: I have reviewed the patient's current medications.  Impression: 1. Left chest musculoskeletal pain. 2. Possible anterior mediastinal mass. 3. Abnormal liver enzymes with positive Murphy's sign. 4. Polycystic ovarian syndrome. 5. Diabetes mellitus type 2. 6. Opioid Abuse. 7. Marijuana abuse. 8. Hypertension.     Plan: 1. Ultrasound of the right upper quadrant this morning. 2. Intramuscular Toradol for musculoskeletal chest pain. 3. Check urine drug screen.  Consultants:  None.   Procedures:  None.   Antibiotics:  None currently.                   Code Status: Full code.  Family Communication: Discussed plan with patient at the bedside.   Disposition Plan: Home when medically stable.  Time spent: 20 minutes.   LOS: 1 day   GDoree AlbeePager [phone] 08/04/2012, 8:41 AM

## 2012-08-04 NOTE — Progress Notes (Signed)
Inpatient Diabetes Program Recommendations  AACE/ADA: New Consensus Statement on Inpatient Glycemic Control (2013)  Target Ranges:  Prepandial:   less than 140 mg/dL      Peak postprandial:   less than 180 mg/dL (1-2 hours)      Critically ill patients:  140 - 180 mg/dL    Results for Jamie Bell, Jamie Bell (MRN [id number]) as of 08/04/2012 11:59  Ref. Range 08/04/2012 07:29 08/04/2012 11:19  Glucose-Capillary Latest Range: 70-99 mg/dL 178 (H) 233 (H)   Inpatient Diabetes Program Recommendations Correction (SSI): Please increase Novolog correction to moderate scale to improve inpatient glycemic control.  Note: Patient has a history of diabetes but does not take any medication as an outpatient for diabetes management.  Currently, patient is ordered to receive Novolog 0-9 units AC and Novolog 0-5 units HS for inpatient glycemic control.  Please increase Novolog correction to moderate scale to improve inpatient glycemic control.  Will continue to follow.  Thanks, Barnie Alderman, RN, MSN, CCRN Diabetes Coordinator Inpatient Diabetes Program [phone]

## 2012-08-05 ENCOUNTER — Inpatient Hospital Stay (HOSPITAL_COMMUNITY): Payer: Medicare Other

## 2012-08-05 LAB — GLUCOSE, CAPILLARY
Glucose-Capillary: 142 mg/dL — ABNORMAL HIGH (ref 70–99)
Glucose-Capillary: 224 mg/dL — ABNORMAL HIGH (ref 70–99)
Glucose-Capillary: 251 mg/dL — ABNORMAL HIGH (ref 70–99)

## 2012-08-05 LAB — COMPREHENSIVE METABOLIC PANEL
Alkaline Phosphatase: 251 U/L — ABNORMAL HIGH (ref 39–117)
BUN: 12 mg/dL (ref 6–23)
Creatinine, Ser: 0.62 mg/dL (ref 0.50–1.10)
GFR calc Af Amer: >90 mL/min (ref >90)
Glucose, Bld: 156 mg/dL — ABNORMAL HIGH (ref 70–99)
Potassium: 3.7 mEq/L (ref 3.5–5.1)
Total Bilirubin: 2.2 mg/dL — ABNORMAL HIGH (ref 0.3–1.2)
Total Protein: 7 g/dL (ref 6.0–8.3)

## 2012-08-05 LAB — T3, FREE: T3, Free: 2.1 pg/mL — ABNORMAL LOW (ref 2.3–4.2)

## 2012-08-05 LAB — T4, FREE: Free T4: 1.2 ng/dL (ref 0.80–1.80)

## 2012-08-05 MED ORDER — KETOROLAC TROMETHAMINE 10 MG PO TABS
10.0000 mg | ORAL_TABLET | Freq: Four times a day (QID) | ORAL | Status: DC | PRN
  Administered 2012-08-05 – 2012-08-07 (×8): 10 mg via ORAL
  Filled 2012-08-05 (×8): qty 1

## 2012-08-05 MED ORDER — IOHEXOL 300 MG/ML  SOLN
80.0000 mL | Freq: Once | INTRAMUSCULAR | Status: AC | PRN
  Administered 2012-08-05: 80 mL via INTRAVENOUS

## 2012-08-05 MED ORDER — METFORMIN HCL 500 MG PO TABS
1000.0000 mg | ORAL_TABLET | Freq: Two times a day (BID) | ORAL | Status: DC
  Administered 2012-08-05 – 2012-08-10 (×11): 1000 mg via ORAL
  Filled 2012-08-05 (×9): qty 2
  Filled 2012-08-05: qty 1
  Filled 2012-08-05: qty 2

## 2012-08-05 NOTE — Progress Notes (Signed)
Spoke with pt about diabetes.  Patient reports that she has had diabetes for a while and use to take Metformin.  She reports that she has been "unable to care for her diabetes properly over the past few years because she lost her house in 2010 and lives from here to there (with friends, in [place], wherever I can)".  Discussed A1C results (8.6%) with her and explained the importance of checking CBGs and maintaining good CBG control to prevent long-term and short-term complications.  Patient reports that her PCP is Dr. Thomes Dinning in [place] and she last saw Dr. Thomes Dinning in January this year and it is hard for her to find transportation to [place].  She has requested to find a local doctor that will take her on as a new patient and she reports that she has discussed local PCP with case management.  Discussed various things that impact blood glucose (diet, exercise, stress, and sickness) and patient had several questions related to diabetic diet.  Answered questions related to diet and reviewed carb counting.  Patient admits that she continues to drink sugary drinks despite being diabetic.  Her husband had a regular Mt Dew in the room and I asked him to look at the carbohydrates which had 73 grams of carbs.  I explained that there were more carbs in that one drink than she needed in a meal.  She was very surprised and said she did not realize regular sodas had so many carbohydrates in them.  Provided patient with handouts on Carb Counting, Meal planning, and Prevent Diabetes Problems:  Keep Your Diabetes Under Control book. Informed patient about free outpatient diabetes education class here at [facility] and provided her with days and times they were taught along with the contact information for [place], [place].  Patient was very appreciative of information discussed and states that she is going to make a change and start managing her diabetes.  Currently patient does not check blood glucose and does not have a meter at  home.  Please be sure that she get a prescription for a glucometer and testing supplies at time of discharge.  Placed prescription in front of hard copy chart at nurses station.   Patient verbalized understanding of information discussed and states that he has no further questions at this time.    Thanks, Barnie Alderman, RN, MSN, CCRN Diabetes Coordinator Inpatient Diabetes Program [phone]

## 2012-08-05 NOTE — Progress Notes (Signed)
Nutrition Education Consult: Diabetes  Attempted twice today to provide Diabetic Diet review. Pt very tearful and asks that I just leave the handouts and she will read them. Provided her with "CHO Counting and Tips for Weight Loss". Additionally, she was given the name and contact information for outpatient diabetes class and inpatient RD number in case she is feeling better and would like to discuss diet during hospital stay.  Colman Cater MS,RD,LDN,CSG Office: [phone] Pager: [phone]

## 2012-08-05 NOTE — Care Management Note (Signed)
    Page 1 of 2   08/10/2012     1:50:56 PM   CARE MANAGEMENT NOTE 08/10/2012  Patient:  OKEMA, ROLLINSON   Account Number:  [id number]  Date Initiated:  08/05/2012  Documentation initiated by:  Theophilus Kinds  Subjective/Objective Assessment:   Pt admitted from home with CP. Pt lives with her husband and will return home at discharge. Pt is independent with ADL's.     Action/Plan:   Pt has no PCP and CM will assist with obtaining one.   Anticipated DC Date:  08/10/2012   Anticipated DC Plan:  [place]  CM consult      Choice offered to / List presented to:          [facility] arranged  HH-1 RN      [place].   Status of service:  Completed, signed off Medicare Important Message given?  YES (If response is "NO", the following Medicare IM given date fields will be blank) Date Medicare IM given:  08/10/2012 Date Additional Medicare IM given:    Discharge Disposition:  [place]  Per UR Regulation:    If discussed at Long Length of Stay Meetings, dates discussed:   08/09/2012    Comments:  08/10/12 Claretha Cooper RN BSN CM [place] home with [facility] RN for IV AB x 5 weeks  08/08/12 Claretha Cooper RN BSN CM Pt to DC on IV AB. pt selected [facility]. Hospitalist states pt will be followed by Dr. Legrand Rams at [place].  08/05/12 [place], RN BSN CM

## 2012-08-05 NOTE — Progress Notes (Signed)
Jamie Bell EQA:[id number] DOB: 11-22-1970 DOA: 08/03/2012 PCP: Gwendolyn Fill, MD   Subjective: This lady continues to have left upper chest and now left shoulder pain. Ultrasound of her abdomen was fairly unremarkable except for possible hepatosplenomegaly. There was no evidence of biliary duct dilatation. She says that the Toradol does seem to help her pain. She does not have a primary care physician and she has not been taking any medications for her diabetes.           Physical Exam: Blood pressure 115/74, pulse 73, temperature 99.4 F (37.4 C), temperature source Oral, resp. rate 20, height 5' 5"  (1.651 m), weight 94.9 kg (209 lb 3.5 oz), last menstrual period 07/27/2012, SpO2 95.00%. She looks systemically well, appears to be in some pain. She is tender in the left anterior chest wall, reproducing her pain. Lung fields are clear. There is no pleural. Heart sounds are present without pericardial rub. Abdomen is soft and she does appear to be tender in the right upper quadrant, appears to have a positive Murphy's sign.   Investigations:  Recent Results (from the past 240 hour(s))  CULTURE, BLOOD (ROUTINE X 2)     Status: None   Collection Time    08/04/12  2:49 AM      Result Value Range Status   Specimen Description BLOOD RIGHT HAND   Final   Special Requests     Final   Value: BOTTLES DRAWN AEROBIC AND ANAEROBIC AEB=6CC ANA=4CC   Culture NO GROWTH <24 HRS   Final   Report Status PENDING   Incomplete  CULTURE, BLOOD (ROUTINE X 2)     Status: None   Collection Time    08/04/12  2:49 AM      Result Value Range Status   Specimen Description BLOOD LEFT HAND   Final   Special Requests BOTTLES DRAWN AEROBIC AND ANAEROBIC [facility] EACH   Final   Culture     Final   Value: GRAM POSITIVE COCCI IN CLUSTERS     Gram Stain Report Called to,Read Back By and Verified With: WALKER L AT 0430 ON [id number] BY FORSYTH K   Report Status PENDING   Incomplete     Basic Metabolic  Panel:  Recent Labs  08/03/12 1724 08/04/12 0506  NA 130* 134*  K 2.7* 3.0*  CL 94* 101  CO2 26 25  GLUCOSE 272* 233*  BUN 7 8  CREATININE 0.55 0.55  CALCIUM 8.8 8.3*  MG  --  1.4*   Liver Function Tests:  Recent Labs  08/04/12 0506  AST 193*  ALT 357*  ALKPHOS 285*  BILITOT 2.5*  PROT 7.1  ALBUMIN 2.5*     CBC:  Recent Labs  08/03/12 1724 08/04/12 0506  WBC 9.6 7.9  NEUTROABS 6.7  --   HGB 13.6 12.4  HCT 37.8 35.6*  MCV 88.3 89.0  PLT 129* 128*    Dg Chest 2 View  08/03/2012   *RADIOLOGY REPORT*  Clinical Data: Left-sided chest pain.  Smoker.  CHEST - 2 VIEW  Comparison: 11/27/2010.  Findings: Bilateral lower lobe pulmonary opacities consistent with pneumonia.  This is greater on the left.  Normal heart size.  No effusion or pneumothorax.  Previous cervical fusion. Mild degenerative change thoracic spine.  Otherwise negative osseous structures.  Worsening aeration from priors.  IMPRESSION: Bilateral lower lobe infiltrates, left greater than right.  Recommend follow-up radiographs until clear.   Original Report Authenticated By: Rolla Flatten, M.D.   Ct Angio  Chest Pe W/cm &/or Wo Cm  08/03/2012   *RADIOLOGY REPORT*  Clinical Data: Chest pain, weakness, hypertension, diabetes, question pulmonary embolism  CT ANGIOGRAPHY CHEST  Technique:  Multidetector CT imaging of the chest using the standard protocol during bolus administration of intravenous contrast. Multiplanar reconstructed images including MIPs were obtained and reviewed to evaluate the vascular anatomy.  Contrast: 179m OMNIPAQUE IOHEXOL 350 MG/ML SOLN  Comparison: 11/18/2010  Findings: Aorta normal caliber without gross aneurysm or dissection. Mild retrosternal soft tissue density, increased from previous exam, nonspecific. Scattered normal-sized mediastinal lymph nodes with a single enlarged right cardiophrenic angle lymph node 17 x 11 mm. Spleen appears enlarged. Cannot exclude hepatic enlargement as well.  Degradation of image quality secondary to body habitus.  Respiratory motion artifacts at lower lungs. Pulmonary arteries grossly patent. No evidence of pulmonary embolism. Coronary arterial calcifications noted. Dependent atelectasis at both lung bases. No acute infiltrate, pleural effusion, or pneumothorax. No acute osseous findings.  IMPRESSION: No evidence of pulmonary embolism. Bibasilar atelectasis. Abnormal density within anterior mediastinum, somewhat poorly defined, does not appear to represent a well-defined mass. Differential diagnosis would include thymic hyperplasia, hemorrhage in the setting of acute trauma, tumor and infection considered less likely. Single nonspecific minimally enlarged right cardiophrenic angle lymph node. Premature coronary arterial calcifications and question hepatosplenomegaly.   Original Report Authenticated By: MLavonia Dana M.D.   UKoreaAbdomen Complete  08/04/2012   *RADIOLOGY REPORT*  Clinical Data:  Abnormal liver enzymes.  Question positive Murphy's sign.  COMPLETE ABDOMINAL ULTRASOUND  Comparison:  CT 11/18/2010  Findings:  Gallbladder:  Prior cholecystectomy.  On  Common bile duct:   Slightly prominent, 8 mm, likely related to post cholecystectomy state.  Liver:  Possible mild hepatomegaly.  Craniocaudal length is 20 cm. Normal echotexture.  No focal abnormality.  No biliary ductal dilatation.  IVC:  Appears normal.  Pancreas:  No focal abnormality seen.  Spleen:  Although the splenic length is within normal limits (11.8 cm), the splenic volume is increased, measuring 706 ml.  No focal abnormality.  Right Kidney:   Normal in size and parenchymal echogenicity.  No evidence of mass or hydronephrosis.  Left Kidney:  Normal in size and parenchymal echogenicity.  No evidence of mass or hydronephrosis.  Abdominal aorta:  No aneurysm identified.  IMPRESSION: Suspect hepatosplenomegaly.  Prior cholecystectomy.   Original Report Authenticated By: KRolm Baptise M.D.       Medications: I have reviewed the patient's current medications.  Impression: 1. Left chest musculoskeletal pain. Left shoulder pain. 2. Possible anterior mediastinal mass. 3. Abnormal liver enzymes with positive Murphy's sign. 4. Polycystic ovarian syndrome. 5. Diabetes mellitus type 2. 6. Opioid Abuse. 7. Marijuana abuse. 8. Hypertension. 9. Blood cultures are positive for gram-positive cocci in clusters, await further identification.     Plan: 1. Repeat liver enzymes today. 2. Start metformin 1 g twice a day for diabetes mellitus. 3. CT scan of the chest with contrast to further delineate the possible anterior mediastinal mass. 4. Left shoulder chest x-ray.  Consultants:  None.   Procedures:  None.   Antibiotics:  Rocephin intravenously started 08/04/2012.                  Code Status: Full code.  Family Communication: Discussed plan with patient at the bedside.   Disposition Plan: Home when medically stable.  Time spent: 20 minutes.   LOS: 2 days   GDoree AlbeePager [phone] 08/05/2012, 7:47 AM

## 2012-08-06 ENCOUNTER — Telehealth: Payer: Self-pay | Admitting: Thoracic Surgery (Cardiothoracic Vascular Surgery)

## 2012-08-06 LAB — CBC
Hemoglobin: 12.2 g/dL (ref 12.0–15.0)
MCH: 30.8 pg (ref 26.0–34.0)
MCHC: 33.9 g/dL (ref 30.0–36.0)
MCV: 90.9 fL (ref 78.0–100.0)
RBC: 3.96 MIL/uL (ref 3.87–5.11)

## 2012-08-06 LAB — COMPREHENSIVE METABOLIC PANEL
CO2: 22 mEq/L (ref 19–32)
Calcium: 8.3 mg/dL — ABNORMAL LOW (ref 8.4–10.5)
Creatinine, Ser: 0.67 mg/dL (ref 0.50–1.10)
GFR calc Af Amer: >90 mL/min (ref >90)
GFR calc non Af Amer: >90 mL/min (ref >90)
Glucose, Bld: 139 mg/dL — ABNORMAL HIGH (ref 70–99)

## 2012-08-06 LAB — GLUCOSE, CAPILLARY: Glucose-Capillary: 115 mg/dL — ABNORMAL HIGH (ref 70–99)

## 2012-08-06 MED ORDER — METFORMIN HCL 1000 MG PO TABS: 1000.0000 mg | ORAL_TABLET | Freq: Two times a day (BID) | ORAL | Status: DC

## 2012-08-06 MED ORDER — PANTOPRAZOLE SODIUM 40 MG PO TBEC
40.0000 mg | DELAYED_RELEASE_TABLET | Freq: Every day | ORAL | Status: DC
  Administered 2012-08-06 – 2012-08-10 (×5): 40 mg via ORAL
  Filled 2012-08-06 (×5): qty 1

## 2012-08-06 MED ORDER — KETOROLAC TROMETHAMINE 10 MG PO TABS: 10.0000 mg | ORAL_TABLET | Freq: Four times a day (QID) | ORAL | Status: DC | PRN

## 2012-08-06 MED ORDER — CHLORHEXIDINE GLUCONATE CLOTH 2 % EX PADS: 6.0000 | MEDICATED_PAD | Freq: Every day | CUTANEOUS | Status: DC

## 2012-08-06 MED ORDER — VANCOMYCIN HCL IN DEXTROSE 1-5 GM/200ML-% IV SOLN
1000.0000 mg | Freq: Three times a day (TID) | INTRAVENOUS | Status: DC
  Administered 2012-08-06 – 2012-08-07 (×4): 1000 mg via INTRAVENOUS
  Filled 2012-08-06 (×7): qty 200

## 2012-08-06 MED ORDER — MUPIROCIN 2 % EX OINT
1.0000 "application " | TOPICAL_OINTMENT | Freq: Two times a day (BID) | CUTANEOUS | Status: DC
  Administered 2012-08-06: 1 via NASAL
  Filled 2012-08-06: qty 22

## 2012-08-06 NOTE — Progress Notes (Signed)
CRITICAL VALUE ALERT  Critical value received:  Blood Cultures GRAM POSITIVE COCCI IN CLUSTERS  Date of notification:  08/06/12   Time of notification:  2327  Critical value read back: Yes  Nurse who received alert:  Candiss Norse RN  MD notified (1st page):  Dr. Megan Salon  Time of first page:  2332  MD notified (2nd page):  Time of second page:  Responding MD:    Time MD responded:

## 2012-08-06 NOTE — Progress Notes (Signed)
ANTIBIOTIC CONSULT NOTE - INITIAL  Pharmacy Consult for Vancomycin Indication: Bacteremia with gram positve cocci  Allergies  Allergen Reactions  . Ciprofloxacin Swelling  . Nsaids Hives    Patient Measurements: Height: 5' 5"  (165.1 cm) Weight: 216 lb 12.8 oz (98.34 kg) IBW/kg (Calculated) : 57   Vital Signs: Temp: 98.4 F (36.9 C) (07/12 1029) Temp src: Oral (07/12 1029) BP: 161/95 mmHg (07/12 1029) Pulse Rate: 68 (07/12 1029) Intake/Output from previous day: 07/11 0701 - 07/12 0700 In: 1680 [P.O.:480; I.V.:1200] Out: -  Intake/Output from this shift: Total I/O In: 200 [P.O.:200] Out: -   Labs:  Recent Labs  08/03/12 1724 08/04/12 0506 08/05/12 0733 08/06/12 0640  WBC 9.6 7.9  --  5.3  HGB 13.6 12.4  --  12.2  PLT 129* 128*  --  109*  CREATININE 0.55 0.55 0.62 0.67   Estimated Creatinine Clearance: 107.4 ml/min (by C-G formula based on Cr of 0.67). No results found for this basename: VANCOTROUGH, VANCOPEAK, VANCORANDOM, [place], GENTPEAK, GENTRANDOM, TOBRATROUGH, TOBRAPEAK, TOBRARND, AMIKACINPEAK, AMIKACINTROU, AMIKACIN,  in the last 72 hours   Microbiology: Recent Results (from the past 720 hour(s))  CULTURE, BLOOD (ROUTINE X 2)     Status: None   Collection Time    08/04/12  2:49 AM      Result Value Range Status   Specimen Description BLOOD RIGHT HAND   Final   Special Requests     Final   Value: BOTTLES DRAWN AEROBIC AND ANAEROBIC AEB=6CC ANA=4CC   Culture NO GROWTH 2 DAYS   Final   Report Status PENDING   Incomplete  CULTURE, BLOOD (ROUTINE X 2)     Status: None   Collection Time    08/04/12  2:49 AM      Result Value Range Status   Specimen Description BLOOD LEFT HAND   Final   Special Requests BOTTLES DRAWN AEROBIC AND ANAEROBIC [place]   Final   Culture  Setup Time 08/05/2012 04:00   Final   Culture     Final   Value: GRAM POSITIVE COCCI IN CLUSTERS     Note: Gram Stain Report Called to,Read Back By and Verified With: WALKER L @ 0430  08/05/12 BY FORSYTH K Performed at [facility]   Report Status PENDING   Incomplete    Medical History: Past Medical History  Diagnosis Date  . Diabetes mellitus   . Chronic back pain   . Hypertension   . PCOS (polycystic ovarian syndrome)   . Depression   . DDD (degenerative disc disease)   . Sciatica   . Decreased hearing on the right.    F/u by ENT in the past and no further management.    Medications:  Scheduled:  . insulin aspart  0-5 Units Subcutaneous QHS  . insulin aspart  0-9 Units Subcutaneous TID WC  . insulin glargine  10 Units Subcutaneous QHS  . metFORMIN  1,000 mg Oral BID WC  . nicotine  21 mg Transdermal Daily  . vancomycin  1,000 mg Intravenous Q8H   Assessment: CrCl > 100 ml/min Patient weight 98.3 kg  Goal of Therapy:  Vancomycin trough level 15-20 mcg/ml  Plan:  Vancomycin 1 GM IV every 8 hours Vancomycin trough tomorrow Monitor renal function Labs per protocol  Abner Greenspan, Estreya Clay Bennett 08/06/2012,10:47 AM

## 2012-08-06 NOTE — Telephone Encounter (Signed)
Received telephone call from Dr Anastasio Champion regarding this patient's presentation and CT findings.  By report the patient had lifted something heavy and developed chest pain along the upper left sternal border.  CT reviewed and most likely consistent with acute costochondritis due to musculoskeletal trauma.  I recommend repeat CT in 2-4 weeks.  Levoy Geisen H 08/06/2012 10:21 AM

## 2012-08-06 NOTE — Progress Notes (Signed)
Jamie Bell JJH:[id number] DOB: 02/10/1970 DOA: 08/03/2012 PCP: Gwendolyn Fill, MD   Subjective: This lady continues to have left upper chest and now left shoulder pain. Ultrasound of her abdomen was fairly unremarkable except for possible hepatosplenomegaly. There was no evidence of biliary duct dilatation. She says that the Toradol does seem to help her pain. She does not have a primary care physician and she has not been taking any medications for her diabetes. Today she feels slightly better but still has pain. Blood cultures are showing gram-positive cocci in clusters, still awaiting identification. Have discussed the CT chest scan findings with Dr. Roxy Manns, thoracic surgeon. He feels that the findings are consistent with acute costochondritis due to musculoskeletal trauma and that a repeat CT scan chest scan in 2-4 weeks would be appropriate.           Physical Exam: Blood pressure 125/77, pulse 76, temperature 98.7 F (37.1 C), temperature source Oral, resp. rate 16, height 5' 5"  (1.651 m), weight 98.34 kg (216 lb 12.8 oz), last menstrual period 07/27/2012, SpO2 98.00%. She looks systemically well, appears to be in some pain. She is tender in the left anterior chest wall, reproducing her pain. Lung fields are clear. There is no pleural. Heart sounds are present without pericardial rub.    Investigations:  Recent Results (from the past 240 hour(s))  CULTURE, BLOOD (ROUTINE X 2)     Status: None   Collection Time    08/04/12  2:49 AM      Result Value Range Status   Specimen Description BLOOD RIGHT HAND   Final   Special Requests     Final   Value: BOTTLES DRAWN AEROBIC AND ANAEROBIC AEB=6CC ANA=4CC   Culture NO GROWTH 2 DAYS   Final   Report Status PENDING   Incomplete  CULTURE, BLOOD (ROUTINE X 2)     Status: None   Collection Time    08/04/12  2:49 AM      Result Value Range Status   Specimen Description BLOOD LEFT HAND   Final   Special Requests BOTTLES DRAWN  AEROBIC AND ANAEROBIC [facility] EACH   Final   Culture  Setup Time 08/05/2012 04:00   Final   Culture     Final   Value: GRAM POSITIVE COCCI IN CLUSTERS     Note: Gram Stain Report Called to,Read Back By and Verified With: WALKER L @ 0430 08/05/12 BY FORSYTH K Performed at [facility]   Report Status PENDING   Incomplete     Basic Metabolic Panel:  Recent Labs  08/03/12 1724 08/04/12 0506 08/05/12 0733 08/06/12 0640  NA 130* 134* 133* 131*  K 2.7* 3.0* 3.7 3.8  CL 94* 101 103 101  CO2 26 25 23 22   GLUCOSE 272* 233* 156* 139*  BUN 7 8 12 10   CREATININE 0.55 0.55 0.62 0.67  CALCIUM 8.8 8.3* 8.6 8.3*  MG  --  1.4*  --   --    Liver Function Tests:  Recent Labs  08/05/12 0733 08/06/12 0640  AST 76* 53*  ALT 210* 136*  ALKPHOS 251* 227*  BILITOT 2.2* 1.5*  PROT 7.0 6.9  ALBUMIN 2.3* 2.1*     CBC:  Recent Labs  08/03/12 1724 08/04/12 0506 08/06/12 0640  WBC 9.6 7.9 5.3  NEUTROABS 6.7  --   --   HGB 13.6 12.4 12.2  HCT 37.8 35.6* 36.0  MCV 88.3 89.0 90.9  PLT 129* 128* 109*    Ct  Chest W Contrast  08/05/2012   *RADIOLOGY REPORT*  Clinical Data: Chest pain, possible mediastinal mass  CT CHEST WITH CONTRAST  Technique:  Multidetector CT imaging of the chest was performed following the standard protocol during bolus administration of intravenous contrast.  Contrast: 32m OMNIPAQUE IOHEXOL 300 MG/ML  SOLN  Comparison: CT chest dated 08/03/2012.  CT chest abdomen pelvis dated 11/18/2010.  Findings: Small bilateral pleural effusions with associated lower lobe opacities, likely atelectasis.  No suspicious pulmonary nodules.  No pneumothorax.  Stable 1.9 x 4.0 cm irregular soft tissue lesion/complex fluid in the anterior mediastinum (series 2/image 21).  On the sagittal view, there is associated stranding anterior to the sternomanubrial joint (sagittal image 61), which raises concern for infection.  No definite underlying osseous abnormality or fracture.  Additional  differential considerations include hemorrhage (in the setting of trauma) or possibly thymic hyperplasia.  Neoplasm such as lymphoma is considered unlikely but cannot be completely excluded.  The heart is normal in size.  No pericardial effusion.  Small mediastinal lymph nodes measuring up to 8 mm short axis (series 2/image 19).  No suspicious axillary lymphadenopathy.  Visualized upper abdomen is notable for cholecystectomy clips and small upper abdominal lymph nodes measuring up to 1.3 cm short axis (series 2/image 55), minimally increased from 2012. 13 mm short axis right cardiophrenic angle node, previously 9 mm.  A very subtle nodular hepatic contour is possible, raising the possibility of early cirrhosis.  Mild degenerative changes of the visualized thoracolumbar spine. No evidence of sternal fracture.  IMPRESSION: 1.9 x 4.0 cm irregular soft tissue lesion / complex fluid in the anterior mediastinum, unchanged from recent CT.  This appears to be associated with the sternomanubrial joint and is worrisome for infection.  Less likely, this could reflect hemorrhage (in the setting of trauma) or possibly thymic hyperplasia.  Neoplasm such as lymphoma is considered unlikely.  Small bilateral pleural effusions with associated lower lobe atelectasis.  Possible early cirrhosis.  Small upper abdominal lymph nodes, similar versus minimally increased from 2012, favored to be reactive.   Original Report Authenticated By: SJulian Hy M.D.   [place]oreaAbdomen Complete  08/04/2012   *RADIOLOGY REPORT*  Clinical Data:  Abnormal liver enzymes.  Question positive Murphy's sign.  COMPLETE ABDOMINAL ULTRASOUND  Comparison:  CT 11/18/2010  Findings:  Gallbladder:  Prior cholecystectomy.  On  Common bile duct:   Slightly prominent, 8 mm, likely related to post cholecystectomy state.  Liver:  Possible mild hepatomegaly.  Craniocaudal length is 20 cm. Normal echotexture.  No focal abnormality.  No biliary ductal dilatation.  IVC:   Appears normal.  Pancreas:  No focal abnormality seen.  Spleen:  Although the splenic length is within normal limits (11.8 cm), the splenic volume is increased, measuring 706 ml.  No focal abnormality.  Right Kidney:   Normal in size and parenchymal echogenicity.  No evidence of mass or hydronephrosis.  Left Kidney:  Normal in size and parenchymal echogenicity.  No evidence of mass or hydronephrosis.  Abdominal aorta:  No aneurysm identified.  IMPRESSION: Suspect hepatosplenomegaly.  Prior cholecystectomy.   Original Report Authenticated By: KRolm Baptise M.D.   Dg Shoulder Left  08/05/2012   *RADIOLOGY REPORT*  Clinical Data: Left shoulder pain.  LEFT SHOULDER - 2+ VIEW  Comparison: Plain films left shoulder 10/11/2010.  Findings: The humerus is located and the acromioclavicular joint is intact.  Mild to moderate acromioclavicular degenerative change is seen.  There is no fracture.  Imaged left lung and ribs  are unremarkable.  IMPRESSION: No acute finding or interval change.  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   Original Report Authenticated By: Orlean Patten, M.D.      Medications: I have reviewed the patient's current medications.  Impression: 1. Left chest musculoskeletal pain. Left shoulder pain. 2. 2 cm x 4 cm mass in the anterior mediastinum, consistent with soft tissue trauma, unlikely to reflect infection per thoracic surgery. 3. Abnormal liver enzymes with positive Murphy's sign. 4. Polycystic ovarian syndrome. 5. Diabetes mellitus type 2. 6. Opioid Abuse. 7. Marijuana abuse. 8. Hypertension. 9. Blood cultures are positive for gram-positive cocci in clusters, await further identification.     Plan: 1. Start intravenous vancomycin for gram-positive cocci in clusters. Await further identification.  Consultants:  None.   Procedures:  None.   Antibiotics:  Rocephin intravenously started 08/04/2012. Discontinue 08/06/2012.  Vancomycin  intravenously started 08/06/2012.                  Code Status: Full code.  Family Communication: Discussed plan with patient at the bedside.   Disposition Plan: Home when medically stable.  Time spent: 20 minutes.   LOS: 3 days   Doree Albee Pager [phone]  08/06/2012, 10:30 AM

## 2012-08-07 DIAGNOSIS — A4902 Methicillin resistant Staphylococcus aureus infection, unspecified site: Secondary | ICD-10-CM

## 2012-08-07 DIAGNOSIS — R7881 Bacteremia: Secondary | ICD-10-CM

## 2012-08-07 LAB — CULTURE, BLOOD (ROUTINE X 2)

## 2012-08-07 LAB — GLUCOSE, CAPILLARY
Glucose-Capillary: 129 mg/dL — ABNORMAL HIGH (ref 70–99)
Glucose-Capillary: 115 mg/dL — ABNORMAL HIGH (ref 70–99)
Glucose-Capillary: 160 mg/dL — ABNORMAL HIGH (ref 70–99)

## 2012-08-07 LAB — CBC
Platelets: 110 10*3/uL — ABNORMAL LOW (ref 150–400)
RDW: 14 % (ref 11.5–15.5)
WBC: 4.8 10*3/uL (ref 4.0–10.5)

## 2012-08-07 LAB — COMPREHENSIVE METABOLIC PANEL
AST: 46 U/L — ABNORMAL HIGH (ref 0–37)
Albumin: 2.2 g/dL — ABNORMAL LOW (ref 3.5–5.2)
Alkaline Phosphatase: 227 U/L — ABNORMAL HIGH (ref 39–117)
Chloride: 103 mEq/L (ref 96–112)
Creatinine, Ser: 0.64 mg/dL (ref 0.50–1.10)
Potassium: 3.5 mEq/L (ref 3.5–5.1)
Total Bilirubin: 1.4 mg/dL — ABNORMAL HIGH (ref 0.3–1.2)
Total Protein: 7.6 g/dL (ref 6.0–8.3)

## 2012-08-07 LAB — VANCOMYCIN, TROUGH: Vancomycin Tr: 13 ug/mL (ref 10.0–20.0)

## 2012-08-07 MED ORDER — SENNOSIDES-DOCUSATE SODIUM 8.6-50 MG PO TABS: 1.0000 | ORAL_TABLET | Freq: Every evening | ORAL | Status: DC | PRN

## 2012-08-07 MED ORDER — VANCOMYCIN HCL 10 G IV SOLR
2000.0000 mg | Freq: Two times a day (BID) | INTRAVENOUS | Status: DC
  Administered 2012-08-07 – 2012-08-10 (×6): 2000 mg via INTRAVENOUS
  Filled 2012-08-07 (×8): qty 2000

## 2012-08-07 MED ORDER — PANTOPRAZOLE SODIUM 40 MG PO TBEC: 40.0000 mg | DELAYED_RELEASE_TABLET | Freq: Every day | ORAL | Status: DC

## 2012-08-07 MED ORDER — OXYCODONE HCL 5 MG PO TABS
5.0000 mg | ORAL_TABLET | ORAL | Status: DC | PRN
  Administered 2012-08-07 – 2012-08-08 (×7): 5 mg via ORAL
  Filled 2012-08-07 (×7): qty 1

## 2012-08-07 MED ORDER — ALUM & MAG HYDROXIDE-SIMETH 200-200-20 MG/5ML PO SUSP: 30.0000 mL | ORAL | Status: DC | PRN

## 2012-08-07 NOTE — Progress Notes (Addendum)
[place] for Vancomycin Indication: Bacteremia with gram positve cocci  Allergies  Allergen Reactions  . Ciprofloxacin Swelling  . Nsaids Hives    Patient Measurements: Height: 5' 5"  (165.1 cm) Weight: 214 lb 4.8 oz (97.206 kg) IBW/kg (Calculated) : 57   Vital Signs: Temp: 98.7 F (37.1 C) (07/13 1052) Temp src: Oral (07/13 0457) BP: 131/74 mmHg (07/13 1052) Pulse Rate: 67 (07/13 1052) Intake/Output from previous day: 07/12 0701 - 07/13 0700 In: 2000 [P.O.:1400; IV Piggyback:600] Out: -  Intake/Output from this shift: Total I/O In: 240 [P.O.:240] Out: -   Labs:  Recent Labs  08/05/12 0733 08/06/12 0640 08/07/12 0607  WBC  --  5.3 4.8  HGB  --  12.2 11.6*  PLT  --  109* 110*  CREATININE 0.62 0.67 0.64   Estimated Creatinine Clearance: 106.8 ml/min (by C-G formula based on Cr of 0.64).  Recent Labs  08/07/12 1030  [place] 13.0     Microbiology: Recent Results (from the past 720 hour(s))  CULTURE, BLOOD (ROUTINE X 2)     Status: None   Collection Time    08/04/12  2:49 AM      Result Value Range Status   Specimen Description BLOOD RIGHT HAND   Final   Special Requests     Final   Value: BOTTLES DRAWN AEROBIC AND ANAEROBIC AEB=6CC ANA=4CC   Culture     Final   Value: GRAM POSITIVE COCCI IN CLUSTERS     Gram Stain Report Called to,Read Back By and Verified With: NEILSON,T @ 2325 ON 08/06/12 BY WOODIE,J     GS DONE @ [facility]   Report Status PENDING   Incomplete  CULTURE, BLOOD (ROUTINE X 2)     Status: None   Collection Time    08/04/12  2:49 AM      Result Value Range Status   Specimen Description BLOOD LEFT HAND   Final   Special Requests BOTTLES DRAWN AEROBIC AND ANAEROBIC [facility] EACH   Final   Culture  Setup Time 08/05/2012 04:00   Final   Culture     Final   Value: METHICILLIN RESISTANT STAPHYLOCOCCUS AUREUS     Note: RIFAMPIN AND GENTAMICIN SHOULD NOT BE USED AS SINGLE DRUGS FOR TREATMENT OF STAPH INFECTIONS.  This organism DOES NOT demonstrate inducible Clindamycin resistance in vitro.     Note: Gram Stain Report Called to,Read Back By and Verified With: WALKER L @ 0430 08/05/12 BY FORSYTH K Performed at [facility], READ BACK BY AND VERIFIED WITH: ERICA [facility] 08/06/12 @ 5:37PM BY RUSCA.   Report Status 08/07/2012 FINAL   Final   Organism ID, Bacteria METHICILLIN RESISTANT STAPHYLOCOCCUS AUREUS   Final    Medical History: Past Medical History  Diagnosis Date  . Diabetes mellitus   . Chronic back pain   . Hypertension   . PCOS (polycystic ovarian syndrome)   . Depression   . DDD (degenerative disc disease)   . Sciatica   . Decreased hearing on the right.    F/u by ENT in the past and no further management.    Medications:  Scheduled:  . insulin aspart  0-5 Units Subcutaneous QHS  . insulin aspart  0-9 Units Subcutaneous TID WC  . insulin glargine  10 Units Subcutaneous QHS  . metFORMIN  1,000 mg Oral BID WC  . nicotine  21 mg Transdermal Daily  . pantoprazole  40 mg Oral Daily  .  vancomycin  1,000 mg Intravenous Q8H   Assessment: Blood culture shows MRSA Possible septic embolus Vancomycin prolonged therapy, minimum 2 weeks Vancomycin trough below goal  Goal of Therapy:  Vancomycin trough level 15-20 mcg/ml  Plan:  Change Vancomycin to 2000 mg IV every 12 hours (since prolonged therapy planned) Vancomycin trough at steady state Monitor renal function Labs per protocol  Abner Greenspan, Carry Ortez Bennett 08/07/2012,12:01 PM

## 2012-08-07 NOTE — Progress Notes (Addendum)
Jamie Bell HXT:[id number] DOB: 12-12-1970 DOA: 08/03/2012 PCP: Gwendolyn Fill, MD   Subjective: This lady continues to have left upper chest and now left shoulder pain. Ultrasound of her abdomen was fairly unremarkable except for possible hepatosplenomegaly. There was no evidence of biliary duct dilatation.  Today she feels slightly better but still has pain. Blood cultures are showing MRSA. Have discussed the CT chest scan findings with Dr. Roxy Manns, thoracic surgeon. He feels that the findings are consistent with acute costochondritis due to musculoskeletal trauma and that a repeat CT scan chest scan in 2-4 weeks would be appropriate. However with the finding of MRSA I wonder whether the CT scan chest findings actually represent septic embolus, probably not large enough for incision and drainage. She says Toradol is not sufficient for her pain.           Physical Exam: Blood pressure 129/78, pulse 57, temperature 98.2 F (36.8 C), temperature source Oral, resp. rate 18, height 5' 5"  (1.651 m), weight 97.206 kg (214 lb 4.8 oz), last menstrual period 07/27/2012, SpO2 97.00%. She looks systemically well, appears to be in some pain. She is tender in the left anterior chest wall, reproducing her pain. Lung fields are clear. There is no pleural. Heart sounds are present without pericardial rub. There are no heart murmurs. There are no clinical findings to suggest endocarditis.   Investigations:  Recent Results (from the past 240 hour(s))  CULTURE, BLOOD (ROUTINE X 2)     Status: None   Collection Time    08/04/12  2:49 AM      Result Value Range Status   Specimen Description BLOOD RIGHT HAND   Final   Special Requests     Final   Value: BOTTLES DRAWN AEROBIC AND ANAEROBIC AEB=6CC ANA=4CC   Culture     Final   Value: GRAM POSITIVE COCCI IN CLUSTERS     Gram Stain Report Called to,Read Back By and Verified With: NEILSON,T @ 2325 ON 08/06/12 BY WOODIE,J     GS DONE @ [facility]   Report  Status PENDING   Incomplete  CULTURE, BLOOD (ROUTINE X 2)     Status: None   Collection Time    08/04/12  2:49 AM      Result Value Range Status   Specimen Description BLOOD LEFT HAND   Final   Special Requests BOTTLES DRAWN AEROBIC AND ANAEROBIC [facility] EACH   Final   Culture  Setup Time 08/05/2012 04:00   Final   Culture     Final   Value: METHICILLIN RESISTANT STAPHYLOCOCCUS AUREUS     Note: RIFAMPIN AND GENTAMICIN SHOULD NOT BE USED AS SINGLE DRUGS FOR TREATMENT OF STAPH INFECTIONS. This organism DOES NOT demonstrate inducible Clindamycin resistance in vitro.     Note: Gram Stain Report Called to,Read Back By and Verified With: WALKER L @ 0430 08/05/12 BY FORSYTH K Performed at [place], READ BACK BY AND VERIFIED WITH: ERICA [facility] 08/06/12 @ 5:37PM BY RUSCA.   Report Status 08/07/2012 FINAL   Final   Organism ID, Bacteria METHICILLIN RESISTANT STAPHYLOCOCCUS AUREUS   Final     Basic Metabolic Panel:  Recent Labs  08/06/12 0640 08/07/12 0607  NA 131* 134*  K 3.8 3.5  CL 101 103  CO2 22 23  GLUCOSE 139* 136*  BUN 10 9  CREATININE 0.67 0.64  CALCIUM 8.3* 8.4   Liver Function Tests:  Recent Labs  08/06/12 0640 08/07/12 0607  AST 53*  46*  ALT 136* 98*  ALKPHOS 227* 227*  BILITOT 1.5* 1.4*  PROT 6.9 7.6  ALBUMIN 2.1* 2.2*     CBC:  Recent Labs  08/06/12 0640 08/07/12 0607  WBC 5.3 4.8  HGB 12.2 11.6*  HCT 36.0 33.7*  MCV 90.9 90.8  PLT 109* 110*    Ct Chest W Contrast  08/05/2012   *RADIOLOGY REPORT*  Clinical Data: Chest pain, possible mediastinal mass  CT CHEST WITH CONTRAST  Technique:  Multidetector CT imaging of the chest was performed following the standard protocol during bolus administration of intravenous contrast.  Contrast: 52m OMNIPAQUE IOHEXOL 300 MG/ML  SOLN  Comparison: CT chest dated 08/03/2012.  CT chest abdomen pelvis dated 11/18/2010.  Findings: Small bilateral pleural effusions with associated lower lobe  opacities, likely atelectasis.  No suspicious pulmonary nodules.  No pneumothorax.  Stable 1.9 x 4.0 cm irregular soft tissue lesion/complex fluid in the anterior mediastinum (series 2/image 21).  On the sagittal view, there is associated stranding anterior to the sternomanubrial joint (sagittal image 61), which raises concern for infection.  No definite underlying osseous abnormality or fracture.  Additional differential considerations include hemorrhage (in the setting of trauma) or possibly thymic hyperplasia.  Neoplasm such as lymphoma is considered unlikely but cannot be completely excluded.  The heart is normal in size.  No pericardial effusion.  Small mediastinal lymph nodes measuring up to 8 mm short axis (series 2/image 19).  No suspicious axillary lymphadenopathy.  Visualized upper abdomen is notable for cholecystectomy clips and small upper abdominal lymph nodes measuring up to 1.3 cm short axis (series 2/image 55), minimally increased from 2012. 13 mm short axis right cardiophrenic angle node, previously 9 mm.  A very subtle nodular hepatic contour is possible, raising the possibility of early cirrhosis.  Mild degenerative changes of the visualized thoracolumbar spine. No evidence of sternal fracture.  IMPRESSION: 1.9 x 4.0 cm irregular soft tissue lesion / complex fluid in the anterior mediastinum, unchanged from recent CT.  This appears to be associated with the sternomanubrial joint and is worrisome for infection.  Less likely, this could reflect hemorrhage (in the setting of trauma) or possibly thymic hyperplasia.  Neoplasm such as lymphoma is considered unlikely.  Small bilateral pleural effusions with associated lower lobe atelectasis.  Possible early cirrhosis.  Small upper abdominal lymph nodes, similar versus minimally increased from 2012, favored to be reactive.   Original Report Authenticated By: SJulian Hy M.D.      Medications: I have reviewed the patient's current  medications.  Impression: 1. Left chest musculoskeletal pain. Left shoulder pain. 2. 2 cm x 4 cm mass in the anterior mediastinum, consistent with soft tissue trauma, unlikely to reflect infection per thoracic surgery. However MRSA bacteremia raises the possibility of an infectious embolus. 3. MRSA bacteremia. 4. Polycystic ovarian syndrome. 5. Diabetes mellitus type 2. 6. Opioid Abuse. 7. Marijuana abuse. 8. Hypertension.      Plan: 1. She will need prolonged intravenous vancomycin therapy, minimum 2 weeks. She will need PICC line placement. I have placed an order for this. 2. She will need a TEE, I have requested cardiology consultation. 3. Repeat blood cultures tomorrow. 4. Discontinue Toradol for her pain and start opioids. The combination of vancomycin and Toradol, especially in a prolonged setting, would put this patient at risk for renal failure.  Consultants:  Cardiology consultation pending.   Procedures:  None.   Antibiotics:  Rocephin intravenously started 08/04/2012. Discontinue 08/06/2012.  Vancomycin intravenously started 08/06/2012.  Code Status: Full code.  Family Communication: Discussed plan with patient at the bedside.   Disposition Plan: Home when medically stable.  Time spent: 20 minutes.   LOS: 4 days   Doree Albee Pager [phone]  08/07/2012, 9:53 AM

## 2012-08-07 NOTE — Progress Notes (Signed)
Pt with chest pain after heavy lifting, found on CT to have an anterior mediastinal mass.  Also with MRSA bacteremia.  Would consider further eval of this. Would repeat her BCx Would consider TEE She has been started on vanco       [place] [facility]icrobial Management Team Staphylococcus aureus bacteremia   Staphylococcus aureus bacteremia (SAB) is associated with a high rate of complications and mortality.  Specific aspects of clinical management are critical to optimizing the outcome of patients with SAB.  Therefore, the [facility] Antimicrobial Management Team [facility]) has initiated an intervention aimed at improving the management of SAB at [facility].  To do so, Infectious Diseases physicians are providing an evidence-based consult for the management of all patients with SAB.     Yes No Comments  Perform follow-up blood cultures (even if the patient is afebrile) to ensure clearance of bacteremia [x]  []    Remove vascular catheter and obtain follow-up blood cultures after the removal of the catheter []  []    Perform echocardiography to evaluate for endocarditis (transthoracic ECHO is 40-50% sensitive, TEE is > 90% sensitive) []  []  Please keep in mind, that neither test can definitively EXCLUDE endocarditis, and that should clinical suspicion remain high for endocarditis the patient should then still be treated with an "endocarditis" duration of therapy = 6 weeks  Consult electrophysiologist to evaluate implanted cardiac device (pacemaker, ICD) []  []    Ensure source control []  [x]  Have all abscesses been drained effectively? Have deep seeded infections (septic joints or osteomyelitis) had appropriate surgical debridement?  Investigate for "metastatic" sites of infection [x]  []  Does the patient have ANY symptom or physical exam finding that would suggest a deeper infection (back or neck pain that may be suggestive of vertebral osteomyelitis or epidural abscess, muscle pain that could be a  symptom of pyomyositis)?  Keep in mind that for deep seeded infections MRI imaging with contrast is preferred rather than other often insensitive tests such as plain x-rays, especially early in a patient's presentation.  Change antibiotic therapy to __________________ [x]  []  Beta-lactam antibiotics are preferred for MSSA due to higher cure rates.   If on Vancomycin, goal trough should be 15 - 20 mcg/mL  Estimated duration of IV antibiotic therapy:   []  []  Consult case management for probably prolonged outpatient IV antibiotic therapy

## 2012-08-08 ENCOUNTER — Inpatient Hospital Stay (HOSPITAL_COMMUNITY): Payer: Medicare Other

## 2012-08-08 ENCOUNTER — Encounter (HOSPITAL_COMMUNITY): Payer: Medicare Other

## 2012-08-08 DIAGNOSIS — R030 Elevated blood-pressure reading, without diagnosis of hypertension: Secondary | ICD-10-CM

## 2012-08-08 LAB — GLUCOSE, CAPILLARY
Glucose-Capillary: 123 mg/dL — ABNORMAL HIGH (ref 70–99)
Glucose-Capillary: 98 mg/dL (ref 70–99)
Glucose-Capillary: 114 mg/dL — ABNORMAL HIGH (ref 70–99)
Glucose-Capillary: 126 mg/dL — ABNORMAL HIGH (ref 70–99)

## 2012-08-08 MED ORDER — OXYCODONE HCL 5 MG PO TABS
5.0000 mg | ORAL_TABLET | ORAL | Status: DC | PRN
  Administered 2012-08-08 – 2012-08-10 (×11): 10 mg via ORAL
  Filled 2012-08-08 (×13): qty 2

## 2012-08-08 MED ORDER — SODIUM CHLORIDE 0.9 % IJ SOLN: 10.0000 mL | INTRAMUSCULAR | Status: DC | PRN

## 2012-08-08 MED ORDER — SODIUM CHLORIDE 0.9 % IJ SOLN
10.0000 mL | Freq: Two times a day (BID) | INTRAMUSCULAR | Status: DC
  Administered 2012-08-08 – 2012-08-09 (×2): 10 mL

## 2012-08-08 NOTE — Progress Notes (Signed)
[facility] PROGRESS NOTE  Jamie Bell QTM:[id number] DOB: [birth year] DOA: 08/03/2012 PCP: Gwendolyn Fill, MD  Assessment/Plan: 1. Left chest musculoskeletal pain. Continue pain management 2. 2 cm x 4 cm mass in the anterior mediastinum, discussed with cardiothoracic surgery felt this was likely related to costochondral pain. Recommendations were for repeat a CT of the chest in 2-4 weeks. 3. MRSA bacteremia. Source was possible skin infection. Cardiology has been consulted for possible TEE. She is on vancomycin. PICC line was placed today. 4. Polycystic ovarian syndrome 5. Type 2 diabetes. Blood sugars are under fair control  Code Status: Full code Family Communication: Discussed with patient) Disposition Plan: Discharge home once improved   Consultants:  Cardiology for TEE  Cardiothoracic surgery, Dr. Roxy Manns, telephone consult  Procedures:  None  Antibiotics:  Vancomycin  HPI/Subjective: Continues to have pain in left chest, worse with palpation and movement.  Objective: Filed Vitals:   08/07/12 2100 08/08/12 0149 08/08/12 0641 08/08/12 1120  BP: 135/77 137/65 131/65 133/90  Pulse: 53 47 64 80  Temp: 98.4 F (36.9 C) 98.1 F (36.7 C) 98.5 F (36.9 C) 98.2 F (36.8 C)  TempSrc: Oral Oral Oral   Resp: 20 18 20 20   Height:      Weight:   94.666 kg (208 lb 11.2 oz)   SpO2: 100% 100% 99% 98%    Intake/Output Summary (Last 24 hours) at 08/08/12 1402 Last data filed at 08/08/12 0500  Gross per 24 hour  Intake   1340 ml  Output      0 ml  Net   1340 ml   Filed Weights   08/06/12 0500 08/07/12 0457 08/08/12 0641  Weight: 98.34 kg (216 lb 12.8 oz) 97.206 kg (214 lb 4.8 oz) 94.666 kg (208 lb 11.2 oz)    Exam:   General:  No acute distress  Cardiovascular: S1, S2, regular rhythm  Respiratory: Clear to auscultation bilaterally  Abdomen: Soft, nontender, positive bowel sounds  Musculoskeletal: No pedal edema, tender to palpation over the left  chest   Data Reviewed: Basic Metabolic Panel:  Recent Labs Lab 08/03/12 1724 08/04/12 0506 08/05/12 0733 08/06/12 0640 08/07/12 0607  NA 130* 134* 133* 131* 134*  K 2.7* 3.0* 3.7 3.8 3.5  CL 94* 101 103 101 103  CO2 26 25 23 22 23   GLUCOSE 272* 233* 156* 139* 136*  BUN 7 8 12 10 9   CREATININE 0.55 0.55 0.62 0.67 0.64  CALCIUM 8.8 8.3* 8.6 8.3* 8.4  MG  --  1.4*  --   --   --    Liver Function Tests:  Recent Labs Lab 08/04/12 0506 08/05/12 0733 08/06/12 0640 08/07/12 0607  AST 193* 76* 53* 46*  ALT 357* 210* 136* 98*  ALKPHOS 285* 251* 227* 227*  BILITOT 2.5* 2.2* 1.5* 1.4*  PROT 7.1 7.0 6.9 7.6  ALBUMIN 2.5* 2.3* 2.1* 2.2*   No results found for this basename: LIPASE, AMYLASE,  in the last 168 hours No results found for this basename: AMMONIA,  in the last 168 hours CBC:  Recent Labs Lab 08/03/12 1724 08/04/12 0506 08/06/12 0640 08/07/12 0607  WBC 9.6 7.9 5.3 4.8  NEUTROABS 6.7  --   --   --   HGB 13.6 12.4 12.2 11.6*  HCT 37.8 35.6* 36.0 33.7*  MCV 88.3 89.0 90.9 90.8  PLT 129* 128* 109* 110*   Cardiac Enzymes:  Recent Labs Lab 08/03/12 1724  TROPONINI <0.30   BNP (last 3 results) No results found  for this basename: PROBNP,  in the last 8760 hours CBG:  Recent Labs Lab 08/07/12 1153 08/07/12 1558 08/07/12 2103 08/08/12 0800 08/08/12 1202  GLUCAP 149* 160* 136* 123* 98    Recent Results (from the past 240 hour(s))  CULTURE, BLOOD (ROUTINE X 2)     Status: None   Collection Time    08/04/12  2:49 AM      Result Value Range Status   Specimen Description BLOOD RIGHT HAND   Final   Special Requests     Final   Value: BOTTLES DRAWN AEROBIC AND ANAEROBIC AEB=6CC ANA=4CC   Culture  Setup Time 08/06/2012 22:58   Final   Culture     Final   Value: STAPHYLOCOCCUS AUREUS     Note: SUSCEPTIBILITIES PERFORMED ON PREVIOUS CULTURE WITHIN THE LAST 5 DAYS.     Note: Gram Stain Report Called to,Read Back By and Verified With: NEILSON T 08/06/12 2324  BY WOODIEJ Performed at [facility]   Report Status 08/08/2012 FINAL   Final  CULTURE, BLOOD (ROUTINE X 2)     Status: None   Collection Time    08/04/12  2:49 AM      Result Value Range Status   Specimen Description BLOOD LEFT HAND   Final   Special Requests BOTTLES DRAWN AEROBIC AND ANAEROBIC [facility] EACH   Final   Culture  Setup Time 08/05/2012 04:00   Final   Culture     Final   Value: METHICILLIN RESISTANT STAPHYLOCOCCUS AUREUS     Note: RIFAMPIN AND GENTAMICIN SHOULD NOT BE USED AS SINGLE DRUGS FOR TREATMENT OF STAPH INFECTIONS. This organism DOES NOT demonstrate inducible Clindamycin resistance in vitro.     Note: Gram Stain Report Called to,Read Back By and Verified With: WALKER L @ 0430 08/05/12 BY FORSYTH K Performed at [place], READ BACK BY AND VERIFIED WITH: ERICA [facility] 08/06/12 @ 5:37PM BY RUSCA.   Report Status 08/07/2012 FINAL   Final   Organism ID, Bacteria METHICILLIN RESISTANT STAPHYLOCOCCUS AUREUS   Final  CULTURE, BLOOD (ROUTINE X 2)     Status: None   Collection Time    08/07/12  1:48 PM      Result Value Range Status   Specimen Description BLOOD LEFT HAND   Final   Special Requests BOTTLES DRAWN AEROBIC ONLY 6CC   Final   Culture NO GROWTH 1 DAY   Final   Report Status PENDING   Incomplete  CULTURE, BLOOD (ROUTINE X 2)     Status: None   Collection Time    08/07/12  3:21 PM      Result Value Range Status   Specimen Description BLOOD RIGHT ARM   Final   Special Requests BOTTLES DRAWN AEROBIC ONLY 6CC   Final   Culture NO GROWTH 1 DAY   Final   Report Status PENDING   Incomplete  CULTURE, BLOOD (ROUTINE X 2)     Status: None   Collection Time    08/08/12  6:31 AM      Result Value Range Status   Specimen Description Peripheral   Final   Special Requests Normal   Final   Culture NO GROWTH <24 HRS   Final   Report Status PENDING   Incomplete  CULTURE, BLOOD (ROUTINE X 2)     Status: None   Collection Time     08/08/12  6:38 AM      Result Value Range Status  Specimen Description Peripheral   Final   Special Requests Normal   Final   Culture NO GROWTH <24 HRS   Final   Report Status PENDING   Incomplete     Studies: Dg Chest Port 1 View  08/08/2012   *RADIOLOGY REPORT*  Clinical Data:  PICC line placement  PORTABLE CHEST - 1 VIEW  Comparison: Portable exam 1020 hours compared to 08/03/2012  Findings: Tip of right arm PICC line enters the right jugular vein and is directed cranially above the extent of this exam. Normal heart size, mediastinal contours and pulmonary vascularity. Lungs clear. No pleural effusion or pneumothorax. Bones unremarkable.  IMPRESSION: Tip of right arm PICC line projects over the right jugular vein directed cranially, tip above extent of portable chest radiograph. Recommend repositioning. PICC line nurse aware.   Original Report Authenticated By: Lavonia Dana, M.D.   Dg Chest Port 1v Same Day  08/08/2012   *RADIOLOGY REPORT*  Clinical Data: PICC line repositioning.  PORTABLE CHEST - 1 VIEW SAME DAY  Comparison: Radiographs earlier the same date.  Findings: 1115 hours.  The right arm PICC has been redirected inferiorly.  The catheter is not convincingly demonstrated inferior to the mid SVC level, although the mediastinum is incompletely penetrated.  There is mild atelectasis in both lung bases.  Heart size and mediastinal contours are stable.  IMPRESSION: Redirected right arm PICC inferiorly into the SVC.  The tip of the line is not convincingly demonstrated.   Original Report Authenticated By: Richardean Sale, M.D.    Scheduled Meds: . insulin aspart  0-5 Units Subcutaneous QHS  . insulin aspart  0-9 Units Subcutaneous TID WC  . insulin glargine  10 Units Subcutaneous QHS  . metFORMIN  1,000 mg Oral BID WC  . nicotine  21 mg Transdermal Daily  . pantoprazole  40 mg Oral Daily  . sodium chloride  10-40 mL Intracatheter Q12H  . vancomycin  2,000 mg Intravenous Q12H    Continuous Infusions:   Active Problems:   DM, UNCOMPLICATED, TYPE II   POLYCYSTIC OVARIAN DISEASE   TOBACCO USER   HYPERTENSION, BENIGN   BACK PAIN, CHRONIC   Chest wall pain   H/O opioid abuse    Time spent: 78mns    MEMON,JEHANZEB  [facility] Pager [phone] If 7PM-7AM, please contact night-coverage at [email or web address], password [email or web address]7/14/2014, 2:02 PM  LOS: 5 days

## 2012-08-08 NOTE — Consult Note (Signed)
The patient was seen and examined, and I agree with the assessment and plan as documented above. She appears to have some musculoskeletal chest pain. Her blood cultures were positive for MRSA and is now on Vancomycin. Will plan for TEE to evaluate for potential valvular vegetations.

## 2012-08-08 NOTE — Progress Notes (Signed)
Patient has been instructed not to leave the floor.  Patient was seen smoking by 1st shift staff members.

## 2012-08-08 NOTE — Consult Note (Signed)
CARDIOLOGY CONSULT NOTE  Patient ID: Jamie Bell MRN: [id number] DOB/AGE: November 26, 1970 42 y.o.  Admit date: 08/03/2012 Referring Physician: PTH-Gosrani Primary PhysicianPILOTO, Glennon Mac, MD Primary Cardiologist: New-Koneswarin MD Reason for Consultation: Evaluation for TEE in the setting of MRSA positive bacteremia.R/O septic embolus  Active Problems:   DM, UNCOMPLICATED, TYPE II   POLYCYSTIC OVARIAN DISEASE   TOBACCO USER   HYPERTENSION, BENIGN   BACK PAIN, CHRONIC   Chest wall pain   H/O opioid abuse  HPI: Jamie Bell is a 42 y/o female patient with no prior cardiac history, who presented to the emergency room complaining of left side chest pain, after moving furniture. She has chronic pain syndrome, multiple medical problems, to include diabetes, chronic back pain, hypertension, sciatica, narcotic dependence with admissions for rehabilitation in the past, with ongoing psychiatric issues, and left breast mass s/p removal 2002..      CT scan was completed demonstrating 1.9 x 4.0 irregular soft tissue lesion complex fluid in the anterior mediastinum unchanged from prior CT. [facility] telephone call to Dr. Darylene Price, cardiothoracic surgeon, was made. CT reviewed and "most likely consistent with acute costochondritis 2 muscle skeletal trauma", he recommended repeat CT in 2-4 weeks.    The patient complained of chills and malaise. Blood cultures were drawn to rule out a septic process in the setting of chest discomfort and abnormal CT scan..Blood cultures demonstrated positive for MRSA. There is a concern for septic embolus.     Dr. Ileene Patrick her from infectious diseases  Recommended patient be placed on vancomycin. TEE was recommended. We are asked for evaluation to proceed. A PICC line has now been placed.   Patients the she has had chronic pain in her back and chest for several months.     Review of systems complete and found to be negative unless listed above   Past Medical History   Diagnosis Date  . Diabetes mellitus   . Chronic back pain   . Hypertension   . PCOS (polycystic ovarian syndrome)   . Depression   . DDD (degenerative disc disease)   . Sciatica   . Decreased hearing on the right.    F/u by ENT in the past and no further management.    No family history on file.  History   Social History  . Marital Status: Married    Spouse Name: N/A    Number of Children: N/A  . Years of Education: N/A   Occupational History  . Not on file.   Social History Main Topics  . Smoking status: Current Every Day Smoker -- 1.50 packs/day for 20 years    Types: Cigarettes  . Smokeless tobacco: Never Used     Comment: pain pills  . Alcohol Use: No  . Drug Use: Yes    Special: IV, Marijuana     Comment: prior hx of cocaine and marijuan-per patient not done in 4 years  . Sexually Active: Yes    Birth Control/ Protection: None, Abstinence   Other Topics Concern  . Not on file   Social History Narrative  . No narrative on file    Past Surgical History  Procedure Laterality Date  . Back surgery    . Cholecystectomy    . Neck surgery    . Carpal tunnel release    . Breast surgery       Prescriptions prior to admission  Medication Sig Dispense Refill  . [DISCONTINUED] ibuprofen (ADVIL,MOTRIN) 200 MG tablet Take 600 mg by mouth every  6 (six) hours as needed for pain.        Physical Exam: Blood pressure 131/65, pulse 64, temperature 98.5 F (36.9 C), temperature source Oral, resp. rate 20, height 5' 5"  (1.651 m), weight 208 lb 11.2 oz (94.666 kg), last menstrual period 07/27/2012, SpO2 99.00%.   General: Well developed, well nourished, complaining of chest discomfort. Head: Eyes PERRLA, No xanthomas.   Normal cephalic and atramatic  Lungs: Clear bilaterally with bilateral crackles in the bases. Nonproductive coughing. Heart: HRRR S1 S2, with soft systolic murmur.  Pulses are 2+ & equal.            No carotid bruit. No JVD.  No abdominal bruits.   Abdomen: Bowel sounds are positive, abdomen soft and non-tender without masses or                  Hernia's noted. Msk:  Back normal, normal gait. Normal strength and tone for age.Pain is reproducible with palpation of the sternum and left shoulder. Extremities: No clubbing, cyanosis or edema.  No splinter hemorrhages. DP +1 Neuro: Alert and oriented X 3. Psych:  Good affect, responds appropriately   Labs:   Lab Results  Component Value Date   WBC 4.8 08/07/2012   HGB 11.6* 08/07/2012   HCT 33.7* 08/07/2012   MCV 90.8 08/07/2012   PLT 110* 08/07/2012    Recent Labs Lab 08/07/12 0607  NA 134*  K 3.5  CL 103  CO2 23  BUN 9  CREATININE 0.64  CALCIUM 8.4  PROT 7.6  BILITOT 1.4*  ALKPHOS 227*  ALT 98*  AST 46*  GLUCOSE 136*   Lab Results  Component Value Date   TROPONINI <0.30 08/03/2012     Radiology: CHEST - 2 VIEW  Findings: Bilateral lower lobe pulmonary opacities consistent with pneumonia. This is greater on the left. Normal heart size. No effusion or pneumothorax. Previous cervical fusion. Mild degenerative change thoracic spine. Otherwise negative osseous structures. Worsening aeration from priors.  IMPRESSION: Bilateral lower lobe infiltrates, left greater than right.  Recommend follow-up radiographs until clear.   JFH:LKTGY tachycardia with T-wave inversion noted in the inferior leads.   ASSESSMENT AND PLAN:   1. MRSA Bacteremia: Patient has had a EGD in 06/11/2010 demonstrating normal esophagus without evidence of erythema, inflammation,  mass, or Barrett esophagus.She has not had a cardiac evaluation in the past. Plan for TEE in am at 10:30 with Dr.Rothbart. She will be will be kept NPO after midnight   2. Chest pain: Doubt cardiac etiology at this time. Troponin is negative but was found to have some T-wave abnormalities in the inferior leads during tachycarda on EKG. Risk factors include diabetes, hypertension, unknown lipid status. Will check lipids in  the am for risk stratification.   3. Hypertension: Blood pressure is currently well controlled.Recommend low dose ACE in the setting of diabetes for renal and endothelial protection.  4. Ongoing tobacco abuse: Counseling on cessation discussed with patient. Informed that this is serious risk factor for heart disease.    Signed: Phill Myron. Purcell Nails NP [facility] 08/08/2012, 9:09 AM Co-Sign MD

## 2012-08-09 ENCOUNTER — Encounter (HOSPITAL_COMMUNITY): Payer: Self-pay | Admitting: *Deleted

## 2012-08-09 ENCOUNTER — Encounter (HOSPITAL_COMMUNITY)
Admission: EM | Disposition: A | Discharge status: Home / Self Care | Payer: Self-pay | Source: Home / Self Care | Attending: Internal Medicine

## 2012-08-09 ENCOUNTER — Other Ambulatory Visit (HOSPITAL_COMMUNITY): Payer: Medicare Other

## 2012-08-09 DIAGNOSIS — R7881 Bacteremia: Secondary | ICD-10-CM

## 2012-08-09 HISTORY — PX: TEE WITHOUT CARDIOVERSION: SHX5443

## 2012-08-09 LAB — BASIC METABOLIC PANEL
CO2: 30 mEq/L (ref 19–32)
Calcium: 8.6 mg/dL (ref 8.4–10.5)
Creatinine, Ser: 0.7 mg/dL (ref 0.50–1.10)
GFR calc non Af Amer: >90 mL/min (ref >90)

## 2012-08-09 LAB — CULTURE, BLOOD (ROUTINE X 2)

## 2012-08-09 LAB — VANCOMYCIN, TROUGH: Vancomycin Tr: 16 ug/mL (ref 10.0–20.0)

## 2012-08-09 LAB — CBC
MCV: 92.3 fL (ref 78.0–100.0)
Platelets: 162 10*3/uL (ref 150–400)
RDW: 14.2 % (ref 11.5–15.5)
WBC: 5.8 10*3/uL (ref 4.0–10.5)

## 2012-08-09 LAB — GLUCOSE, CAPILLARY

## 2012-08-09 LAB — LIPID PANEL: HDL: 8 mg/dL — ABNORMAL LOW (ref >39)

## 2012-08-09 SURGERY — ECHOCARDIOGRAM, TRANSESOPHAGEAL
Anesthesia: Moderate Sedation

## 2012-08-09 MED ORDER — FENTANYL CITRATE 0.05 MG/ML IJ SOLN
INTRAMUSCULAR | Status: AC
  Filled 2012-08-09: qty 2

## 2012-08-09 MED ORDER — MIDAZOLAM HCL 5 MG/5ML IJ SOLN
INTRAMUSCULAR | Status: AC
  Filled 2012-08-09: qty 10

## 2012-08-09 MED ORDER — SODIUM CHLORIDE 0.9 % IV SOLN
INTRAVENOUS | Status: DC
  Administered 2012-08-09: 1000 mL via INTRAVENOUS

## 2012-08-09 MED ORDER — MIDAZOLAM HCL 5 MG/5ML IJ SOLN
INTRAMUSCULAR | Status: DC | PRN
  Administered 2012-08-09 (×4): 2 mg via INTRAVENOUS

## 2012-08-09 MED ORDER — BUTAMBEN-TETRACAINE-BENZOCAINE 2-2-14 % EX AERO
INHALATION_SPRAY | CUTANEOUS | Status: DC | PRN
  Administered 2012-08-09: 2 via TOPICAL

## 2012-08-09 NOTE — H&P (View-Only) (Signed)
CARDIOLOGY CONSULT NOTE  Patient ID: Jamie Bell MRN: [id number] DOB/AGE: August 22, 1970 42 y.o.  Admit date: 08/03/2012 Referring Physician: PTH-Gosrani Primary PhysicianPILOTO, Glennon Mac, MD Primary Cardiologist: New-Koneswarin MD Reason for Consultation: Evaluation for TEE in the setting of MRSA positive bacteremia.R/O septic embolus  Active Problems:   DM, UNCOMPLICATED, TYPE II   POLYCYSTIC OVARIAN DISEASE   TOBACCO USER   HYPERTENSION, BENIGN   BACK PAIN, CHRONIC   Chest wall pain   H/O opioid abuse  HPI: [name] is a 42 y/o female patient with no prior cardiac history, who presented to the emergency room complaining of left side chest pain, after moving furniture. She has chronic pain syndrome, multiple medical problems, to include diabetes, chronic back pain, hypertension, sciatica, narcotic dependence with admissions for rehabilitation in the past, with ongoing psychiatric issues, and left breast mass s/p removal 2002..      CT scan was completed demonstrating 1.9 x 4.0 irregular soft tissue lesion complex fluid in the anterior mediastinum unchanged from prior CT. [facility] telephone call to Dr. Darylene Price, cardiothoracic surgeon, was made. CT reviewed and "most likely consistent with acute costochondritis 2 muscle skeletal trauma", he recommended repeat CT in 2-4 weeks.    The patient complained of chills and malaise. Blood cultures were drawn to rule out a septic process in the setting of chest discomfort and abnormal CT scan..Blood cultures demonstrated positive for MRSA. There is a concern for septic embolus.     Dr. Ileene Patrick her from infectious diseases  Recommended patient be placed on vancomycin. TEE was recommended. We are asked for evaluation to proceed. A PICC line has now been placed.   Patients the she has had chronic pain in her back and chest for several months.     Review of systems complete and found to be negative unless listed above   Past Medical History   Diagnosis Date  . Diabetes mellitus   . Chronic back pain   . Hypertension   . PCOS (polycystic ovarian syndrome)   . Depression   . DDD (degenerative disc disease)   . Sciatica   . Decreased hearing on the right.    F/u by ENT in the past and no further management.    No family history on file.  History   Social History  . Marital Status: Married    Spouse Name: N/A    Number of Children: N/A  . Years of Education: N/A   Occupational History  . Not on file.   Social History Main Topics  . Smoking status: Current Every Day Smoker -- 1.50 packs/day for 20 years    Types: Cigarettes  . Smokeless tobacco: Never Used     Comment: pain pills  . Alcohol Use: No  . Drug Use: Yes    Special: IV, Marijuana     Comment: prior hx of cocaine and marijuan-per patient not done in 4 years  . Sexually Active: Yes    Birth Control/ Protection: None, Abstinence   Other Topics Concern  . Not on file   Social History Narrative  . No narrative on file    Past Surgical History  Procedure Laterality Date  . Back surgery    . Cholecystectomy    . Neck surgery    . Carpal tunnel release    . Breast surgery       Prescriptions prior to admission  Medication Sig Dispense Refill  . [DISCONTINUED] ibuprofen (ADVIL,MOTRIN) 200 MG tablet Take 600 mg by mouth every  6 (six) hours as needed for pain.        Physical Exam: Blood pressure 131/65, pulse 64, temperature 98.5 F (36.9 C), temperature source Oral, resp. rate 20, height 5' 5"  (1.651 m), weight 208 lb 11.2 oz (94.666 kg), last menstrual period 07/27/2012, SpO2 99.00%.   General: Well developed, well nourished, complaining of chest discomfort. Head: Eyes PERRLA, No xanthomas.   Normal cephalic and atramatic  Lungs: Clear bilaterally with bilateral crackles in the bases. Nonproductive coughing. Heart: HRRR S1 S2, with soft systolic murmur.  Pulses are 2+ & equal.            No carotid bruit. No JVD.  No abdominal bruits.   Abdomen: Bowel sounds are positive, abdomen soft and non-tender without masses or                  Hernia's noted. Msk:  Back normal, normal gait. Normal strength and tone for age.Pain is reproducible with palpation of the sternum and left shoulder. Extremities: No clubbing, cyanosis or edema.  No splinter hemorrhages. DP +1 Neuro: Alert and oriented X 3. Psych:  Good affect, responds appropriately   Labs:   Lab Results  Component Value Date   WBC 4.8 08/07/2012   HGB 11.6* 08/07/2012   HCT 33.7* 08/07/2012   MCV 90.8 08/07/2012   PLT 110* 08/07/2012    Recent Labs Lab 08/07/12 0607  NA 134*  K 3.5  CL 103  CO2 23  BUN 9  CREATININE 0.64  CALCIUM 8.4  PROT 7.6  BILITOT 1.4*  ALKPHOS 227*  ALT 98*  AST 46*  GLUCOSE 136*   Lab Results  Component Value Date   TROPONINI <0.30 08/03/2012     Radiology: CHEST - 2 VIEW  Findings: Bilateral lower lobe pulmonary opacities consistent with pneumonia. This is greater on the left. Normal heart size. No effusion or pneumothorax. Previous cervical fusion. Mild degenerative change thoracic spine. Otherwise negative osseous structures. Worsening aeration from priors.  IMPRESSION: Bilateral lower lobe infiltrates, left greater than right.  Recommend follow-up radiographs until clear.   HUT:MLYYT tachycardia with T-wave inversion noted in the inferior leads.   ASSESSMENT AND PLAN:   1. MRSA Bacteremia: Patient has had a EGD in 06/11/2010 demonstrating normal esophagus without evidence of erythema, inflammation,  mass, or Barrett esophagus.She has not had a cardiac evaluation in the past. Plan for TEE in am at 10:30 with Dr.Rothbart. She will be will be kept NPO after midnight   2. Chest pain: Doubt cardiac etiology at this time. Troponin is negative but was found to have some T-wave abnormalities in the inferior leads during tachycarda on EKG. Risk factors include diabetes, hypertension, unknown lipid status. Will check lipids in  the am for risk stratification.   3. Hypertension: Blood pressure is currently well controlled.Recommend low dose ACE in the setting of diabetes for renal and endothelial protection.  4. Ongoing tobacco abuse: Counseling on cessation discussed with patient. Informed that this is serious risk factor for heart disease.    Signed: Phill Myron. Purcell Nails NP [facility] 08/08/2012, 9:09 AM Co-Sign MD

## 2012-08-09 NOTE — Progress Notes (Signed)
Jamie Bell NVB:[id number] DOB: [birth year] DOA: 08/03/2012 PCP: Gwendolyn Fill, MD   Subjective: This lady underwent TEE  this morning and I do not see a formal report yet but she was told that everything was fine. She feels somewhat lethargic post sedation. Otherwise her pain seems to be controlled in the chest.          Physical Exam: Blood pressure 175/83, pulse 96, temperature 99.1 F (37.3 C), temperature source Oral, resp. rate 25, height 5' 5"  (1.651 m), weight 94.802 kg (209 lb), last menstrual period 07/27/2012, SpO2 99.00%. She looks systemically well, appears to be in some pain. She is tender in the left anterior chest wall, reproducing her pain, as before. Lung fields are clear. There is no pleural. Heart sounds are present without pericardial rub. There are no heart murmurs. There are no clinical findings to suggest endocarditis.   Investigations:  Recent Results (from the past 240 hour(s))  CULTURE, BLOOD (ROUTINE X 2)     Status: None   Collection Time    08/04/12  2:49 AM      Result Value Range Status   Specimen Description BLOOD RIGHT HAND   Final   Special Requests     Final   Value: BOTTLES DRAWN AEROBIC AND ANAEROBIC AEB=6CC ANA=4CC   Culture  Setup Time 08/06/2012 22:58   Final   Culture     Final   Value: STAPHYLOCOCCUS AUREUS     Note: SUSCEPTIBILITIES PERFORMED ON PREVIOUS CULTURE WITHIN THE LAST 5 DAYS.     Note: Gram Stain Report Called to,Read Back By and Verified With: NEILSON T 08/06/12 2324 BY WOODIEJ Performed at [facility]   Report Status 08/09/2012 FINAL   Final  CULTURE, BLOOD (ROUTINE X 2)     Status: None   Collection Time    08/04/12  2:49 AM      Result Value Range Status   Specimen Description BLOOD LEFT HAND   Final   Special Requests BOTTLES DRAWN AEROBIC AND ANAEROBIC [facility] EACH   Final   Culture  Setup Time 08/05/2012 04:00   Final   Culture     Final   Value: METHICILLIN RESISTANT STAPHYLOCOCCUS AUREUS     Note:  RIFAMPIN AND GENTAMICIN SHOULD NOT BE USED AS SINGLE DRUGS FOR TREATMENT OF STAPH INFECTIONS. This organism DOES NOT demonstrate inducible Clindamycin resistance in vitro.     Note: Gram Stain Report Called to,Read Back By and Verified With: WALKER L @ 0430 08/05/12 BY FORSYTH K Performed at [place], READ BACK BY AND VERIFIED WITH: ERICA [facility] 08/06/12 @ 5:37PM BY RUSCA.   Report Status 08/07/2012 FINAL   Final   Organism ID, Bacteria METHICILLIN RESISTANT STAPHYLOCOCCUS AUREUS   Final  CULTURE, BLOOD (ROUTINE X 2)     Status: None   Collection Time    08/07/12  1:48 PM      Result Value Range Status   Specimen Description BLOOD LEFT HAND   Final   Special Requests BOTTLES DRAWN AEROBIC ONLY 6CC   Final   Culture NO GROWTH 2 DAYS   Final   Report Status PENDING   Incomplete  CULTURE, BLOOD (ROUTINE X 2)     Status: None   Collection Time    08/07/12  3:21 PM      Result Value Range Status   Specimen Description BLOOD RIGHT ARM   Final   Special Requests BOTTLES DRAWN AEROBIC ONLY [place]  Final   Culture NO GROWTH 2 DAYS   Final   Report Status PENDING   Incomplete  CULTURE, BLOOD (ROUTINE X 2)     Status: None   Collection Time    08/08/12  6:31 AM      Result Value Range Status   Specimen Description BLOOD RIGHT HAND   Final   Special Requests BOTTLES DRAWN AEROBIC AND ANAEROBIC 6CC   Final   Culture NO GROWTH 1 DAY   Final   Report Status PENDING   Incomplete  CULTURE, BLOOD (ROUTINE X 2)     Status: None   Collection Time    08/08/12  6:38 AM      Result Value Range Status   Specimen Description BLOOD LEFT ARM   Final   Special Requests BOTTLES DRAWN AEROBIC AND ANAEROBIC 6CC   Final   Culture NO GROWTH 1 DAY   Final   Report Status PENDING   Incomplete     Basic Metabolic Panel:  Recent Labs  08/07/12 0607 08/09/12 0541  NA 134* 135  K 3.5 3.6  CL 103 100  CO2 23 30  GLUCOSE 136* 88  BUN 9 7  CREATININE 0.64 0.70  CALCIUM 8.4  8.6   Liver Function Tests:  Recent Labs  08/07/12 0607  AST 46*  ALT 98*  ALKPHOS 227*  BILITOT 1.4*  PROT 7.6  ALBUMIN 2.2*     CBC:  Recent Labs  08/07/12 0607 08/09/12 0541  WBC 4.8 5.8  HGB 11.6* 10.6*  HCT 33.7* 31.0*  MCV 90.8 92.3  PLT 110* 162    Dg Chest Port 1 View  08/08/2012   *RADIOLOGY REPORT*  Clinical Data:  PICC line placement  PORTABLE CHEST - 1 VIEW  Comparison: Portable exam 1020 hours compared to 08/03/2012  Findings: Tip of right arm PICC line enters the right jugular vein and is directed cranially above the extent of this exam. Normal heart size, mediastinal contours and pulmonary vascularity. Lungs clear. No pleural effusion or pneumothorax. Bones unremarkable.  IMPRESSION: Tip of right arm PICC line projects over the right jugular vein directed cranially, tip above extent of portable chest radiograph. Recommend repositioning. PICC line nurse aware.   Original Report Authenticated By: Lavonia Dana, M.D.   Dg Chest Port 1v Same Day  08/08/2012   *RADIOLOGY REPORT*  Clinical Data: PICC line repositioning.  PORTABLE CHEST - 1 VIEW SAME DAY  Comparison: Radiographs earlier the same date.  Findings: 1115 hours.  The right arm PICC has been redirected inferiorly.  The catheter is not convincingly demonstrated inferior to the mid SVC level, although the mediastinum is incompletely penetrated.  There is mild atelectasis in both lung bases.  Heart size and mediastinal contours are stable.  IMPRESSION: Redirected right arm PICC inferiorly into the SVC.  The tip of the line is not convincingly demonstrated.   Original Report Authenticated By: Richardean Sale, M.D.      Medications: I have reviewed the patient's current medications.  Impression: 1. Left chest musculoskeletal pain. Left shoulder pain. 2. 2 cm x 4 cm mass in the anterior mediastinum, consistent with soft tissue trauma, unlikely to reflect infection per thoracic surgery. However MRSA bacteremia raises  the possibility of an infectious embolus. 3. MRSA bacteremia. 4. Polycystic ovarian syndrome. 5. Diabetes mellitus type 2. 6. Opioid Abuse. 7. Marijuana abuse. 8. Hypertension.      Plan: 1. Continue intravenous vancomycin. 2. Will discuss with Dr. Luan Pulling regarding repeat CT chest scan,  whether we do this now or in a couple weeks. 3. Hopefully, she can be discharged soon with prolonged intravenous vancomycin course of therapy.  Consultants:  Cardiology consultation pending.   Procedures:  None.   Antibiotics:  Rocephin intravenously started 08/04/2012. Discontinue 08/06/2012.  Vancomycin intravenously started 08/06/2012.                  Code Status: Full code.  Family Communication: Discussed plan with patient at the bedside.   Disposition Plan: Home when medically stable.  Time spent: 20 minutes.   LOS: 6 days   Doree Albee Pager [phone]  08/09/2012, 3:16 PM

## 2012-08-09 NOTE — H&P (View-Only) (Signed)
The patient was seen and examined, and I agree with the assessment and plan as documented above. She appears to have some musculoskeletal chest pain. Her blood cultures were positive for MRSA and is now on Vancomycin. Will plan for TEE to evaluate for potential valvular vegetations.

## 2012-08-09 NOTE — Progress Notes (Signed)
[place] for Vancomycin Indication: Bacteremia with gram positve cocci  Allergies  Allergen Reactions  . Ciprofloxacin Swelling  . Nsaids Hives   Patient Measurements: Height: 5' 5"  (165.1 cm) Weight: 209 lb (94.802 kg) IBW/kg (Calculated) : 57  Vital Signs: Temp: 99.1 F (37.3 C) (07/15 1010) Temp src: Oral (07/15 1010) BP: 134/82 mmHg (07/15 1010) Pulse Rate: 74 (07/15 1010) Intake/Output from previous day: 07/14 0701 - 07/15 0700 In: 1460 [P.O.:960; IV Piggyback:500] Out: -  Intake/Output from this shift:    Labs:  Recent Labs  08/07/12 0607 08/09/12 0541  WBC 4.8 5.8  HGB 11.6* 10.6*  PLT 110* 162  CREATININE 0.64 0.70   Estimated Creatinine Clearance: 105.3 ml/min (by C-G formula based on Cr of 0.7).  Recent Labs  08/07/12 1030 08/09/12 0850  VANCOTROUGH 13.0 16.0    Microbiology: Recent Results (from the past 720 hour(s))  CULTURE, BLOOD (ROUTINE X 2)     Status: None   Collection Time    08/04/12  2:49 AM      Result Value Range Status   Specimen Description BLOOD RIGHT HAND   Final   Special Requests     Final   Value: BOTTLES DRAWN AEROBIC AND ANAEROBIC AEB=6CC ANA=4CC   Culture  Setup Time 08/06/2012 22:58   Final   Culture     Final   Value: STAPHYLOCOCCUS AUREUS     Note: SUSCEPTIBILITIES PERFORMED ON PREVIOUS CULTURE WITHIN THE LAST 5 DAYS.     Note: Gram Stain Report Called to,Read Back By and Verified With: NEILSON T 08/06/12 2324 BY WOODIEJ Performed at [facility]   Report Status 08/09/2012 FINAL   Final  CULTURE, BLOOD (ROUTINE X 2)     Status: None   Collection Time    08/04/12  2:49 AM      Result Value Range Status   Specimen Description BLOOD LEFT HAND   Final   Special Requests BOTTLES DRAWN AEROBIC AND ANAEROBIC [facility] EACH   Final   Culture  Setup Time 08/05/2012 04:00   Final   Culture     Final   Value: METHICILLIN RESISTANT STAPHYLOCOCCUS AUREUS     Note: RIFAMPIN AND GENTAMICIN  SHOULD NOT BE USED AS SINGLE DRUGS FOR TREATMENT OF STAPH INFECTIONS. This organism DOES NOT demonstrate inducible Clindamycin resistance in vitro.     Note: Gram Stain Report Called to,Read Back By and Verified With: WALKER L @ 0430 08/05/12 BY FORSYTH K Performed at [place], READ BACK BY AND VERIFIED WITH: ERICA [facility] 08/06/12 @ 5:37PM BY RUSCA.   Report Status 08/07/2012 FINAL   Final   Organism ID, Bacteria METHICILLIN RESISTANT STAPHYLOCOCCUS AUREUS   Final  CULTURE, BLOOD (ROUTINE X 2)     Status: None   Collection Time    08/07/12  1:48 PM      Result Value Range Status   Specimen Description BLOOD LEFT HAND   Final   Special Requests BOTTLES DRAWN AEROBIC ONLY 6CC   Final   Culture NO GROWTH 1 DAY   Final   Report Status PENDING   Incomplete  CULTURE, BLOOD (ROUTINE X 2)     Status: None   Collection Time    08/07/12  3:21 PM      Result Value Range Status   Specimen Description BLOOD RIGHT ARM   Final   Special Requests BOTTLES DRAWN AEROBIC ONLY [place]   Final  Culture NO GROWTH 1 DAY   Final   Report Status PENDING   Incomplete  CULTURE, BLOOD (ROUTINE X 2)     Status: None   Collection Time    08/08/12  6:31 AM      Result Value Range Status   Specimen Description Peripheral   Final   Special Requests Normal   Final   Culture NO GROWTH <24 HRS   Final   Report Status PENDING   Incomplete  CULTURE, BLOOD (ROUTINE X 2)     Status: None   Collection Time    08/08/12  6:38 AM      Result Value Range Status   Specimen Description Peripheral   Final   Special Requests Normal   Final   Culture NO GROWTH <24 HRS   Final   Report Status PENDING   Incomplete   Medical History: Past Medical History  Diagnosis Date  . Diabetes mellitus   . Chronic back pain   . Hypertension   . PCOS (polycystic ovarian syndrome)   . Depression   . DDD (degenerative disc disease)   . Sciatica   . Decreased hearing on the right.    F/u by ENT in the  past and no further management.   Medications:  Scheduled:  . insulin aspart  0-5 Units Subcutaneous QHS  . insulin aspart  0-9 Units Subcutaneous TID WC  . insulin glargine  10 Units Subcutaneous QHS  . metFORMIN  1,000 mg Oral BID WC  . nicotine  21 mg Transdermal Daily  . pantoprazole  40 mg Oral Daily  . sodium chloride  10-40 mL Intracatheter Q12H  . vancomycin  2,000 mg Intravenous Q12H   Assessment: Blood culture shows MRSA Possible septic embolus Vancomycin prolonged therapy, minimum 2 weeks Vancomycin trough is now at goal TEE pending, f/u cardiology recommendations  Goal of Therapy:  Vancomycin trough level 15-20 mcg/ml  Plan:  Continue Vancomycin 2000 mg IV every 12 hours  Vancomycin trough level at least weekly during Rx Check SCr at least twice weekly during Rx Monitor renal function and f/u repeat cultures Labs per protocol  Hart Robinsons A 08/09/2012,10:24 AM

## 2012-08-09 NOTE — Progress Notes (Signed)
Jamie Bell  42 y.o.  female  Subjective: MRSA sepsis   Objective: Vital signs in last 24 hours: Temp:  [98.2 F (36.8 C)-99.2 F (37.3 C)] 99.2 F (37.3 C) (07/15 1533); afebrile since admission Pulse Rate:  [73-102] 84 (07/15 1533) Resp:  [15-29] 20 (07/15 1533) BP: (118-188)/(79-99) 124/84 mmHg (07/15 1533) SpO2:  [95 %-99 %] 95 % (07/15 1533) Weight:  [94.802 kg (209 lb)-95 kg (209 lb 7 oz)] 94.802 kg (209 lb) (07/15 1010)  94.802 kg (209 lb) Body mass index is 34.78 kg/(m^2).  Weight change: 0.334 kg (11.8 oz) Last BM Date: 08/08/12  General- Well developed; no acute distress; mildly to moderately overweight; alert and appropriate Neck- No JVD, no carotid bruits Lungs- clear lung fields; normal I:E ratio Cardiovascular- normal PMI; normal S1 and S2 Abdomen- normal bowel sounds; soft and non-tender without masses or organomegaly Skin- Warm, well-healed lesion over the left upper thigh Extremities- Nl distal pulses; no edema   Recent Labs  08/07/12 0607 08/09/12 0541  WBC 4.8 5.8  HGB 11.6* 10.6*  HCT 33.7* 31.0*  PLT 110* 162   BMET:  Recent Labs  08/07/12 0607 08/09/12 0541  NA 134* 135  K 3.5 3.6  CL 103 100  CO2 23 30  GLUCOSE 136* 88  BUN 9 7  CREATININE 0.64 0.70  CALCIUM 8.4 8.6   Hepatic Function:   Recent Labs  08/07/12 0607  PROT 7.6  ALBUMIN 2.2*  AST 46*  ALT 98*  ALKPHOS 227*  BILITOT 1.4*   EKG:   Imaging Studies/Results: Dg Chest Port 1 View  08/08/2012   *RADIOLOGY REPORT*  Clinical Data:  PICC line placement  PORTABLE CHEST - 1 VIEW  Comparison: Portable exam 1020 hours compared to 08/03/2012  Findings: Tip of right arm PICC line enters the right jugular vein and is directed cranially above the extent of this exam. Normal heart size, mediastinal contours and pulmonary vascularity. Lungs clear. No pleural effusion or pneumothorax. Bones unremarkable.  IMPRESSION: Tip of right arm PICC line projects over the right jugular  vein directed cranially, tip above extent of portable chest radiograph. Recommend repositioning. PICC line nurse aware.   Original Report Authenticated By: Lavonia Dana, M.D.   Dg Chest Port: 08/08/2012  Right PICC has been redirected inferiorly.  There is mild atelectasis in both lung bases.  Imaging: Imaging results have been reviewed  Medications:  I have reviewed the patient's current medications. Scheduled: . insulin aspart  0-5 Units Subcutaneous QHS  . insulin aspart  0-9 Units Subcutaneous TID WC  . insulin glargine  10 Units Subcutaneous QHS  . metFORMIN  1,000 mg Oral BID WC  . midazolam      . nicotine  21 mg Transdermal Daily  . pantoprazole  40 mg Oral Daily  . sodium chloride  10-40 mL Intracatheter Q12H  . vancomycin  2,000 mg Intravenous Q12H   Transesophageal echocardiogram: 08/09/2012  LVH; nl regional and global LV systolic function; no significant valvular abnormalities; catheter tip in the right atrium; no evidence for endocarditis.   Assessment/Plan: Negative transesophageal echocardiogram virtually rules out endocarditis either as a primary infection or as the result of sepsis. Abnormal LFTs may be secondary sepsis or may reflect a hepatic source. The skin lesion described by the patient over the left thigh certainly appears to be related, but is relatively remote to be the source of her sepsis on admission. She has no leukocytosis or fever. The source of infection is not  identified at present.  LOS: 6 days   Jacqulyn Ducking, M.D. 08/09/2012, 5:45 PM

## 2012-08-09 NOTE — Consult Note (Signed)
Consult requested by: [facility] hospitalist Consult requested for anterior mediastinal mass:  HPI: This is a 42 year old who was in her usual state of fair health at home when she developed left-sided chest discomfort and noticed that she had some swelling on the left side of her chest. Her pain went down into her left arm. 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 Telephone consultation with cardiothoracic surgery felt that this was related to costochondral pain but there is a little more concerned since she has MRSA bacteremia  Past Medical History  Diagnosis Date  . Diabetes mellitus   . Chronic back pain   . Hypertension   . PCOS (polycystic ovarian syndrome)   . Depression   . DDD (degenerative disc disease)   . Sciatica   . Decreased hearing on the right.    F/u by ENT in the past and no further management.     No family history on file.   History   Social History  . Marital Status: Married    Spouse Name: N/A    Number of Children: N/A  . Years of Education: N/A   Social History Main Topics  . Smoking status: Current Every Day Smoker -- 1.50 packs/day for 20 years    Types: Cigarettes  . Smokeless tobacco: Never Used     Comment: pain pills  . Alcohol Use: No  . Drug Use: Yes    Special: IV, Marijuana     Comment: prior hx of cocaine and marijuan-per patient not done in 4 years  . Sexually Active: Yes    Birth Control/ Protection: None, Abstinence   Other Topics Concern  . None   Social History Narrative  . None     ROS: She says she's had MRSA before. She had an abscess in her left groin area that drained spontaneously. She has some shortness of breath. She has a cough.    Objective: Vital signs in last 24 hours: Temp:  [98.2 F (36.8 C)-99.1 F (37.3 C)] 99.1 F (37.3 C) (07/15 0517) Pulse  Rate:  [73-102] 75 (07/15 0517) Resp:  [19-20] 20 (07/15 0517) BP: (126-147)/(79-90) 126/84 mmHg (07/15 0517) SpO2:  [95 %-99 %] 95 % (07/15 0517) Weight:  [95 kg (209 lb 7 oz)] 95 kg (209 lb 7 oz) (07/15 0517) Weight change: 0.334 kg (11.8 oz) Last BM Date: 08/08/12  Intake/Output from previous day: 07/14 0701 - 07/15 0700 In: 87 [P.O.:960; IV Piggyback:500] Out: -   PHYSICAL EXAM She is a well-developed well-nourished Caucasian female who is coughing in intervals during the examination. When she coughs she has chest discomfort. Her HEENT examination is generally unremarkable except she has thinning of her hair consistent with polycystic ovary syndrome. Her mucous membranes are moist. Her neck is supple without masses. Her chest is relatively clear with some rhonchi. Her chest wall shows that she does have palpable swelling at about the second or third rib  area where it joins  . The sternum. This area is tender. Her heart is regular. I do not hear a significant murmur. Her abdomen is soft. Extremities showed no edema and she does have what looks like area were abscess has drained  Lab Results: Basic Metabolic Panel:  Recent Labs  08/07/12 0607 08/09/12 0541  NA 134* 135  K  3.5 3.6  CL 103 100  CO2 23 30  GLUCOSE 136* 88  BUN 9 7  CREATININE 0.64 0.70  CALCIUM 8.4 8.6   Liver Function Tests:  Recent Labs  08/07/12 0607  AST 46*  ALT 98*  ALKPHOS 227*  BILITOT 1.4*  PROT 7.6  ALBUMIN 2.2*   No results found for this basename: LIPASE, AMYLASE,  in the last 72 hours No results found for this basename: AMMONIA,  in the last 72 hours CBC:  Recent Labs  08/07/12 0607 08/09/12 0541  WBC 4.8 5.8  HGB 11.6* 10.6*  HCT 33.7* 31.0*  MCV 90.8 92.3  PLT 110* 162   Cardiac Enzymes: No results found for this basename: CKTOTAL, CKMB, CKMBINDEX, TROPONINI,  in the last 72 hours BNP: No results found for this basename: PROBNP,  in the last 72 hours D-Dimer: No results  found for this basename: DDIMER,  in the last 72 hours CBG:  Recent Labs  08/07/12 2103 08/08/12 0800 08/08/12 1202 08/08/12 1622 08/08/12 2049 08/09/12 0753  GLUCAP 136* 123* 98 114* 126* 83   Hemoglobin A1C: No results found for this basename: HGBA1C,  in the last 72 hours Fasting Lipid Panel:  Recent Labs  08/09/12 0541  CHOL 119  HDL 8*  LDLCALC 68  TRIG 217*  CHOLHDL 14.9   Thyroid Function Tests: No results found for this basename: TSH, T4TOTAL, FREET4, T3FREE, THYROIDAB,  in the last 72 hours Anemia Panel: No results found for this basename: VITAMINB12, FOLATE, FERRITIN, TIBC, IRON, RETICCTPCT,  in the last 72 hours Coagulation: No results found for this basename: LABPROT, INR,  in the last 72 hours Urine Drug Screen: Drugs of Abuse     Component Value Date/Time   LABOPIA NONE DETECTED 08/04/2012 2010   [place] 08/04/2012 2010   [place] 08/04/2012 2010   [place] 08/04/2012 2010   [place] 08/04/2012 2010   [place] 08/04/2012 2010    Alcohol Level: No results found for this basename: ETH,  in the last 72 hours Urinalysis: No results found for this basename: COLORURINE, APPERANCEUR, LABSPEC, PHURINE, GLUCOSEU, HGBUR, BILIRUBINUR, KETONESUR, PROTEINUR, UROBILINOGEN, NITRITE, LEUKOCYTESUR,  in the last 72 hours Misc. Labs:   ABGS: No results found for this basename: PHART, PCO2, PO2ART, TCO2, HCO3,  in the last 72 hours   MICROBIOLOGY: Recent Results (from the past 240 hour(s))  CULTURE, BLOOD (ROUTINE X 2)     Status: None   Collection Time    08/04/12  2:49 AM      Result Value Range Status   Specimen Description BLOOD RIGHT HAND   Final   Special Requests     Final   Value: BOTTLES DRAWN AEROBIC AND ANAEROBIC AEB=6CC ANA=4CC   Culture  Setup Time 08/06/2012 22:58   Final   Culture     Final   Value: STAPHYLOCOCCUS AUREUS     Note: SUSCEPTIBILITIES PERFORMED ON PREVIOUS CULTURE  WITHIN THE LAST 5 DAYS.     Note: Gram Stain Report Called to,Read Back By and Verified With: NEILSON T 08/06/12 2324 BY WOODIEJ Performed at [facility]   Report Status 08/09/2012 FINAL   Final  CULTURE, BLOOD (ROUTINE X 2)     Status: None   Collection Time    08/04/12  2:49 AM      Result Value Range Status   Specimen Description BLOOD LEFT HAND   Final   Special Requests BOTTLES DRAWN AEROBIC  AND ANAEROBIC [facility] EACH   Final   Culture  Setup Time 08/05/2012 04:00   Final   Culture     Final   Value: METHICILLIN RESISTANT STAPHYLOCOCCUS AUREUS     Note: RIFAMPIN AND GENTAMICIN SHOULD NOT BE USED AS SINGLE DRUGS FOR TREATMENT OF STAPH INFECTIONS. This organism DOES NOT demonstrate inducible Clindamycin resistance in vitro.     Note: Gram Stain Report Called to,Read Back By and Verified With: WALKER L @ 0430 08/05/12 BY FORSYTH K Performed at [place], READ BACK BY AND VERIFIED WITH: ERICA [facility] 08/06/12 @ 5:37PM BY RUSCA.   Report Status 08/07/2012 FINAL   Final   Organism ID, Bacteria METHICILLIN RESISTANT STAPHYLOCOCCUS AUREUS   Final  CULTURE, BLOOD (ROUTINE X 2)     Status: None   Collection Time    08/07/12  1:48 PM      Result Value Range Status   Specimen Description BLOOD LEFT HAND   Final   Special Requests BOTTLES DRAWN AEROBIC ONLY 6CC   Final   Culture NO GROWTH 1 DAY   Final   Report Status PENDING   Incomplete  CULTURE, BLOOD (ROUTINE X 2)     Status: None   Collection Time    08/07/12  3:21 PM      Result Value Range Status   Specimen Description BLOOD RIGHT ARM   Final   Special Requests BOTTLES DRAWN AEROBIC ONLY 6CC   Final   Culture NO GROWTH 1 DAY   Final   Report Status PENDING   Incomplete  CULTURE, BLOOD (ROUTINE X 2)     Status: None   Collection Time    08/08/12  6:31 AM      Result Value Range Status   Specimen Description Peripheral   Final   Special Requests Normal   Final   Culture NO GROWTH <24 HRS    Final   Report Status PENDING   Incomplete  CULTURE, BLOOD (ROUTINE X 2)     Status: None   Collection Time    08/08/12  6:38 AM      Result Value Range Status   Specimen Description Peripheral   Final   Special Requests Normal   Final   Culture NO GROWTH <24 HRS   Final   Report Status PENDING   Incomplete    Studies/Results: Dg Chest Port 1 View  08/08/2012   *RADIOLOGY REPORT*  Clinical Data:  PICC line placement  PORTABLE CHEST - 1 VIEW  Comparison: Portable exam 1020 hours compared to 08/03/2012  Findings: Tip of right arm PICC line enters the right jugular vein and is directed cranially above the extent of this exam. Normal heart size, mediastinal contours and pulmonary vascularity. Lungs clear. No pleural effusion or pneumothorax. Bones unremarkable.  IMPRESSION: Tip of right arm PICC line projects over the right jugular vein directed cranially, tip above extent of portable chest radiograph. Recommend repositioning. PICC line nurse aware.   Original Report Authenticated By: Lavonia Dana, M.D.   Dg Chest Port 1v Same Day  08/08/2012   *RADIOLOGY REPORT*  Clinical Data: PICC line repositioning.  PORTABLE CHEST - 1 VIEW SAME DAY  Comparison: Radiographs earlier the same date.  Findings: 1115 hours.  The right arm PICC has been redirected inferiorly.  The catheter is not convincingly demonstrated inferior to the mid SVC level, although the mediastinum is incompletely penetrated.  There is mild atelectasis in both lung bases.  Heart size  and mediastinal contours are stable.  IMPRESSION: Redirected right arm PICC inferiorly into the SVC.  The tip of the line is not convincingly demonstrated.   Original Report Authenticated By: Richardean Sale, M.D.    Medications:  Prior to Admission:  Prescriptions prior to admission  Medication Sig Dispense Refill  . [DISCONTINUED] ibuprofen (ADVIL,MOTRIN) 200 MG tablet Take 600 mg by mouth every 6 (six) hours as needed for pain.       Scheduled: . insulin  aspart  0-5 Units Subcutaneous QHS  . insulin aspart  0-9 Units Subcutaneous TID WC  . insulin glargine  10 Units Subcutaneous QHS  . metFORMIN  1,000 mg Oral BID WC  . nicotine  21 mg Transdermal Daily  . pantoprazole  40 mg Oral Daily  . sodium chloride  10-40 mL Intracatheter Q12H  . vancomycin  2,000 mg Intravenous Q12H   Continuous:  VDP:BAQVOHCSPZZCK, alum & mag hydroxide-simeth, diphenhydrAMINE, ondansetron (ZOFRAN) IV, oxyCODONE, polyethylene glycol, senna-docusate, sodium chloride, traZODone  Assesment: She has MRSA bacteremia probably from a skin infection. She has a mediastinal mass which may be related to costochondritis but of course there is more concerned now that it is known that she has bacteremia. This did not appear to be an abscess based on CT. It may be worth repeating CT scan to see if the area has changed and particularly if it is any larger I think we should consider needle aspiration/biopsy Active Problems:   DM, UNCOMPLICATED, TYPE II   POLYCYSTIC OVARIAN DISEASE   TOBACCO USER   HYPERTENSION, BENIGN   BACK PAIN, CHRONIC   Chest wall pain   H/O opioid abuse    Plan: Continue current IV antibiotics. I will discuss with the hospitalist attending regarding CT scan and whether we should do that now or wait until she's been on the antibiotics for a longer period    LOS: 6 days   Jamie Bell L 08/09/2012, 8:46 AM

## 2012-08-09 NOTE — Interval H&P Note (Signed)
History and Physical Interval Note:  08/09/2012 10:09 AM  Jamie Bell  has presented today for transesophageal echocardiography, with the diagnosis of staph sepsis  The various methods of treatment have been discussed with the patient and family. After consideration of risks, benefits and other options for treatment, the patient has consented to TRANSESOPHAGEAL ECHOCARDIOGRAM (TEE) .  The patient's history has been reviewed, patient examined, no change in status, stable for surgery.  I have reviewed the patient's chart and labs.  Questions were answered to the patient's satisfaction.     Jacqulyn Ducking, M. D.

## 2012-08-09 NOTE — Progress Notes (Signed)
*  PRELIMINARY RESULTS* Echocardiogram Echocardiogram Transesophageal has been performed.  Tera Partridge 08/09/2012, 11:55 AM

## 2012-08-10 ENCOUNTER — Encounter (HOSPITAL_COMMUNITY): Payer: Self-pay | Admitting: Cardiology

## 2012-08-10 LAB — BASIC METABOLIC PANEL
BUN: 6 mg/dL (ref 6–23)
Chloride: 101 mEq/L (ref 96–112)
GFR calc Af Amer: >90 mL/min (ref >90)
Glucose, Bld: 133 mg/dL — ABNORMAL HIGH (ref 70–99)
Potassium: 4 mEq/L (ref 3.5–5.1)
Sodium: 136 mEq/L (ref 135–145)

## 2012-08-10 LAB — GLUCOSE, CAPILLARY: Glucose-Capillary: 148 mg/dL — ABNORMAL HIGH (ref 70–99)

## 2012-08-10 MED ORDER — METFORMIN HCL 1000 MG PO TABS: 1000.0000 mg | ORAL_TABLET | Freq: Two times a day (BID) | ORAL | Status: DC

## 2012-08-10 MED ORDER — VANCOMYCIN HCL 10 G IV SOLR: 2000.0000 mg | Freq: Two times a day (BID) | INTRAVENOUS | Status: DC

## 2012-08-10 MED ORDER — OXYCODONE HCL 5 MG PO TABS: 5.0000 mg | ORAL_TABLET | ORAL | Status: DC | PRN

## 2012-08-10 NOTE — Progress Notes (Signed)
Subjective: She says she still having some chest discomfort. Apparently her transesophageal echocardiogram did not show vegetations  Objective: Vital signs in last 24 hours: Temp:  [98.4 F (36.9 C)-99.2 F (37.3 C)] 98.5 F (36.9 C) (07/16 0640) Pulse Rate:  [74-96] 76 (07/16 0640) Resp:  [15-29] 20 (07/16 0640) BP: (118-188)/(79-99) 166/95 mmHg (07/16 0640) SpO2:  [94 %-99 %] 94 % (07/16 0305) Weight:  [94.802 kg (209 lb)-95.165 kg (209 lb 12.8 oz)] 95.165 kg (209 lb 12.8 oz) (07/16 0640) Weight change: -0.198 kg (-7 oz) Last BM Date: 08/08/12  Intake/Output from previous day: 07/15 0701 - 07/16 0700 In: 1930 [P.O.:720; I.V.:210; IV Piggyback:1000] Out: -   PHYSICAL EXAM General appearance: alert, cooperative and mild distress Resp: clear to auscultation bilaterally Cardio: regular rate and rhythm, S1, S2 normal, no murmur, click, rub or gallop GI: soft, non-tender; bowel sounds normal; no masses,  no organomegaly Extremities: extremities normal, atraumatic, no cyanosis or edema  Lab Results:    Basic Metabolic Panel:  Recent Labs  08/09/12 0541 08/10/12 0641  NA 135 136  K 3.6 4.0  CL 100 101  CO2 30 30  GLUCOSE 88 133*  BUN 7 6  CREATININE 0.70 0.64  CALCIUM 8.6 8.9   Liver Function Tests: No results found for this basename: AST, ALT, ALKPHOS, BILITOT, PROT, ALBUMIN,  in the last 72 hours No results found for this basename: LIPASE, AMYLASE,  in the last 72 hours No results found for this basename: AMMONIA,  in the last 72 hours CBC:  Recent Labs  08/09/12 0541  WBC 5.8  HGB 10.6*  HCT 31.0*  MCV 92.3  PLT 162   Cardiac Enzymes: No results found for this basename: CKTOTAL, CKMB, CKMBINDEX, TROPONINI,  in the last 72 hours BNP: No results found for this basename: PROBNP,  in the last 72 hours D-Dimer: No results found for this basename: DDIMER,  in the last 72 hours CBG:  Recent Labs  08/09/12 0753 08/09/12 1019 08/09/12 1143 08/09/12 1631  08/09/12 2151 08/10/12 0748  GLUCAP 83 87 75 93 151* 148*   Hemoglobin A1C: No results found for this basename: HGBA1C,  in the last 72 hours Fasting Lipid Panel:  Recent Labs  08/09/12 0541  CHOL 119  HDL 8*  LDLCALC 68  TRIG 217*  CHOLHDL 14.9   Thyroid Function Tests: No results found for this basename: TSH, T4TOTAL, FREET4, T3FREE, THYROIDAB,  in the last 72 hours Anemia Panel: No results found for this basename: VITAMINB12, FOLATE, FERRITIN, TIBC, IRON, RETICCTPCT,  in the last 72 hours Coagulation: No results found for this basename: LABPROT, INR,  in the last 72 hours Urine Drug Screen: Drugs of Abuse     Component Value Date/Time   LABOPIA NONE DETECTED 08/04/2012 2010   [place] 08/04/2012 2010   [place] 08/04/2012 2010   [place] 08/04/2012 2010   THCU NONE DETECTED 08/04/2012 2010   [place] 08/04/2012 2010    Alcohol Level: No results found for this basename: ETH,  in the last 72 hours Urinalysis: No results found for this basename: COLORURINE, APPERANCEUR, LABSPEC, PHURINE, GLUCOSEU, HGBUR, BILIRUBINUR, KETONESUR, PROTEINUR, UROBILINOGEN, NITRITE, LEUKOCYTESUR,  in the last 72 hours Misc. Labs:  ABGS No results found for this basename: PHART, PCO2, PO2ART, TCO2, HCO3,  in the last 72 hours CULTURES Recent Results (from the past 240 hour(s))  CULTURE, BLOOD (ROUTINE X 2)     Status: None   Collection Time  08/04/12  2:49 AM      Result Value Range Status   Specimen Description BLOOD RIGHT HAND   Final   Special Requests     Final   Value: BOTTLES DRAWN AEROBIC AND ANAEROBIC AEB=6CC ANA=4CC   Culture  Setup Time 08/06/2012 22:58   Final   Culture     Final   Value: STAPHYLOCOCCUS AUREUS     Note: SUSCEPTIBILITIES PERFORMED ON PREVIOUS CULTURE WITHIN THE LAST 5 DAYS.     Note: Gram Stain Report Called to,Read Back By and Verified With: NEILSON T 08/06/12 2324 BY WOODIEJ Performed at [facility]   Report Status 08/09/2012 FINAL   Final  CULTURE, BLOOD (ROUTINE X 2)     Status: None   Collection Time    08/04/12  2:49 AM      Result Value Range Status   Specimen Description BLOOD LEFT HAND   Final   Special Requests BOTTLES DRAWN AEROBIC AND ANAEROBIC [facility] EACH   Final   Culture  Setup Time 08/05/2012 04:00   Final   Culture     Final   Value: METHICILLIN RESISTANT STAPHYLOCOCCUS AUREUS     Note: RIFAMPIN AND GENTAMICIN SHOULD NOT BE USED AS SINGLE DRUGS FOR TREATMENT OF STAPH INFECTIONS. This organism DOES NOT demonstrate inducible Clindamycin resistance in vitro.     Note: Gram Stain Report Called to,Read Back By and Verified With: WALKER L @ 0430 08/05/12 BY FORSYTH K Performed at [facility], READ BACK BY AND VERIFIED WITH: ERICA [facility] 08/06/12 @ 5:37PM BY RUSCA.   Report Status 08/07/2012 FINAL   Final   Organism ID, Bacteria METHICILLIN RESISTANT STAPHYLOCOCCUS AUREUS   Final  CULTURE, BLOOD (ROUTINE X 2)     Status: None   Collection Time    08/07/12  1:48 PM      Result Value Range Status   Specimen Description BLOOD LEFT HAND   Final   Special Requests BOTTLES DRAWN AEROBIC ONLY 6CC   Final   Culture NO GROWTH 2 DAYS   Final   Report Status PENDING   Incomplete  CULTURE, BLOOD (ROUTINE X 2)     Status: None   Collection Time    08/07/12  3:21 PM      Result Value Range Status   Specimen Description BLOOD RIGHT ARM   Final   Special Requests BOTTLES DRAWN AEROBIC ONLY 6CC   Final   Culture NO GROWTH 2 DAYS   Final   Report Status PENDING   Incomplete  CULTURE, BLOOD (ROUTINE X 2)     Status: None   Collection Time    08/08/12  6:31 AM      Result Value Range Status   Specimen Description BLOOD RIGHT HAND   Final   Special Requests BOTTLES DRAWN AEROBIC AND ANAEROBIC 6CC   Final   Culture NO GROWTH 1 DAY   Final   Report Status PENDING   Incomplete  CULTURE, BLOOD (ROUTINE X 2)     Status: None   Collection Time     08/08/12  6:38 AM      Result Value Range Status   Specimen Description BLOOD LEFT ARM   Final   Special Requests BOTTLES DRAWN AEROBIC AND ANAEROBIC 6CC   Final   Culture NO GROWTH 1 DAY   Final   Report Status PENDING   Incomplete   Studies/Results: Dg Chest Port 1 View  08/08/2012   *RADIOLOGY REPORT*  Clinical Data:  PICC line placement  PORTABLE CHEST - 1 VIEW  Comparison: Portable exam 1020 hours compared to 08/03/2012  Findings: Tip of right arm PICC line enters the right jugular vein and is directed cranially above the extent of this exam. Normal heart size, mediastinal contours and pulmonary vascularity. Lungs clear. No pleural effusion or pneumothorax. Bones unremarkable.  IMPRESSION: Tip of right arm PICC line projects over the right jugular vein directed cranially, tip above extent of portable chest radiograph. Recommend repositioning. PICC line nurse aware.   Original Report Authenticated By: Lavonia Dana, M.D.   Dg Chest Port 1v Same Day  08/08/2012   *RADIOLOGY REPORT*  Clinical Data: PICC line repositioning.  PORTABLE CHEST - 1 VIEW SAME DAY  Comparison: Radiographs earlier the same date.  Findings: 1115 hours.  The right arm PICC has been redirected inferiorly.  The catheter is not convincingly demonstrated inferior to the mid SVC level, although the mediastinum is incompletely penetrated.  There is mild atelectasis in both lung bases.  Heart size and mediastinal contours are stable.  IMPRESSION: Redirected right arm PICC inferiorly into the SVC.  The tip of the line is not convincingly demonstrated.   Original Report Authenticated By: Richardean Sale, M.D.    Medications:  Scheduled: . insulin aspart  0-5 Units Subcutaneous QHS  . insulin aspart  0-9 Units Subcutaneous TID WC  . insulin glargine  10 Units Subcutaneous QHS  . metFORMIN  1,000 mg Oral BID WC  . nicotine  21 mg Transdermal Daily  . pantoprazole  40 mg Oral Daily  . sodium chloride  10-40 mL Intracatheter Q12H  .  vancomycin  2,000 mg Intravenous Q12H   Continuous: . sodium chloride 1,000 mL (08/09/12 1014)   CHE:NIDPOEUMPNTIR, alum & mag hydroxide-simeth, butamben-tetracaine-benzocaine, diphenhydrAMINE, midazolam, ondansetron (ZOFRAN) IV, oxyCODONE, polyethylene glycol, senna-docusate, sodium chloride, traZODone  Assesment: I discussed her situation with Dr. Anastasio Champion. She will need 6 weeks of IV antibiotics. We plan repeat CT in 7-10 days. Active Problems:   DM, UNCOMPLICATED, TYPE II   POLYCYSTIC OVARIAN DISEASE   TOBACCO USER   HYPERTENSION, BENIGN   BACK PAIN, CHRONIC   Chest wall pain   H/O opioid abuse    Plan: As above    LOS: 7 days   Sharonne Ricketts L 08/10/2012, 8:53 AM

## 2012-08-10 NOTE — Progress Notes (Signed)
Pt. D/c instructions provided, prescriptions provided, PICC line in place and in tact, follow up apts. Reviewed, pt. Encouraged to keep follow up apts.

## 2012-08-10 NOTE — Discharge Summary (Signed)
Physician Discharge Summary  Jamie Bell QZR:[id number] DOB: 1970-11-23 DOA: 08/03/2012  PCP: Gwendolyn Fill, MD  Admit date: 08/03/2012 Discharge date: 08/10/2012  Time spent: Greater than 30 minutes  Recommendations for Outpatient Follow-up:  1. Followup with Dr. Luan Pulling regarding repeat CT chest scan for mass seen in the anterior mediastinum. 2. Home health services for prolonged intravenous vancomycin therapy. Further 5 weeks of therapy is appropriate. 3. Followup with endocrinology regarding polycystic ovarian syndrome and diabetes.  Discharge Diagnoses:  1. MRSA bacteremia. No evidence of endocarditis on TEE.  2. 1.9x4.0 cm irregular soft tissue lesion/complex fluid in the anterior mediastinum, unclear etiology. Will need repeat CT scan in the next couple weeks. 3. Polycystic ovarian disease. 4. Type 2 diabetes mellitus. 5. Obesity. 6. Chest wall pain secondary to #2. 7. Abnormal liver enzymes, possible early cirrhosis on CT scan. Will need followup with primary care physician.   Discharge Condition: Stable.  Diet recommendation: Carbohydrate modified diet.  Filed Weights   08/09/12 0517 08/09/12 1010 08/10/12 0640  Weight: 95 kg (209 lb 7 oz) 94.802 kg (209 lb) 95.165 kg (209 lb 12.8 oz)    History of present illness:  This 42 year old lady presented to the hospital with symptoms of chest pain. Please see initial history as outlined below, HPI:  Jamie Bell is a 41 y.o. female. Young lady with history diabetes as well as of opioid dependence and abuse presents to the emergency room complaining of chest pain for 4 days after moving a couch with her husband. She was noted to have exquisite tenderness of the upper chest and a CT angiogram of the chest, revealed an abnormality of the anterior mediastinum up unclear character.  She denies direct trauma to the chest. Denies fever but does report chills;  The pain is aggravated by movement and breathing and radiates  up into her left shoulder;  She also reports tenderness in the upper posterior chest but not as much as a front  She also reports an allergy to NSAIDs, but after review of her home medications she has been taking ibuprofen, and reports she takes it with Benadryl. The addition of NSAIDS to her allergy record is only 46 months old. She has previously been refused narcotics at her primary physician's office.  Hospital Course:  The patient was admitted and initially it was felt that the patient's chest pain was musculoskeletal. Serial cardiac enzymes were negative. CT angiogram of the chest revealed an abnormality in the anterior mediastinum. A formal CT scan of the chest with contrast showed this abnormality as a fluid-filled mass/lesion, etiology unclear. In the meantime blood cultures had already been taken in view of the fever and they were positive for MRSA bacteremia. I did have a discussion with her cardiothoracic surgeon regarding the CT scan findings, he felt these were benign but in view of the MRSA bacteremia, I think it is possible this may represent a septic embolus and intravenous antibiotics for 6 week course as appropriate with vancomycin. Repeat CT scan of the chest should be done in 2 weeks' time and Dr. Luan Pulling, pulmonology, who was following the patient  as an inpatient will follow this as an outpatient. We have arranged advanced home care to follow the intravenous vancomycin. She has not had any fevers, she still continues to have some chest pain and which has been helped with oxycodone. She is stable for discharge at this point. Procedures:  Transesophageal echocardiogram, no evidence of vegetations.   Consultations:  Cardiology, Dr. Lattie Haw.  Pulmonology, Dr. Luan Pulling.  Discharge Exam: Filed Vitals:   08/09/12 1533 08/09/12 2249 08/10/12 0305 08/10/12 0640  BP: 124/84 143/87 138/90 166/95  Pulse: 84 78 78 76  Temp: 99.2 F (37.3 C) 99.2 F (37.3 C) 98.4 F (36.9 C) 98.5 F  (36.9 C)  TempSrc:  Oral Oral Oral  Resp: 20 20 20 20   Height:      Weight:    95.165 kg (209 lb 12.8 oz)  SpO2: 95% 99% 94%     General: She looks systemically well. She is not toxic or septic. Cardiovascular: Heart sounds are present without any murmurs or added sounds. There is no evidence of endocarditis clinically. Respiratory: Lung fields are clear. There is anterior chest wall pain, reproducing her pain. She is alert and orientated.  Discharge Instructions  Discharge Orders   Future Orders Complete By Expires     Diet - low sodium heart healthy  As directed     Diet - low sodium heart healthy  As directed     Increase activity slowly  As directed     Increase activity slowly  As directed         Medication List    STOP taking these medications       ibuprofen 200 MG tablet  Commonly known as:  ADVIL,MOTRIN      TAKE these medications       ketorolac 10 MG tablet  Commonly known as:  TORADOL  Take 1 tablet (10 mg total) by mouth every 6 (six) hours as needed for pain.     metFORMIN 1000 MG tablet  Commonly known as:  GLUCOPHAGE  Take 1 tablet (1,000 mg total) by mouth 2 (two) times daily with a meal.     oxyCODONE 5 MG immediate release tablet  Commonly known as:  Oxy IR/ROXICODONE  Take 1-2 tablets (5-10 mg total) by mouth every 4 (four) hours as needed.     sodium chloride 0.9 % SOLN 500 mL with vancomycin 10 G SOLR 2,000 mg  Inject 2,000 mg into the vein every 12 (twelve) hours.       Allergies  Allergen Reactions  . Ciprofloxacin Swelling  . Nsaids Hives       Follow-up Information   Follow up with [facility], MD On 08/23/2012. (at 10:00)    Contact information:   [address] [place] [address] [phone]       Follow up with NIDA,GEBRESELASSIE, MD. (will need to call and schedule appointment)    Contact information:   [place] [address] [phone]       Follow up with HAWKINS,EDWARD L,  MD. Schedule an appointment as soon as possible for a visit in 2 weeks.   Contact information:   [place] [address] [phone]        The results of significant diagnostics from this hospitalization (including imaging, microbiology, ancillary and laboratory) are listed below for reference.    Significant Diagnostic Studies: Dg Chest 2 View  08/03/2012   *RADIOLOGY REPORT*  Clinical Data: Left-sided chest pain.  Smoker.  CHEST - 2 VIEW  Comparison: 11/27/2010.  Findings: Bilateral lower lobe pulmonary opacities consistent with pneumonia.  This is greater on the left.  Normal heart size.  No effusion or pneumothorax.  Previous cervical fusion. Mild degenerative change thoracic spine.  Otherwise negative osseous structures.  Worsening aeration from priors.  IMPRESSION: Bilateral lower lobe infiltrates, left greater than right.  Recommend follow-up radiographs until clear.   Original Report Authenticated By: Rolla Flatten, M.D.   Ct Chest W Contrast  08/05/2012   *RADIOLOGY REPORT*  Clinical Data: Chest pain, possible mediastinal mass  CT CHEST WITH CONTRAST  Technique:  Multidetector CT imaging of the chest was performed following the standard protocol during bolus administration of intravenous contrast.  Contrast: 60m OMNIPAQUE IOHEXOL 300 MG/ML  SOLN  Comparison: CT chest dated 08/03/2012.  CT chest abdomen pelvis dated 11/18/2010.  Findings: Small bilateral pleural effusions with associated lower lobe opacities, likely atelectasis.  No suspicious pulmonary nodules.  No pneumothorax.  Stable 1.9 x 4.0 cm irregular soft tissue lesion/complex fluid in the anterior mediastinum (series 2/image 21).  On the sagittal view, there is associated stranding anterior to the sternomanubrial joint (sagittal image 61), which raises concern for infection.  No definite underlying osseous abnormality or fracture.  Additional differential considerations include hemorrhage (in the  setting of trauma) or possibly thymic hyperplasia.  Neoplasm such as lymphoma is considered unlikely but cannot be completely excluded.  The heart is normal in size.  No pericardial effusion.  Small mediastinal lymph nodes measuring up to 8 mm short axis (series 2/image 19).  No suspicious axillary lymphadenopathy.  Visualized upper abdomen is notable for cholecystectomy clips and small upper abdominal lymph nodes measuring up to 1.3 cm short axis (series 2/image 55), minimally increased from 2012. 13 mm short axis right cardiophrenic angle node, previously 9 mm.  A very subtle nodular hepatic contour is possible, raising the possibility of early cirrhosis.  Mild degenerative changes of the visualized thoracolumbar spine. No evidence of sternal fracture.  IMPRESSION: 1.9 x 4.0 cm irregular soft tissue lesion / complex fluid in the anterior mediastinum, unchanged from recent CT.  This appears to be associated with the sternomanubrial joint and is worrisome for infection.  Less likely, this could reflect hemorrhage (in the setting of trauma) or possibly thymic hyperplasia.  Neoplasm such as lymphoma is considered unlikely.  Small bilateral pleural effusions with associated lower lobe atelectasis.  Possible early cirrhosis.  Small upper abdominal lymph nodes, similar versus minimally increased from 2012, favored to be reactive.   Original Report Authenticated By: SJulian Hy M.D.   Ct Angio Chest Pe W/cm &/or Wo Cm  08/03/2012   *RADIOLOGY REPORT*  Clinical Data: Chest pain, weakness, hypertension, diabetes, question pulmonary embolism  CT ANGIOGRAPHY CHEST  Technique:  Multidetector CT imaging of the chest using the standard protocol during bolus administration of intravenous contrast. Multiplanar reconstructed images including MIPs were obtained and reviewed to evaluate the vascular anatomy.  Contrast: 1061mOMNIPAQUE IOHEXOL 350 MG/ML SOLN  Comparison: 11/18/2010  Findings: Aorta normal caliber without gross  aneurysm or dissection. Mild retrosternal soft tissue density, increased from previous exam, nonspecific. Scattered normal-sized mediastinal lymph nodes with a single enlarged right cardiophrenic angle lymph node 17 x 11 mm. Spleen appears enlarged. Cannot exclude hepatic enlargement as well. Degradation of image quality secondary to body habitus.  Respiratory motion artifacts at lower lungs. Pulmonary arteries grossly patent. No evidence of pulmonary embolism. Coronary arterial calcifications noted. Dependent atelectasis at both lung bases. No acute infiltrate, pleural effusion, or pneumothorax. No acute osseous findings.  IMPRESSION: No evidence of pulmonary embolism. Bibasilar atelectasis. Abnormal density within anterior mediastinum, somewhat poorly defined, does not appear to represent a well-defined mass. Differential diagnosis would include thymic hyperplasia, hemorrhage in the setting of acute trauma, tumor and infection considered less likely. Single nonspecific minimally enlarged right cardiophrenic angle lymph node.  Premature coronary arterial calcifications and question hepatosplenomegaly.   Original Report Authenticated By: Lavonia Dana, M.D.   US Abdomen Complete  08/04/2012   *RADIOLOGY REPORT*  Clinical Data:  Abnormal liver enzymes.  Question positive Murphy's sign.  COMPLETE ABDOMINAL ULTRASOUND  Comparison:  CT 11/18/2010  Findings:  Gallbladder:  Prior cholecystectomy.  On  Common bile duct:   Slightly prominent, 8 mm, likely related to post cholecystectomy state.  Liver:  Possible mild hepatomegaly.  Craniocaudal length is 20 cm. Normal echotexture.  No focal abnormality.  No biliary ductal dilatation.  IVC:  Appears normal.  Pancreas:  No focal abnormality seen.  Spleen:  Although the splenic length is within normal limits (11.8 cm), the splenic volume is increased, measuring 706 ml.  No focal abnormality.  Right Kidney:   Normal in size and parenchymal echogenicity.  No evidence of mass or  hydronephrosis.  Left Kidney:  Normal in size and parenchymal echogenicity.  No evidence of mass or hydronephrosis.  Abdominal aorta:  No aneurysm identified.  IMPRESSION: Suspect hepatosplenomegaly.  Prior cholecystectomy.   Original Report Authenticated By: Rolm Baptise, M.D.   Dg Chest Port 1 View  08/08/2012   *RADIOLOGY REPORT*  Clinical Data:  PICC line placement  PORTABLE CHEST - 1 VIEW  Comparison: Portable exam 1020 hours compared to 08/03/2012  Findings: Tip of right arm PICC line enters the right jugular vein and is directed cranially above the extent of this exam. Normal heart size, mediastinal contours and pulmonary vascularity. Lungs clear. No pleural effusion or pneumothorax. Bones unremarkable.  IMPRESSION: Tip of right arm PICC line projects over the right jugular vein directed cranially, tip above extent of portable chest radiograph. Recommend repositioning. PICC line nurse aware.   Original Report Authenticated By: Lavonia Dana, M.D.   Dg Chest Port 1v Same Day  08/08/2012   *RADIOLOGY REPORT*  Clinical Data: PICC line repositioning.  PORTABLE CHEST - 1 VIEW SAME DAY  Comparison: Radiographs earlier the same date.  Findings: 1115 hours.  The right arm PICC has been redirected inferiorly.  The catheter is not convincingly demonstrated inferior to the mid SVC level, although the mediastinum is incompletely penetrated.  There is mild atelectasis in both lung bases.  Heart size and mediastinal contours are stable.  IMPRESSION: Redirected right arm PICC inferiorly into the SVC.  The tip of the line is not convincingly demonstrated.   Original Report Authenticated By: Richardean Sale, M.D.   Dg Shoulder Left  08/05/2012   *RADIOLOGY REPORT*  Clinical Data: Left shoulder pain.  LEFT SHOULDER - 2+ VIEW  Comparison: Plain films left shoulder 10/11/2010.  Findings: The humerus is located and the acromioclavicular joint is intact.  Mild to moderate acromioclavicular degenerative change is seen.  There  is no fracture.  Imaged left lung and ribs are unremarkable.  IMPRESSION: No acute finding or interval change.  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   Original Report Authenticated By: Orlean Patten, M.D.    Microbiology: Recent Results (from the past 240 hour(s))  CULTURE, BLOOD (ROUTINE X 2)     Status: None   Collection Time    08/04/12  2:49 AM      Result Value Range Status   Specimen Description BLOOD RIGHT HAND   Final   Special Requests     Final   Value: BOTTLES DRAWN AEROBIC AND ANAEROBIC AEB=6CC ANA=4CC   Culture  Setup Time 08/06/2012 22:58   Final   Culture     Final  Value: STAPHYLOCOCCUS AUREUS     Note: SUSCEPTIBILITIES PERFORMED ON PREVIOUS CULTURE WITHIN THE LAST 5 DAYS.     Note: Gram Stain Report Called to,Read Back By and Verified With: NEILSON T 08/06/12 2324 BY WOODIEJ Performed at [facility]   Report Status 08/09/2012 FINAL   Final  CULTURE, BLOOD (ROUTINE X 2)     Status: None   Collection Time    08/04/12  2:49 AM      Result Value Range Status   Specimen Description BLOOD LEFT HAND   Final   Special Requests BOTTLES DRAWN AEROBIC AND ANAEROBIC [facility] EACH   Final   Culture  Setup Time 08/05/2012 04:00   Final   Culture     Final   Value: METHICILLIN RESISTANT STAPHYLOCOCCUS AUREUS     Note: RIFAMPIN AND GENTAMICIN SHOULD NOT BE USED AS SINGLE DRUGS FOR TREATMENT OF STAPH INFECTIONS. This organism DOES NOT demonstrate inducible Clindamycin resistance in vitro.     Note: Gram Stain Report Called to,Read Back By and Verified With: WALKER L @ 0430 08/05/12 BY FORSYTH K Performed at [place], READ BACK BY AND VERIFIED WITH: ERICA [facility] 08/06/12 @ 5:37PM BY RUSCA.   Report Status 08/07/2012 FINAL   Final   Organism ID, Bacteria METHICILLIN RESISTANT STAPHYLOCOCCUS AUREUS   Final  CULTURE, BLOOD (ROUTINE X 2)     Status: None   Collection Time    08/07/12  1:48 PM      Result Value Range  Status   Specimen Description BLOOD LEFT HAND   Final   Special Requests BOTTLES DRAWN AEROBIC ONLY 6CC   Final   Culture NO GROWTH 3 DAYS   Final   Report Status PENDING   Incomplete  CULTURE, BLOOD (ROUTINE X 2)     Status: None   Collection Time    08/07/12  3:21 PM      Result Value Range Status   Specimen Description BLOOD RIGHT ARM   Final   Special Requests BOTTLES DRAWN AEROBIC ONLY 6CC   Final   Culture NO GROWTH 3 DAYS   Final   Report Status PENDING   Incomplete  CULTURE, BLOOD (ROUTINE X 2)     Status: None   Collection Time    08/08/12  6:31 AM      Result Value Range Status   Specimen Description BLOOD RIGHT HAND   Final   Special Requests BOTTLES DRAWN AEROBIC AND ANAEROBIC 6CC   Final   Culture NO GROWTH 2 DAYS   Final   Report Status PENDING   Incomplete  CULTURE, BLOOD (ROUTINE X 2)     Status: None   Collection Time    08/08/12  6:38 AM      Result Value Range Status   Specimen Description BLOOD LEFT ARM   Final   Special Requests BOTTLES DRAWN AEROBIC AND ANAEROBIC 6CC   Final   Culture NO GROWTH 2 DAYS   Final   Report Status PENDING   Incomplete     Labs: Basic Metabolic Panel:  Recent Labs Lab 08/03/12 1724 08/04/12 0506 08/05/12 0733 08/06/12 0640 08/07/12 0607 08/09/12 0541 08/10/12 0641  NA 130* 134* 133* 131* 134* 135 136  K 2.7* 3.0* 3.7 3.8 3.5 3.6 4.0  CL 94* 101 103 101 103 100 101  CO2 26 25 23 22 23 30 30   GLUCOSE 272* 233* 156* 139* 136* 88 133*  BUN 7 8 12 10  9  7 6  CREATININE 0.55 0.55 0.62 0.67 0.64 0.70 0.64  CALCIUM 8.8 8.3* 8.6 8.3* 8.4 8.6 8.9  MG  --  1.4*  --   --   --   --   --    Liver Function Tests:  Recent Labs Lab 08/04/12 0506 08/05/12 0733 08/06/12 0640 08/07/12 0607  AST 193* 76* 53* 46*  ALT 357* 210* 136* 98*  ALKPHOS 285* 251* 227* 227*  BILITOT 2.5* 2.2* 1.5* 1.4*  PROT 7.1 7.0 6.9 7.6  ALBUMIN 2.5* 2.3* 2.1* 2.2*     CBC:  Recent Labs Lab 08/03/12 1724 08/04/12 0506 08/06/12 0640  08/07/12 0607 08/09/12 0541  WBC 9.6 7.9 5.3 4.8 5.8  NEUTROABS 6.7  --   --   --   --   HGB 13.6 12.4 12.2 11.6* 10.6*  HCT 37.8 35.6* 36.0 33.7* 31.0*  MCV 88.3 89.0 90.9 90.8 92.3  PLT 129* 128* 109* 110* 162   Cardiac Enzymes:  Recent Labs Lab 08/03/12 1724  TROPONINI <0.30   BNP: BNP (last 3 results)  CBG:  Recent Labs Lab 08/09/12 1019 08/09/12 1143 08/09/12 1631 08/09/12 2151 08/10/12 0748  GLUCAP 87 75 93 151* 148*       Signed:  Kalona C  [facility] 08/10/2012, 11:04 AM

## 2012-08-12 LAB — CULTURE, BLOOD (ROUTINE X 2): Culture: NO GROWTH

## 2012-08-13 LAB — CULTURE, BLOOD (ROUTINE X 2): Culture: NO GROWTH

## 2012-08-16 ENCOUNTER — Encounter: Payer: Self-pay | Admitting: *Deleted

## 2012-08-22 ENCOUNTER — Other Ambulatory Visit (HOSPITAL_COMMUNITY): Payer: Self-pay | Admitting: Pulmonary Disease

## 2012-08-22 DIAGNOSIS — R918 Other nonspecific abnormal finding of lung field: Secondary | ICD-10-CM

## 2012-08-24 ENCOUNTER — Ambulatory Visit (HOSPITAL_COMMUNITY)
Admission: RE | Admit: 2012-08-24 | Discharge: 2012-08-24 | Disposition: A | Discharge status: Home / Self Care | Payer: Medicare Other | Source: Ambulatory Visit | Attending: Pulmonary Disease | Admitting: Pulmonary Disease

## 2012-08-24 DIAGNOSIS — R911 Solitary pulmonary nodule: Secondary | ICD-10-CM | POA: Insufficient documentation

## 2012-08-24 DIAGNOSIS — R222 Localized swelling, mass and lump, trunk: Secondary | ICD-10-CM | POA: Insufficient documentation

## 2012-08-24 DIAGNOSIS — R918 Other nonspecific abnormal finding of lung field: Secondary | ICD-10-CM

## 2012-08-24 DIAGNOSIS — Z09 Encounter for follow-up examination after completed treatment for conditions other than malignant neoplasm: Secondary | ICD-10-CM | POA: Insufficient documentation

## 2012-10-06 ENCOUNTER — Other Ambulatory Visit (HOSPITAL_COMMUNITY): Payer: Self-pay

## 2012-10-06 DIAGNOSIS — R0602 Shortness of breath: Secondary | ICD-10-CM

## 2012-10-18 ENCOUNTER — Ambulatory Visit (HOSPITAL_COMMUNITY): Admission: RE | Admit: 2012-10-18 | Payer: Medicare Other | Source: Ambulatory Visit

## 2012-10-21 ENCOUNTER — Other Ambulatory Visit (HOSPITAL_COMMUNITY): Payer: Self-pay | Admitting: Pulmonary Disease

## 2012-10-21 DIAGNOSIS — B958 Unspecified staphylococcus as the cause of diseases classified elsewhere: Secondary | ICD-10-CM

## 2012-10-24 ENCOUNTER — Ambulatory Visit (HOSPITAL_COMMUNITY)
Admission: RE | Admit: 2012-10-24 | Discharge: 2012-10-24 | Disposition: A | Discharge status: Home / Self Care | Payer: Medicare Other | Source: Ambulatory Visit | Attending: Pulmonary Disease | Admitting: Pulmonary Disease

## 2012-10-24 DIAGNOSIS — B958 Unspecified staphylococcus as the cause of diseases classified elsewhere: Secondary | ICD-10-CM

## 2012-10-24 DIAGNOSIS — J9851 Mediastinitis: Secondary | ICD-10-CM | POA: Insufficient documentation

## 2012-10-24 DIAGNOSIS — R0602 Shortness of breath: Secondary | ICD-10-CM | POA: Insufficient documentation

## 2012-10-24 LAB — BASIC METABOLIC PANEL
BUN: 10 mg/dL (ref 6–23)
Chloride: 99 mEq/L (ref 96–112)
GFR calc Af Amer: 74 mL/min — ABNORMAL LOW (ref >90)
GFR calc non Af Amer: 64 mL/min — ABNORMAL LOW (ref >90)
Potassium: 3.7 mEq/L (ref 3.5–5.1)
Sodium: 134 mEq/L — ABNORMAL LOW (ref 135–145)

## 2012-10-24 MED ORDER — ALBUTEROL SULFATE (5 MG/ML) 0.5% IN NEBU
2.5000 mg | INHALATION_SOLUTION | Freq: Once | RESPIRATORY_TRACT | Status: AC
  Administered 2012-10-24: 2.5 mg via RESPIRATORY_TRACT

## 2012-10-25 ENCOUNTER — Encounter (HOSPITAL_COMMUNITY): Payer: Self-pay

## 2012-10-25 ENCOUNTER — Ambulatory Visit (HOSPITAL_COMMUNITY)
Admission: RE | Admit: 2012-10-25 | Discharge: 2012-10-25 | Disposition: A | Discharge status: Home / Self Care | Payer: Medicare Other | Source: Ambulatory Visit | Attending: Pulmonary Disease | Admitting: Pulmonary Disease

## 2012-10-25 DIAGNOSIS — B958 Unspecified staphylococcus as the cause of diseases classified elsewhere: Secondary | ICD-10-CM | POA: Insufficient documentation

## 2012-10-25 DIAGNOSIS — R599 Enlarged lymph nodes, unspecified: Secondary | ICD-10-CM | POA: Insufficient documentation

## 2012-10-25 DIAGNOSIS — J9851 Mediastinitis: Secondary | ICD-10-CM | POA: Insufficient documentation

## 2012-10-25 MED ORDER — SODIUM CHLORIDE 0.9 % IJ SOLN
INTRAMUSCULAR | Status: AC
  Filled 2012-10-25: qty 500

## 2012-10-25 MED ORDER — IOHEXOL 300 MG/ML  SOLN
80.0000 mL | Freq: Once | INTRAMUSCULAR | Status: AC | PRN
  Administered 2012-10-25: 80 mL via INTRAVENOUS

## 2012-11-09 NOTE — Procedures (Signed)
NAMELATEASHA, BREUER           ACCOUNT NO.:  [id number]  MEDICAL RECORD NO.:  [id number]  LOCATION:  RAD                           FACILITY:  [facility]  PHYSICIAN:  Saralee Bolick L. Luan Pulling, M.D.DATE OF BIRTH:  [birth year]  DATE OF PROCEDURE: DATE OF DISCHARGE:  10/25/2012                           PULMONARY FUNCTION TEST   REASON FOR PULMONARY FUNCTION TESTING:  Shortness of breath.  1. Spirometry shows no ventilatory defect and no definite airflow     obstruction. 2. Lung volumes are normal. 3. DLCO is mildly reduced. 4. Airway resistance is normal. 5. There is no significant bronchodilator improvement. 6. No definite cause of shortness of breath is seen on pulmonary     function testing.     Alba Perillo L. Luan Pulling, M.D.     ELH/MEDQ  D:  11/08/2012  T:  11/09/2012  Job:  [id number]

## 2012-11-15 LAB — PULMONARY FUNCTION TEST

## 2013-03-17 ENCOUNTER — Other Ambulatory Visit (HOSPITAL_COMMUNITY): Payer: Self-pay | Admitting: Pulmonary Disease

## 2013-03-17 DIAGNOSIS — Z09 Encounter for follow-up examination after completed treatment for conditions other than malignant neoplasm: Secondary | ICD-10-CM

## 2013-03-21 ENCOUNTER — Ambulatory Visit (HOSPITAL_COMMUNITY)
Admission: RE | Admit: 2013-03-21 | Discharge: 2013-03-21 | Disposition: A | Discharge status: Home / Self Care | Payer: Medicare Other | Source: Ambulatory Visit | Attending: Pulmonary Disease | Admitting: Pulmonary Disease

## 2013-07-05 ENCOUNTER — Other Ambulatory Visit (HOSPITAL_COMMUNITY): Payer: Self-pay | Admitting: Neurosurgery

## 2013-07-05 DIAGNOSIS — M5412 Radiculopathy, cervical region: Secondary | ICD-10-CM

## 2013-07-07 ENCOUNTER — Ambulatory Visit (HOSPITAL_COMMUNITY): Payer: Medicare Other

## 2013-07-07 ENCOUNTER — Other Ambulatory Visit (HOSPITAL_COMMUNITY): Payer: Self-pay | Admitting: Neurosurgery

## 2013-07-07 DIAGNOSIS — M5412 Radiculopathy, cervical region: Secondary | ICD-10-CM

## 2013-07-20 ENCOUNTER — Ambulatory Visit (HOSPITAL_COMMUNITY): Payer: Medicare Other

## 2013-07-24 ENCOUNTER — Ambulatory Visit (HOSPITAL_COMMUNITY)
Admission: RE | Admit: 2013-07-24 | Discharge: 2013-07-24 | Disposition: A | Discharge status: Home / Self Care | Payer: Medicare Other | Source: Ambulatory Visit | Attending: Neurosurgery | Admitting: Neurosurgery

## 2013-07-24 DIAGNOSIS — M5412 Radiculopathy, cervical region: Secondary | ICD-10-CM

## 2013-07-24 DIAGNOSIS — M47812 Spondylosis without myelopathy or radiculopathy, cervical region: Secondary | ICD-10-CM | POA: Insufficient documentation

## 2013-07-24 DIAGNOSIS — M5020 Other cervical disc displacement, unspecified cervical region: Secondary | ICD-10-CM | POA: Diagnosis not present

## 2013-07-24 DIAGNOSIS — M542 Cervicalgia: Secondary | ICD-10-CM | POA: Diagnosis present

## 2013-07-24 DIAGNOSIS — M4802 Spinal stenosis, cervical region: Secondary | ICD-10-CM | POA: Insufficient documentation

## 2013-07-24 DIAGNOSIS — M5124 Other intervertebral disc displacement, thoracic region: Secondary | ICD-10-CM | POA: Insufficient documentation

## 2013-07-24 DIAGNOSIS — Z981 Arthrodesis status: Secondary | ICD-10-CM | POA: Diagnosis not present

## 2013-07-24 DIAGNOSIS — M5380 Other specified dorsopathies, site unspecified: Secondary | ICD-10-CM | POA: Insufficient documentation

## 2013-08-25 ENCOUNTER — Other Ambulatory Visit: Payer: Self-pay | Admitting: Neurosurgery

## 2013-10-20 ENCOUNTER — Encounter (HOSPITAL_COMMUNITY): Payer: Self-pay | Admitting: Pharmacy Technician

## 2013-10-21 NOTE — Pre-Procedure Instructions (Signed)
NIASIA LANPHEAR  10/21/2013   Your procedure is scheduled on:  Wed, Oct 7 @ 8:30 AM  Report to [facility] at 6:30 AM.  Call this number if you have problems the morning of surgery: [phone]   Remember:   Do not eat food or drink liquids after midnight.   Take these medicines the morning of surgery with A SIP OF WATER: Pain Pill(if needed) and Protonix(Pantoprazole)              No Goody's,BC's,Aleve,Aspirin,Ibuprofen,Fish Oil,or any Herbal Medications   Do not wear jewelry, make-up or nail polish.  Do not wear lotions, powders, or perfumes. You may wear deodorant.  Do not shave 48 hours prior to surgery.   Do not bring valuables to the hospital.  [facility] is not responsible                  for any belongings or valuables.               Contacts, dentures or bridgework may not be worn into surgery.  Leave suitcase in the car. After surgery it may be brought to your room.  For patients admitted to the hospital, discharge time is determined by your                treatment team.               Patients discharged the day of surgery will not be allowed to drive  home.    Special Instructions:  [facility] - Preparing for Surgery  Before surgery, you can play an important role.  Because skin is not sterile, your skin needs to be as free of germs as possible.  You can reduce the number of germs on you skin by washing with CHG (chlorahexidine gluconate) soap before surgery.  CHG is an antiseptic cleaner which kills germs and bonds with the skin to continue killing germs even after washing.  Please DO NOT use if you have an allergy to CHG or antibacterial soaps.  If your skin becomes reddened/irritated stop using the CHG and inform your nurse when you arrive at Short Stay.  Do not shave (including legs and underarms) for at least 48 hours prior to the first CHG shower.  You may shave your face.  Please follow these instructions carefully:   1.  Shower with CHG Soap  the night before surgery and the                                morning of Surgery.  2.  If you choose to wash your hair, wash your hair first as usual with your       normal shampoo.  3.  After you shampoo, rinse your hair and body thoroughly to remove the                      Shampoo.  4.  Use CHG as you would any other liquid soap.  You can apply chg directly       to the skin and wash gently with scrungie or a clean washcloth.  5.  Apply the CHG Soap to your body ONLY FROM THE NECK DOWN.        Do not use on open wounds or open sores.  Avoid contact with your eyes,       ears, mouth and genitals (private  parts).  Wash genitals (private parts)       with your normal soap.  6.  Wash thoroughly, paying special attention to the area where your surgery        will be performed.  7.  Thoroughly rinse your body with warm water from the neck down.  8.  DO NOT shower/wash with your normal soap after using and rinsing off       the CHG Soap.  9.  Pat yourself dry with a clean towel.            10.  Wear clean pajamas.            11.  Place clean sheets on your bed the night of your first shower and do not        sleep with pets.  Day of Surgery  Do not apply any lotions/deoderants the morning of surgery.  Please wear clean clothes to the hospital/surgery center.     Please read over the following fact sheets that you were given: Pain Booklet, Coughing and Deep Breathing, MRSA Information and Surgical Site Infection Prevention

## 2013-10-23 ENCOUNTER — Encounter (HOSPITAL_COMMUNITY)
Admission: RE | Admit: 2013-10-23 | Discharge: 2013-10-23 | Disposition: A | Discharge status: Home / Self Care | Payer: Medicare Other | Source: Ambulatory Visit | Attending: Neurosurgery | Admitting: Neurosurgery

## 2013-10-23 ENCOUNTER — Encounter (HOSPITAL_COMMUNITY): Payer: Self-pay

## 2013-10-23 DIAGNOSIS — Z01812 Encounter for preprocedural laboratory examination: Secondary | ICD-10-CM | POA: Insufficient documentation

## 2013-10-23 DIAGNOSIS — E119 Type 2 diabetes mellitus without complications: Secondary | ICD-10-CM | POA: Diagnosis not present

## 2013-10-23 DIAGNOSIS — M5020 Other cervical disc displacement, unspecified cervical region: Secondary | ICD-10-CM | POA: Insufficient documentation

## 2013-10-23 DIAGNOSIS — Z01810 Encounter for preprocedural cardiovascular examination: Secondary | ICD-10-CM | POA: Insufficient documentation

## 2013-10-23 HISTORY — DX: Gastro-esophageal reflux disease without esophagitis: K21.9

## 2013-10-23 HISTORY — DX: Bacteremia: R78.81

## 2013-10-23 LAB — CBC
HCT: 42.2 % (ref 36.0–46.0)
Hemoglobin: 13.8 g/dL (ref 12.0–15.0)
MCH: 29.3 pg (ref 26.0–34.0)
MCHC: 32.7 g/dL (ref 30.0–36.0)
MCV: 89.6 fL (ref 78.0–100.0)
PLATELETS: 207 10*3/uL (ref 150–400)
RBC: 4.71 MIL/uL (ref 3.87–5.11)
RDW: 14.7 % (ref 11.5–15.5)
WBC: 8.4 10*3/uL (ref 4.0–10.5)

## 2013-10-23 LAB — SURGICAL PCR SCREEN
MRSA, PCR: POSITIVE — AB
STAPHYLOCOCCUS AUREUS: POSITIVE — AB

## 2013-10-23 LAB — BASIC METABOLIC PANEL
ANION GAP: 13 (ref 5–15)
BUN: 14 mg/dL (ref 6–23)
CALCIUM: 9.6 mg/dL (ref 8.4–10.5)
CO2: 22 mEq/L (ref 19–32)
Chloride: 102 mEq/L (ref 96–112)
Creatinine, Ser: 0.78 mg/dL (ref 0.50–1.10)
GFR calc Af Amer: >90 mL/min (ref >90)
Glucose, Bld: 134 mg/dL — ABNORMAL HIGH (ref 70–99)
Potassium: 4.4 mEq/L (ref 3.7–5.3)
Sodium: 137 mEq/L (ref 137–147)

## 2013-10-23 LAB — HCG, SERUM, QUALITATIVE: Preg, Serum: NEGATIVE

## 2013-10-23 NOTE — Progress Notes (Signed)
I called a prescription for Mupirocin ointment to [facility].

## 2013-11-21 ENCOUNTER — Encounter (HOSPITAL_COMMUNITY): Payer: Self-pay | Admitting: *Deleted

## 2013-11-21 MED ORDER — DEXAMETHASONE SODIUM PHOSPHATE 10 MG/ML IJ SOLN
10.0000 mg | INTRAMUSCULAR | Status: AC
  Administered 2013-11-22: 10 mg via INTRAVENOUS
  Filled 2013-11-21: qty 1

## 2013-11-21 MED ORDER — CEFAZOLIN SODIUM-DEXTROSE 2-3 GM-% IV SOLR
2.0000 g | INTRAVENOUS | Status: AC
  Administered 2013-11-22: 2 g via INTRAVENOUS
  Filled 2013-11-21: qty 50

## 2013-11-22 ENCOUNTER — Inpatient Hospital Stay (HOSPITAL_COMMUNITY): Payer: Medicare Other | Admitting: Certified Registered"

## 2013-11-22 ENCOUNTER — Inpatient Hospital Stay (HOSPITAL_COMMUNITY): Payer: Medicare Other

## 2013-11-22 ENCOUNTER — Encounter (HOSPITAL_COMMUNITY)
Admission: RE | Disposition: A | Discharge status: Home / Self Care | Payer: Self-pay | Source: Ambulatory Visit | Attending: Neurosurgery

## 2013-11-22 ENCOUNTER — Encounter (HOSPITAL_COMMUNITY): Payer: Medicare Other | Admitting: Certified Registered"

## 2013-11-22 ENCOUNTER — Ambulatory Visit (HOSPITAL_COMMUNITY)
Admission: RE | Admit: 2013-11-22 | Discharge: 2013-11-23 | Disposition: A | Discharge status: Home / Self Care | Payer: Medicare Other | Source: Ambulatory Visit | Attending: Neurosurgery | Admitting: Neurosurgery

## 2013-11-22 ENCOUNTER — Encounter (HOSPITAL_COMMUNITY): Payer: Self-pay | Admitting: *Deleted

## 2013-11-22 DIAGNOSIS — M4803 Spinal stenosis, cervicothoracic region: Secondary | ICD-10-CM | POA: Diagnosis present

## 2013-11-22 DIAGNOSIS — M5430 Sciatica, unspecified side: Secondary | ICD-10-CM | POA: Insufficient documentation

## 2013-11-22 DIAGNOSIS — Z7982 Long term (current) use of aspirin: Secondary | ICD-10-CM | POA: Diagnosis not present

## 2013-11-22 DIAGNOSIS — Z881 Allergy status to other antibiotic agents status: Secondary | ICD-10-CM | POA: Diagnosis not present

## 2013-11-22 DIAGNOSIS — K219 Gastro-esophageal reflux disease without esophagitis: Secondary | ICD-10-CM | POA: Insufficient documentation

## 2013-11-22 DIAGNOSIS — IMO0002 Reserved for concepts with insufficient information to code with codable children: Secondary | ICD-10-CM

## 2013-11-22 DIAGNOSIS — E119 Type 2 diabetes mellitus without complications: Secondary | ICD-10-CM | POA: Insufficient documentation

## 2013-11-22 DIAGNOSIS — Z886 Allergy status to analgesic agent status: Secondary | ICD-10-CM | POA: Insufficient documentation

## 2013-11-22 DIAGNOSIS — Z79899 Other long term (current) drug therapy: Secondary | ICD-10-CM | POA: Insufficient documentation

## 2013-11-22 DIAGNOSIS — F17200 Nicotine dependence, unspecified, uncomplicated: Secondary | ICD-10-CM | POA: Insufficient documentation

## 2013-11-22 DIAGNOSIS — F329 Major depressive disorder, single episode, unspecified: Secondary | ICD-10-CM | POA: Insufficient documentation

## 2013-11-22 DIAGNOSIS — M47892 Other spondylosis, cervical region: Principal | ICD-10-CM | POA: Insufficient documentation

## 2013-11-22 DIAGNOSIS — M4802 Spinal stenosis, cervical region: Secondary | ICD-10-CM | POA: Diagnosis present

## 2013-11-22 DIAGNOSIS — E282 Polycystic ovarian syndrome: Secondary | ICD-10-CM | POA: Diagnosis not present

## 2013-11-22 DIAGNOSIS — I10 Essential (primary) hypertension: Secondary | ICD-10-CM | POA: Insufficient documentation

## 2013-11-22 HISTORY — PX: POSTERIOR CERVICAL LAMINECTOMY: SHX2248

## 2013-11-22 LAB — GLUCOSE, CAPILLARY
GLUCOSE-CAPILLARY: 188 mg/dL — AB (ref 70–99)
GLUCOSE-CAPILLARY: 290 mg/dL — AB (ref 70–99)
Glucose-Capillary: 246 mg/dL — ABNORMAL HIGH (ref 70–99)

## 2013-11-22 LAB — CBC
HCT: 42.1 % (ref 36.0–46.0)
Hemoglobin: 14.3 g/dL (ref 12.0–15.0)
MCH: 29.9 pg (ref 26.0–34.0)
MCHC: 34 g/dL (ref 30.0–36.0)
MCV: 87.9 fL (ref 78.0–100.0)
Platelets: 157 10*3/uL (ref 150–400)
RBC: 4.79 MIL/uL (ref 3.87–5.11)
RDW: 15.3 % (ref 11.5–15.5)
WBC: 9.7 10*3/uL (ref 4.0–10.5)

## 2013-11-22 LAB — BASIC METABOLIC PANEL
ANION GAP: 17 — AB (ref 5–15)
BUN: 24 mg/dL — AB (ref 6–23)
CALCIUM: 9.6 mg/dL (ref 8.4–10.5)
CO2: 19 mEq/L (ref 19–32)
CREATININE: 1.18 mg/dL — AB (ref 0.50–1.10)
Chloride: 98 mEq/L (ref 96–112)
GFR calc non Af Amer: 56 mL/min — ABNORMAL LOW (ref >90)
GFR, EST AFRICAN AMERICAN: 65 mL/min — AB (ref >90)
Glucose, Bld: 172 mg/dL — ABNORMAL HIGH (ref 70–99)
Potassium: 5.3 mEq/L (ref 3.7–5.3)
Sodium: 134 mEq/L — ABNORMAL LOW (ref 137–147)

## 2013-11-22 LAB — HCG, SERUM, QUALITATIVE: Preg, Serum: NEGATIVE

## 2013-11-22 SURGERY — POSTERIOR CERVICAL LAMINECTOMY
Anesthesia: General | Site: Neck

## 2013-11-22 MED ORDER — OXYCODONE HCL 5 MG PO TABS: 5.0000 mg | ORAL_TABLET | Freq: Once | ORAL | Status: DC | PRN

## 2013-11-22 MED ORDER — CYCLOBENZAPRINE HCL 10 MG PO TABS
10.0000 mg | ORAL_TABLET | Freq: Three times a day (TID) | ORAL | Status: DC | PRN
  Filled 2013-11-22: qty 1

## 2013-11-22 MED ORDER — LACTATED RINGERS IV SOLN
INTRAVENOUS | Status: DC
  Administered 2013-11-22: 12:00:00 via INTRAVENOUS

## 2013-11-22 MED ORDER — OXYCODONE HCL 5 MG/5ML PO SOLN: 5.0000 mg | Freq: Once | ORAL | Status: DC | PRN

## 2013-11-22 MED ORDER — ROCURONIUM BROMIDE 50 MG/5ML IV SOLN
INTRAVENOUS | Status: AC
  Filled 2013-11-22: qty 1

## 2013-11-22 MED ORDER — OXYCODONE HCL 5 MG PO TABS
15.0000 mg | ORAL_TABLET | ORAL | Status: DC | PRN
  Administered 2013-11-22: 15 mg via ORAL

## 2013-11-22 MED ORDER — FENTANYL CITRATE 0.05 MG/ML IJ SOLN
INTRAMUSCULAR | Status: AC
  Filled 2013-11-22: qty 5

## 2013-11-22 MED ORDER — MIDAZOLAM HCL 5 MG/5ML IJ SOLN
INTRAMUSCULAR | Status: DC | PRN
  Administered 2013-11-22: 2 mg via INTRAVENOUS

## 2013-11-22 MED ORDER — PROPOFOL 10 MG/ML IV BOLUS
INTRAVENOUS | Status: AC
  Filled 2013-11-22: qty 20

## 2013-11-22 MED ORDER — GLYCOPYRROLATE 0.2 MG/ML IJ SOLN
INTRAMUSCULAR | Status: DC | PRN
  Administered 2013-11-22: .8 mg via INTRAVENOUS

## 2013-11-22 MED ORDER — INSULIN ASPART 100 UNIT/ML ~~LOC~~ SOLN
0.0000 [IU] | Freq: Three times a day (TID) | SUBCUTANEOUS | Status: DC
Start: 2013-11-23 — End: 2013-11-23
  Administered 2013-11-23: 4 [IU] via SUBCUTANEOUS

## 2013-11-22 MED ORDER — SODIUM CHLORIDE 0.9 % IR SOLN
Status: DC | PRN
  Administered 2013-11-22: 14:00:00

## 2013-11-22 MED ORDER — SODIUM CHLORIDE 0.9 % IJ SOLN: 3.0000 mL | INTRAMUSCULAR | Status: DC | PRN

## 2013-11-22 MED ORDER — CANAGLIFLOZIN 100 MG PO TABS
150.0000 mg | ORAL_TABLET | Freq: Every day | ORAL | Status: DC
  Administered 2013-11-23: 150 mg via ORAL
  Filled 2013-11-22 (×2): qty 1.5

## 2013-11-22 MED ORDER — LIDOCAINE HCL (CARDIAC) 20 MG/ML IV SOLN
INTRAVENOUS | Status: DC | PRN
  Administered 2013-11-22: 80 mg via INTRAVENOUS

## 2013-11-22 MED ORDER — MENTHOL 3 MG MT LOZG: 1.0000 | LOZENGE | OROMUCOSAL | Status: DC | PRN

## 2013-11-22 MED ORDER — PHENYLEPHRINE HCL 10 MG/ML IJ SOLN
INTRAMUSCULAR | Status: DC | PRN
  Administered 2013-11-22: 40 ug via INTRAVENOUS
  Administered 2013-11-22: 80 ug via INTRAVENOUS

## 2013-11-22 MED ORDER — ONDANSETRON HCL 4 MG/2ML IJ SOLN
INTRAMUSCULAR | Status: DC | PRN
  Administered 2013-11-22: 4 mg via INTRAVENOUS

## 2013-11-22 MED ORDER — HYDROMORPHONE HCL 1 MG/ML IJ SOLN
0.2500 mg | INTRAMUSCULAR | Status: DC | PRN
  Administered 2013-11-22 (×3): 0.5 mg via INTRAVENOUS

## 2013-11-22 MED ORDER — ACETAMINOPHEN 650 MG RE SUPP: 650.0000 mg | RECTAL | Status: DC | PRN

## 2013-11-22 MED ORDER — LACTATED RINGERS IV SOLN
INTRAVENOUS | Status: DC | PRN
  Administered 2013-11-22 (×2): via INTRAVENOUS

## 2013-11-22 MED ORDER — SODIUM CHLORIDE 0.9 % IJ SOLN: 3.0000 mL | Freq: Two times a day (BID) | INTRAMUSCULAR | Status: DC

## 2013-11-22 MED ORDER — FENTANYL CITRATE 0.05 MG/ML IJ SOLN
INTRAMUSCULAR | Status: DC | PRN
  Administered 2013-11-22: 50 ug via INTRAVENOUS
  Administered 2013-11-22: 250 ug via INTRAVENOUS

## 2013-11-22 MED ORDER — 0.9 % SODIUM CHLORIDE (POUR BTL) OPTIME
TOPICAL | Status: DC | PRN
  Administered 2013-11-22: 1000 mL

## 2013-11-22 MED ORDER — OXYCODONE HCL 5 MG PO TABS
ORAL_TABLET | ORAL | Status: AC
  Administered 2013-11-22: 15 mg via ORAL
  Filled 2013-11-22: qty 2

## 2013-11-22 MED ORDER — CEFAZOLIN SODIUM 1-5 GM-% IV SOLN
1.0000 g | Freq: Three times a day (TID) | INTRAVENOUS | Status: AC
  Administered 2013-11-22 – 2013-11-23 (×2): 1 g via INTRAVENOUS
  Filled 2013-11-22 (×2): qty 50

## 2013-11-22 MED ORDER — ASPIRIN-ACETAMINOPHEN-CAFFEINE 250-250-65 MG PO TABS
1.0000 | ORAL_TABLET | Freq: Four times a day (QID) | ORAL | Status: DC | PRN
  Filled 2013-11-22: qty 1

## 2013-11-22 MED ORDER — ACETAMINOPHEN 325 MG PO TABS
650.0000 mg | ORAL_TABLET | ORAL | Status: DC | PRN
  Administered 2013-11-23: 650 mg via ORAL
  Filled 2013-11-22: qty 2

## 2013-11-22 MED ORDER — PHENOL 1.4 % MT LIQD: 1.0000 | OROMUCOSAL | Status: DC | PRN

## 2013-11-22 MED ORDER — PANTOPRAZOLE SODIUM 40 MG PO TBEC
40.0000 mg | DELAYED_RELEASE_TABLET | Freq: Every day | ORAL | Status: DC
Start: 2013-11-23 — End: 2013-11-23
  Administered 2013-11-23: 40 mg via ORAL
  Filled 2013-11-22: qty 1

## 2013-11-22 MED ORDER — LIDOCAINE-EPINEPHRINE 1 %-1:100000 IJ SOLN
INTRAMUSCULAR | Status: DC | PRN
  Administered 2013-11-22: 3 mL

## 2013-11-22 MED ORDER — THROMBIN 5000 UNITS EX SOLR
CUTANEOUS | Status: DC | PRN
  Administered 2013-11-22 (×2): 5000 [IU] via TOPICAL

## 2013-11-22 MED ORDER — ONDANSETRON HCL 4 MG/2ML IJ SOLN: 4.0000 mg | INTRAMUSCULAR | Status: DC | PRN

## 2013-11-22 MED ORDER — OXYCODONE HCL 5 MG PO TABS
30.0000 mg | ORAL_TABLET | ORAL | Status: DC | PRN
  Administered 2013-11-22 – 2013-11-23 (×4): 30 mg via ORAL
  Filled 2013-11-22 (×4): qty 6

## 2013-11-22 MED ORDER — MIDAZOLAM HCL 2 MG/2ML IJ SOLN
INTRAMUSCULAR | Status: AC
  Filled 2013-11-22: qty 2

## 2013-11-22 MED ORDER — OXYCODONE HCL 5 MG PO TABS
ORAL_TABLET | ORAL | Status: AC
  Filled 2013-11-22: qty 1

## 2013-11-22 MED ORDER — HYDROMORPHONE HCL 1 MG/ML IJ SOLN
INTRAMUSCULAR | Status: AC
  Administered 2013-11-22: 0.5 mg via INTRAVENOUS
  Filled 2013-11-22: qty 1

## 2013-11-22 MED ORDER — PROPOFOL 10 MG/ML IV BOLUS
INTRAVENOUS | Status: DC | PRN
  Administered 2013-11-22: 50 mg via INTRAVENOUS
  Administered 2013-11-22: 200 mg via INTRAVENOUS

## 2013-11-22 MED ORDER — PROMETHAZINE HCL 25 MG/ML IJ SOLN: 6.2500 mg | INTRAMUSCULAR | Status: DC | PRN

## 2013-11-22 MED ORDER — NEOSTIGMINE METHYLSULFATE 10 MG/10ML IV SOLN
INTRAVENOUS | Status: DC | PRN
  Administered 2013-11-22: 4 mg via INTRAVENOUS

## 2013-11-22 MED ORDER — DOCUSATE SODIUM 100 MG PO CAPS
100.0000 mg | ORAL_CAPSULE | Freq: Two times a day (BID) | ORAL | Status: DC
  Filled 2013-11-22 (×3): qty 1

## 2013-11-22 MED ORDER — METHOCARBAMOL 1000 MG/10ML IJ SOLN: 1000.0000 mg | Freq: Four times a day (QID) | INTRAMUSCULAR | Status: DC

## 2013-11-22 MED ORDER — HYDROMORPHONE HCL 1 MG/ML IJ SOLN
INTRAMUSCULAR | Status: AC
  Filled 2013-11-22: qty 1

## 2013-11-22 MED ORDER — METHOCARBAMOL 500 MG PO TABS
500.0000 mg | ORAL_TABLET | Freq: Three times a day (TID) | ORAL | Status: DC | PRN
  Administered 2013-11-22 – 2013-11-23 (×2): 500 mg via ORAL
  Filled 2013-11-22 (×2): qty 1

## 2013-11-22 MED ORDER — HYDROMORPHONE HCL 1 MG/ML IJ SOLN: 0.5000 mg | INTRAMUSCULAR | Status: DC | PRN

## 2013-11-22 MED ORDER — SENNA 8.6 MG PO TABS
1.0000 | ORAL_TABLET | Freq: Two times a day (BID) | ORAL | Status: DC
  Administered 2013-11-22: 8.6 mg via ORAL
  Filled 2013-11-22 (×3): qty 1

## 2013-11-22 MED ORDER — HEMOSTATIC AGENTS (NO CHARGE) OPTIME
TOPICAL | Status: DC | PRN
  Administered 2013-11-22: 1 via TOPICAL

## 2013-11-22 MED ORDER — SCOPOLAMINE 1 MG/3DAYS TD PT72
MEDICATED_PATCH | TRANSDERMAL | Status: AC
  Filled 2013-11-22: qty 1

## 2013-11-22 MED ORDER — INSULIN ASPART 100 UNIT/ML ~~LOC~~ SOLN
0.0000 [IU] | Freq: Every day | SUBCUTANEOUS | Status: DC
  Administered 2013-11-22: 3 [IU] via SUBCUTANEOUS

## 2013-11-22 MED ORDER — MIDAZOLAM HCL 2 MG/2ML IJ SOLN: 0.5000 mg | Freq: Once | INTRAMUSCULAR | Status: DC | PRN

## 2013-11-22 MED ORDER — ALUM & MAG HYDROXIDE-SIMETH 200-200-20 MG/5ML PO SUSP
30.0000 mL | Freq: Four times a day (QID) | ORAL | Status: DC | PRN
Start: 2013-11-22 — End: 2013-11-23

## 2013-11-22 MED ORDER — MEPERIDINE HCL 25 MG/ML IJ SOLN: 6.2500 mg | INTRAMUSCULAR | Status: DC | PRN

## 2013-11-22 MED ORDER — BUPIVACAINE HCL (PF) 0.25 % IJ SOLN
INTRAMUSCULAR | Status: DC | PRN
  Administered 2013-11-22: 3 mL

## 2013-11-22 MED ORDER — DIPHENHYDRAMINE HCL 50 MG/ML IJ SOLN: 10.0000 mg | Freq: Once | INTRAMUSCULAR | Status: DC

## 2013-11-22 MED ORDER — ROCURONIUM BROMIDE 100 MG/10ML IV SOLN
INTRAVENOUS | Status: DC | PRN
  Administered 2013-11-22: 50 mg via INTRAVENOUS
  Administered 2013-11-22: 20 mg via INTRAVENOUS

## 2013-11-22 SURGICAL SUPPLY — 68 items
ADH SKN CLS LQ APL DERMABOND (GAUZE/BANDAGES/DRESSINGS) ×1
APL SKNCLS STERI-STRIP NONHPOA (GAUZE/BANDAGES/DRESSINGS) ×1
BAG DECANTER FOR FLEXI CONT (MISCELLANEOUS) ×3 IMPLANT
BENZOIN TINCTURE PRP APPL 2/3 (GAUZE/BANDAGES/DRESSINGS) ×4 IMPLANT
BLADE CLIPPER SURG (BLADE) ×3 IMPLANT
BLADE SURG 11 STRL SS (BLADE) ×3 IMPLANT
BUR MATCHSTICK NEURO 3.0 LAGG (BURR) ×3 IMPLANT
CANISTER SUCT 3000ML (MISCELLANEOUS) ×3 IMPLANT
CLOSURE WOUND 1/2 X4 (GAUZE/BANDAGES/DRESSINGS) ×1
CONT SPEC 4OZ CLIKSEAL STRL BL (MISCELLANEOUS) ×3 IMPLANT
DECANTER SPIKE VIAL GLASS SM (MISCELLANEOUS) ×3 IMPLANT
DERMABOND ADHESIVE PROPEN (GAUZE/BANDAGES/DRESSINGS) ×2
DERMABOND ADVANCED .7 DNX6 (GAUZE/BANDAGES/DRESSINGS) IMPLANT
DRAPE C-ARM 42X72 X-RAY (DRAPES) ×6 IMPLANT
DRAPE LAPAROTOMY 100X72 PEDS (DRAPES) ×3 IMPLANT
DRAPE MICROSCOPE LEICA (MISCELLANEOUS) IMPLANT
DRAPE POUCH INSTRU U-SHP 10X18 (DRAPES) ×3 IMPLANT
DRAPE SURG 17X23 STRL (DRAPES) ×3 IMPLANT
DRSG OPSITE 4X5.5 SM (GAUZE/BANDAGES/DRESSINGS) ×3 IMPLANT
DURAPREP 26ML APPLICATOR (WOUND CARE) ×3 IMPLANT
ELECT REM PT RETURN 9FT ADLT (ELECTROSURGICAL) ×3
ELECTRODE REM PT RTRN 9FT ADLT (ELECTROSURGICAL) ×1 IMPLANT
EVACUATOR 1/8 PVC DRAIN (DRAIN) ×2 IMPLANT
GAUZE SPONGE 4X4 12PLY STRL (GAUZE/BANDAGES/DRESSINGS) ×3 IMPLANT
GAUZE SPONGE 4X4 16PLY XRAY LF (GAUZE/BANDAGES/DRESSINGS) IMPLANT
GLOVE BIO SURGEON STRL SZ8 (GLOVE) ×3 IMPLANT
GLOVE BIOGEL PI IND STRL 7.0 (GLOVE) IMPLANT
GLOVE BIOGEL PI IND STRL 7.5 (GLOVE) IMPLANT
GLOVE BIOGEL PI INDICATOR 7.0 (GLOVE) ×2
GLOVE BIOGEL PI INDICATOR 7.5 (GLOVE) ×2
GLOVE ECLIPSE 7.5 STRL STRAW (GLOVE) ×6 IMPLANT
GLOVE EXAM NITRILE LRG STRL (GLOVE) IMPLANT
GLOVE EXAM NITRILE MD LF STRL (GLOVE) IMPLANT
GLOVE EXAM NITRILE XL STR (GLOVE) IMPLANT
GLOVE EXAM NITRILE XS STR PU (GLOVE) IMPLANT
GLOVE INDICATOR 8.5 STRL (GLOVE) ×3 IMPLANT
GLOVE OPTIFIT SS 6.5 STRL BRWN (GLOVE) ×4 IMPLANT
GOWN STRL REUS W/ TWL LRG LVL3 (GOWN DISPOSABLE) IMPLANT
GOWN STRL REUS W/ TWL XL LVL3 (GOWN DISPOSABLE) ×1 IMPLANT
GOWN STRL REUS W/TWL 2XL LVL3 (GOWN DISPOSABLE) IMPLANT
GOWN STRL REUS W/TWL LRG LVL3 (GOWN DISPOSABLE) ×3
GOWN STRL REUS W/TWL XL LVL3 (GOWN DISPOSABLE) ×9
KIT BASIN OR (CUSTOM PROCEDURE TRAY) ×3 IMPLANT
KIT ROOM TURNOVER OR (KITS) ×3 IMPLANT
LIQUID BAND (GAUZE/BANDAGES/DRESSINGS) ×3 IMPLANT
MARKER SKIN DUAL TIP RULER LAB (MISCELLANEOUS) ×3 IMPLANT
NDL SPNL 20GX3.5 QUINCKE YW (NEEDLE) ×1 IMPLANT
NEEDLE HYPO 25X1 1.5 SAFETY (NEEDLE) ×3 IMPLANT
NEEDLE SPNL 20GX3.5 QUINCKE YW (NEEDLE) ×3 IMPLANT
NS IRRIG 1000ML POUR BTL (IV SOLUTION) ×3 IMPLANT
PACK LAMINECTOMY NEURO (CUSTOM PROCEDURE TRAY) ×3 IMPLANT
PAD ARMBOARD 7.5X6 YLW CONV (MISCELLANEOUS) ×9 IMPLANT
PIN MAYFIELD SKULL DISP (PIN) ×3 IMPLANT
RUBBERBAND STERILE (MISCELLANEOUS) IMPLANT
SPONGE LAP 4X18 X RAY DECT (DISPOSABLE) IMPLANT
SPONGE SURGIFOAM ABS GEL SZ50 (HEMOSTASIS) ×3 IMPLANT
STRIP CLOSURE SKIN 1/2X4 (GAUZE/BANDAGES/DRESSINGS) ×2 IMPLANT
SUT ETHILON 4 0 PS 2 18 (SUTURE) IMPLANT
SUT VIC AB 0 CT1 18XCR BRD8 (SUTURE) ×1 IMPLANT
SUT VIC AB 0 CT1 8-18 (SUTURE) ×3
SUT VIC AB 2-0 CT1 18 (SUTURE) ×3 IMPLANT
SUT VICRYL 4-0 PS2 18IN ABS (SUTURE) ×3 IMPLANT
SYR 20ML ECCENTRIC (SYRINGE) ×3 IMPLANT
TAPE STRIPS DRAPE STRL (GAUZE/BANDAGES/DRESSINGS) ×2 IMPLANT
TOWEL OR 17X24 6PK STRL BLUE (TOWEL DISPOSABLE) ×3 IMPLANT
TOWEL OR 17X26 10 PK STRL BLUE (TOWEL DISPOSABLE) ×3 IMPLANT
TRAY FOLEY CATH 14FRSI W/METER (CATHETERS) IMPLANT
WATER STERILE IRR 1000ML POUR (IV SOLUTION) ×3 IMPLANT

## 2013-11-22 NOTE — Anesthesia Preprocedure Evaluation (Addendum)
Anesthesia Evaluation  Patient identified by MRN, date of birth, ID band Patient awake    Reviewed: Allergy & Precautions, H&P , NPO status , Patient's Chart, lab work & pertinent test results, reviewed documented beta blocker date and time   History of Anesthesia Complications Negative for: history of anesthetic complications  Airway Mallampati: II  TM Distance: >3 FB Neck ROM: Full    Dental  (+) Missing, Loose, Dental Advisory Given, Poor Dentition   Pulmonary COPDCurrent Smoker,  breath sounds clear to auscultation        Cardiovascular hypertension (borderline, no meds), Rhythm:Regular Rate:Normal  '14 ECHO: EF 65-70%, valves Ok   Neuro/Psych Depression Chronic back pain: narcotics    GI/Hepatic Neg liver ROS, GERD-  Medicated and Controlled,(+)     substance abuse  ,   Endo/Other  diabetes (glu 188), Well Controlled, Oral Hypoglycemic AgentsMorbid obesity  Renal/GU negative Renal ROS     Musculoskeletal  (+) Arthritis -,   Abdominal (+) + obese,  Abdomen: soft. Bowel sounds: normal.  Peds  Hematology negative hematology ROS (+)   Anesthesia Other Findings ECHO 08/09/12 EF 65-70%  Reproductive/Obstetrics 10/23/13 preg test: NEG                        Anesthesia Physical Anesthesia Plan  ASA: III  Anesthesia Plan: General   Post-op Pain Management:    Induction: Intravenous  Airway Management Planned: Oral ETT  Additional Equipment:   Intra-op Plan:   Post-operative Plan: Extubation in OR  Informed Consent: I have reviewed the patients History and Physical, chart, labs and discussed the procedure including the risks, benefits and alternatives for the proposed anesthesia with the patient or authorized representative who has indicated his/her understanding and acceptance.   Dental advisory given  Plan Discussed with: CRNA and Surgeon  Anesthesia Plan Comments: (Plan routine  monitors, GETA)        Anesthesia Quick Evaluation

## 2013-11-22 NOTE — Transfer of Care (Signed)
Immediate Anesthesia Transfer of Care Note  Patient: Jamie Bell  Procedure(s) Performed: Procedure(s) with comments: POSTERIOR CERVICAL LAMINECTOMY C7/T1, T/1-2 (N/A) - POSTERIOR CERVICAL LAMINECTOMY C7/T1, T/1-2  Patient Location: PACU  Anesthesia Type:General  Level of Consciousness: awake, alert  and oriented  Airway & Oxygen Therapy: Patient Spontanous Breathing and Patient connected to nasal cannula oxygen  Post-op Assessment: Report given to PACU RN, Post -op Vital signs reviewed and stable and Patient moving all extremities X 4  Post vital signs: Reviewed and stable  Complications: No apparent anesthesia complications

## 2013-11-22 NOTE — Op Note (Signed)
Preoperative diagnosis: Cervical spondylosis and stenosis at C7-T1 and T1-T2 with bilateral C8 and T1 radiculopathies   postoperative diagnosis: Same  Procedure: Posterior cervical laminectomy and foraminotomies at C7-T1 T1-1 partial medial facetectomies and foraminotomies of the C8 and T1 nerve roots bi  Surgeon: Kary Kos  Assistant: Sherley Bounds  Anesthesia: Gen.  EBL: Minimal  History of present illness: A 43 year old female with long-standing neck and low back pain who has had experienced progressive worsening numbness tingling weakness in her hands and workup revealed severe cervical spondylosis with stenosis and foraminal stenosis at C7-T1 and T1-T2. Due the patient's failure of conservative treatment imaging findings and progression of clinical syndrome I recommended decompressive cervical laminectomies and foraminotomies at C7-T1 and T1-T2. I extensively reviewed the risks and benefits of the operation with the patient as well as perioperative course expectations of outcome and alternatives of surgery and she understood and agreed to proceed forward.  Operative procedure: Patient brought into the OR was induced under general anesthesia positioned prone in pins back side of her neck was prepped and draped in routine sterile fashion after infiltration of 10 mL lidocaine with epi midline incision was made and Bovie cautery was used to calcification subperiosteal dissection was carried lamina of C6 C7 T1 and T2. Interoperative x-ray identified the C5-6 disc space attention was taken one hole interspace below this and the spinous process at C7-T1 was completely removed complete central decompression was begun there was marked stenosis predominantly at the level of the c7-T1 interspace from predominantly facet arthropathy this is all under bitten I then identified the C7 pedicle and the T1 pedicle and decompress the C8 nerve root and T1 nerve roots below both pedicles respectively. I did this  bilaterally decompressing both C8 and both T1 nerve roots. At the end of decompression was no further stenosis either centrally or foraminally so I copiously irrigated wound meticulouslywas maintained Gelfoam was laid over the dura and the muscle fascia proximate layers with interrupted Vicryl the skin is covering for subcuticular benzoin and Steri-Strips were applied and patient recovered in stable condition. At the end of the case on the account sponge counts were correct.

## 2013-11-22 NOTE — H&P (Signed)
Jamie Bell is an 43 y.o. female.   Chief Complaint: Neck pain and arm pain and numbness hand numbness and weakness HPI: Patient is a 43 year old female is a long-standing issues with her back and both her cervical and lumbar spine who presents with worsening neck pain about predominance E pain going down her arms and numbness and weakness in her hands. Workup revealed cervical spondylosis and stenosis at T1-T2 and C7-T1 with anterior calcified disks bilaterally at T1-2 and also potentially C7-T1 I recommended posterior cervical laminectomy and foraminotomies at C7-T1 and T1 to I explained to her the risks and benefits of the operation including that if I did discover to note any overt instability that we may have to fuse this but we are not currently planning on this. We have gone over the perioperative course expectations of outcome and alternatives of surgery and she understands and agrees to proceed 4.  Past Medical History  Diagnosis Date  . Diabetes mellitus   . Chronic back pain   . PCOS (polycystic ovarian syndrome)   . Depression   . DDD (degenerative disc disease)   . Decreased hearing on the right.    F/u by ENT in the past and no further management.  Marland Kitchen GERD (gastroesophageal reflux disease)   . Sciatica     HNP- Cerv. 7- T2  . MRSA bacteremia 2014    Tx with Vancomycin  . Hypertension     took med. for HTN, several yrs. ago, no longer needs , seen by Dr. Lattie Haw last yr. during a hosp. at [facility], for MRSA bacteremia     Past Surgical History  Procedure Laterality Date  . Back surgery    . Cholecystectomy    . Neck surgery    . Carpal tunnel release    . Tee without cardioversion N/A 08/09/2012    Procedure: TRANSESOPHAGEAL ECHOCARDIOGRAM (TEE);  Surgeon: [name], MD;  Location: [facility];  Service: Cardiovascular;  Laterality: N/A;  . Peripherally inserted central catheter insertion Right 2007    for treatment with Vancomycin   . Breast surgery Left    blocked milk ducts     Family History  Problem Relation Age of Onset  . Hypertension Mother   . Hypertension Father   . Diabetes Other   . Diabetes Brother    Social History:  reports that she has been smoking Cigarettes.  She has a 30 pack-year smoking history. She has never used smokeless tobacco. She reports that she uses illicit drugs (IV and Marijuana). She reports that she does not drink alcohol.  Allergies:  Allergies  Allergen Reactions  . Ciprofloxacin Swelling  . Nsaids Hives    Medications Prior to Admission  Medication Sig Dispense Refill  . acetaminophen (TYLENOL) 500 MG tablet Take 1,000 mg by mouth every 6 (six) hours as needed.      Marland Kitchen aspirin-acetaminophen-caffeine (EXCEDRIN MIGRAINE) 250-250-65 MG per tablet Take by mouth every 6 (six) hours as needed for headache.      . Canagliflozin (INVOKANA) 100 MG TABS Take 150 mg by mouth daily before breakfast.       . oxyCODONE (ROXICODONE) 15 MG immediate release tablet Take 15 mg by mouth every 4 (four) hours as needed for pain.      . pantoprazole (PROTONIX) 40 MG tablet Take 40 mg by mouth daily before breakfast.         Results for orders placed during the hospital encounter of 11/22/13 (from the past 48 hour(s))  BASIC METABOLIC PANEL     Status: Abnormal   Collection Time    11/22/13 11:45 AM      Result Value Ref Range   Sodium 134 (*) 137 - 147 mEq/L   Potassium 5.3  3.7 - 5.3 mEq/L   Comment: HEMOLYSIS AT THIS LEVEL MAY AFFECT RESULT   Chloride 98  96 - 112 mEq/L   CO2 19  19 - 32 mEq/L   Glucose, Bld 172 (*) 70 - 99 mg/dL   BUN 24 (*) 6 - 23 mg/dL   Creatinine, Ser 1.18 (*) 0.50 - 1.10 mg/dL   Calcium 9.6  8.4 - 10.5 mg/dL   GFR calc non Af Amer 56 (*) >90 mL/min   GFR calc Af Amer 65 (*) >90 mL/min   Comment: (NOTE)     The eGFR has been calculated using the CKD EPI equation.     This calculation has not been validated in all clinical situations.     eGFR's persistently <90 mL/min signify possible  Chronic Kidney     Disease.   Anion gap 17 (*) 5 - 15  CBC     Status: None   Collection Time    11/22/13 11:45 AM      Result Value Ref Range   WBC 9.7  4.0 - 10.5 K/uL   RBC 4.79  3.87 - 5.11 MIL/uL   Hemoglobin 14.3  12.0 - 15.0 g/dL   HCT 42.1  36.0 - 46.0 %   MCV 87.9  78.0 - 100.0 fL   MCH 29.9  26.0 - 34.0 pg   MCHC 34.0  30.0 - 36.0 g/dL   RDW 15.3  11.5 - 15.5 %   Platelets 157  150 - 400 K/uL  HCG, SERUM, QUALITATIVE     Status: None   Collection Time    11/22/13 11:45 AM      Result Value Ref Range   Preg, Serum NEGATIVE  NEGATIVE   Comment:            THE SENSITIVITY OF THIS     METHODOLOGY IS >10 mIU/mL.  GLUCOSE, CAPILLARY     Status: Abnormal   Collection Time    11/22/13 11:47 AM      Result Value Ref Range   Glucose-Capillary 188 (*) 70 - 99 mg/dL   No results found.  Review of Systems  Constitutional: Negative.   Eyes: Negative.   Respiratory: Negative.   Cardiovascular: Negative.   Gastrointestinal: Negative.   Genitourinary: Negative.   Musculoskeletal: Positive for joint pain and neck pain.  Skin: Negative.   Neurological: Negative.   Psychiatric/Behavioral: Negative.     Blood pressure 100/67, pulse 76, temperature 98.6 F (37 C), temperature source Oral, resp. rate 18, height 5' 5"  (1.651 m), weight 99.791 kg (220 lb), last menstrual period 09/04/2012, SpO2 100.00%. Physical Exam  Constitutional: She is oriented to person, place, and time. She appears well-developed and well-nourished.  HENT:  Head: Normocephalic.  Eyes: Pupils are equal, round, and reactive to light.  Neck: Normal range of motion.  Respiratory: Effort normal.  GI: Soft.  Neurological: She is alert and oriented to person, place, and time. She has normal strength. GCS eye subscore is 4. GCS verbal subscore is 5. GCS motor subscore is 6.  Reflex Scores:      Tricep reflexes are 2+ on the right side and 2+ on the left side.      Bicep reflexes are 2+ on the right side and  2+ on the left side.      Brachioradialis reflexes are 2+ on the right side and 2+ on the left side.      Patellar reflexes are 2+ on the right side and 2+ on the left side.      Achilles reflexes are 2+ on the right side and 2+ on the left side. Strength is 5 out of 5 in her deltoid, bicep, tricep, wrist flexion, wrist extension, hand intrinsics. Decreased sensation in both hands primarily all the distribution  Skin: Skin is warm and dry.     Assessment/Plan 43 year old presents with neck pain and bilateral arm pain numbness and tingling plan posterior cervical laminectomy and foraminotomies at C7-T1 and T1-2.  Rosaisela Jamroz P 11/22/2013, 12:44 PM

## 2013-11-22 NOTE — Progress Notes (Signed)
Orthopedic Tech Progress Note Patient Details:  Jamie Bell [birth year] [id number]  Ortho Devices Type of Ortho Device: Soft collar Ortho Device/Splint Location: neck Ortho Device/Splint Interventions: Ordered;Application   Braulio Bosch 11/22/2013, 10:18 PM

## 2013-11-22 NOTE — Plan of Care (Signed)
Problem: Consults Goal: Diagnosis - Spinal Surgery Outcome: Completed/Met Date Met:  11/22/13 Cervical Spine Fusion

## 2013-11-22 NOTE — Anesthesia Procedure Notes (Signed)
Procedure Name: Intubation Date/Time: 11/22/2013 12:56 PM Performed by: Maeola Harman Pre-anesthesia Checklist: Patient identified, Emergency Drugs available, Suction available, Patient being monitored and Timeout performed Patient Re-evaluated:Patient Re-evaluated prior to inductionOxygen Delivery Method: Circle system utilized Preoxygenation: Pre-oxygenation with 100% oxygen Intubation Type: IV induction Ventilation: Mask ventilation without difficulty Laryngoscope Size: Mac and 3 Grade View: Grade I Tube type: Oral Tube size: 7.5 mm Number of attempts: 1 Airway Equipment and Method: Stylet Placement Confirmation: ETT inserted through vocal cords under direct vision,  positive ETCO2 and breath sounds checked- equal and bilateral Secured at: 21 cm Tube secured with: Tape Dental Injury: Teeth and Oropharynx as per pre-operative assessment

## 2013-11-22 NOTE — Anesthesia Postprocedure Evaluation (Signed)
  Anesthesia Post-op Note  Patient: Jamie Bell  Procedure(s) Performed: Procedure(s) with comments: POSTERIOR CERVICAL LAMINECTOMY C7/T1, T/1-2 (N/A) - POSTERIOR CERVICAL LAMINECTOMY C7/T1, T/1-2  Patient Location: PACU  Anesthesia Type:General  Level of Consciousness: awake, alert , oriented and patient cooperative  Airway and Oxygen Therapy: Patient Spontanous Breathing and Patient connected to nasal cannula oxygen  Post-op Pain: mild  Post-op Assessment: Post-op Vital signs reviewed, Patient's Cardiovascular Status Stable, Respiratory Function Stable, Patent Airway, No signs of Nausea or vomiting and Pain level controlled  Post-op Vital Signs: Reviewed and stable  Last Vitals:  Filed Vitals:   11/22/13 1700  BP:   Pulse: 86  Temp: 36.6 C  Resp: 20    Complications: No apparent anesthesia complications

## 2013-11-23 DIAGNOSIS — M47892 Other spondylosis, cervical region: Secondary | ICD-10-CM | POA: Diagnosis not present

## 2013-11-23 LAB — GLUCOSE, CAPILLARY: Glucose-Capillary: 160 mg/dL — ABNORMAL HIGH (ref 70–99)

## 2013-11-23 MED ORDER — METHOCARBAMOL 750 MG PO TABS: 750.0000 mg | ORAL_TABLET | Freq: Four times a day (QID) | ORAL | Status: DC | PRN

## 2013-11-23 MED ORDER — OXYCODONE HCL 30 MG PO TABS: 30.0000 mg | ORAL_TABLET | ORAL | Status: DC | PRN

## 2013-11-23 NOTE — Progress Notes (Signed)
Patient ID: Jamie Bell, female   DOB: 05-30-1970, 43 y.o.   MRN: [id number] Doing well but a lot of pain slight increased numbness in her hands this morning but overall baseline  Strength 5 out of 5 wound clean dry and intact  DC Hemovac DC home

## 2013-11-23 NOTE — Discharge Instructions (Signed)
No lifting or bending or twisting or driving or riding [place] she has come back and forth to see me. Keep incision clean dry and intact.   Wound Care Keep incision covered and dry for one week.  If you shower prior to then, cover incision with plastic wrap.  You may remove outer bandage after one week and shower.  Do not put any creams, lotions, or ointments on incision. Leave steri-strips on neck.  They will fall off by themselves. Activity Walk each and every day, increasing distance each day. No lifting greater than 5 lbs.  Avoid excessive neck motion. No driving for 2 weeks; may ride as a passenger locally. Wear neck brace at all times except when showering or otherwise instructed. Diet Resume your normal diet.  Return to Work Will be discussed at you follow up appointment. Call Your Doctor If Any of These Occur Redness, drainage, or swelling at the wound.  Temperature greater than 101 degrees. Severe pain not relieved by pain medication. Increased difficulty swallowing.  Incision starts to come apart. Follow Up Appt Call today for appointment in 1-2 weeks ([phone]) or for problems.  If you have any hardware placed in your spine, you will need an x-ray before your appointment.    Laminectomy During a laminectomy, small pieces of bone in the spine called lamina are removed. The ligaments underneath the lamina and parts of the joints that have grown too large are also removed. This takes pressure off the nerves.  LET [facility] CARE PROVIDER KNOW ABOUT:  Any allergies you have.  All medicines you are taking, including vitamins, herbs, eye drops, creams, and over-the-counter medicines.  Previous problems you or members of your family have had with the use of anesthetics.  Any blood disorders you have.  Previous surgeries you have had.  Medical conditions you have. RISKS AND COMPLICATIONS  Generally, laminectomy is a safe procedure. However, as with any procedure,  complications can occur. Possible complications include:  Infection near the incision.  Nerve damage. Signs of this can be pain, weakness, or numbness.  Leaking of spinal fluid.  Blood clot in a leg. The clot can move to the lungs. This can be very serious.  Bowel or bladder incontinence (rare). BEFORE THE PROCEDURE   You will need to stop taking certain medicines as directed by your health care provider.  If you smoke, stop at least 2 weeks before the procedure. Smoking can slow down the healing process and increase the risk of complications.  Do not eat or drink anything for at least 8 hours before the procedure. Take any medicines that your health care provider tells you to keep taking with a sip of water.  Do not drink alcohol the day before your surgery.  Tell your health care provider if you develop a cold or any infection before your surgery.  Arrange for someone to drive you home after the procedure or after your hospital stay. Also arrange for someone to help you with activities during recovery. PROCEDURE  Small monitors will be placed on your body. They are used to check your heart, blood pressure, and oxygen level.  An IV tube will be inserted into one of your veins. Medicine will flow directly into your body through the IV tube.  You might be given a sedative. This will help you relax.  You will be given a medicine to make you sleep (general anesthetic), and a breathing tube will be placed into your lungs. During general anesthesia, you  are unaware of the procedure and do not feel any pain.  Your back will be cleaned with a special solution to kill germs on your skin.  Once you are asleep, the surgeon will make a 2-inch to 5-inch cut (incision) in your back. The length of the incision will depend on how many spinal bones (vertebrae) are being operated on.  Muscles in the back will be moved away from the vertebrae and pulled to the side.  Pieces of lamina will be  removed.  The ligament that lies under the lamina and connects your vertebrae will be removed.  Enough ligaments and thickened joints will be removed to take pressure off your nerves.  Your nerves will be identified, and their passage will be tracked and assessed for excessive tightness.  Your back muscles will be moved back into their normal position.  The area under your skin will be closed with small, absorbable stitches. These stitches do not need to be removed.  Your skin will be closed with small absorbable stitches or staples.  A dressing will be put over your incision.  The procedure may take 1-3 hours. AFTER THE PROCEDURE   You will stay in a recovery area until the anesthesia has worn off. Your blood pressure and pulse will be checked every so often. Then you will be taken to a hospital room.  You may continue to get fluids through the IV tube for a while.  Some pain is normal. You may be given pain medicine while still in the recovery area.  It is important to be up and moving as soon as possible after a surgery. Physical therapists will help you start walking.  To prevent blood clots in your legs:  You may be given special stockings to wear.  You may need to take medicine to prevent clots.  You may be asked to do special breathing exercises to re-expand your lungs. This is to prevent a lung infection.  Most people stay in the hospital for 1-3 days after a laminectomy. Document Released: 12/31/2008 Document Revised: 11/02/2012 Document Reviewed: 08/24/2012 [facility] Patient Information 2015 [place], [place]. This information is not intended to replace advice given to you by your health care provider. Make sure you discuss any questions you have with your health care provider.

## 2013-11-23 NOTE — Progress Notes (Signed)
Pt and mother given D/C instructions with Rx's, verbal understanding was provided. Pt's drain was D/C'd prior to D/C and dressing was changed with no sign of infection. Pt's IV was removed prior to D/C. Pt D/C'd home via wheelchair @ 1020 per MD order. Pt is stable @ D/C and has no other needs. Holli Humbles, RN

## 2013-11-23 NOTE — Discharge Summary (Signed)
  Physician Discharge Summary  Patient ID: Jamie Bell MRN: [id number] DOB/AGE: 42/29/1972 42 y.o.  Admit date: 11/22/2013 Discharge date: 11/23/2013  Admission Diagnoses: Cervical spondylosis and stenosis and foraminal stenosis C7-T1 T1-T2  Discharge Diagnoses: Same Active Problems:   Spinal stenosis in cervical region   Discharged Condition: good  Hospital Course: Patient did very well following a posterior cervical decompressive laminectomy postoperatively she was sent to recovery room and then 2 the floor she was ambulating and voiding something is having a lot of pain but it was well managed on medications and she was stable for discharge home  Consults: Significant Diagnostic Studies: Treatments: Decompressive cervical laminectomy and foraminotomies C7 through T2 Discharge Exam: Blood pressure 120/69, pulse 81, temperature 97.7 F (36.5 C), temperature source Oral, resp. rate 18, height 5' 5"  (1.651 m), weight 99.791 kg (220 lb), last menstrual period 09/04/2012, SpO2 98.00%. Strength 5 out 5 wound clean dry and intact  Disposition: Home     Medication List         acetaminophen 500 MG tablet  Commonly known as:  TYLENOL  Take 1,000 mg by mouth every 6 (six) hours as needed.     aspirin-acetaminophen-caffeine 588-325-49 MG per tablet  Commonly known as:  EXCEDRIN MIGRAINE  Take by mouth every 6 (six) hours as needed for headache.     INVOKANA 100 MG Tabs tablet  Generic drug:  canagliflozin  Take 150 mg by mouth daily before breakfast.     methocarbamol 750 MG tablet  Commonly known as:  ROBAXIN-750  Take 1 tablet (750 mg total) by mouth every 6 (six) hours as needed for muscle spasms.     oxyCODONE 15 MG immediate release tablet  Commonly known as:  ROXICODONE  Take 15 mg by mouth every 4 (four) hours as needed for pain.     oxycodone 30 MG immediate release tablet  Commonly known as:  ROXICODONE  Take 1 tablet (30 mg total) by mouth every 4  (four) hours as needed for moderate pain.     pantoprazole 40 MG tablet  Commonly known as:  PROTONIX  Take 40 mg by mouth daily before breakfast.           Follow-up Information   Follow up with [facility], MD.   Specialty:  Neurosurgery   Contact information:   [address] [place] [address] [phone]       Signed: Dannah Ryles P 11/23/2013, 7:38 AM

## 2013-11-24 ENCOUNTER — Encounter (HOSPITAL_COMMUNITY): Payer: Self-pay | Admitting: Neurosurgery

## 2014-01-26 DIAGNOSIS — J189 Pneumonia, unspecified organism: Secondary | ICD-10-CM

## 2014-01-26 HISTORY — DX: Pneumonia, unspecified organism: J18.9

## 2014-03-22 DIAGNOSIS — M542 Cervicalgia: Secondary | ICD-10-CM | POA: Diagnosis not present

## 2014-03-22 DIAGNOSIS — M5023 Other cervical disc displacement, cervicothoracic region: Secondary | ICD-10-CM | POA: Diagnosis not present

## 2014-03-22 DIAGNOSIS — M5021 Other cervical disc displacement,  high cervical region: Secondary | ICD-10-CM | POA: Diagnosis not present

## 2014-03-30 DIAGNOSIS — E119 Type 2 diabetes mellitus without complications: Secondary | ICD-10-CM | POA: Diagnosis not present

## 2014-03-30 DIAGNOSIS — I10 Essential (primary) hypertension: Secondary | ICD-10-CM | POA: Diagnosis not present

## 2014-03-30 DIAGNOSIS — M5137 Other intervertebral disc degeneration, lumbosacral region: Secondary | ICD-10-CM | POA: Diagnosis not present

## 2014-03-30 DIAGNOSIS — M4712 Other spondylosis with myelopathy, cervical region: Secondary | ICD-10-CM | POA: Diagnosis not present

## 2014-04-06 IMAGING — CT CT CHEST W/O CM
2 of 3 series · 15 of 36 positions shown, 18 images · non-contrast
Comparison: 08/05/2012

CLINICAL DATA: Follow-up lung nodule.

CT CHEST WITHOUT CONTRAST
TECHNIQUE: Multidetector CT imaging of the chest was performed
following the standard protocol without IV contrast.

[Series 2: chestroutine 5.0 b40f · axial · 0.76mm/px · z∈[-363,-98]mm · 12 of 63 slices shown, 15 images]
[im 5/63  mediastinal]
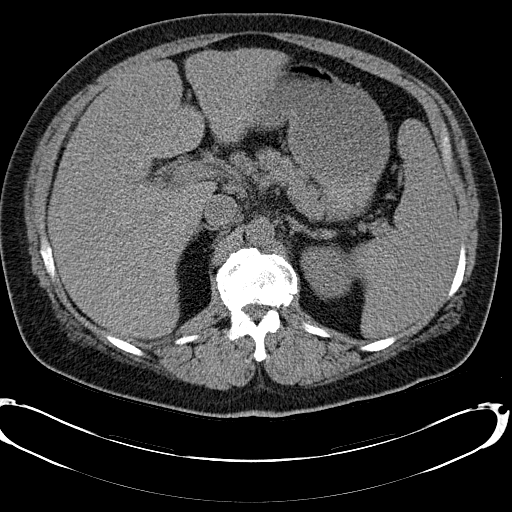
[im 5/63  lung]
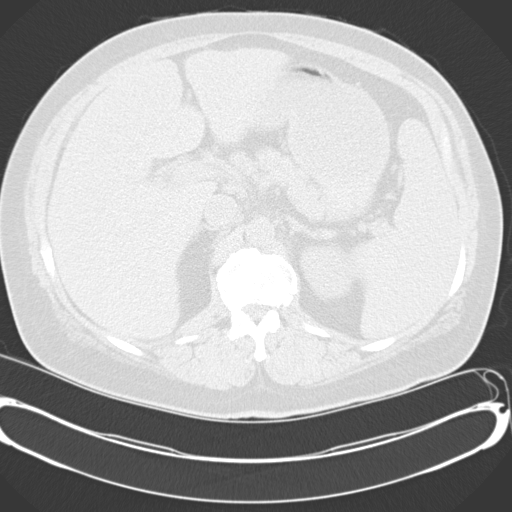
[im 10/63  lung]
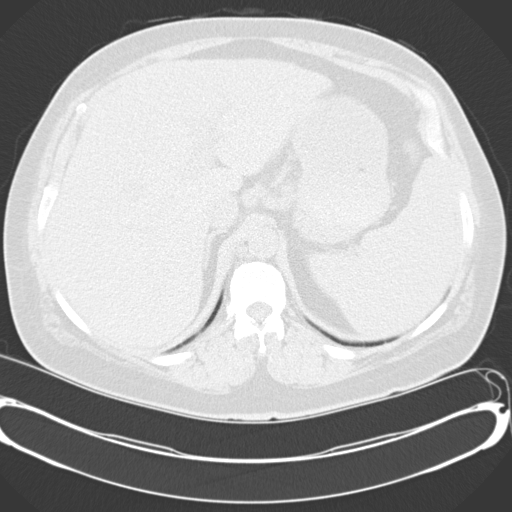
[im 14/63  lung]
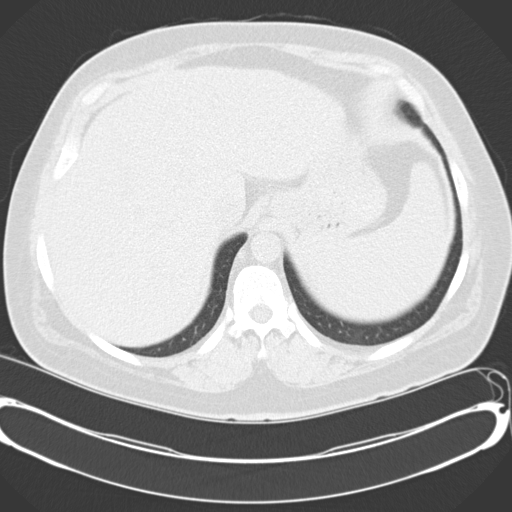
[im 19/63  lung]
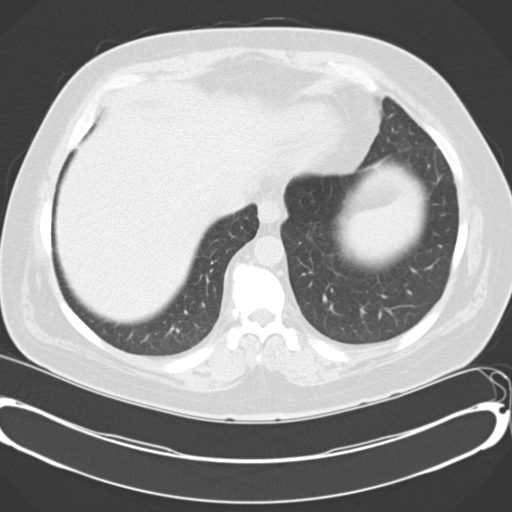
[im 23/63  mediastinal]
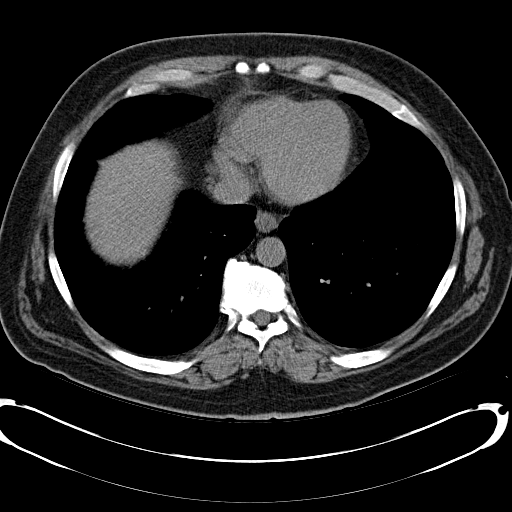
[im 23/63  lung]
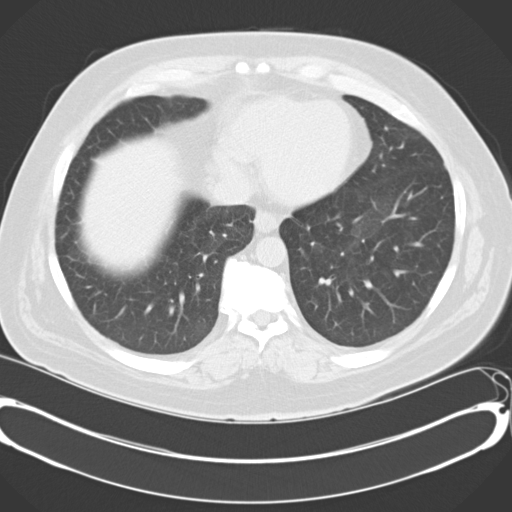
[im 28/63  lung]
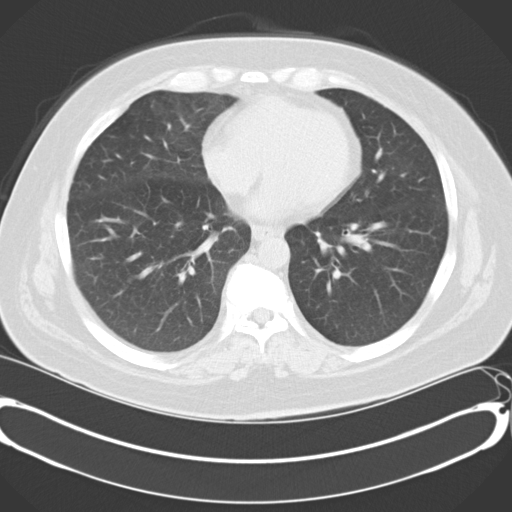
[im 35/63  lung]
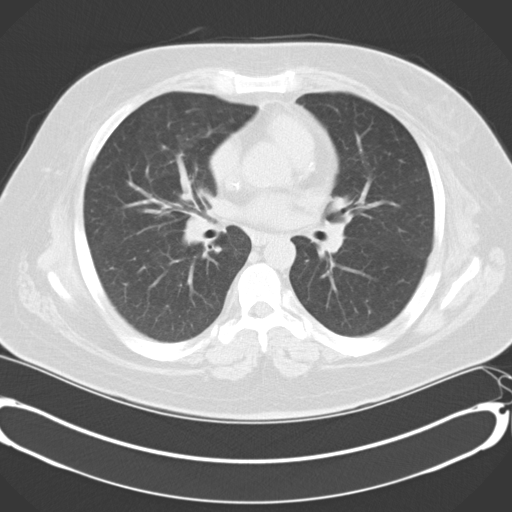
[im 40/63  lung]
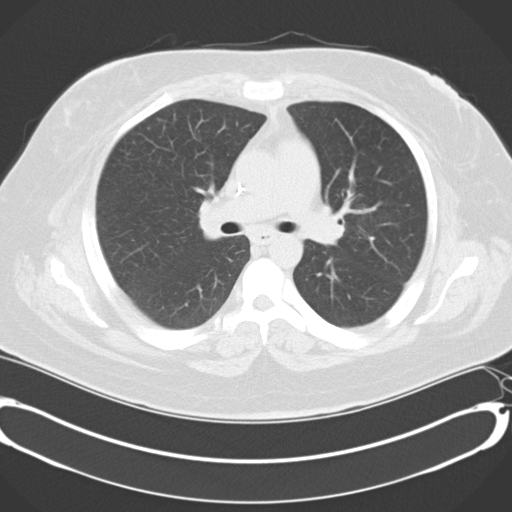
[im 44/63  mediastinal]
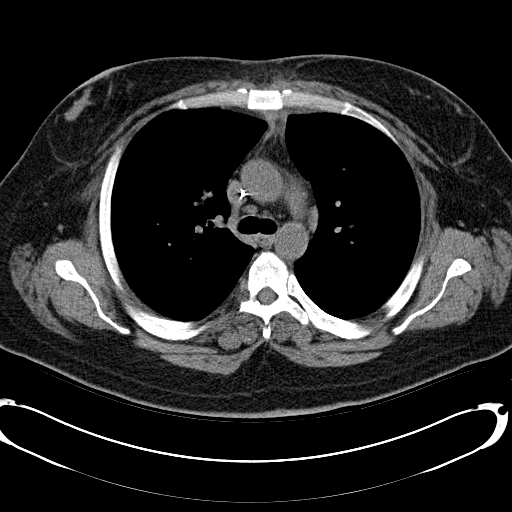
[im 44/63  lung]
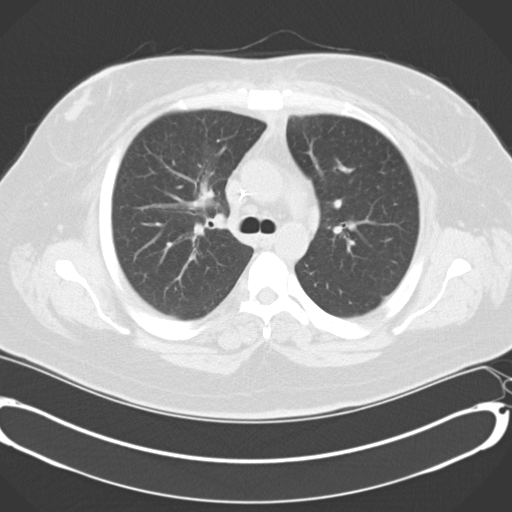
[im 49/63  lung]
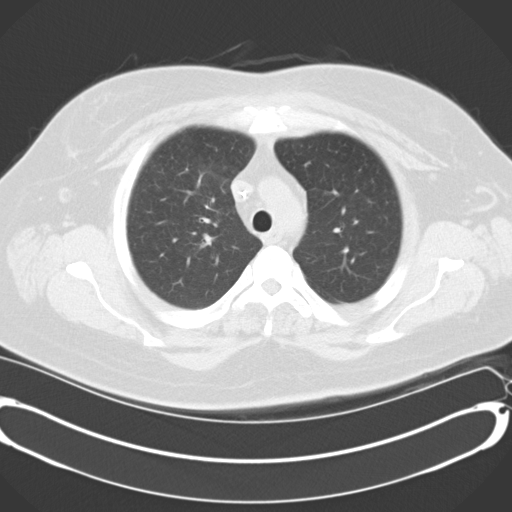
[im 53/63  lung]
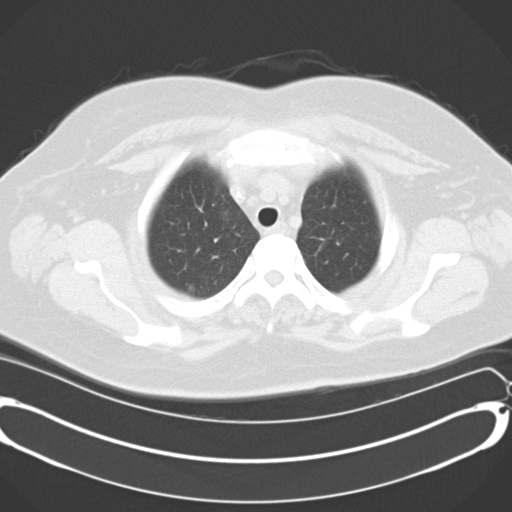
[im 58/63  lung]
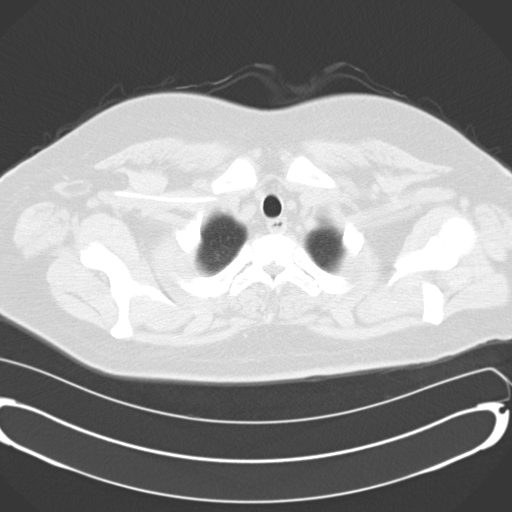

[Series 4: mpr coro 3mm · coronal · 0.61mm/px · 3 of 78 slices shown]
[im 16/78  lung]
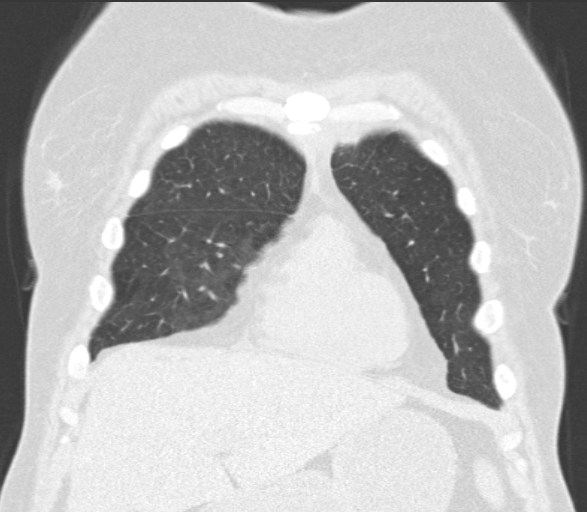
[im 31/78  lung]
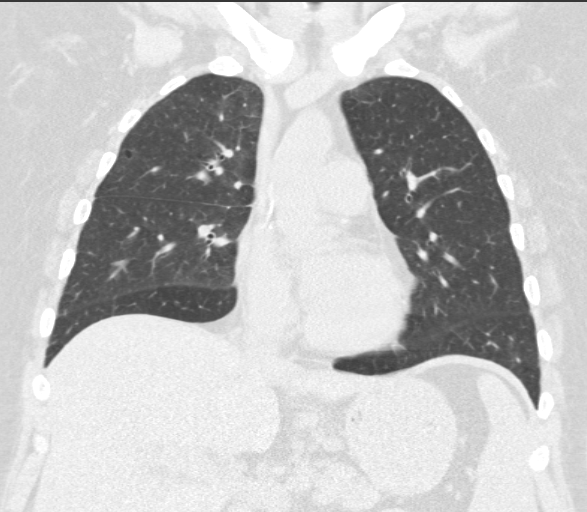
[im 47/78  lung]
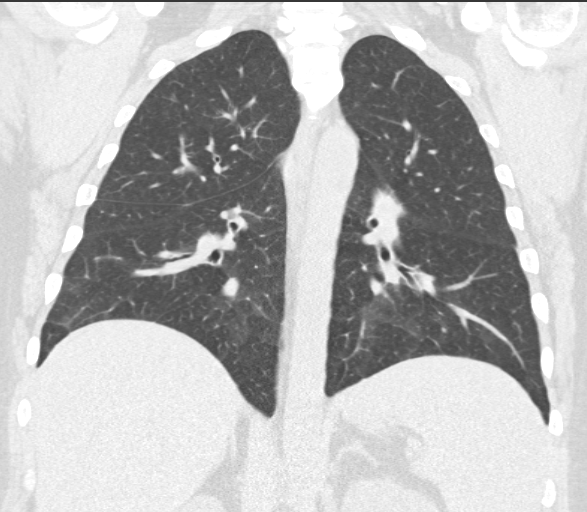

[15 of 36 positions shown; findings below may reference images not displayed]

FINDINGS: Soft tissue density in the anterior mediastinum/
substernal space is again noted.  While the fluid density appears
to have decreased, the degree of soft tissue density is similar or
slightly increased since prior study, best seen on sagittal image
61 surrounding the sternomanubrial joint.  In addition, there is a
new area of low density within the sternum at the sternomanubrial
joint.  Infection remains a distinct possibility with possible bone
destruction/osteomyelitis.

Heart is normal size. Aorta is normal caliber. No mediastinal,
hilar, or axillary adenopathy.  Lungs are clear.  No focal airspace
opacities or suspicious nodules.  No effusions.  Imaging into the
upper abdomen shows no acute findings.  Prior cholecystectomy.
IMPRESSION: Abnormal soft tissue again noted around the sternum manubrial joint
and in the anterior mediastinum.  The central fluid component
appears decreased, but the overall soft tissue abnormality is
stable or slightly increased, particularly when viewed in the
sagittal plane.  In addition, there is now a new area of low
density within the left superior sternum at the sternomanubrial
joint suggesting bone destruction.  Cannot exclude osteomyelitis.

## 2014-05-30 ENCOUNTER — Encounter (HOSPITAL_COMMUNITY): Payer: Self-pay

## 2014-05-30 ENCOUNTER — Inpatient Hospital Stay (HOSPITAL_COMMUNITY)
Admission: EM | Admit: 2014-05-30 | Discharge: 2014-06-06 | DRG: 871 | Disposition: A | Discharge status: Home / Self Care | Payer: Medicare Other | Attending: Internal Medicine | Admitting: Internal Medicine

## 2014-05-30 ENCOUNTER — Emergency Department (HOSPITAL_COMMUNITY): Payer: Medicare Other

## 2014-05-30 ENCOUNTER — Other Ambulatory Visit (HOSPITAL_COMMUNITY): Payer: Self-pay

## 2014-05-30 DIAGNOSIS — J80 Acute respiratory distress syndrome: Secondary | ICD-10-CM | POA: Diagnosis not present

## 2014-05-30 DIAGNOSIS — E876 Hypokalemia: Secondary | ICD-10-CM | POA: Diagnosis not present

## 2014-05-30 DIAGNOSIS — I248 Other forms of acute ischemic heart disease: Secondary | ICD-10-CM | POA: Diagnosis present

## 2014-05-30 DIAGNOSIS — J181 Lobar pneumonia, unspecified organism: Secondary | ICD-10-CM | POA: Diagnosis not present

## 2014-05-30 DIAGNOSIS — G8929 Other chronic pain: Secondary | ICD-10-CM | POA: Diagnosis not present

## 2014-05-30 DIAGNOSIS — Z4659 Encounter for fitting and adjustment of other gastrointestinal appliance and device: Secondary | ICD-10-CM

## 2014-05-30 DIAGNOSIS — D649 Anemia, unspecified: Secondary | ICD-10-CM | POA: Diagnosis present

## 2014-05-30 DIAGNOSIS — F411 Generalized anxiety disorder: Secondary | ICD-10-CM | POA: Diagnosis present

## 2014-05-30 DIAGNOSIS — I129 Hypertensive chronic kidney disease with stage 1 through stage 4 chronic kidney disease, or unspecified chronic kidney disease: Secondary | ICD-10-CM | POA: Diagnosis not present

## 2014-05-30 DIAGNOSIS — N179 Acute kidney failure, unspecified: Secondary | ICD-10-CM | POA: Diagnosis not present

## 2014-05-30 DIAGNOSIS — E872 Acidosis, unspecified: Secondary | ICD-10-CM

## 2014-05-30 DIAGNOSIS — H9191 Unspecified hearing loss, right ear: Secondary | ICD-10-CM | POA: Diagnosis not present

## 2014-05-30 DIAGNOSIS — K219 Gastro-esophageal reflux disease without esophagitis: Secondary | ICD-10-CM | POA: Diagnosis present

## 2014-05-30 DIAGNOSIS — E669 Obesity, unspecified: Secondary | ICD-10-CM | POA: Diagnosis not present

## 2014-05-30 DIAGNOSIS — Z9049 Acquired absence of other specified parts of digestive tract: Secondary | ICD-10-CM | POA: Diagnosis present

## 2014-05-30 DIAGNOSIS — R0602 Shortness of breath: Secondary | ICD-10-CM

## 2014-05-30 DIAGNOSIS — R0902 Hypoxemia: Secondary | ICD-10-CM | POA: Diagnosis not present

## 2014-05-30 DIAGNOSIS — M549 Dorsalgia, unspecified: Secondary | ICD-10-CM

## 2014-05-30 DIAGNOSIS — D696 Thrombocytopenia, unspecified: Secondary | ICD-10-CM | POA: Diagnosis present

## 2014-05-30 DIAGNOSIS — M5430 Sciatica, unspecified side: Secondary | ICD-10-CM | POA: Diagnosis present

## 2014-05-30 DIAGNOSIS — R6521 Severe sepsis with septic shock: Secondary | ICD-10-CM | POA: Diagnosis present

## 2014-05-30 DIAGNOSIS — Z9289 Personal history of other medical treatment: Secondary | ICD-10-CM

## 2014-05-30 DIAGNOSIS — F329 Major depressive disorder, single episode, unspecified: Secondary | ICD-10-CM | POA: Diagnosis not present

## 2014-05-30 DIAGNOSIS — L03113 Cellulitis of right upper limb: Secondary | ICD-10-CM | POA: Diagnosis present

## 2014-05-30 DIAGNOSIS — A419 Sepsis, unspecified organism: Secondary | ICD-10-CM | POA: Diagnosis not present

## 2014-05-30 DIAGNOSIS — E1122 Type 2 diabetes mellitus with diabetic chronic kidney disease: Secondary | ICD-10-CM | POA: Diagnosis not present

## 2014-05-30 DIAGNOSIS — F419 Anxiety disorder, unspecified: Secondary | ICD-10-CM | POA: Diagnosis not present

## 2014-05-30 DIAGNOSIS — J9690 Respiratory failure, unspecified, unspecified whether with hypoxia or hypercapnia: Secondary | ICD-10-CM

## 2014-05-30 DIAGNOSIS — J189 Pneumonia, unspecified organism: Secondary | ICD-10-CM | POA: Diagnosis not present

## 2014-05-30 DIAGNOSIS — Z881 Allergy status to other antibiotic agents status: Secondary | ICD-10-CM

## 2014-05-30 DIAGNOSIS — E1121 Type 2 diabetes mellitus with diabetic nephropathy: Secondary | ICD-10-CM | POA: Diagnosis not present

## 2014-05-30 DIAGNOSIS — Z720 Tobacco use: Secondary | ICD-10-CM | POA: Diagnosis present

## 2014-05-30 DIAGNOSIS — Z6835 Body mass index (BMI) 35.0-35.9, adult: Secondary | ICD-10-CM

## 2014-05-30 DIAGNOSIS — F17210 Nicotine dependence, cigarettes, uncomplicated: Secondary | ICD-10-CM | POA: Diagnosis present

## 2014-05-30 DIAGNOSIS — E282 Polycystic ovarian syndrome: Secondary | ICD-10-CM | POA: Diagnosis not present

## 2014-05-30 DIAGNOSIS — N19 Unspecified kidney failure: Secondary | ICD-10-CM

## 2014-05-30 DIAGNOSIS — R6520 Severe sepsis without septic shock: Secondary | ICD-10-CM

## 2014-05-30 DIAGNOSIS — J9601 Acute respiratory failure with hypoxia: Secondary | ICD-10-CM | POA: Diagnosis not present

## 2014-05-30 DIAGNOSIS — F32A Depression, unspecified: Secondary | ICD-10-CM | POA: Diagnosis present

## 2014-05-30 DIAGNOSIS — N182 Chronic kidney disease, stage 2 (mild): Secondary | ICD-10-CM | POA: Diagnosis present

## 2014-05-30 DIAGNOSIS — J9600 Acute respiratory failure, unspecified whether with hypoxia or hypercapnia: Secondary | ICD-10-CM | POA: Diagnosis present

## 2014-05-30 MED ORDER — ALBUTEROL (5 MG/ML) CONTINUOUS INHALATION SOLN
INHALATION_SOLUTION | RESPIRATORY_TRACT | Status: AC
  Administered 2014-05-31
  Filled 2014-05-30: qty 20

## 2014-05-30 MED ORDER — ALBUTEROL SULFATE (2.5 MG/3ML) 0.083% IN NEBU: 5.0000 mg | INHALATION_SOLUTION | Freq: Once | RESPIRATORY_TRACT | Status: DC

## 2014-05-30 MED ORDER — IPRATROPIUM BROMIDE 0.02 % IN SOLN: 0.5000 mg | Freq: Once | RESPIRATORY_TRACT | Status: DC

## 2014-05-30 NOTE — ED Notes (Signed)
Felt like I was having a panic attack this morning and I have not been breathing well since per pt.  Patient states that she has taken an oxycodone today by mouth.

## 2014-05-30 NOTE — ED Provider Notes (Signed)
CSN: [id number]     Arrival date & time 05/30/14  2256 History   First MD Initiated Contact with Patient 05/30/14 2334    This chart was scribed for Veryl Speak, MD by Terressa Koyanagi, ED Scribe. This patient was seen in room [facility] and the patient's care was started at 11:45 PM.  Chief Complaint  Patient presents with  . Shortness of Breath   The history is provided by the patient. No language interpreter was used.   PCP: Alonza Bogus, MD HPI Comments: Jamie Bell is a 44 y.o. female, with PMH noted below including DM and daily tobacco use (1.5 ppd), who presents to the Emergency Department complaining of SOB onset this morning around 9:30AM following a panic attack. Pt denies any Hx of similar Sx; cough; Hx of COPD; Hx of heart issues including CHF; daily meds; swelling of LE; abd pain.   Past Medical History  Diagnosis Date  . Diabetes mellitus   . Chronic back pain   . PCOS (polycystic ovarian syndrome)   . Depression   . DDD (degenerative disc disease)   . Decreased hearing on the right.    F/u by ENT in the past and no further management.  Marland Kitchen GERD (gastroesophageal reflux disease)   . Sciatica     HNP- Cerv. 7- T2  . MRSA bacteremia 2014    Tx with Vancomycin  . Hypertension     took med. for HTN, several yrs. ago, no longer needs , seen by Dr. Lattie Haw last yr. during a hosp. at [facility], for MRSA bacteremia    Past Surgical History  Procedure Laterality Date  . Back surgery    . Cholecystectomy    . Neck surgery    . Carpal tunnel release    . Tee without cardioversion N/A 08/09/2012    Procedure: TRANSESOPHAGEAL ECHOCARDIOGRAM (TEE);  Surgeon: [name], MD;  Location: [facility];  Service: Cardiovascular;  Laterality: N/A;  . Peripherally inserted central catheter insertion Right 2007    for treatment with Vancomycin   . Breast surgery Left     blocked milk ducts   . Posterior cervical laminectomy N/A 11/22/2013    Procedure: POSTERIOR CERVICAL  LAMINECTOMY C7/T1, T/1-2;  Surgeon: [name], MD;  Location: [place] [facility];  Service: Neurosurgery;  Laterality: N/A;  POSTERIOR CERVICAL LAMINECTOMY C7/T1, T/1-2   Family History  Problem Relation Age of Onset  . Hypertension Mother   . Hypertension Father   . Diabetes Other   . Diabetes Brother    History  Substance Use Topics  . Smoking status: Current Every Day Smoker -- 1.50 packs/day for 20 years    Types: Cigarettes  . Smokeless tobacco: Never Used     Comment: pain pills  . Alcohol Use: No   OB History    Gravida Para Term Preterm AB TAB SAB Ectopic Multiple Living            0     Review of Systems A complete 10 system review of systems was obtained and all systems are negative except as noted in the HPI and PMH.   Allergies  Ciprofloxacin and Nsaids  Home Medications   Prior to Admission medications   Medication Sig Start Date End Date Taking? Authorizing Provider  acetaminophen (TYLENOL) 500 MG tablet Take 1,000 mg by mouth every 6 (six) hours as needed.    Historical Provider, MD  aspirin-acetaminophen-caffeine (EXCEDRIN MIGRAINE) [phone] MG per tablet Take by mouth every 6 (  six) hours as needed for headache.    Historical Provider, MD  Canagliflozin (INVOKANA) 100 MG TABS Take 150 mg by mouth daily before breakfast.     Historical Provider, MD  methocarbamol (ROBAXIN-750) 750 MG tablet Take 1 tablet (750 mg total) by mouth every 6 (six) hours as needed for muscle spasms. 11/23/13   Kary Kos, MD  oxyCODONE (ROXICODONE) 15 MG immediate release tablet Take 15 mg by mouth every 4 (four) hours as needed for pain.    Historical Provider, MD  oxyCODONE (ROXICODONE) 30 MG immediate release tablet Take 1 tablet (30 mg total) by mouth every 4 (four) hours as needed for moderate pain. 11/23/13   Kary Kos, MD  pantoprazole (PROTONIX) 40 MG tablet Take 40 mg by mouth daily before breakfast.     Historical Provider, MD   Triage Vitals: BP 99/67 mmHg  Pulse 134  Resp 28   SpO2 94%  LMP 05/25/2014 Physical Exam  Constitutional: She is oriented to person, place, and time. She appears well-developed and well-nourished.  HENT:  Head: Normocephalic and atraumatic.  Eyes: Conjunctivae and EOM are normal.  Neck: Neck supple.  Cardiovascular: Regular rhythm.  Tachycardia present.   Pulmonary/Chest: She is in respiratory distress (moderate).  Difficulty finishing sentences   Musculoskeletal: Normal range of motion.  Neurological: She is alert and oriented to person, place, and time.  Skin: Skin is warm and dry.  Psychiatric: She has a normal mood and affect. Her behavior is normal.  Nursing note and vitals reviewed.   ED Course  Procedures (including critical care time) DIAGNOSTIC STUDIES: Oxygen Saturation is 94% on non rebreather (15L/min).    COORDINATION OF CARE: 11:54 PM-Discussed treatment plan which includes breathing Tx with pt at bedside and pt agreed to plan.   Labs Review Labs Reviewed  COMPREHENSIVE METABOLIC PANEL  CBC WITH DIFFERENTIAL/PLATELET  TROPONIN I  BRAIN NATRIURETIC PEPTIDE  BLOOD GAS, ARTERIAL    Imaging Review No results found.  ED ECG REPORT   Date: 05/31/2014  Rate: 130  Rhythm: sinus tachycardia  QRS Axis: normal  Intervals: normal  ST/T Wave abnormalities: normal  Conduction Disutrbances:none  Narrative Interpretation:   Old EKG Reviewed: none available  I have personally reviewed the EKG tracing and agree with the computerized printout as noted.   MDM   Final diagnoses:  SOB (shortness of breath)    Patient is a 44 year old female with history of diabetes. She presents for evaluation of difficulty breathing that started this afternoon. Her workup reveals an elevated white count of 24,000, bilateral infiltrates on her chest x-ray, mild elevation of her troponin, and significant hypoxia on her ABG. Upon arrival to the ER, she was hypoxic and in moderate respiratory distress.  She continues in moderate  respiratory distress despite Lasix and breathing treatments. This appears to be a pneumonia. Due to the elevation of white count and impending admission to the ICU, blood cultures were obtained. She was also treated presumptively for community-acquired pneumonia with Rocephin and Zithromax.  I've spoken with Dr. Alva Garnet from critical care who agrees to accept the patient in transfer. She will be transferred to the ICU at [facility] for further workup and treatment.  CRITICAL CARE Performed by: Veryl Speak Total critical care time: 60 minutes Critical care time was exclusive of separately billable procedures and treating other patients. Critical care was necessary to treat or prevent imminent or life-threatening deterioration. Critical care was time spent personally by me on the following activities: development of treatment  plan with patient and/or surrogate as well as nursing, discussions with consultants, evaluation of patient's response to treatment, examination of patient, obtaining history from patient or surrogate, ordering and performing treatments and interventions, ordering and review of laboratory studies, ordering and review of radiographic studies, pulse oximetry and re-evaluation of patient's condition.   I personally performed the services described in this documentation, which was scribed in my presence. The recorded information has been reviewed and is accurate.      Veryl Speak, MD 05/31/14 [phone]

## 2014-05-31 ENCOUNTER — Inpatient Hospital Stay (HOSPITAL_COMMUNITY): Payer: Medicare Other

## 2014-05-31 DIAGNOSIS — Z9049 Acquired absence of other specified parts of digestive tract: Secondary | ICD-10-CM | POA: Diagnosis not present

## 2014-05-31 DIAGNOSIS — J9600 Acute respiratory failure, unspecified whether with hypoxia or hypercapnia: Secondary | ICD-10-CM | POA: Diagnosis present

## 2014-05-31 DIAGNOSIS — R6521 Severe sepsis with septic shock: Secondary | ICD-10-CM | POA: Diagnosis not present

## 2014-05-31 DIAGNOSIS — G8929 Other chronic pain: Secondary | ICD-10-CM | POA: Diagnosis not present

## 2014-05-31 DIAGNOSIS — I129 Hypertensive chronic kidney disease with stage 1 through stage 4 chronic kidney disease, or unspecified chronic kidney disease: Secondary | ICD-10-CM | POA: Diagnosis not present

## 2014-05-31 DIAGNOSIS — Z881 Allergy status to other antibiotic agents status: Secondary | ICD-10-CM | POA: Diagnosis not present

## 2014-05-31 DIAGNOSIS — R7881 Bacteremia: Secondary | ICD-10-CM | POA: Diagnosis not present

## 2014-05-31 DIAGNOSIS — Z6835 Body mass index (BMI) 35.0-35.9, adult: Secondary | ICD-10-CM | POA: Diagnosis not present

## 2014-05-31 DIAGNOSIS — E1121 Type 2 diabetes mellitus with diabetic nephropathy: Secondary | ICD-10-CM | POA: Diagnosis not present

## 2014-05-31 DIAGNOSIS — F17210 Nicotine dependence, cigarettes, uncomplicated: Secondary | ICD-10-CM | POA: Diagnosis not present

## 2014-05-31 DIAGNOSIS — H9191 Unspecified hearing loss, right ear: Secondary | ICD-10-CM | POA: Diagnosis not present

## 2014-05-31 DIAGNOSIS — Z4682 Encounter for fitting and adjustment of non-vascular catheter: Secondary | ICD-10-CM | POA: Diagnosis not present

## 2014-05-31 DIAGNOSIS — R0902 Hypoxemia: Secondary | ICD-10-CM

## 2014-05-31 DIAGNOSIS — L03113 Cellulitis of right upper limb: Secondary | ICD-10-CM | POA: Diagnosis not present

## 2014-05-31 DIAGNOSIS — F411 Generalized anxiety disorder: Secondary | ICD-10-CM | POA: Diagnosis not present

## 2014-05-31 DIAGNOSIS — R918 Other nonspecific abnormal finding of lung field: Secondary | ICD-10-CM | POA: Diagnosis not present

## 2014-05-31 DIAGNOSIS — R0600 Dyspnea, unspecified: Secondary | ICD-10-CM | POA: Diagnosis not present

## 2014-05-31 DIAGNOSIS — E876 Hypokalemia: Secondary | ICD-10-CM | POA: Diagnosis not present

## 2014-05-31 DIAGNOSIS — E1122 Type 2 diabetes mellitus with diabetic chronic kidney disease: Secondary | ICD-10-CM | POA: Diagnosis not present

## 2014-05-31 DIAGNOSIS — M5430 Sciatica, unspecified side: Secondary | ICD-10-CM | POA: Diagnosis not present

## 2014-05-31 DIAGNOSIS — N182 Chronic kidney disease, stage 2 (mild): Secondary | ICD-10-CM | POA: Diagnosis not present

## 2014-05-31 DIAGNOSIS — K219 Gastro-esophageal reflux disease without esophagitis: Secondary | ICD-10-CM | POA: Diagnosis not present

## 2014-05-31 DIAGNOSIS — E669 Obesity, unspecified: Secondary | ICD-10-CM | POA: Diagnosis not present

## 2014-05-31 DIAGNOSIS — D696 Thrombocytopenia, unspecified: Secondary | ICD-10-CM | POA: Diagnosis not present

## 2014-05-31 DIAGNOSIS — J9690 Respiratory failure, unspecified, unspecified whether with hypoxia or hypercapnia: Secondary | ICD-10-CM | POA: Diagnosis not present

## 2014-05-31 DIAGNOSIS — J189 Pneumonia, unspecified organism: Secondary | ICD-10-CM | POA: Diagnosis not present

## 2014-05-31 DIAGNOSIS — J80 Acute respiratory distress syndrome: Secondary | ICD-10-CM | POA: Diagnosis not present

## 2014-05-31 DIAGNOSIS — J9601 Acute respiratory failure with hypoxia: Secondary | ICD-10-CM

## 2014-05-31 DIAGNOSIS — N179 Acute kidney failure, unspecified: Secondary | ICD-10-CM | POA: Diagnosis not present

## 2014-05-31 DIAGNOSIS — I248 Other forms of acute ischemic heart disease: Secondary | ICD-10-CM | POA: Diagnosis not present

## 2014-05-31 DIAGNOSIS — R0602 Shortness of breath: Secondary | ICD-10-CM | POA: Diagnosis not present

## 2014-05-31 DIAGNOSIS — D649 Anemia, unspecified: Secondary | ICD-10-CM | POA: Diagnosis not present

## 2014-05-31 DIAGNOSIS — E282 Polycystic ovarian syndrome: Secondary | ICD-10-CM | POA: Diagnosis not present

## 2014-05-31 DIAGNOSIS — F329 Major depressive disorder, single episode, unspecified: Secondary | ICD-10-CM | POA: Diagnosis not present

## 2014-05-31 DIAGNOSIS — A419 Sepsis, unspecified organism: Secondary | ICD-10-CM | POA: Diagnosis not present

## 2014-05-31 LAB — URINE MICROSCOPIC-ADD ON

## 2014-05-31 LAB — BASIC METABOLIC PANEL
Anion gap: 13 (ref 5–15)
BUN: 38 mg/dL — AB (ref 6–20)
CHLORIDE: 103 mmol/L (ref 101–111)
CO2: 18 mmol/L — AB (ref 22–32)
Calcium: 8.7 mg/dL — ABNORMAL LOW (ref 8.9–10.3)
Creatinine, Ser: 2.12 mg/dL — ABNORMAL HIGH (ref 0.44–1.00)
GFR calc Af Amer: 32 mL/min — ABNORMAL LOW (ref >60)
GFR calc non Af Amer: 28 mL/min — ABNORMAL LOW (ref >60)
Glucose, Bld: 269 mg/dL — ABNORMAL HIGH (ref 70–99)
Potassium: 4.1 mmol/L (ref 3.5–5.1)
Sodium: 134 mmol/L — ABNORMAL LOW (ref 135–145)

## 2014-05-31 LAB — URINALYSIS, ROUTINE W REFLEX MICROSCOPIC
Bilirubin Urine: NEGATIVE
Glucose, UA: >1000 mg/dL — AB
Ketones, ur: NEGATIVE mg/dL
Leukocytes, UA: NEGATIVE
Nitrite: NEGATIVE
Protein, ur: 30 mg/dL — AB
Specific Gravity, Urine: 1.03 (ref 1.005–1.030)
Urobilinogen, UA: 1 mg/dL (ref 0.0–1.0)
pH: 5 (ref 5.0–8.0)

## 2014-05-31 LAB — BLOOD GAS, ARTERIAL
ACID-BASE DEFICIT: 11 mmol/L — AB (ref 0.0–2.0)
BICARBONATE: 14.4 meq/L — AB (ref 20.0–24.0)
Drawn by: 22223
O2 Content: 6 L/min
O2 SAT: 81.1 %
PH ART: 7.287 — AB (ref 7.350–7.450)
Patient temperature: 37
TCO2: 13.2 mmol/L (ref 0–100)
pCO2 arterial: 31.1 mmHg — ABNORMAL LOW (ref 35.0–45.0)
pO2, Arterial: 52.5 mmHg — ABNORMAL LOW (ref 80.0–100.0)

## 2014-05-31 LAB — GLUCOSE, CAPILLARY
Glucose-Capillary: 287 mg/dL — ABNORMAL HIGH (ref 70–99)
Glucose-Capillary: 191 mg/dL — ABNORMAL HIGH (ref 70–99)
Glucose-Capillary: 201 mg/dL — ABNORMAL HIGH (ref 70–99)
Glucose-Capillary: 123 mg/dL — ABNORMAL HIGH (ref 70–99)

## 2014-05-31 LAB — RAPID URINE DRUG SCREEN, HOSP PERFORMED
Amphetamines: NOT DETECTED
Barbiturates: NOT DETECTED
Benzodiazepines: NOT DETECTED
Cocaine: NOT DETECTED
Opiates: POSITIVE — AB
Tetrahydrocannabinol: NOT DETECTED

## 2014-05-31 LAB — POCT I-STAT 3, ART BLOOD GAS (G3+)
Acid-base deficit: 7 mmol/L — ABNORMAL HIGH (ref 0.0–2.0)
Acid-base deficit: 6 mmol/L — ABNORMAL HIGH (ref 0.0–2.0)
Bicarbonate: 23.7 mEq/L (ref 20.0–24.0)
Bicarbonate: 21.4 mEq/L (ref 20.0–24.0)
O2 SAT: 89 %
O2 Saturation: 90 %
TCO2: 26 mmol/L (ref 0–100)
TCO2: 23 mmol/L (ref 0–100)
pCO2 arterial: 73.9 mmHg (ref 35.0–45.0)
pCO2 arterial: 46.8 mmHg — ABNORMAL HIGH (ref 35.0–45.0)
pH, Arterial: 7.115 — CL (ref 7.350–7.450)
pH, Arterial: 7.268 — ABNORMAL LOW (ref 7.350–7.450)
pO2, Arterial: 80 mmHg (ref 80.0–100.0)
pO2, Arterial: 65 mmHg — ABNORMAL LOW (ref 80.0–100.0)

## 2014-05-31 LAB — TROPONIN I
Troponin I: 0.14 ng/mL — ABNORMAL HIGH (ref <0.031)
Troponin I: 0.17 ng/mL — ABNORMAL HIGH (ref <0.031)

## 2014-05-31 LAB — CBC
HEMATOCRIT: 36.8 % (ref 36.0–46.0)
Hemoglobin: 12.5 g/dL (ref 12.0–15.0)
MCH: 28.5 pg (ref 26.0–34.0)
MCHC: 34 g/dL (ref 30.0–36.0)
MCV: 84 fL (ref 78.0–100.0)
PLATELETS: 146 10*3/uL — AB (ref 150–400)
RBC: 4.38 MIL/uL (ref 3.87–5.11)
RDW: 16.2 % — AB (ref 11.5–15.5)
WBC: 16.2 10*3/uL — ABNORMAL HIGH (ref 4.0–10.5)

## 2014-05-31 LAB — COMPREHENSIVE METABOLIC PANEL
ALT: 49 U/L (ref 14–54)
AST: 130 U/L — ABNORMAL HIGH (ref 15–41)
Albumin: 4 g/dL (ref 3.5–5.0)
Alkaline Phosphatase: 142 U/L — ABNORMAL HIGH (ref 38–126)
Anion gap: 17 — ABNORMAL HIGH (ref 5–15)
BUN: 42 mg/dL — AB (ref 6–20)
CO2: 14 mmol/L — ABNORMAL LOW (ref 22–32)
Calcium: 8.8 mg/dL — ABNORMAL LOW (ref 8.9–10.3)
Chloride: 102 mmol/L (ref 101–111)
Creatinine, Ser: 2.46 mg/dL — ABNORMAL HIGH (ref 0.44–1.00)
GFR calc Af Amer: 27 mL/min — ABNORMAL LOW (ref >60)
GFR, EST NON AFRICAN AMERICAN: 23 mL/min — AB (ref >60)
GLUCOSE: 249 mg/dL — AB (ref 70–99)
POTASSIUM: 4.8 mmol/L (ref 3.5–5.1)
Sodium: 133 mmol/L — ABNORMAL LOW (ref 135–145)
Total Bilirubin: 0.6 mg/dL (ref 0.3–1.2)
Total Protein: 8.4 g/dL — ABNORMAL HIGH (ref 6.5–8.1)

## 2014-05-31 LAB — PROCALCITONIN: Procalcitonin: 11.15 ng/mL

## 2014-05-31 LAB — CBC WITH DIFFERENTIAL/PLATELET
BASOS ABS: 0 10*3/uL (ref 0.0–0.1)
BASOS PCT: 0 % (ref 0–1)
EOS ABS: 0 10*3/uL (ref 0.0–0.7)
Eosinophils Relative: 0 % (ref 0–5)
HCT: 39.9 % (ref 36.0–46.0)
HEMOGLOBIN: 13.4 g/dL (ref 12.0–15.0)
Lymphocytes Relative: 4 % — ABNORMAL LOW (ref 12–46)
Lymphs Abs: 0.8 10*3/uL (ref 0.7–4.0)
MCH: 29 pg (ref 26.0–34.0)
MCHC: 33.6 g/dL (ref 30.0–36.0)
MCV: 86.4 fL (ref 78.0–100.0)
MONOS PCT: 3 % (ref 3–12)
Monocytes Absolute: 0.7 10*3/uL (ref 0.1–1.0)
NEUTROS ABS: 22.7 10*3/uL — AB (ref 1.7–7.7)
NEUTROS PCT: 93 % — AB (ref 43–77)
PLATELETS: 183 10*3/uL (ref 150–400)
RBC: 4.62 MIL/uL (ref 3.87–5.11)
RDW: 16.5 % — ABNORMAL HIGH (ref 11.5–15.5)
WBC: 24.3 10*3/uL — ABNORMAL HIGH (ref 4.0–10.5)

## 2014-05-31 LAB — BRAIN NATRIURETIC PEPTIDE: B NATRIURETIC PEPTIDE 5: 272 pg/mL — AB (ref 0.0–100.0)

## 2014-05-31 LAB — TRIGLYCERIDES: TRIGLYCERIDES: 230 mg/dL — AB (ref <150)

## 2014-05-31 LAB — MAGNESIUM: Magnesium: 2.3 mg/dL (ref 1.7–2.4)

## 2014-05-31 LAB — LACTIC ACID, PLASMA
LACTIC ACID, VENOUS: 3.6 mmol/L — AB (ref 0.5–2.0)
Lactic Acid, Venous: 1.5 mmol/L (ref 0.5–2.0)

## 2014-05-31 LAB — PHOSPHORUS: PHOSPHORUS: 4.2 mg/dL (ref 2.5–4.6)

## 2014-05-31 LAB — MRSA PCR SCREENING: MRSA BY PCR: POSITIVE — AB

## 2014-05-31 MED ORDER — VITAL HIGH PROTEIN PO LIQD
1000.0000 mL | ORAL | Status: DC
  Administered 2014-05-31 – 2014-06-02 (×3): 1000 mL
  Filled 2014-05-31 (×5): qty 1000

## 2014-05-31 MED ORDER — DEXTROSE 5 % IV SOLN
500.0000 mg | INTRAVENOUS | Status: DC
  Filled 2014-05-31: qty 500

## 2014-05-31 MED ORDER — CEFTRIAXONE SODIUM IN DEXTROSE 20 MG/ML IV SOLN
1.0000 g | INTRAVENOUS | Status: DC
  Filled 2014-05-31: qty 50

## 2014-05-31 MED ORDER — PROPOFOL 1000 MG/100ML IV EMUL
INTRAVENOUS | Status: AC
  Filled 2014-05-31: qty 100

## 2014-05-31 MED ORDER — ARTIFICIAL TEARS OP OINT
1.0000 "application " | TOPICAL_OINTMENT | Freq: Three times a day (TID) | OPHTHALMIC | Status: DC
  Administered 2014-05-31 – 2014-06-02 (×6): 1 via OPHTHALMIC
  Filled 2014-05-31: qty 3.5

## 2014-05-31 MED ORDER — CETYLPYRIDINIUM CHLORIDE 0.05 % MT LIQD
7.0000 mL | Freq: Four times a day (QID) | OROMUCOSAL | Status: DC
  Administered 2014-05-31 – 2014-06-03 (×14): 7 mL via OROMUCOSAL

## 2014-05-31 MED ORDER — PRO-STAT SUGAR FREE PO LIQD
30.0000 mL | Freq: Three times a day (TID) | ORAL | Status: DC
  Administered 2014-05-31 – 2014-06-03 (×9): 30 mL
  Filled 2014-05-31 (×11): qty 30

## 2014-05-31 MED ORDER — HEPARIN SODIUM (PORCINE) 5000 UNIT/ML IJ SOLN
5000.0000 [IU] | Freq: Three times a day (TID) | INTRAMUSCULAR | Status: DC
  Administered 2014-05-31 – 2014-06-04 (×12): 5000 [IU] via SUBCUTANEOUS
  Filled 2014-05-31 (×13): qty 1

## 2014-05-31 MED ORDER — FENTANYL CITRATE (PF) 100 MCG/2ML IJ SOLN
INTRAMUSCULAR | Status: AC
  Administered 2014-05-31: 100 ug
  Filled 2014-05-31: qty 2

## 2014-05-31 MED ORDER — CHLORHEXIDINE GLUCONATE 0.12 % MT SOLN
15.0000 mL | Freq: Two times a day (BID) | OROMUCOSAL | Status: DC
  Administered 2014-05-31 – 2014-06-03 (×7): 15 mL via OROMUCOSAL
  Filled 2014-05-31 (×6): qty 15

## 2014-05-31 MED ORDER — VANCOMYCIN HCL 10 G IV SOLR
1250.0000 mg | INTRAVENOUS | Status: DC
  Filled 2014-05-31: qty 1250

## 2014-05-31 MED ORDER — CISATRACURIUM BOLUS VIA INFUSION
0.0500 mg/kg | Freq: Once | INTRAVENOUS | Status: AC
  Administered 2014-05-31: 4.8 mg via INTRAVENOUS
  Filled 2014-05-31: qty 5

## 2014-05-31 MED ORDER — VANCOMYCIN HCL 10 G IV SOLR
2000.0000 mg | Freq: Once | INTRAVENOUS | Status: AC
  Administered 2014-05-31: 2000 mg via INTRAVENOUS
  Filled 2014-05-31: qty 2000

## 2014-05-31 MED ORDER — PANTOPRAZOLE SODIUM 40 MG IV SOLR
40.0000 mg | INTRAVENOUS | Status: DC
  Administered 2014-05-31 – 2014-06-01 (×2): 40 mg via INTRAVENOUS
  Filled 2014-05-31 (×4): qty 40

## 2014-05-31 MED ORDER — SODIUM CHLORIDE 0.9 % IV SOLN
25.0000 ug/h | INTRAVENOUS | Status: DC
  Administered 2014-05-31: 100 ug/h via INTRAVENOUS
  Filled 2014-05-31: qty 50

## 2014-05-31 MED ORDER — NOREPINEPHRINE BITARTRATE 1 MG/ML IV SOLN
2.0000 ug/min | INTRAVENOUS | Status: DC
  Administered 2014-05-31: 2 ug/min via INTRAVENOUS
  Administered 2014-06-01: 5 ug/min via INTRAVENOUS
  Administered 2014-06-01: 8 ug/min via INTRAVENOUS
  Filled 2014-05-31 (×3): qty 4

## 2014-05-31 MED ORDER — ALBUTEROL SULFATE (2.5 MG/3ML) 0.083% IN NEBU: 15.0000 mg | INHALATION_SOLUTION | Freq: Once | RESPIRATORY_TRACT | Status: DC

## 2014-05-31 MED ORDER — MUPIROCIN 2 % EX OINT
1.0000 "application " | TOPICAL_OINTMENT | Freq: Two times a day (BID) | CUTANEOUS | Status: AC
  Administered 2014-05-31 – 2014-06-04 (×10): 1 via NASAL
  Filled 2014-05-31 (×3): qty 22

## 2014-05-31 MED ORDER — MIDAZOLAM HCL 2 MG/2ML IJ SOLN
INTRAMUSCULAR | Status: AC
  Administered 2014-05-31: 2 mg
  Filled 2014-05-31: qty 2

## 2014-05-31 MED ORDER — PIPERACILLIN-TAZOBACTAM 3.375 G IVPB
3.3750 g | Freq: Three times a day (TID) | INTRAVENOUS | Status: DC
  Administered 2014-05-31 – 2014-06-04 (×13): 3.375 g via INTRAVENOUS
  Filled 2014-05-31 (×15): qty 50

## 2014-05-31 MED ORDER — CETYLPYRIDINIUM CHLORIDE 0.05 % MT LIQD: 7.0000 mL | Freq: Two times a day (BID) | OROMUCOSAL | Status: DC

## 2014-05-31 MED ORDER — ETOMIDATE 2 MG/ML IV SOLN
10.0000 mg | Freq: Once | INTRAVENOUS | Status: AC
  Administered 2014-05-31: 10 mg via INTRAVENOUS

## 2014-05-31 MED ORDER — SODIUM CHLORIDE 0.9 % IV SOLN: INTRAVENOUS | Status: DC

## 2014-05-31 MED ORDER — CHLORHEXIDINE GLUCONATE CLOTH 2 % EX PADS
6.0000 | MEDICATED_PAD | Freq: Every day | CUTANEOUS | Status: AC
  Administered 2014-05-31 – 2014-06-04 (×5): 6 via TOPICAL

## 2014-05-31 MED ORDER — FENTANYL BOLUS VIA INFUSION
50.0000 ug | INTRAVENOUS | Status: DC | PRN
  Administered 2014-06-02: 50 ug via INTRAVENOUS
  Filled 2014-05-31: qty 50

## 2014-05-31 MED ORDER — CHLORHEXIDINE GLUCONATE 0.12 % MT SOLN
15.0000 mL | Freq: Two times a day (BID) | OROMUCOSAL | Status: DC
  Filled 2014-05-31: qty 15

## 2014-05-31 MED ORDER — SODIUM CHLORIDE 0.9 % IV BOLUS (SEPSIS)
500.0000 mL | Freq: Once | INTRAVENOUS | Status: AC
  Administered 2014-05-31: 500 mL via INTRAVENOUS

## 2014-05-31 MED ORDER — IPRATROPIUM-ALBUTEROL 0.5-2.5 (3) MG/3ML IN SOLN
3.0000 mL | Freq: Four times a day (QID) | RESPIRATORY_TRACT | Status: DC
  Administered 2014-05-31 – 2014-06-01 (×6): 3 mL via RESPIRATORY_TRACT
  Filled 2014-05-31 (×5): qty 3

## 2014-05-31 MED ORDER — ONDANSETRON HCL 4 MG/2ML IJ SOLN: 4.0000 mg | Freq: Four times a day (QID) | INTRAMUSCULAR | Status: DC | PRN

## 2014-05-31 MED ORDER — FENTANYL CITRATE (PF) 100 MCG/2ML IJ SOLN: 100.0000 ug | Freq: Once | INTRAMUSCULAR | Status: DC

## 2014-05-31 MED ORDER — SODIUM CHLORIDE 0.9 % IV BOLUS (SEPSIS): 1000.0000 mL | Freq: Once | INTRAVENOUS | Status: DC

## 2014-05-31 MED ORDER — DEXTROSE 5 % IV SOLN
1.0000 g | Freq: Once | INTRAVENOUS | Status: AC
  Administered 2014-05-31: 1 g via INTRAVENOUS
  Filled 2014-05-31: qty 10

## 2014-05-31 MED ORDER — SODIUM CHLORIDE 0.9 % IV SOLN
3.0000 ug/kg/min | INTRAVENOUS | Status: DC
  Administered 2014-05-31 – 2014-06-02 (×4): 3 ug/kg/min via INTRAVENOUS
  Filled 2014-05-31 (×4): qty 20

## 2014-05-31 MED ORDER — ENOXAPARIN SODIUM 40 MG/0.4ML ~~LOC~~ SOLN
40.0000 mg | Freq: Every day | SUBCUTANEOUS | Status: DC
  Filled 2014-05-31: qty 0.4

## 2014-05-31 MED ORDER — HEPARIN SODIUM (PORCINE) 5000 UNIT/ML IJ SOLN: 5000.0000 [IU] | Freq: Three times a day (TID) | INTRAMUSCULAR | Status: DC

## 2014-05-31 MED ORDER — DEXTROSE 5 % IV SOLN
500.0000 mg | Freq: Once | INTRAVENOUS | Status: AC
  Administered 2014-05-31: 500 mg via INTRAVENOUS
  Filled 2014-05-31: qty 500

## 2014-05-31 MED ORDER — INSULIN ASPART 100 UNIT/ML ~~LOC~~ SOLN
0.0000 [IU] | SUBCUTANEOUS | Status: DC
  Administered 2014-05-31: 2 [IU] via SUBCUTANEOUS
  Administered 2014-05-31: 8 [IU] via SUBCUTANEOUS
  Administered 2014-05-31: 2 [IU] via SUBCUTANEOUS
  Administered 2014-05-31: 3 [IU] via SUBCUTANEOUS
  Administered 2014-05-31: 5 [IU] via SUBCUTANEOUS
  Administered 2014-05-31: 3 [IU] via SUBCUTANEOUS
  Administered 2014-06-01 (×3): 2 [IU] via SUBCUTANEOUS
  Administered 2014-06-01 (×2): 3 [IU] via SUBCUTANEOUS
  Administered 2014-06-02 (×4): 2 [IU] via SUBCUTANEOUS
  Administered 2014-06-02: 3 [IU] via SUBCUTANEOUS
  Administered 2014-06-03: 2 [IU] via SUBCUTANEOUS

## 2014-05-31 MED ORDER — PROPOFOL 1000 MG/100ML IV EMUL
5.0000 ug/kg/min | INTRAVENOUS | Status: DC
  Administered 2014-05-31: 20 ug/kg/min via INTRAVENOUS

## 2014-05-31 MED ORDER — ROCURONIUM BROMIDE 50 MG/5ML IV SOLN
50.0000 mg | Freq: Once | INTRAVENOUS | Status: AC
  Administered 2014-05-31: 50 mg via INTRAVENOUS

## 2014-05-31 MED ORDER — FENTANYL CITRATE (PF) 100 MCG/2ML IJ SOLN: 25.0000 ug | INTRAMUSCULAR | Status: DC | PRN

## 2014-05-31 MED ORDER — STERILE WATER FOR INJECTION IV SOLN
INTRAVENOUS | Status: DC
  Administered 2014-05-31 – 2014-06-01 (×3): via INTRAVENOUS
  Filled 2014-05-31 (×6): qty 850

## 2014-05-31 MED ORDER — SODIUM CHLORIDE 0.9 % IV SOLN
25.0000 ug/h | INTRAVENOUS | Status: DC
  Administered 2014-05-31 – 2014-06-01 (×4): 300 ug/h via INTRAVENOUS
  Administered 2014-06-02: 250 ug/h via INTRAVENOUS
  Filled 2014-05-31 (×6): qty 50

## 2014-05-31 MED ORDER — ALBUTEROL SULFATE (2.5 MG/3ML) 0.083% IN NEBU: 2.5000 mg | INHALATION_SOLUTION | RESPIRATORY_TRACT | Status: DC | PRN

## 2014-05-31 MED ORDER — SODIUM CHLORIDE 0.9 % IV SOLN
250.0000 mL | INTRAVENOUS | Status: DC | PRN
  Administered 2014-05-31: 250 mL via INTRAVENOUS

## 2014-05-31 MED ORDER — FENTANYL CITRATE (PF) 100 MCG/2ML IJ SOLN: 100.0000 ug | Freq: Once | INTRAMUSCULAR | Status: AC | PRN

## 2014-05-31 MED ORDER — PROPOFOL 1000 MG/100ML IV EMUL
5.0000 ug/kg/min | INTRAVENOUS | Status: DC
  Administered 2014-05-31: 30 ug/kg/min via INTRAVENOUS
  Administered 2014-05-31 – 2014-06-01 (×2): 35 ug/kg/min via INTRAVENOUS
  Administered 2014-06-01: 40 ug/kg/min via INTRAVENOUS
  Administered 2014-06-01: 33 ug/kg/min via INTRAVENOUS
  Administered 2014-06-01: 35 ug/kg/min via INTRAVENOUS
  Administered 2014-06-02: 25 ug/kg/min via INTRAVENOUS
  Administered 2014-06-02: 30 ug/kg/min via INTRAVENOUS
  Filled 2014-05-31 (×8): qty 100
  Filled 2014-05-31: qty 200

## 2014-05-31 MED ORDER — ACETAMINOPHEN 325 MG PO TABS: 650.0000 mg | ORAL_TABLET | ORAL | Status: DC | PRN

## 2014-05-31 NOTE — Procedures (Signed)
Intubation Procedure Note Jamie Bell [id number] [birth year]  Procedure: Intubation Indications: Respiratory insufficiency  Procedure Details Consent: Risks of procedure as well as the alternatives and risks of each were explained to the (patient/caregiver).  Consent for procedure obtained. Time Out: Verified patient identification, verified procedure, site/side was marked, verified correct patient position, special equipment/implants available, medications/allergies/relevent history reviewed, required imaging and test results available.  Performed  MAC and 3 Medications:  Fentanyl 100 mcg Etomidate 20 mg Versed 2 mg NMB rocuronium 50 mg     Evaluation Hemodynamic Status: BP stable throughout; O2 sats: stable throughout Patient's Current Condition: stable Complications: No apparent complications Patient did tolerate procedure well. Chest X-ray ordered to verify placement.  CXR: pending.   Richardson Landry Minor ACNP Maryanna Shape PCCM Pager [phone] till 3 pm If no answer page [phone] 05/31/2014, 7:55 AM  I was present and supervised the entire procedure.  Rush Farmer, M.D. [facility] Pulmonary/Critical Care Medicine. Pager: [phone]. After hours pager: [phone].

## 2014-05-31 NOTE — Progress Notes (Signed)
Notified Dr. Nelda Marseille of pt's positive MRSA pcr.

## 2014-05-31 NOTE — Procedures (Signed)
Central Venous Catheter Insertion Procedure Note EDYNN GILLOCK [id number] Nov 03, 1970  Procedure: Insertion of Central Venous Catheter Indications: Assessment of intravascular volume, Drug and/or fluid administration and Frequent blood sampling  Procedure Details Consent: Risks of procedure as well as the alternatives and risks of each were explained to the (patient/caregiver).  Consent for procedure obtained. Time Out: Verified patient identification, verified procedure, site/side was marked, verified correct patient position, special equipment/implants available, medications/allergies/relevent history reviewed, required imaging and test results available.  Performed  Maximum sterile technique was used including antiseptics, cap, gloves, gown, hand hygiene, mask and sheet. Skin prep: Chlorhexidine; local anesthetic administered A antimicrobial bonded/coated triple lumen catheter was placed in the left internal jugular vein using the Seldinger technique. Ultrasound guidance used.Yes.   Catheter placed to 20 cm. Blood aspirated via all 3 ports and then flushed x 3. Line sutured x 2 and dressing applied.  Evaluation Blood flow good Complications: No apparent complications Patient did tolerate procedure well. Chest X-ray ordered to verify placement.  CXR: pending.  Richardson Landry Minor ACNP Maryanna Shape PCCM Pager [phone] till 3 pm If no answer page [phone] 05/31/2014, 8:02 AM  U/S used in placement.  I was present and supervised then entire procedure.  Rush Farmer, M.D. [facility] Pulmonary/Critical Care Medicine. Pager: [phone]. After hours pager: [phone].

## 2014-05-31 NOTE — Progress Notes (Signed)
ANTIBIOTIC CONSULT NOTE - INITIAL  Pharmacy Consult for Vancomycin, Zosyn Indication: pneumonia  Allergies  Allergen Reactions  . Ciprofloxacin Swelling  . Nsaids Hives   Patient Measurements: Height: 5' 5"  (165.1 cm) Weight: 211 lb 13.8 oz (96.1 kg) IBW/kg (Calculated) : 57 Vital Signs: Temp: 98.8 F (37.1 C) (05/05 0802) Temp Source: Axillary (05/05 0802) BP: 167/68 mmHg (05/05 0800) Pulse Rate: 143 (05/05 0806) Intake/Output from previous day: 05/04 0701 - 05/05 0700 In: 115.8 [I.V.:115.8] Out: 480 [Urine:480] Intake/Output from this shift: Total I/O In: 60 [I.V.:60] Out: -   Labs:  Recent Labs  05/30/14 2350 05/31/14 0516  WBC 24.3* 16.2*  HGB 13.4 12.5  PLT 183 146*  CREATININE 2.46* 2.12*   Estimated Creatinine Clearance: 39.6 mL/min (by C-G formula based on Cr of 2.12). No results for input(s): VANCOTROUGH, VANCOPEAK, VANCORANDOM, GENTTROUGH, GENTPEAK, GENTRANDOM, TOBRATROUGH, TOBRAPEAK, TOBRARND, AMIKACINPEAK, AMIKACINTROU, AMIKACIN in the last 72 hours.   Microbiology: Recent Results (from the past 720 hour(s))  Blood culture (routine x 2)     Status: None (Preliminary result)   Collection Time: 05/31/14  1:23 AM  Result Value Ref Range Status   Specimen Description BLOOD LEFT ANTECUBITAL  Final   Special Requests   Final    BOTTLES DRAWN AEROBIC AND ANAEROBIC AEB 6CC ANA 8CC   Culture PENDING  Incomplete   Report Status PENDING  Incomplete  Blood culture (routine x 2)     Status: None (Preliminary result)   Collection Time: 05/31/14  1:40 AM  Result Value Ref Range Status   Specimen Description BLOOD LEFT HAND  Final   Special Requests   Final    BOTTLES DRAWN AEROBIC AND ANAEROBIC AEB 8CC ANA [place]   Culture PENDING  Incomplete   Report Status PENDING  Incomplete  MRSA PCR Screening     Status: Abnormal   Collection Time: 05/31/14  4:36 AM  Result Value Ref Range Status   MRSA by PCR POSITIVE (A) NEGATIVE Final    Comment:        The  GeneXpert MRSA Assay (FDA approved for NASAL specimens only), is one component of a comprehensive MRSA colonization surveillance program. It is not intended to diagnose MRSA infection nor to guide or monitor treatment for MRSA infections. RESULT CALLED TO, READ BACK BY AND VERIFIED WITH: NOTIFIED PIKE,K RN @ [phone] 05/31/14 YARBROUGH,S     Medical History: Past Medical History  Diagnosis Date  . Diabetes mellitus   . Chronic back pain   . PCOS (polycystic ovarian syndrome)   . Depression   . DDD (degenerative disc disease)   . Decreased hearing on the right.    F/u by ENT in the past and no further management.  Marland Kitchen GERD (gastroesophageal reflux disease)   . Sciatica     HNP- Cerv. 7- T2  . MRSA bacteremia 2014    Tx with Vancomycin  . Hypertension     took med. for HTN, several yrs. ago, no longer needs , seen by Dr. Lattie Haw last yr. during a hosp. at [facility], for MRSA bacteremia     Medications:  Anti-infectives    Start     Dose/Rate Route Frequency Ordered Stop   06/01/14 1000  vancomycin (VANCOCIN) 1,250 mg in sodium chloride 0.9 % 250 mL IVPB     1,250 mg 166.7 mL/hr over 90 Minutes Intravenous Every 24 hours 05/31/14 0909     05/31/14 2200  cefTRIAXone (ROCEPHIN) 1 g in dextrose 5 % 50 mL IVPB -  Premix  Status:  Discontinued     1 g 100 mL/hr over 30 Minutes Intravenous Every 24 hours 05/31/14 0418 05/31/14 0807   05/31/14 2200  azithromycin (ZITHROMAX) 500 mg in dextrose 5 % 250 mL IVPB  Status:  Discontinued     500 mg 250 mL/hr over 60 Minutes Intravenous Every 24 hours 05/31/14 0418 05/31/14 0807   05/31/14 0900  piperacillin-tazobactam (ZOSYN) IVPB 3.375 g     3.375 g 12.5 mL/hr over 240 Minutes Intravenous 3 times per day 05/31/14 0858     05/31/14 0900  vancomycin (VANCOCIN) 2,000 mg in sodium chloride 0.9 % 500 mL IVPB     2,000 mg 250 mL/hr over 120 Minutes Intravenous  Once 05/31/14 0859     05/31/14 0115  cefTRIAXone (ROCEPHIN) 1 g in dextrose 5 % 50 mL IVPB      1 g 100 mL/hr over 30 Minutes Intravenous  Once 05/31/14 0102 05/31/14 0155   05/31/14 0115  azithromycin (ZITHROMAX) 500 mg in dextrose 5 % 250 mL IVPB     500 mg 250 mL/hr over 60 Minutes Intravenous  Once 05/31/14 0102 05/31/14 0305     Assessment: 44 year old female transitioning from Ceftriaxone and Azithromycin to Zosyn and Vancomycin for HCAP with history of IVDU and MRSA bacteremia. Last doses 0130AM. WBC elevated, lactic acid trending down, PCT 11. Patient is currently afebrile. Paitent was intubated this AM for respiratory failure. Cultures are pending.   Goal of Therapy:  Vancomycin trough level 15-20 mcg/ml  Plan:  Zosyn 3.375g IV every 8h- 4hr infusion. Vancomycin 2g IV x1, then 1224m IV every 24 hours.   JSloan Leiter PharmD, BCPS Clinical Pharmacist [phone]/05/2014,8:56 AM

## 2014-05-31 NOTE — Progress Notes (Signed)
Initial Nutrition Assessment  DOCUMENTATION CODES:  Obesity unspecified  INTERVENTION:   Tube feeding   Initiate TF via OGT with Vital High Protein at 10 ml/h and Prostat 30 ml TID on day 1; on day 2, continue goal rate of 10 ml/h (240 ml per day) to provide 540 kcals, 66 gm protein, 201 ml free water daily.  Above TF regimen plus kcals from Propofol will provide a total of 1300 kcals per day.  Unable to meet protein needs at this time without overfeeding due to high Propofol rate.   NUTRITION DIAGNOSIS:  Inadequate oral intake related to inability to eat as evidenced by NPO status.   GOAL:  Provide needs based on ASPEN/SCCM guidelines   MONITOR:  Vent status, Labs, Weight trends, TF tolerance  REASON FOR ASSESSMENT:  Consult Enteral/tube feeding initiation and management  ASSESSMENT:  Patient brought to [facility] on 5/4 for SOB. In ED was hypoxic despite NRB. Transferred to [facility] for further management. Required intubation on 5/5 AM.  Received MD Consult for TF initiation and management.  Patient is currently intubated on ventilator support MV: 16.3 L/min Temp (24hrs), Avg:98.5 F (36.9 C), Min:98.1 F (36.7 C), Max:98.8 F (37.1 C)  Propofol: 28.8 ml/hr providing 760 kcals/day.  Height:  Ht Readings from Last 1 Encounters:  05/31/14 5' 5"  (1.651 m)    Weight:  Wt Readings from Last 1 Encounters:  05/31/14 211 lb 13.8 oz (96.1 kg)    Ideal Body Weight:  56.8 kg  Wt Readings from Last 10 Encounters:  05/31/14 211 lb 13.8 oz (96.1 kg)  11/22/13 220 lb (99.791 kg)  08/10/12 209 lb 12.8 oz (95.165 kg)  05/02/12 189 lb (85.73 kg)  11/30/11 192 lb 11.2 oz (87.408 kg)  11/10/11 187 lb (84.823 kg)  11/08/11 187 lb (84.823 kg)  10/22/11 189 lb (85.73 kg)  10/12/11 190 lb (86.183 kg)  09/14/11 189 lb (85.73 kg)    BMI:  Body mass index is 35.26 kg/(m^2).  Estimated Nutritional Needs:  Kcal:  7579-7282  Protein:  114 gm  Fluid:  1.7 L  Skin:   Reviewed, no issues  Diet Order:  Diet NPO time specified  EDUCATION NEEDS:  No education needs identified at this time   Intake/Output Summary (Last 24 hours) at 05/31/14 1316 Last data filed at 05/31/14 1200  Gross per 24 hour  Intake 1273.05 ml  Output    855 ml  Net 418.05 ml    Last BM:  5/4   Molli Barrows, RD, LDN, [place] Pager [phone] After Hours Pager [phone]

## 2014-05-31 NOTE — H&P (Addendum)
PULMONARY / CRITICAL CARE MEDICINE   Name: Jamie Bell MRN: [id number] DOB: [birth year]    ADMISSION DATE:  05/30/2014 CONSULTATION DATE:  05/31/2014  REFERRING MD :  EDP at [place]:  SOB  INITIAL PRESENTATION:  44 y.o. F brought to [facility] 5/4 for SOB.  In ED, was significantly hypoxic and remained so despite NRB.  She was transferred to [facility] for further management due to concerns that she may require intubation.     STUDIES:  CXR 5/5 >>> b/l airspace consolidation  SIGNIFICANT EVENTS: 5/4 - presented to [facility] with SOB / respiratory distress.  Transferred to [facility] for persistent hypoxia and concerns she may require intubation   HISTORY OF PRESENT ILLNESS:  Jamie Bell is a 44 y.o. F with PMH as outlined below.  She presented to [facility] ED 5/4 with SOB.  She reported that she had a panic attack earlier that morning around 9:30 AM and since then had been experiencing SOB.  She felt to be in her USOH prior to that.  She denied any fevers, chills, sweats, chest pain, cough, N/V/D, abdominal pain.  Has had some back and leg discomfort but she states that these are chronic problems for her.  She has not had any recent travel or exposure to known sick contacts.  In ED, CXR revealed bilateral infiltrates and she remained hypoxic despite supplemental O2 via [place] and NRB.  She was given lasix and breathing treatments without any improvement in her respiratory status.  She was treated empirically for CAP and was transferred to [facility] for further evaluation and management.  En route to [facility], she was placed on BiPAP due to persistent hypoxia.  On arrival to [facility], she feels that her breathing is improved though she is not back to her baseline yet.  Currently denies any chest pain, difficulty breathing.  She realizes the importance of continuing with BiPAP in an effort to spare her from possible intubation.  She is currently comfortable on BiPAP after receiving 69m Ativan en route.  She is an active  smoker with a 30 pack year history.  PFT's from 2014 with no obstruction (ratio 80).    Of note, on her exam, she has multiple bruises that appear as tract marks on bilateral UE's.  She adamantly denies any illicit drug use.  PAST MEDICAL HISTORY :   has a past medical history of Diabetes mellitus; Chronic back pain; PCOS (polycystic ovarian syndrome); Depression; DDD (degenerative disc disease); Decreased hearing (on the right.); GERD (gastroesophageal reflux disease); Sciatica; MRSA bacteremia (2014); and Hypertension.  has past surgical history that includes Back surgery; Cholecystectomy; Neck surgery; Carpal tunnel release; TEE without cardioversion (N/A, 08/09/2012); Peripherally inserted central catheter insertion (Right, 2007); Breast surgery (Left); and Posterior cervical laminectomy (N/A, 11/22/2013). Prior to Admission medications   Medication Sig Start Date End Date Taking? Authorizing Provider  acetaminophen (TYLENOL) 500 MG tablet Take 1,000 mg by mouth every 6 (six) hours as needed.    Historical Provider, MD  aspirin-acetaminophen-caffeine (EXCEDRIN MIGRAINE) [id number] per tablet Take by mouth every 6 (six) hours as needed for headache.    Historical Provider, MD  Canagliflozin (INVOKANA) 100 MG TABS Take 150 mg by mouth daily before breakfast.     Historical Provider, MD  methocarbamol (ROBAXIN-750) 750 MG tablet Take 1 tablet (750 mg total) by mouth every 6 (six) hours as needed for muscle spasms. 11/23/13   GKary Kos MD  oxyCODONE (ROXICODONE) 15 MG immediate release tablet Take  15 mg by mouth every 4 (four) hours as needed for pain.    Historical Provider, MD  oxyCODONE (ROXICODONE) 30 MG immediate release tablet Take 1 tablet (30 mg total) by mouth every 4 (four) hours as needed for moderate pain. 11/23/13   Kary Kos, MD  pantoprazole (PROTONIX) 40 MG tablet Take 40 mg by mouth daily before breakfast.     Historical Provider, MD   Allergies  Allergen Reactions  .  Ciprofloxacin Swelling  . Nsaids Hives    FAMILY HISTORY:  Family History  Problem Relation Age of Onset  . Hypertension Mother   . Hypertension Father   . Diabetes Other   . Diabetes Brother     SOCIAL HISTORY:  reports that she has been smoking Cigarettes.  She has a 30 pack-year smoking history. She has never used smokeless tobacco. She reports that she uses illicit drugs (IV and Marijuana). She reports that she does not drink alcohol.  REVIEW OF SYSTEMS:  All negative; except for those that are bolded, which indicate positives.  Constitutional: weight loss, weight gain, night sweats, fevers, chills, fatigue, weakness.  HEENT: headaches, sore throat, sneezing, nasal congestion, post nasal drip, difficulty swallowing, tooth/dental problems, visual complaints, visual changes, ear aches. Neuro: difficulty with speech, weakness, numbness, ataxia. CV:  chest pain, orthopnea, PND, swelling in lower extremities, dizziness, palpitations, syncope.  Resp: cough, hemoptysis, dyspnea, wheezing. GI  heartburn, indigestion, abdominal pain, nausea, vomiting, diarrhea, constipation, change in bowel habits, loss of appetite, hematemesis, melena, hematochezia.  GU: dysuria, change in color of urine, urgency or frequency, flank pain, hematuria. MSK: joint pain or swelling, decreased range of motion, back pain (chronic), bilateral leg cramps (chronic). Psych: change in mood or affect, depression, anxiety, suicidal ideations, homicidal ideations. Skin: rash, itching, bruising.   SUBJECTIVE:  Breathing has improved since starting BiPAP but pt feels not back to baseline yet.  Was uncomfortable en route and anxious so was given 87m Ativan with good response.  VITAL SIGNS: Temp:  [98.3 F (36.8 C)] 98.3 F (36.8 C) (05/05 0201) Pulse Rate:  [107-134] 122 (05/05 0404) Resp:  [17-30] 30 (05/05 0404) BP: (81-112)/(58-73) 96/62 mmHg (05/05 0230) SpO2:  [63 %-100 %] 100 % (05/05 0404) HEMODYNAMICS:    VENTILATOR SETTINGS:   INTAKE / OUTPUT: Intake/Output    None     PHYSICAL EXAMINATION: General: Young obese female, in NAD. Neuro: A&O x 3, non-focal.  HEENT: Mark/AT. PERRL, sclerae anicteric.  BiPAP mask in place. Cardiovascular: Tachy, regular, no M/R/G.  Lungs: Respirations shallow and unlabored.  Coarse rhonchi in bilateral bases.  No wheezes. Abdomen: BS x 4, soft, NT/ND.  Musculoskeletal: No gross deformities, no edema. ? Tract marks on bilateral UE's. Skin: Intact, warm, no rashes.  LABS:  CBC  Recent Labs Lab 05/30/14 2350  WBC 24.3*  HGB 13.4  HCT 39.9  PLT 183   Coag's No results for input(s): APTT, INR in the last 168 hours. BMET  Recent Labs Lab 05/30/14 2350  NA 133*  K 4.8  CL 102  CO2 14*  BUN 42*  CREATININE 2.46*  GLUCOSE 249*   Electrolytes  Recent Labs Lab 05/30/14 2350  CALCIUM 8.8*   Sepsis Markers  Recent Labs Lab 05/31/14 0123  LATICACIDVEN 3.6*   ABG  Recent Labs Lab 05/31/14 0014  PHART 7.287*  PCO2ART 31.1*  PO2ART 52.5*   Liver Enzymes  Recent Labs Lab 05/30/14 2350  AST 130*  ALT 49  ALKPHOS 142*  BILITOT 0.6  ALBUMIN  4.0   Cardiac Enzymes  Recent Labs Lab 05/30/14 2350  TROPONINI 0.14*   Glucose No results for input(s): GLUCAP in the last 168 hours.  Imaging Dg Chest Port 1 View  05/31/2014   CLINICAL DATA:  Severe dyspnea and hypoxia  EXAM: PORTABLE CHEST - 1 VIEW  COMPARISON:  08/08/2012  FINDINGS: There is airspace consolidation in the central and basilar regions bilaterally. This may represent infectious infiltrate. Parenchymal hemorrhage could produce this appearance. Alveolar edema is less likely. No large effusions are evident.  IMPRESSION: Bilateral airspace consolidation in the central and basilar regions.   Electronically Signed   By: Andreas Newport M.D.   On: 05/31/2014 00:16    ASSESSMENT / PLAN:  PULMONARY A: Acute hypoxemic respiratory failure CAP Tobacco use disorder P:    Intubate. Mechanically ventilate. ABG and CXR ordered. Abx per ID section. DuoNebs / Albuterol PRN. ABG and CXR in AM.  CARDIOVASCULAR A:  Sinus tachycardia Elevated troponin Mildly elevated BNP Hx HTN P:  Repeat troponin. Repeat lactate. 2D echo to R/O endocarditis. Monitor hemodynamics. CVP checks.  RENAL A:   AG + NAG metabolic acidosis Hyponatremia AKI P:   Increase HCO3 @ 100. Repeat lactate. BMP in AM.  GASTROINTESTINAL A:   GERD - not on meds Nutrition P:   NPO. Consult nutrition for TF.  HEMATOLOGIC A:   VTE Prophylaxis P:  SCD's. D/C lovenox (renal function). Heparin 5000 units SQ q8 hours. CBC in AM.  INFECTIOUS A:   CAP P:   BCx2 5/4 > UCx 5/4 > Sputum Cx 5/4 > Abx: Ceftriaxone, start date 5/4, day 2/x. Abx: Azithromycin, start date 5/4, day 2/x. PCT algorithm to limit abx exposure.  ENDOCRINE A:   DM  P:   CBG's q4hr. SSI. Hold outpatient canagliflozin for now.  NEUROLOGIC A:   Anxiety / Depression - not on meds Chronic back pain, DDD, Sciatica P:   Hold outpatient robaxin, oxycodone, excedrin migraine for now. Propofol and fentanyl drips ordered.  Family updated: None.  Interdisciplinary Family Meeting v Palliative Care Meeting:  Due by: 06/06/14.   Montey Hora, [facility] Pager: [phone]  or [phone] 05/31/2014, 67:47 AM  44 year old female with PMH of IVDU and MRSA bacteremia who presents to the hospital with SOB.  Patient was noted to have bilateral pulmonary infiltrate and acute respiratory and renal failure.  She was unable to compensate for her renal failure induced acidosis via respiratory compensation.  Was becoming more confused.  Decision was made to intubate the patient and patient agreed.  Will intubate, mechanically ventilate, sedation and vent settings entered.  Change abx to vanc/zosyn given MRSA bacteremia.  2D echo ordered to r/o endocarditis.  F/U CXR  and ABG ordered.  Patient seen and examined, diffuse crackles.  Renal U/S ordered.  Above note edited in full.  The patient is critically ill with multiple organ systems failure and requires high complexity decision making for assessment and support, frequent evaluation and titration of therapies, application of advanced monitoring technologies and extensive interpretation of multiple databases.   Critical Care Time devoted to patient care services described in this note is  45  Minutes. This time reflects time of care of this signee Dr Jennet Maduro. This critical care time does not reflect procedure time, or teaching time or supervisory time of PA/NP/Med student/Med Resident etc but could involve care discussion time.  Rush Farmer, M.D. [facility]  Pulmonary/Critical Care Medicine. Pager: [phone]. After hours pager: [phone].

## 2014-05-31 NOTE — ED Notes (Signed)
Dr Stark Jock made aware of pt lactic acid of 3.6

## 2014-06-01 ENCOUNTER — Ambulatory Visit (HOSPITAL_COMMUNITY): Payer: Medicare Other

## 2014-06-01 ENCOUNTER — Inpatient Hospital Stay (HOSPITAL_COMMUNITY): Payer: Medicare Other

## 2014-06-01 DIAGNOSIS — R6521 Severe sepsis with septic shock: Secondary | ICD-10-CM

## 2014-06-01 DIAGNOSIS — R7881 Bacteremia: Secondary | ICD-10-CM

## 2014-06-01 LAB — CBC
HCT: 33.4 % — ABNORMAL LOW (ref 36.0–46.0)
HEMOGLOBIN: 11.1 g/dL — AB (ref 12.0–15.0)
MCH: 29 pg (ref 26.0–34.0)
MCHC: 33.2 g/dL (ref 30.0–36.0)
MCV: 87.2 fL (ref 78.0–100.0)
Platelets: 147 10*3/uL — ABNORMAL LOW (ref 150–400)
RBC: 3.83 MIL/uL — ABNORMAL LOW (ref 3.87–5.11)
RDW: 16.8 % — ABNORMAL HIGH (ref 11.5–15.5)
WBC: 11.3 10*3/uL — ABNORMAL HIGH (ref 4.0–10.5)

## 2014-06-01 LAB — PROCALCITONIN: Procalcitonin: 5.74 ng/mL

## 2014-06-01 LAB — BASIC METABOLIC PANEL
Anion gap: 12 (ref 5–15)
BUN: 31 mg/dL — ABNORMAL HIGH (ref 6–20)
CALCIUM: 8.3 mg/dL — AB (ref 8.9–10.3)
CO2: 27 mmol/L (ref 22–32)
CREATININE: 1.34 mg/dL — AB (ref 0.44–1.00)
Chloride: 99 mmol/L — ABNORMAL LOW (ref 101–111)
GFR calc Af Amer: 56 mL/min — ABNORMAL LOW (ref >60)
GFR, EST NON AFRICAN AMERICAN: 48 mL/min — AB (ref >60)
GLUCOSE: 143 mg/dL — AB (ref 70–99)
Potassium: 3.1 mmol/L — ABNORMAL LOW (ref 3.5–5.1)
Sodium: 138 mmol/L (ref 135–145)

## 2014-06-01 LAB — POCT I-STAT 3, ART BLOOD GAS (G3+)
Acid-Base Excess: 3 mmol/L — ABNORMAL HIGH (ref 0.0–2.0)
Bicarbonate: 28.3 mEq/L — ABNORMAL HIGH (ref 20.0–24.0)
O2 SAT: 99 %
PCO2 ART: 44.1 mmHg (ref 35.0–45.0)
PO2 ART: 124 mmHg — AB (ref 80.0–100.0)
Patient temperature: 98.6
TCO2: 30 mmol/L (ref 0–100)
pH, Arterial: 7.416 (ref 7.350–7.450)

## 2014-06-01 LAB — GLUCOSE, CAPILLARY
GLUCOSE-CAPILLARY: 135 mg/dL — AB (ref 70–99)
GLUCOSE-CAPILLARY: 148 mg/dL — AB (ref 70–99)
Glucose-Capillary: 135 mg/dL — ABNORMAL HIGH (ref 70–99)
Glucose-Capillary: 138 mg/dL — ABNORMAL HIGH (ref 70–99)
Glucose-Capillary: 151 mg/dL — ABNORMAL HIGH (ref 70–99)
Glucose-Capillary: 152 mg/dL — ABNORMAL HIGH (ref 70–99)
Glucose-Capillary: 157 mg/dL — ABNORMAL HIGH (ref 70–99)

## 2014-06-01 LAB — TRIGLYCERIDES: Triglycerides: 368 mg/dL — ABNORMAL HIGH (ref <150)

## 2014-06-01 LAB — STREP PNEUMONIAE URINARY ANTIGEN: Strep Pneumo Urinary Antigen: NEGATIVE

## 2014-06-01 LAB — PHOSPHORUS: PHOSPHORUS: 2.3 mg/dL — AB (ref 2.5–4.6)

## 2014-06-01 LAB — MAGNESIUM: Magnesium: 2.2 mg/dL (ref 1.7–2.4)

## 2014-06-01 MED ORDER — IPRATROPIUM-ALBUTEROL 0.5-2.5 (3) MG/3ML IN SOLN: 3.0000 mL | RESPIRATORY_TRACT | Status: DC | PRN

## 2014-06-01 MED ORDER — POTASSIUM CHLORIDE 10 MEQ/50ML IV SOLN
10.0000 meq | INTRAVENOUS | Status: AC
  Administered 2014-06-01 (×4): 10 meq via INTRAVENOUS
  Filled 2014-06-01 (×3): qty 50

## 2014-06-01 MED ORDER — VANCOMYCIN HCL 10 G IV SOLR
1250.0000 mg | Freq: Two times a day (BID) | INTRAVENOUS | Status: DC
  Administered 2014-06-01 – 2014-06-04 (×7): 1250 mg via INTRAVENOUS
  Filled 2014-06-01 (×9): qty 1250

## 2014-06-01 MED ORDER — PERFLUTREN LIPID MICROSPHERE
1.0000 mL | INTRAVENOUS | Status: AC | PRN
  Filled 2014-06-01: qty 10

## 2014-06-01 MED ORDER — SODIUM PHOSPHATE 3 MMOLE/ML IV SOLN
10.0000 mmol | Freq: Once | INTRAVENOUS | Status: AC
  Administered 2014-06-01: 10 mmol via INTRAVENOUS
  Filled 2014-06-01: qty 3.33

## 2014-06-01 MED ORDER — SODIUM CHLORIDE 0.9 % IV SOLN
INTRAVENOUS | Status: DC
  Administered 2014-06-01 (×2): via INTRAVENOUS

## 2014-06-01 MED ORDER — ACETAMINOPHEN 160 MG/5ML PO SOLN: 650.0000 mg | Freq: Four times a day (QID) | ORAL | Status: DC | PRN

## 2014-06-01 NOTE — Clinical Social Work Note (Signed)
CSW consult acknowledged:  Clinical Social Worker received a consult in reference to family requesting "rehab facility information". CSW has attempted to contact family and left a message for a returned phone call. CSW to check for family at bedside.   Glendon Axe, MSW, LCSWA [phone] 06/01/2014 9:49 AM

## 2014-06-01 NOTE — Progress Notes (Signed)
ANTIBIOTIC CONSULT NOTE - Follow-up  Pharmacy Consult for Vancomycin, Zosyn Indication: pneumonia  Allergies  Allergen Reactions  . Ciprofloxacin Swelling  . Nsaids Hives   Patient Measurements: Height: 5' 5"  (165.1 cm) Weight: 209 lb 7 oz (95 kg) IBW/kg (Calculated) : 57 Vital Signs: Temp: 99.2 F (37.3 C) (05/06 0732) Temp Source: Oral (05/06 0732) BP: 104/63 mmHg (05/06 0600) Pulse Rate: 94 (05/06 0600) Intake/Output from previous day: 05/05 0701 - 05/06 0700 In: 4849.4 [I.V.:3968.4; NG/GT:231; IV Piggyback:650] Out: 2195 [Urine:2195] Intake/Output from this shift:    Labs:  Recent Labs  05/30/14 2350 05/31/14 0516 06/01/14 0403  WBC 24.3* 16.2* 11.3*  HGB 13.4 12.5 11.1*  PLT 183 146* 147*  CREATININE 2.46* 2.12* 1.34*   Estimated Creatinine Clearance: 62.3 mL/min (by C-G formula based on Cr of 1.34). No results for input(s): VANCOTROUGH, VANCOPEAK, VANCORANDOM, GENTTROUGH, GENTPEAK, GENTRANDOM, TOBRATROUGH, TOBRAPEAK, TOBRARND, AMIKACINPEAK, AMIKACINTROU, AMIKACIN in the last 72 hours.   Microbiology: Recent Results (from the past 720 hour(s))  Blood culture (routine x 2)     Status: None (Preliminary result)   Collection Time: 05/31/14  1:23 AM  Result Value Ref Range Status   Specimen Description BLOOD LEFT ANTECUBITAL  Final   Special Requests   Final    BOTTLES DRAWN AEROBIC AND ANAEROBIC AEB=6CC ANA=8CC   Culture NO GROWTH 1 DAY  Final   Report Status PENDING  Incomplete  Blood culture (routine x 2)     Status: None (Preliminary result)   Collection Time: 05/31/14  1:40 AM  Result Value Ref Range Status   Specimen Description BLOOD LEFT HAND  Final   Special Requests   Final    BOTTLES DRAWN AEROBIC AND ANAEROBIC AEB=8CC ANA=6CC   Culture NO GROWTH 1 DAY  Final   Report Status PENDING  Incomplete  MRSA PCR Screening     Status: Abnormal   Collection Time: 05/31/14  4:36 AM  Result Value Ref Range Status   MRSA by PCR POSITIVE (A) NEGATIVE Final     Comment:        The GeneXpert MRSA Assay (FDA approved for NASAL specimens only), is one component of a comprehensive MRSA colonization surveillance program. It is not intended to diagnose MRSA infection nor to guide or monitor treatment for MRSA infections. RESULT CALLED TO, READ BACK BY AND VERIFIED WITH: NOTIFIED PIKE,K RN @ [phone] 05/31/14 YARBROUGH,S     Medications:  Anti-infectives    Start     Dose/Rate Route Frequency Ordered Stop   06/01/14 1000  vancomycin (VANCOCIN) 1,250 mg in sodium chloride 0.9 % 250 mL IVPB  Status:  Discontinued     1,250 mg 166.7 mL/hr over 90 Minutes Intravenous Every 24 hours 05/31/14 0909 06/01/14 0811   06/01/14 0900  vancomycin (VANCOCIN) 1,250 mg in sodium chloride 0.9 % 250 mL IVPB     1,250 mg 166.7 mL/hr over 90 Minutes Intravenous Every 12 hours 06/01/14 0811     05/31/14 2200  cefTRIAXone (ROCEPHIN) 1 g in dextrose 5 % 50 mL IVPB - Premix  Status:  Discontinued     1 g 100 mL/hr over 30 Minutes Intravenous Every 24 hours 05/31/14 0418 05/31/14 0807   05/31/14 2200  azithromycin (ZITHROMAX) 500 mg in dextrose 5 % 250 mL IVPB  Status:  Discontinued     500 mg 250 mL/hr over 60 Minutes Intravenous Every 24 hours 05/31/14 0418 05/31/14 0807   05/31/14 0900  piperacillin-tazobactam (ZOSYN) IVPB 3.375 g  3.375 g 12.5 mL/hr over 240 Minutes Intravenous 3 times per day 05/31/14 0858     05/31/14 0900  vancomycin (VANCOCIN) 2,000 mg in sodium chloride 0.9 % 500 mL IVPB     2,000 mg 250 mL/hr over 120 Minutes Intravenous  Once 05/31/14 0859 05/31/14 1136   05/31/14 0115  cefTRIAXone (ROCEPHIN) 1 g in dextrose 5 % 50 mL IVPB     1 g 100 mL/hr over 30 Minutes Intravenous  Once 05/31/14 0102 05/31/14 0155   05/31/14 0115  azithromycin (ZITHROMAX) 500 mg in dextrose 5 % 250 mL IVPB     500 mg 250 mL/hr over 60 Minutes Intravenous  Once 05/31/14 0102 05/31/14 0305     Assessment: 44 year old female continues on Vancomycin and Zosyn (Day  #2) for CAP (pt also with h/o IVDU and MRSA bacteremia so using broader abx). WBC down to 11.3, LA 3.6>>1.5, PCT 11->5.7. Afebrile. Noted CXR 5/4 with b/l infiltrates.  CTX 5/4 >>5/5 Azith 5/4 >>5/5 Vanc 5/5 >> Zosyn 5/5 >>  5/4 Blood >>ngtd  Nephrology: SCr down to 1.34, est CrCl 62 ml/min. UOP 1 ml/kg/hr.   Goal of Therapy:  Vancomycin trough level 15-20 mcg/ml  Plan:  Zosyn 3.375g IV every 8h- 4hr infusion Change Vancomycin to 1225m IV q12h  Will f/u renal function, micro data, pt's clinical condition  CSherlon Handing PharmD, BCPS Clinical pharmacist, pager [phone]/06/2014,8:11 AM

## 2014-06-01 NOTE — Clinical Social Work Note (Signed)
Clinical Social Worker received a returned phone call from pt's mother, Shakara Tweedy who reported she believes patient is "taking too many pain medications at once" and that family would like resources for substance abuse facilities.  Pt's mother instructed CSW to leave information at bedside as they are unsure what time they will arrive to [facility] today.   CSW noted pt is currently unresponsive, intubated and on ventilator.   CSW to provide substance abuse resources to pt's family. CSW advised pt's family to contact CSW as needed.  Clinical Social Worker will sign off for now as social work intervention is no longer needed. Please consult [place] again if new need arises.  Glendon Axe, MSW, LCSWA [phone] 06/01/2014 9:58 AM

## 2014-06-01 NOTE — Clinical Documentation Improvement (Signed)
Possible Clinical Conditions?  Septicemia / Sepsis Severe Sepsis Septic Shock Other Condition  Cannot clinically Determine   Supporting Information: Triage Vitals: BP 99/67 mmHg  Pulse 134  Resp 28  SpO2 94%  Pneumonia Acute hypoxemic respiratory failure  Labs: Component     Latest Ref Rng 05/30/2014 05/31/2014 06/01/2014  WBC     4.0 - 10.5 K/uL 24.3 (H) 16.2 (H) 11.3 (H)    Thank You, Alessandra Grout, RN, BSN, CCDS,Clinical Documentation Specialist:  [phone]  [phone]=[facility] [place]- Health Information Management

## 2014-06-01 NOTE — Progress Notes (Signed)
[facility] ADULT ICU REPLACEMENT PROTOCOL FOR AM LAB REPLACEMENT ONLY  The patient does apply for the [facility] Adult ICU Electrolyte Replacment Protocol based on the criteria listed below:   1. Is GFR >/= 40 ml/min? Yes.    Patient's GFR today is 48 2. Is urine output >/= 0.5 ml/kg/hr for the last 6 hours? Yes.   Patient's UOP is 1.0 ml/kg/hr 3. Is BUN < 60 mg/dL? Yes.    Patient's BUN today is 31 4. Abnormal electrolyte(s): K+3.1  Phos 2.3 5. Ordered repletion with: protocol 6. If a panic level lab has been reported, has the CCM MD in charge been notified? No..   Physician:  Sharlene Dory 06/01/2014 5:37 AM

## 2014-06-01 NOTE — Clinical Social Work Note (Signed)
Requested rehab information left at bedside for pt's family. Patient unresponsive, on ventilator and unable to participate in full psychosocial assessment at this time.   Clinical Social Worker will sign off for now as social work intervention is no longer needed. Please consult [place] again if new need arises/ once pt is medically stable for assessment.   Glendon Axe, MSW, LCSWA [phone] 06/01/2014 2:06 PM

## 2014-06-01 NOTE — Progress Notes (Signed)
  Echocardiogram 2D Echocardiogram with Definity has been performed.  Diamond Nickel 06/01/2014, 3:18 PM

## 2014-06-01 NOTE — Progress Notes (Signed)
PULMONARY / CRITICAL CARE MEDICINE   Name: Jamie Bell MRN: [id number] DOB: July 01, 1970    ADMISSION DATE:  05/30/2014  REFERRING MD :  EDP at [place]:  SOB  INITIAL PRESENTATION:   44 yo female smoker presented to [facility] 5/04 with dyspnea from pneumonia.  STUDIES:  5/6 renal u/s >> negative for hydronephrosis  SIGNIFICANT EVENTS: 5/5 transfer from [facility], intubated, septic shock, paralytic started  SUBJECTIVE:  Weaning off pressors.  VITAL SIGNS: Temp:  [98.1 F (36.7 C)-99.9 F (37.7 C)] 99.2 F (37.3 C) (05/06 0732) Pulse Rate:  [89-116] 99 (05/06 1000) Resp:  [18-33] 24 (05/06 1000) BP: (82-113)/(47-63) 112/62 mmHg (05/06 1000) SpO2:  [95 %-100 %] 99 % (05/06 1000) FiO2 (%):  [45 %-60 %] 45 % (05/06 0818) Weight:  [209 lb 7 oz (95 kg)] 209 lb 7 oz (95 kg) (05/06 0348) HEMODYNAMICS: CVP:  [7 mmHg-17 mmHg] 17 mmHg VENTILATOR SETTINGS: Vent Mode:  [-] PRVC FiO2 (%):  [45 %-60 %] 45 % Set Rate:  [30 bmp] 30 bmp Vt Set:  [460 mL] 460 mL PEEP:  [10 cmH20] 10 cmH20 Plateau Pressure:  [26 cmH20-30 cmH20] 29 cmH20 INTAKE / OUTPUT: Intake/Output      05/05 0701 - 05/06 0700 05/06 0701 - 05/07 0700   I.V. (mL/kg) 3968.4 (41.8) 786.9 (8.3)   NG/GT 231 60   IV Piggyback 650 576   Total Intake(mL/kg) 4849.4 (51) 1422.9 (15)   Urine (mL/kg/hr) 2195 (1) 380 (1)   Total Output 2195 380   Net +2654.4 +1042.9          PHYSICAL EXAMINATION: General: paralyzed Neuro: RASS -5 HEENT: ETT in place Cardiovascular: regular Lungs: b/l crackles Lt > Rt Abdomen: soft   Musculoskeletal: 1+ edema Skin: no rashes  LABS:  CBC  Recent Labs Lab 05/30/14 2350 05/31/14 0516 06/01/14 0403  WBC 24.3* 16.2* 11.3*  HGB 13.4 12.5 11.1*  HCT 39.9 36.8 33.4*  PLT 183 146* 147*   BMET  Recent Labs Lab 05/30/14 2350 05/31/14 0516 06/01/14 0403  NA 133* 134* 138  K 4.8 4.1 3.1*  CL 102 103 99*  CO2 14* 18* 27  BUN 42* 38* 31*  CREATININE 2.46* 2.12* 1.34*   GLUCOSE 249* 269* 143*   Electrolytes  Recent Labs Lab 05/30/14 2350 05/31/14 0516 06/01/14 0403  CALCIUM 8.8* 8.7* 8.3*  MG  --  2.3 2.2  PHOS  --  4.2 2.3*   Sepsis Markers  Recent Labs Lab 05/31/14 0123 05/31/14 0516 06/01/14 0403  LATICACIDVEN 3.6* 1.5  --   PROCALCITON  --  11.15 5.74   ABG  Recent Labs Lab 05/31/14 0915 05/31/14 1126 06/01/14 0346  PHART 7.115* 7.268* 7.416  PCO2ART 73.9* 46.8* 44.1  PO2ART 80.0 65.0* 124.0*   Liver Enzymes  Recent Labs Lab 05/30/14 2350  AST 130*  ALT 49  ALKPHOS 142*  BILITOT 0.6  ALBUMIN 4.0   Cardiac Enzymes  Recent Labs Lab 05/30/14 2350 05/31/14 0516  TROPONINI 0.14* 0.17*   Glucose  Recent Labs Lab 05/31/14 1152 05/31/14 1549 05/31/14 1937 05/31/14 2351 06/01/14 0319 06/01/14 0736  GLUCAP 201* 123* 135* 157* 152* 151*    Imaging Dg Chest Port 1 View  05/31/2014   CLINICAL DATA:  Hypoxia/respiratory failure  EXAM: PORTABLE CHEST - 1 VIEW  COMPARISON:  May 30, 2014  FINDINGS: Endotracheal tube tip is 3.9 cm above the carina. Central catheter tip is in the superior vena cava. Nasogastric tube tip and  side port are below the diaphragm. No pneumothorax. There is widespread airspace consolidation throughout both lungs which is slightly more prominent in the right upper lobe and stable elsewhere compared to 1 day prior. Heart size and pulmonary vascularity are normal. No adenopathy appreciable. There is postoperative change in the lower cervical spine.  IMPRESSION: Tube and catheter positions as described without pneumothorax. Widespread airspace disease bilaterally, slightly increased in the right upper lobe and stable elsewhere. Suspect widespread pneumonia, although congestive heart failure potentially could present in this manner. Both entities may exist concurrently.   Electronically Signed   By: Lowella Grip III M.D.   On: 05/31/2014 09:12   Dg Abd Portable 1v  05/31/2014   CLINICAL DATA:  Assess  nasogastric tube positioning  EXAM: PORTABLE ABDOMEN - 1 VIEW  COMPARISON:  None.  FINDINGS: There is an esophagogastric tube present. The proximal port and the tip lie below the level of the GE junction in the region of the proximal fundus. The bowel gas pattern is nonspecific. There surgical clips in the gallbladder fossa. There is gentle dextrocurvature of the lumbar spine and there is multilevel degenerative disc disease.  IMPRESSION: There is appropriate positioning of the nasogastric tube tip and proximal port below the level of the GE junction.   Electronically Signed   By: David  Martinique M.D.   On: 05/31/2014 09:17    ASSESSMENT / PLAN:  PULMONARY A: Acute hypoxemic respiratory failure 2nd to CAP and ARDS. Tobacco use disorder. P:   Full vent support F/u CXR, ABG PRN BD's Continue paralytic for 48 hrs >> plan to d/c 5/07  CARDIOVASCULAR A:  Septic shock 2nd to CAP. Elevated troponin from demand ischemia. P:  Pressors to keep MAP  Goal CVP 6 to 8 F/u Echo  RENAL A:   AKI 2nd to sepsis >> improving. Hypokalemia, hypophosphatemia. Non anion gap acidosis >> improved. CKD stage 2. P:   D/c HCO3 from IV fluid 5/06 Replace electrolytes as needed Monitor renal fx, urine outpt  GASTROINTESTINAL A:   Nutrition. P:   NPO Protonix Tube feeds  HEMATOLOGIC A:   Leukocytosis. P:  F/u CBC SQ heparin for DVT prevention  INFECTIOUS A:   Septic shock 2nd to CAP. P:   Day 3 of Abx, currently on zosyn, vancomycin  MRSA screen 5/05 >> positive Blood 5/05 >> Sputum 5/05 >> Pneumococcal ag 5/06 >> Legionella ag 5/06 >>  ENDOCRINE A:   DM type II with renal complications. P:   SSI Hold outpatient canagliflozin for now  NEUROLOGIC A:   Anxiety / Depression - not on meds. Chronic back pain, DDD, Sciatica. P:   Hold outpatient robaxin, oxycodone, excedrin migraine for now. RASS goal -5 while on paralytic  CC time 40 minutes.  Chesley Mires, MD [facility]  Pulmonary/Critical Care 06/01/2014, 11:19 AM Pager:  [phone] After 3pm call: [phone]

## 2014-06-02 ENCOUNTER — Inpatient Hospital Stay (HOSPITAL_COMMUNITY): Payer: Medicare Other

## 2014-06-02 LAB — BASIC METABOLIC PANEL
ANION GAP: 9 (ref 5–15)
BUN: 30 mg/dL — ABNORMAL HIGH (ref 6–20)
CHLORIDE: 100 mmol/L — AB (ref 101–111)
CO2: 28 mmol/L (ref 22–32)
Calcium: 8.1 mg/dL — ABNORMAL LOW (ref 8.9–10.3)
Creatinine, Ser: 1.03 mg/dL — ABNORMAL HIGH (ref 0.44–1.00)
GFR calc non Af Amer: >60 mL/min (ref >60)
Glucose, Bld: 130 mg/dL — ABNORMAL HIGH (ref 70–99)
POTASSIUM: 3.1 mmol/L — AB (ref 3.5–5.1)
SODIUM: 137 mmol/L (ref 135–145)

## 2014-06-02 LAB — PROCALCITONIN: PROCALCITONIN: 3.72 ng/mL

## 2014-06-02 LAB — POCT I-STAT 3, ART BLOOD GAS (G3+)
ACID-BASE EXCESS: 2 mmol/L (ref 0.0–2.0)
BICARBONATE: 26.2 meq/L — AB (ref 20.0–24.0)
O2 SAT: 96 %
TCO2: 27 mmol/L (ref 0–100)
pCO2 arterial: 40.2 mmHg (ref 35.0–45.0)
pH, Arterial: 7.426 (ref 7.350–7.450)
pO2, Arterial: 80 mmHg (ref 80.0–100.0)

## 2014-06-02 LAB — GLUCOSE, CAPILLARY
GLUCOSE-CAPILLARY: 120 mg/dL — AB (ref 70–99)
GLUCOSE-CAPILLARY: 138 mg/dL — AB (ref 70–99)
GLUCOSE-CAPILLARY: 151 mg/dL — AB (ref 70–99)
Glucose-Capillary: 124 mg/dL — ABNORMAL HIGH (ref 70–99)
Glucose-Capillary: 126 mg/dL — ABNORMAL HIGH (ref 70–99)
Glucose-Capillary: 128 mg/dL — ABNORMAL HIGH (ref 70–99)

## 2014-06-02 LAB — CBC
HEMATOCRIT: 33.1 % — AB (ref 36.0–46.0)
HEMOGLOBIN: 10.8 g/dL — AB (ref 12.0–15.0)
MCH: 28.8 pg (ref 26.0–34.0)
MCHC: 32.6 g/dL (ref 30.0–36.0)
MCV: 88.3 fL (ref 78.0–100.0)
Platelets: 119 10*3/uL — ABNORMAL LOW (ref 150–400)
RBC: 3.75 MIL/uL — ABNORMAL LOW (ref 3.87–5.11)
RDW: 16.7 % — ABNORMAL HIGH (ref 11.5–15.5)
WBC: 6.6 10*3/uL (ref 4.0–10.5)

## 2014-06-02 LAB — PHOSPHORUS: Phosphorus: 2.5 mg/dL (ref 2.5–4.6)

## 2014-06-02 LAB — MAGNESIUM: Magnesium: 2.2 mg/dL (ref 1.7–2.4)

## 2014-06-02 LAB — TRIGLYCERIDES: Triglycerides: 240 mg/dL — ABNORMAL HIGH (ref <150)

## 2014-06-02 MED ORDER — PROPOFOL 1000 MG/100ML IV EMUL
0.0000 ug/kg/min | INTRAVENOUS | Status: DC
  Administered 2014-06-02: 30 ug/kg/min via INTRAVENOUS
  Administered 2014-06-02: 25 ug/kg/min via INTRAVENOUS
  Administered 2014-06-03: 30 ug/kg/min via INTRAVENOUS
  Filled 2014-06-02 (×5): qty 100

## 2014-06-02 MED ORDER — DOCUSATE SODIUM 50 MG/5ML PO LIQD
100.0000 mg | Freq: Two times a day (BID) | ORAL | Status: DC | PRN
  Filled 2014-06-02: qty 10

## 2014-06-02 MED ORDER — MIDAZOLAM HCL 2 MG/2ML IJ SOLN: 2.0000 mg | INTRAMUSCULAR | Status: DC | PRN

## 2014-06-02 MED ORDER — FENTANYL BOLUS VIA INFUSION
50.0000 ug | INTRAVENOUS | Status: DC | PRN
  Filled 2014-06-02: qty 50

## 2014-06-02 MED ORDER — POTASSIUM CHLORIDE 20 MEQ/15ML (10%) PO SOLN
40.0000 meq | ORAL | Status: AC
  Administered 2014-06-02 (×2): 40 meq
  Filled 2014-06-02 (×2): qty 30

## 2014-06-02 MED ORDER — SODIUM CHLORIDE 0.9 % IV SOLN
25.0000 ug/h | INTRAVENOUS | Status: DC
  Administered 2014-06-02 – 2014-06-03 (×3): 300 ug/h via INTRAVENOUS
  Filled 2014-06-02 (×4): qty 50

## 2014-06-02 MED ORDER — POTASSIUM CHLORIDE 20 MEQ/15ML (10%) PO SOLN
40.0000 meq | Freq: Once | ORAL | Status: AC
  Administered 2014-06-02: 40 meq via ORAL
  Filled 2014-06-02: qty 30

## 2014-06-02 MED ORDER — PNEUMOCOCCAL VAC POLYVALENT 25 MCG/0.5ML IJ INJ
0.5000 mL | INJECTION | INTRAMUSCULAR | Status: AC
  Administered 2014-06-03: 0.5 mL via INTRAMUSCULAR
  Filled 2014-06-02: qty 0.5

## 2014-06-02 MED ORDER — PANTOPRAZOLE SODIUM 40 MG PO PACK
40.0000 mg | PACK | Freq: Every day | ORAL | Status: DC
Start: 2014-06-02 — End: 2014-06-03
  Administered 2014-06-02 – 2014-06-03 (×2): 40 mg
  Filled 2014-06-02 (×3): qty 20

## 2014-06-02 MED ORDER — FUROSEMIDE 10 MG/ML IJ SOLN
40.0000 mg | Freq: Once | INTRAMUSCULAR | Status: AC
  Administered 2014-06-02: 40 mg via INTRAVENOUS
  Filled 2014-06-02: qty 4

## 2014-06-02 NOTE — Progress Notes (Signed)
[place] Progress Note Patient Name: Jamie Bell DOB: 01/06/1971 MRN: [id number]   Date of Service  06/02/2014  HPI/Events of Note  Hypokalemia  eICU Interventions  Potassium replaced     Intervention Category Intermediate Interventions: Electrolyte abnormality - evaluation and management  Jamie Bell 06/02/2014, 4:58 AM

## 2014-06-02 NOTE — Progress Notes (Signed)
PULMONARY / CRITICAL CARE MEDICINE   Name: Jamie Bell MRN: [id number] DOB: [birth year]    ADMISSION DATE:  05/30/2014  REFERRING MD :  EDP at [place]:  SOB  INITIAL PRESENTATION:   44 yo female smoker presented to [facility] 5/04 with dyspnea from pneumonia.  STUDIES:  5/6 renal u/s >> negative for hydronephrosis 5/6 Echo >> EF 50 to 40%, grade 1 diastolic dysfx  SIGNIFICANT EVENTS: 5/5 transfer from [facility], intubated, septic shock, paralytic started 5/7 d/c nimbex, off pressors  SUBJECTIVE:  Oxygen needs improved.  VITAL SIGNS: Temp:  [98.2 F (36.8 C)-100.2 F (37.9 C)] 99 F (37.2 C) (05/07 0756) Pulse Rate:  [68-101] 77 (05/07 0900) Resp:  [11-30] 30 (05/07 0900) BP: (95-130)/(57-77) 106/72 mmHg (05/07 0900) SpO2:  [96 %-100 %] 98 % (05/07 0900) FiO2 (%):  [40 %] 40 % (05/07 0900) Weight:  [217 lb 2.5 oz (98.5 kg)] 217 lb 2.5 oz (98.5 kg) (05/07 0240) HEMODYNAMICS: CVP:  [9 mmHg-18 mmHg] 13 mmHg VENTILATOR SETTINGS: Vent Mode:  [-] PRVC FiO2 (%):  [40 %] 40 % Set Rate:  [30 bmp] 30 bmp Vt Set:  [460 mL] 460 mL PEEP:  [10 cmH20] 10 cmH20 Plateau Pressure:  [10 cmH20-40 cmH20] 40 cmH20 INTAKE / OUTPUT: Intake/Output      05/06 0701 - 05/07 0700 05/07 0701 - 05/08 0700   I.V. (mL/kg) 3194.9 (32.4) 167.8 (1.7)   NG/GT 480 20   IV Piggyback 951 275   Total Intake(mL/kg) 4625.9 (47) 462.8 (4.7)   Urine (mL/kg/hr) 1870 (0.8) 75 (0.3)   Total Output 1870 75   Net +2755.9 +387.8          PHYSICAL EXAMINATION: General: paralyzed Neuro: RASS -5 HEENT: ETT in place Cardiovascular: regular Lungs: b/l crackles Lt > Rt Abdomen: soft   Musculoskeletal: 1+ edema Skin: no rashes  LABS:  CBC  Recent Labs Lab 05/31/14 0516 06/01/14 0403 06/02/14 0300  WBC 16.2* 11.3* 6.6  HGB 12.5 11.1* 10.8*  HCT 36.8 33.4* 33.1*  PLT 146* 147* 119*   BMET  Recent Labs Lab 05/31/14 0516 06/01/14 0403 06/02/14 0300  NA 134* 138 137  K 4.1 3.1* 3.1*   CL 103 99* 100*  CO2 18* 27 28  BUN 38* 31* 30*  CREATININE 2.12* 1.34* 1.03*  GLUCOSE 269* 143* 130*   Electrolytes  Recent Labs Lab 05/31/14 0516 06/01/14 0403 06/02/14 0300  CALCIUM 8.7* 8.3* 8.1*  MG 2.3 2.2 2.2  PHOS 4.2 2.3* 2.5   Sepsis Markers  Recent Labs Lab 05/31/14 0123 05/31/14 0516 06/01/14 0403 06/02/14 0300  LATICACIDVEN 3.6* 1.5  --   --   PROCALCITON  --  11.15 5.74 3.72   ABG  Recent Labs Lab 05/31/14 1126 06/01/14 0346 06/02/14 0257  PHART 7.268* 7.416 7.426  PCO2ART 46.8* 44.1 40.2  PO2ART 65.0* 124.0* 80.0   Liver Enzymes  Recent Labs Lab 05/30/14 2350  AST 130*  ALT 49  ALKPHOS 142*  BILITOT 0.6  ALBUMIN 4.0   Cardiac Enzymes  Recent Labs Lab 05/30/14 2350 05/31/14 0516  TROPONINI 0.14* 0.17*   Glucose  Recent Labs Lab 06/01/14 1152 06/01/14 1608 06/01/14 2040 06/02/14 0003 06/02/14 0415 06/02/14 0758  GLUCAP 148* 135* 138* 151* 124* 120*    Imaging US Renal  06/01/2014   CLINICAL DATA:  Renal failure  EXAM: RENAL / URINARY TRACT ULTRASOUND COMPLETE  COMPARISON:  None.  FINDINGS: Right Kidney:  Length: 12.0 cm. Echogenicity within normal limits.  No mass or hydronephrosis visualized.  Left Kidney:  Length: 12.6 cm. Echogenicity within normal limits. No mass or hydronephrosis visualized.  Bladder:  Decompressed around a Foley catheter and cannot be evaluated.  IMPRESSION: Negative for hydronephrosis.  No significant abnormality.   Electronically Signed   By: Andreas Newport M.D.   On: 06/01/2014 06:47   Dg Chest Port 1 View  06/01/2014   CLINICAL DATA:  Respiratory failure.  Diabetes and hypertension.  EXAM: PORTABLE CHEST - 1 VIEW  COMPARISON:  05/31/2014  FINDINGS: The endotracheal tube tip is 5 cm above the carina. The left jugular central line extends into the SVC. The nasogastric tube extends into the stomach and off the inferior edge of the image. There is improvement, with partial clearance of the bilateral  airspace consolidation. There is no large effusion.  IMPRESSION: Support equipment appears satisfactorily positioned.  Improved, with partial clearance of the diffuse airspace opacities. This may represent resolving alveolar edema.   Electronically Signed   By: Andreas Newport M.D.   On: 06/01/2014 06:30    ASSESSMENT / PLAN:  PULMONARY A: Acute hypoxemic respiratory failure 2nd to CAP and ARDS. Tobacco use disorder. P:   Full vent support F/u CXR, ABG PRN BD's D/c nimbex 5/07 Lasix 40 mg IV x one 5/07  CARDIOVASCULAR A:  Septic shock 2nd to CAP >> off pressors 5/07. Elevated troponin from demand ischemia. P:  Pressors to keep MAP  Goal CVP < 6  RENAL A:   AKI 2nd to sepsis >> resolved. Hypokalemia, hypophosphatemia >> improved. Non anion gap acidosis >> resolved. CKD stage 2. P:   Replace electrolytes as needed Monitor renal fx, urine outpt  GASTROINTESTINAL A:   Nutrition. P:   NPO Protonix Tube feeds  HEMATOLOGIC A:   Leukocytosis. P:  F/u CBC SQ heparin for DVT prevention  INFECTIOUS A:   Septic shock 2nd to CAP. P:   Day 4 of Abx, currently on zosyn, vancomycin  MRSA screen 5/05 >> positive Blood 5/05 >> Sputum 5/05 >> Legionella ag 5/06 >>  ENDOCRINE A:   DM type II with renal complications. P:   SSI Hold outpatient canagliflozin for now  NEUROLOGIC A:   Anxiety / Depression - not on meds. Chronic back pain, DDD, Sciatica. P:   Hold outpatient robaxin, oxycodone, excedrin migraine for now. Change RASS goal to -1 after 5 paralytic d/c'ed  CC time 35 minutes.  Chesley Mires, MD [facility] Pulmonary/Critical Care 06/02/2014, 9:38 AM Pager:  [phone] After 3pm call: [phone]

## 2014-06-02 NOTE — Progress Notes (Signed)
Sent pt's purse and cell phone home with pt's mother, Nalla Purdy.

## 2014-06-02 NOTE — Progress Notes (Signed)
[facility] ADULT ICU REPLACEMENT PROTOCOL FOR AM LAB REPLACEMENT ONLY  The patient does not apply for the [facility] Adult ICU Electrolyte Replacment Protocol based on the criteria listed below:   1. Is GFR >/= 40 ml/min? Yes.    Patient's GFR today is >60 2. Is urine output >/= 0.5 ml/kg/hr for the last 6 hours? No. Patient's UOP is 0.13 ml/kg/hr 3. Is BUN < 60 mg/dL? Yes.    Patient's BUN today is 30 4. Abnormal electrolyte(s): K+3.1 5. Ordered repletion with: NA 6. If a panic level lab has been reported, has the CCM MD in charge been notified? Yes.  .   Physician:  E Deterding  Concepcion Living [facility] 06/02/2014 4:53 AM

## 2014-06-03 ENCOUNTER — Inpatient Hospital Stay (HOSPITAL_COMMUNITY): Payer: Medicare Other

## 2014-06-03 DIAGNOSIS — A419 Sepsis, unspecified organism: Secondary | ICD-10-CM | POA: Diagnosis not present

## 2014-06-03 LAB — GLUCOSE, CAPILLARY
GLUCOSE-CAPILLARY: 106 mg/dL — AB (ref 70–99)
GLUCOSE-CAPILLARY: 113 mg/dL — AB (ref 70–99)
GLUCOSE-CAPILLARY: 141 mg/dL — AB (ref 70–99)
Glucose-Capillary: 116 mg/dL — ABNORMAL HIGH (ref 70–99)
Glucose-Capillary: 140 mg/dL — ABNORMAL HIGH (ref 70–99)
Glucose-Capillary: 106 mg/dL — ABNORMAL HIGH (ref 70–99)

## 2014-06-03 LAB — BASIC METABOLIC PANEL
ANION GAP: 8 (ref 5–15)
BUN: 32 mg/dL — ABNORMAL HIGH (ref 6–20)
CO2: 29 mmol/L (ref 22–32)
Calcium: 8.5 mg/dL — ABNORMAL LOW (ref 8.9–10.3)
Chloride: 105 mmol/L (ref 101–111)
Creatinine, Ser: 1.04 mg/dL — ABNORMAL HIGH (ref 0.44–1.00)
GFR calc Af Amer: >60 mL/min (ref >60)
Glucose, Bld: 102 mg/dL — ABNORMAL HIGH (ref 70–99)
Potassium: 3.6 mmol/L (ref 3.5–5.1)
SODIUM: 142 mmol/L (ref 135–145)

## 2014-06-03 LAB — BLOOD GAS, ARTERIAL
ACID-BASE EXCESS: 3.6 mmol/L — AB (ref 0.0–2.0)
BICARBONATE: 27.5 meq/L — AB (ref 20.0–24.0)
Drawn by: 398661
FIO2: 0.4 %
MECHVT: 400 mL
O2 Saturation: 94.6 %
PATIENT TEMPERATURE: 99.7
PEEP: 8 cmH2O
PH ART: 7.434 (ref 7.350–7.450)
RATE: 32 resp/min
TCO2: 28.8 mmol/L (ref 0–100)
pCO2 arterial: 42.1 mmHg (ref 35.0–45.0)
pO2, Arterial: 80.8 mmHg (ref 80.0–100.0)

## 2014-06-03 LAB — CBC
HCT: 34.1 % — ABNORMAL LOW (ref 36.0–46.0)
HEMOGLOBIN: 10.6 g/dL — AB (ref 12.0–15.0)
MCH: 27.7 pg (ref 26.0–34.0)
MCHC: 31.1 g/dL (ref 30.0–36.0)
MCV: 89 fL (ref 78.0–100.0)
Platelets: 115 10*3/uL — ABNORMAL LOW (ref 150–400)
RBC: 3.83 MIL/uL — ABNORMAL LOW (ref 3.87–5.11)
RDW: 16.6 % — AB (ref 11.5–15.5)
WBC: 5.5 10*3/uL (ref 4.0–10.5)

## 2014-06-03 MED ORDER — PANTOPRAZOLE SODIUM 40 MG IV SOLR
40.0000 mg | INTRAVENOUS | Status: DC
  Filled 2014-06-03: qty 40

## 2014-06-03 MED ORDER — METHOCARBAMOL 750 MG PO TABS
750.0000 mg | ORAL_TABLET | Freq: Four times a day (QID) | ORAL | Status: DC | PRN
  Filled 2014-06-03: qty 1

## 2014-06-03 MED ORDER — FUROSEMIDE 10 MG/ML IJ SOLN
40.0000 mg | Freq: Once | INTRAMUSCULAR | Status: AC
  Administered 2014-06-03: 40 mg via INTRAVENOUS
  Filled 2014-06-03: qty 4

## 2014-06-03 MED ORDER — INSULIN ASPART 100 UNIT/ML ~~LOC~~ SOLN
0.0000 [IU] | Freq: Every day | SUBCUTANEOUS | Status: DC
Start: 2014-06-03 — End: 2014-06-04

## 2014-06-03 MED ORDER — POTASSIUM CHLORIDE 20 MEQ/15ML (10%) PO SOLN
40.0000 meq | Freq: Once | ORAL | Status: DC
  Filled 2014-06-03: qty 30

## 2014-06-03 MED ORDER — FENTANYL CITRATE (PF) 100 MCG/2ML IJ SOLN
25.0000 ug | INTRAMUSCULAR | Status: DC | PRN
  Administered 2014-06-03: 100 ug via INTRAVENOUS
  Administered 2014-06-03 (×2): 50 ug via INTRAVENOUS
  Administered 2014-06-04 (×2): 100 ug via INTRAVENOUS
  Filled 2014-06-03 (×5): qty 2

## 2014-06-03 MED ORDER — NICOTINE 21 MG/24HR TD PT24
21.0000 mg | MEDICATED_PATCH | Freq: Every day | TRANSDERMAL | Status: DC
  Administered 2014-06-03 – 2014-06-04 (×2): 21 mg via TRANSDERMAL
  Filled 2014-06-03 (×2): qty 1

## 2014-06-03 MED ORDER — FENTANYL CITRATE (PF) 100 MCG/2ML IJ SOLN: 25.0000 ug | INTRAMUSCULAR | Status: DC | PRN

## 2014-06-03 MED ORDER — CETYLPYRIDINIUM CHLORIDE 0.05 % MT LIQD
7.0000 mL | Freq: Two times a day (BID) | OROMUCOSAL | Status: DC
  Administered 2014-06-03 – 2014-06-06 (×6): 7 mL via OROMUCOSAL

## 2014-06-03 MED ORDER — INSULIN ASPART 100 UNIT/ML ~~LOC~~ SOLN
0.0000 [IU] | Freq: Three times a day (TID) | SUBCUTANEOUS | Status: DC
Start: 2014-06-03 — End: 2014-06-04
  Administered 2014-06-03: 3 [IU] via SUBCUTANEOUS

## 2014-06-03 MED ORDER — OXYCODONE HCL 5 MG PO TABS
15.0000 mg | ORAL_TABLET | ORAL | Status: DC | PRN
  Administered 2014-06-04 – 2014-06-06 (×11): 15 mg via ORAL
  Filled 2014-06-03 (×11): qty 3

## 2014-06-03 NOTE — Progress Notes (Signed)
Wasted 180 cc of fentanyl gtt bag into the sink , witnessed by Particia Jasper, RN

## 2014-06-03 NOTE — Procedures (Signed)
Extubation Procedure Note  Patient Details:   Name: Jamie Bell DOB: May 08, 1970 MRN: [id number]   Airway Documentation:  Airway 7.5 mm (Active)  Secured at (cm) 24 cm 06/03/2014  8:07 AM  Measured From Lips 06/03/2014  8:07 AM  Johnson 06/03/2014  8:07 AM  Secured By [employer] 06/03/2014  8:07 AM  Tube Holder Repositioned Yes 06/03/2014  8:07 AM  Cuff Pressure (cm H2O) 26 cm H2O 06/03/2014  8:07 AM  Site Condition Dry 06/03/2014  8:07 AM  Extubated to 6lpm [place], sats 90, no distress noted at this time.  Evaluation  O2 sats: stable throughout Complications: No apparent complications Patient did tolerate procedure well. Bilateral Breath Sounds: Diminished Suctioning: Oral, Airway Yes  Donella Stade 06/03/2014, 11:04 AM

## 2014-06-03 NOTE — Progress Notes (Signed)
PULMONARY / CRITICAL CARE MEDICINE   Name: Jamie Bell MRN: [id number] DOB: Oct 10, 1970    ADMISSION DATE:  05/30/2014  REFERRING MD :  EDP at [place]:  SOB  INITIAL PRESENTATION:   44 yo female smoker presented to [facility] 5/04 with dyspnea from pneumonia.  STUDIES:  5/6 renal u/s >> negative for hydronephrosis 5/6 Echo >> EF 50 to 23%, grade 1 diastolic dysfx  SIGNIFICANT EVENTS: 5/5 transfer from [facility], intubated, septic shock, paralytic started 5/7 d/c nimbex, off pressors  SUBJECTIVE:  Oxygen needs improved.  VITAL SIGNS: Temp:  [99.2 F (37.3 C)-99.7 F (37.6 C)] 99.7 F (37.6 C) (05/08 0756) Pulse Rate:  [66-101] 74 (05/08 0700) Resp:  [9-32] 32 (05/08 0700) BP: (87-111)/(50-78) 96/54 mmHg (05/08 0807) SpO2:  [87 %-100 %] 98 % (05/08 0700) FiO2 (%):  [40 %] 40 % (05/08 0807) Weight:  [214 lb 15.2 oz (97.5 kg)] 214 lb 15.2 oz (97.5 kg) (05/08 0500) HEMODYNAMICS: CVP:  [13 mmHg] 13 mmHg VENTILATOR SETTINGS: Vent Mode:  [-] PRVC FiO2 (%):  [40 %] 40 % Set Rate:  [32 bmp] 32 bmp Vt Set:  [400 mL] 400 mL PEEP:  [5 cmH20-8 cmH20] 5 cmH20 Plateau Pressure:  [21 cmH20-31 cmH20] 21 cmH20 INTAKE / OUTPUT: Intake/Output      05/07 0701 - 05/08 0700 05/08 0701 - 05/09 0700   I.V. (mL/kg) 1658.2 (17)    NG/GT 360    IV Piggyback 650    Total Intake(mL/kg) 2668.2 (27.4)    Urine (mL/kg/hr) 3610 (1.5) 300 (0.9)   Total Output 3610 300   Net -941.9 -300          PHYSICAL EXAMINATION: General: agitated at times, pulling at ETT Neuro: RASS +1 HEENT: ETT in place Cardiovascular: regular Lungs: b/l crackles Lt > Rt Abdomen: soft   Musculoskeletal: 1+ edema Skin: no rashes  LABS:  CBC  Recent Labs Lab 06/01/14 0403 06/02/14 0300 06/03/14 0510  WBC 11.3* 6.6 5.5  HGB 11.1* 10.8* 10.6*  HCT 33.4* 33.1* 34.1*  PLT 147* 119* 115*   BMET  Recent Labs Lab 06/01/14 0403 06/02/14 0300 06/03/14 0510  NA 138 137 142  K 3.1* 3.1* 3.6  CL  99* 100* 105  CO2 27 28 29   BUN 31* 30* 32*  CREATININE 1.34* 1.03* 1.04*  GLUCOSE 143* 130* 102*   Electrolytes  Recent Labs Lab 05/31/14 0516 06/01/14 0403 06/02/14 0300 06/03/14 0510  CALCIUM 8.7* 8.3* 8.1* 8.5*  MG 2.3 2.2 2.2  --   PHOS 4.2 2.3* 2.5  --    Sepsis Markers  Recent Labs Lab 05/31/14 0123 05/31/14 0516 06/01/14 0403 06/02/14 0300  LATICACIDVEN 3.6* 1.5  --   --   PROCALCITON  --  11.15 5.74 3.72   ABG  Recent Labs Lab 06/01/14 0346 06/02/14 0257 06/03/14 0500  PHART 7.416 7.426 7.434  PCO2ART 44.1 40.2 42.1  PO2ART 124.0* 80.0 80.8   Liver Enzymes  Recent Labs Lab 05/30/14 2350  AST 130*  ALT 49  ALKPHOS 142*  BILITOT 0.6  ALBUMIN 4.0   Cardiac Enzymes  Recent Labs Lab 05/30/14 2350 05/31/14 0516  TROPONINI 0.14* 0.17*   Glucose  Recent Labs Lab 06/02/14 1258 06/02/14 1607 06/02/14 2003 06/03/14 0013 06/03/14 0434 06/03/14 0752  GLUCAP 138* 126* 128* 141* 113* 106*    Imaging Dg Chest Port 1 View  06/02/2014   CLINICAL DATA:  Community acquired pneumonia.  Intubated patient.  EXAM: PORTABLE CHEST -  1 VIEW  COMPARISON:  06/01/2014  FINDINGS: Endotracheal tube tip projects 4 cm above the Carina. Nasogastric tube passes below the diaphragm into stomach. Left internal jugular central venous line catheter tip is in the mid superior vena cava.  Patchy airspace opacity in the left mid and lower lung has mildly improved. There is persistent mild interstitial thickening bilaterally and additional medial lung base opacity that is likely due to atelectasis. This stable.  No pneumothorax.  IMPRESSION: 1. Since the previous day's study, there has been mild further clearing in airspace opacities in the left mid and lower lung. No other change. 2. Support apparatus is stable and well positioned.   Electronically Signed   By: Lajean Manes M.D.   On: 06/02/2014 07:10    ASSESSMENT / PLAN:  PULMONARY A: Acute hypoxemic respiratory  failure 2nd to CAP and ARDS. Tobacco use disorder. P:   Pressure support wean as tolerated >> might be ready for extubation soon F/u CXR PRN BD's Lasix 40 mg IV x one 5/08  CARDIOVASCULAR A:  Septic shock 2nd to CAP >> off pressors 5/07. Elevated troponin from demand ischemia. P:  Goal CVP < 6  RENAL A:   AKI 2nd to sepsis >> resolved. Hypokalemia, hypophosphatemia >> improved. Non anion gap acidosis >> resolved. CKD stage 2. P:   Replace electrolytes as needed Monitor renal fx, urine outpt  GASTROINTESTINAL A:   Nutrition. P:   NPO Protonix Tube feeds  HEMATOLOGIC A:   Leukocytosis. P:  F/u CBC SQ heparin for DVT prevention  INFECTIOUS A:   Septic shock 2nd to CAP. P:   Day 5 of Abx, currently on zosyn, vancomycin  MRSA screen 5/05 >> positive Blood 5/05 >> Sputum 5/05 >> Legionella ag 5/06 >>  ENDOCRINE A:   DM type II with renal complications. P:   SSI Hold outpatient canagliflozin for now  NEUROLOGIC A:   Anxiety / Depression - not on meds. Chronic back pain, DDD, Sciatica. P:   Hold outpatient robaxin, oxycodone, excedrin migraine for now. Change RASS goal to -1  CC time 35 minutes.  Chesley Mires, MD [facility] Pulmonary/Critical Care 06/03/2014, 10:32 AM Pager:  [phone] After 3pm call: [phone]

## 2014-06-03 NOTE — Progress Notes (Signed)
[place] Progress Note Patient Name: Jamie Bell DOB: 10-24-1970 MRN: [id number]   Date of Service  06/03/2014  HPI/Events of Note  Patient has history of tobacco use and requests a nicotine patch.  eICU Interventions  Will order a nicotine patch.      Intervention Category Minor Interventions: Routine modifications to care plan (e.g. PRN medications for pain, fever)  Sommer,Steven Eugene 06/03/2014, 9:20 PM

## 2014-06-04 ENCOUNTER — Inpatient Hospital Stay (HOSPITAL_COMMUNITY): Payer: Medicare Other

## 2014-06-04 DIAGNOSIS — R0602 Shortness of breath: Secondary | ICD-10-CM

## 2014-06-04 DIAGNOSIS — E1121 Type 2 diabetes mellitus with diabetic nephropathy: Secondary | ICD-10-CM

## 2014-06-04 LAB — BASIC METABOLIC PANEL
Anion gap: 14 (ref 5–15)
BUN: 32 mg/dL — ABNORMAL HIGH (ref 6–20)
CO2: 24 mmol/L (ref 22–32)
Calcium: 9 mg/dL (ref 8.9–10.3)
Chloride: 104 mmol/L (ref 101–111)
Creatinine, Ser: 1 mg/dL (ref 0.44–1.00)
GFR calc Af Amer: >60 mL/min (ref >60)
Glucose, Bld: 98 mg/dL (ref 70–99)
Potassium: 3.7 mmol/L (ref 3.5–5.1)
SODIUM: 142 mmol/L (ref 135–145)

## 2014-06-04 LAB — LEGIONELLA ANTIGEN, URINE

## 2014-06-04 LAB — BRAIN NATRIURETIC PEPTIDE: B Natriuretic Peptide: 264.5 pg/mL — ABNORMAL HIGH (ref 0.0–100.0)

## 2014-06-04 LAB — GLUCOSE, CAPILLARY
GLUCOSE-CAPILLARY: 112 mg/dL — AB (ref 70–99)
Glucose-Capillary: 106 mg/dL — ABNORMAL HIGH (ref 70–99)
Glucose-Capillary: 127 mg/dL — ABNORMAL HIGH (ref 70–99)
Glucose-Capillary: 159 mg/dL — ABNORMAL HIGH (ref 70–99)

## 2014-06-04 LAB — CBC
HCT: 37.2 % (ref 36.0–46.0)
HEMOGLOBIN: 12 g/dL (ref 12.0–15.0)
MCH: 28.4 pg (ref 26.0–34.0)
MCHC: 32.3 g/dL (ref 30.0–36.0)
MCV: 88.2 fL (ref 78.0–100.0)
Platelets: 125 10*3/uL — ABNORMAL LOW (ref 150–400)
RBC: 4.22 MIL/uL (ref 3.87–5.11)
RDW: 15.8 % — ABNORMAL HIGH (ref 11.5–15.5)
WBC: 7 10*3/uL (ref 4.0–10.5)

## 2014-06-04 MED ORDER — PANTOPRAZOLE SODIUM 40 MG PO TBEC
40.0000 mg | DELAYED_RELEASE_TABLET | Freq: Every day | ORAL | Status: DC
  Administered 2014-06-04 – 2014-06-05 (×2): 40 mg via ORAL
  Filled 2014-06-04 (×2): qty 1

## 2014-06-04 MED ORDER — INSULIN ASPART 100 UNIT/ML ~~LOC~~ SOLN: 0.0000 [IU] | Freq: Every day | SUBCUTANEOUS | Status: DC

## 2014-06-04 MED ORDER — DOXYCYCLINE HYCLATE 100 MG PO TABS
100.0000 mg | ORAL_TABLET | Freq: Two times a day (BID) | ORAL | Status: DC
  Administered 2014-06-04 – 2014-06-05 (×3): 100 mg via ORAL
  Filled 2014-06-04 (×5): qty 1

## 2014-06-04 MED ORDER — INSULIN ASPART 100 UNIT/ML ~~LOC~~ SOLN
0.0000 [IU] | Freq: Three times a day (TID) | SUBCUTANEOUS | Status: DC
  Administered 2014-06-04 – 2014-06-05 (×4): 2 [IU] via SUBCUTANEOUS

## 2014-06-04 MED ORDER — ENOXAPARIN SODIUM 40 MG/0.4ML ~~LOC~~ SOLN
40.0000 mg | SUBCUTANEOUS | Status: DC
  Administered 2014-06-04 – 2014-06-05 (×2): 40 mg via SUBCUTANEOUS
  Filled 2014-06-04 (×3): qty 0.4

## 2014-06-04 NOTE — Progress Notes (Addendum)
ANTIBIOTIC CONSULT NOTE - FOLLOW UP  Pharmacy Consult:  Zosyn Indication: pneumonia  Allergies  Allergen Reactions  . Ciprofloxacin Swelling  . Nsaids Hives   Patient Measurements: Height: 5' 5"  (165.1 cm) Weight: 201 lb 11.5 oz (91.5 kg) IBW/kg (Calculated) : 57   Vital Signs: Temp: 98.2 F (36.8 C) (05/09 0419) Temp Source: Oral (05/09 0419) BP: 106/63 mmHg (05/09 0700) Pulse Rate: 67 (05/09 0700) Intake/Output from previous day: 05/08 0701 - 05/09 0700 In: 800 [I.V.:120; NG/GT:30; IV Piggyback:650] Out: 5465 [Urine:5465]  Labs:  Recent Labs  06/02/14 0300 06/03/14 0510 06/04/14 0622  WBC 6.6 5.5 7.0  HGB 10.8* 10.6* 12.0  PLT 119* 115* 125*  CREATININE 1.03* 1.04* 1.00   Estimated Creatinine Clearance: 81.9 mL/min (by C-G formula based on Cr of 1). No results for input(s): VANCOTROUGH, VANCOPEAK, VANCORANDOM, GENTTROUGH, GENTPEAK, GENTRANDOM, TOBRATROUGH, TOBRAPEAK, TOBRARND, AMIKACINPEAK, AMIKACINTROU, AMIKACIN in the last 72 hours.   Microbiology: Recent Results (from the past 720 hour(s))  Blood culture (routine x 2)     Status: None (Preliminary result)   Collection Time: 05/31/14  1:23 AM  Result Value Ref Range Status   Specimen Description BLOOD LEFT ANTECUBITAL  Final   Special Requests   Final    BOTTLES DRAWN AEROBIC AND ANAEROBIC AEB=6CC ANA=8CC   Culture NO GROWTH 3 DAYS  Final   Report Status PENDING  Incomplete  Blood culture (routine x 2)     Status: None (Preliminary result)   Collection Time: 05/31/14  1:40 AM  Result Value Ref Range Status   Specimen Description BLOOD LEFT HAND  Final   Special Requests   Final    BOTTLES DRAWN AEROBIC AND ANAEROBIC AEB=8CC ANA=6CC   Culture NO GROWTH 3 DAYS  Final   Report Status PENDING  Incomplete  MRSA PCR Screening     Status: Abnormal   Collection Time: 05/31/14  4:36 AM  Result Value Ref Range Status   MRSA by PCR POSITIVE (A) NEGATIVE Final    Comment:        The GeneXpert MRSA Assay  (FDA approved for NASAL specimens only), is one component of a comprehensive MRSA colonization surveillance program. It is not intended to diagnose MRSA infection nor to guide or monitor treatment for MRSA infections. RESULT CALLED TO, READ BACK BY AND VERIFIED WITH: NOTIFIED PIKE,K RN @ [phone] 05/31/14 YARBROUGH,S        Assessment: 31 YOF with history of IVDU and MRSA bacteremia to continue on Zosyn and change from vancomycin to doxycycline for CAP.  Patient's renal function has been stable.  CXR showed improvement in pulmonary infiltrates.  CTX 5/4 >> 5/5 Azith 5/4 >> 5/5 Vanc 5/5 >> 5/9 Zosyn 5/5 >> Doxy 5/9 >>  5/4 BCx x2 - ngtd MRSA PCR - positive   Goal of Therapy:  Resolution of infection   Plan:  - Continue Zosyn 3.375g IV Q8H, 4hr infusion - Doxy 1107m PO BID per MD - Monitor renal fxn, clinical progress - F/U pain control    Kristi Hyer D. DMina Marble PharmD, BCPS Pager:  [phone]/09/2014, 10:38 AM

## 2014-06-04 NOTE — Progress Notes (Signed)
PULMONARY / CRITICAL CARE MEDICINE   Name: Jamie Bell MRN: [id number] DOB: 1970-09-08    ADMISSION DATE:  05/30/2014  REFERRING MD :  EDP at [place]:  SOB  INITIAL PRESENTATION:   44 yo female smoker presented to [facility] 5/04 with dyspnea from pneumonia.  STUDIES:  5/6 renal u/s >> negative for hydronephrosis 5/6 Echo >> EF 50 to 00%, grade 1 diastolic dysfx  SIGNIFICANT EVENTS: 5/5 transfer from [facility], intubated, septic shock, paralytic started 5/7 d/c nimbex, off pressors 5/08 Extubated 5/09 Transfer to med-surg ordered. TRH to assume care as of 5/10  SUBJECTIVE:  No new complaints. No distress  VITAL SIGNS: Temp:  [98.2 F (36.8 C)-99.5 F (37.5 C)] 98.8 F (37.1 C) (05/09 1120) Pulse Rate:  [67-88] 80 (05/09 1100) Resp:  [0-33] 20 (05/09 1100) BP: (96-139)/(46-88) 121/64 mmHg (05/09 1100) SpO2:  [92 %-98 %] 93 % (05/09 1100) Weight:  [91.5 kg (201 lb 11.5 oz)] 91.5 kg (201 lb 11.5 oz) (05/09 0600) HEMODYNAMICS: CVP:  [5 mmHg-7 mmHg] 7 mmHg VENTILATOR SETTINGS:   INTAKE / OUTPUT: Intake/Output      05/08 0701 - 05/09 0700 05/09 0701 - 05/10 0700   I.V. (mL/kg) 120 (1.3) 20 (0.2)   NG/GT 30    IV Piggyback 650 250   Total Intake(mL/kg) 800 (8.7) 270 (3)   Urine (mL/kg/hr) 5465 (2.5) 305 (0.4)   Total Output 5465 305   Net -4665 -35          PHYSICAL EXAMINATION: General: NAD Neuro: no focal deficits HEENT: WNL Cardiovascular: Reg, no M Lungs: minimal bibasilar crackles Abdomen: soft, + BS Ext: trace edema  LABS:  CBC  Recent Labs Lab 06/02/14 0300 06/03/14 0510 06/04/14 0622  WBC 6.6 5.5 7.0  HGB 10.8* 10.6* 12.0  HCT 33.1* 34.1* 37.2  PLT 119* 115* 125*   BMET  Recent Labs Lab 06/02/14 0300 06/03/14 0510 06/04/14 0622  NA 137 142 142  K 3.1* 3.6 3.7  CL 100* 105 104  CO2 28 29 24   BUN 30* 32* 32*  CREATININE 1.03* 1.04* 1.00  GLUCOSE 130* 102* 98   Electrolytes  Recent Labs Lab 05/31/14 0516  06/01/14 0403 06/02/14 0300 06/03/14 0510 06/04/14 0622  CALCIUM 8.7* 8.3* 8.1* 8.5* 9.0  MG 2.3 2.2 2.2  --   --   PHOS 4.2 2.3* 2.5  --   --    Sepsis Markers  Recent Labs Lab 05/31/14 0123 05/31/14 0516 06/01/14 0403 06/02/14 0300  LATICACIDVEN 3.6* 1.5  --   --   PROCALCITON  --  11.15 5.74 3.72   ABG  Recent Labs Lab 06/01/14 0346 06/02/14 0257 06/03/14 0500  PHART 7.416 7.426 7.434  PCO2ART 44.1 40.2 42.1  PO2ART 124.0* 80.0 80.8   Liver Enzymes  Recent Labs Lab 05/30/14 2350  AST 130*  ALT 49  ALKPHOS 142*  BILITOT 0.6  ALBUMIN 4.0   Cardiac Enzymes  Recent Labs Lab 05/30/14 2350 05/31/14 0516  TROPONINI 0.14* 0.17*   Glucose  Recent Labs Lab 06/03/14 0752 06/03/14 1246 06/03/14 1649 06/03/14 2239 06/04/14 0802 06/04/14 1118  GLUCAP 106* 116* 140* 106* 106* 112*    Imaging Dg Chest Port 1 View  06/03/2014   CLINICAL DATA:  Adult respiratory distress syndrome.  Followup exam.  EXAM: PORTABLE CHEST - 1 VIEW  COMPARISON:  06/02/2014  FINDINGS: Endotracheal tube, left internal jugular central venous line and nasogastric tube are stable.  There is persistent airspace opacity in  the lung bases , greater on the right, and left perihilar region, without significant change from the prior study when allowing for differences in patient positioning and technique.  Cardiac silhouette is normal in size. No mediastinal or hilar masses. No pneumothorax.  IMPRESSION: 1. No significant change from the most recent prior exam. 2. Residual airspace opacities.  No new lung abnormalities. 3. Support apparatus is stable and well positioned.   Electronically Signed   By: Lajean Manes M.D.   On: 06/03/2014 07:56    ASSESSMENT / PLAN:  PULMONARY A: Acute hypoxemic respiratory failure Pulmonary infiltrates - improved with diuresis Smoker P:   Supp O2 as needed to maintain SpO2 > 90% CXR 5/10 AM  CARDIOVASCULAR A:  Shock, resolved Elevated troponin from  demand ischemia P:  Monitor hemodynamics Recheck BNP, Echo 5/09  RENAL A:   AKI, resolved CKD stage 2. Hypokalemia, resolved Hypophos, resolved Non anion gap acidosis, resolved P:   Monitor BMET intermittently Monitor I/Os Correct electrolytes as indicated  GASTROINTESTINAL A:   No issues P:   CHO mod diet ordered 5/09  HEMATOLOGIC A:   Mild anemia without acute blood loss Mild thrombocytopenia P:  DVT px: SQ LMWH Monitor CBC intermittently Transfuse per usual ICU guidelines  INFECTIOUS A:   Severe sepsis  Suspected PNA, NOS - resolving Flouroquinolone allergy P:   Vanc 5/04 >> 5/09 Pip-taz 5/04 >> 5/09 Doxycycline 5/09 >> (3 days ordered)  MRSA screen 5/05 >> positive Blood 5/05 >> NEG Sputum 5/05 >> NOF Strep Ag 5/06 >> NEG Legionella ag 5/06 >> NEG  ENDOCRINE A:   DM type II with renal complications P:   SSI changed to [place] outpatient canagliflozin for now  NEUROLOGIC A:   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 TRH to assume 5/10 and PCCM to sign off  Merton Border, MD ; [facility] [phone].  After 5:30 PM or weekends, call [phone]

## 2014-06-04 NOTE — Progress Notes (Signed)
*  PRELIMINARY RESULTS* Echocardiogram 2D Echocardiogram has been performed.  Leavy Cella 06/04/2014, 12:09 PM

## 2014-06-05 DIAGNOSIS — F411 Generalized anxiety disorder: Secondary | ICD-10-CM

## 2014-06-05 DIAGNOSIS — N189 Chronic kidney disease, unspecified: Secondary | ICD-10-CM

## 2014-06-05 DIAGNOSIS — F329 Major depressive disorder, single episode, unspecified: Secondary | ICD-10-CM

## 2014-06-05 DIAGNOSIS — Z720 Tobacco use: Secondary | ICD-10-CM | POA: Diagnosis present

## 2014-06-05 DIAGNOSIS — I2489 Other forms of acute ischemic heart disease: Secondary | ICD-10-CM | POA: Diagnosis present

## 2014-06-05 DIAGNOSIS — G8929 Other chronic pain: Secondary | ICD-10-CM

## 2014-06-05 DIAGNOSIS — I248 Other forms of acute ischemic heart disease: Secondary | ICD-10-CM | POA: Diagnosis present

## 2014-06-05 DIAGNOSIS — M549 Dorsalgia, unspecified: Secondary | ICD-10-CM

## 2014-06-05 DIAGNOSIS — L03113 Cellulitis of right upper limb: Secondary | ICD-10-CM

## 2014-06-05 DIAGNOSIS — A419 Sepsis, unspecified organism: Principal | ICD-10-CM

## 2014-06-05 DIAGNOSIS — E1122 Type 2 diabetes mellitus with diabetic chronic kidney disease: Secondary | ICD-10-CM

## 2014-06-05 DIAGNOSIS — J189 Pneumonia, unspecified organism: Secondary | ICD-10-CM

## 2014-06-05 DIAGNOSIS — R6520 Severe sepsis without septic shock: Secondary | ICD-10-CM

## 2014-06-05 DIAGNOSIS — F32A Depression, unspecified: Secondary | ICD-10-CM | POA: Diagnosis present

## 2014-06-05 LAB — CBC
HCT: 41.1 % (ref 36.0–46.0)
HEMOGLOBIN: 13 g/dL (ref 12.0–15.0)
MCH: 27.8 pg (ref 26.0–34.0)
MCHC: 31.6 g/dL (ref 30.0–36.0)
MCV: 88 fL (ref 78.0–100.0)
Platelets: 136 10*3/uL — ABNORMAL LOW (ref 150–400)
RBC: 4.67 MIL/uL (ref 3.87–5.11)
RDW: 15.7 % — ABNORMAL HIGH (ref 11.5–15.5)
WBC: 7 10*3/uL (ref 4.0–10.5)

## 2014-06-05 LAB — LIPID PANEL
CHOL/HDL RATIO: 9.7 ratio
CHOLESTEROL: 175 mg/dL (ref 0–200)
HDL: 18 mg/dL — AB (ref >40)
LDL Cholesterol: 90 mg/dL (ref 0–99)
TRIGLYCERIDES: 333 mg/dL — AB (ref <150)
VLDL: 67 mg/dL — ABNORMAL HIGH (ref 0–40)

## 2014-06-05 LAB — BASIC METABOLIC PANEL
Anion gap: 12 (ref 5–15)
BUN: 26 mg/dL — ABNORMAL HIGH (ref 6–20)
CO2: 21 mmol/L — AB (ref 22–32)
CREATININE: 0.82 mg/dL (ref 0.44–1.00)
Calcium: 9.3 mg/dL (ref 8.9–10.3)
Chloride: 106 mmol/L (ref 101–111)
GFR calc Af Amer: >60 mL/min (ref >60)
GFR calc non Af Amer: >60 mL/min (ref >60)
Glucose, Bld: 144 mg/dL — ABNORMAL HIGH (ref 70–99)
POTASSIUM: 3.7 mmol/L (ref 3.5–5.1)
Sodium: 139 mmol/L (ref 135–145)

## 2014-06-05 LAB — CULTURE, BLOOD (ROUTINE X 2)
CULTURE: NO GROWTH
Culture: NO GROWTH

## 2014-06-05 LAB — GLUCOSE, CAPILLARY
GLUCOSE-CAPILLARY: 150 mg/dL — AB (ref 70–99)
GLUCOSE-CAPILLARY: 126 mg/dL — AB (ref 70–99)
GLUCOSE-CAPILLARY: 148 mg/dL — AB (ref 70–99)
Glucose-Capillary: 136 mg/dL — ABNORMAL HIGH (ref 70–99)

## 2014-06-05 MED ORDER — MUPIROCIN 2 % EX OINT
1.0000 "application " | TOPICAL_OINTMENT | Freq: Two times a day (BID) | CUTANEOUS | Status: DC
  Administered 2014-06-05 – 2014-06-06 (×3): 1 via NASAL
  Filled 2014-06-05: qty 22

## 2014-06-05 MED ORDER — CHLORHEXIDINE GLUCONATE CLOTH 2 % EX PADS
6.0000 | MEDICATED_PAD | Freq: Every day | CUTANEOUS | Status: DC
  Administered 2014-06-05 – 2014-06-06 (×2): 6 via TOPICAL

## 2014-06-05 MED ORDER — SULFAMETHOXAZOLE-TRIMETHOPRIM 800-160 MG PO TABS
2.0000 | ORAL_TABLET | Freq: Two times a day (BID) | ORAL | Status: DC
  Administered 2014-06-05 – 2014-06-06 (×2): 2 via ORAL
  Filled 2014-06-05 (×3): qty 2

## 2014-06-05 MED ORDER — BUPROPION HCL ER (SR) 150 MG PO TB12
150.0000 mg | ORAL_TABLET | Freq: Two times a day (BID) | ORAL | Status: DC
  Administered 2014-06-05 – 2014-06-06 (×2): 150 mg via ORAL
  Filled 2014-06-05 (×3): qty 1

## 2014-06-05 NOTE — Evaluation (Signed)
Clinical/Bedside Swallow Evaluation Patient Details  Name: Jamie Bell MRN: [id number] Date of Birth: 05/21/1970  Today's Date: 06/05/2014 Time: SLP Start Time (ACUTE ONLY): 81 SLP Stop Time (ACUTE ONLY): 0928 SLP Time Calculation (min) (ACUTE ONLY): 8 min  Past Medical History:  Past Medical History  Diagnosis Date  . Diabetes mellitus   . Chronic back pain   . PCOS (polycystic ovarian syndrome)   . Depression   . DDD (degenerative disc disease)   . Decreased hearing on the right.    F/u by ENT in the past and no further management.  Marland Kitchen GERD (gastroesophageal reflux disease)   . Sciatica     HNP- Cerv. 7- T2  . MRSA bacteremia 2014    Tx with Vancomycin  . Hypertension     took med. for HTN, several yrs. ago, no longer needs , seen by Dr. Lattie Haw last yr. during a hosp. at [facility], for MRSA bacteremia    Past Surgical History:  Past Surgical History  Procedure Laterality Date  . Back surgery    . Cholecystectomy    . Neck surgery    . Carpal tunnel release    . Tee without cardioversion N/A 08/09/2012    Procedure: TRANSESOPHAGEAL ECHOCARDIOGRAM (TEE);  Surgeon: [name], MD;  Location: [facility];  Service: Cardiovascular;  Laterality: N/A;  . Peripherally inserted central catheter insertion Right 2007    for treatment with Vancomycin   . Breast surgery Left     blocked milk ducts   . Posterior cervical laminectomy N/A 11/22/2013    Procedure: POSTERIOR CERVICAL LAMINECTOMY C7/T1, T/1-2;  Surgeon: [name], MD;  Location: [place] [facility] OR[facility];  Service: Neurosurgery;  Laterality: N/A;  POSTERIOR CERVICAL LAMINECTOMY C7/T1, T/1-2   HPI:  44 yo female smoker presented to [facility] 5/04 with dyspnea from pneumonia. Pt intubated 5/5 to 5/8. Observed to cough with thin liquids.   Assessment / Plan / Recommendation Clinical Impression  Pt demonstrates No signs of aspiration with thin liquids. Report of coughing with liquids is resolving, as is hoarse vocal quality  indicative of improving airway protection following 3 day intubation. Recommended basic aspiration precautions which pt return demonstrated. No diet modification or f/u necessary.     Aspiration Risk  Mild    Diet Recommendation  (regular/thin)   Compensations: Small sips/bites    Other  Recommendations Oral Care Recommendations: Patient independent with oral care   Follow Up Recommendations       Frequency and Duration        Pertinent Vitals/Pain NA    SLP Swallow Goals     Swallow Study Prior Functional Status       General Other Pertinent Information: 44 yo female smoker presented to [facility] 5/04 with dyspnea from pneumonia. Pt intubated 5/5 to 5/8. Observed to cough with thin liquids. Type of Study: Bedside swallow evaluation Diet Prior to this Study: Regular;Thin liquids Temperature Spikes Noted: No Respiratory Status: Room air History of Recent Intubation: Yes Length of Intubations (days): 3 days Date extubated: 06/03/14 Behavior/Cognition: Alert;Cooperative;Pleasant mood Oral Cavity - Dentition: Adequate natural dentition/normal for age Self-Feeding Abilities: Able to feed self Patient Positioning: Other (comment) (edge of bed) Baseline Vocal Quality: Hoarse Volitional Cough: Strong Volitional Swallow: Able to elicit    Oral/Motor/Sensory Function Overall Oral Motor/Sensory Function: Appears within functional limits for tasks assessed   Ice Chips     Thin Liquid Thin Liquid: Within functional limits Presentation: Cup;Straw;Self Fed    Nectar Thick Nectar  Thick Liquid: Not tested   Honey Thick Honey Thick Liquid: Not tested   Puree Puree: Within functional limits   Solid   GO    Solid: Not tested      Herbie Baltimore, MA CCC-SLP [phone]  [name] 06/05/2014,9:32 AM

## 2014-06-05 NOTE — Evaluation (Signed)
Physical Therapy Evaluation Patient Details Name: Jamie Bell MRN: [id number] DOB: 06/15/1970 Today's Date: 06/05/2014   History of Present Illness  Patient is a 44 y/o female admitted with ARDS/PNA on vent when transferred from [facility] on 5/5, extubated 5/8.  Diabetes mellitus; Chronic back pain; PCOS (polycystic ovarian syndrome); Depression; DDD (degenerative disc disease); PMH positive for Decreased hearing (on the right.); GERD (gastroesophageal reflux disease); Sciatica; MRSA bacteremia (2014); and Hypertension. Past surgical history positive for Back surgery; Cholecystectomy; Neck surgery; Carpal tunnel release; TEE without cardioversion (N/A, 08/09/2012); Peripherally inserted central catheter insertion (Right, 2007); Breast surgery (Left); and Posterior cervical laminectomy   Clinical Impression  Patient presents with decreased mobility due to deficits listed in PT problem list.  She will benefit from skilled PT in the acute setting to maximize mobility; ambulation and stairs for d/c home.  Do not feel needs follow up PT or any DME for d/c home with family assist.    Follow Up Recommendations No PT follow up    Equipment Recommendations  None recommended by PT    Recommendations for Other Services       Precautions / Restrictions Precautions Precautions: None      Mobility  Bed Mobility Overal bed mobility: Modified Independent                Transfers Overall transfer level: Modified independent Equipment used: None                Ambulation/Gait Ambulation/Gait assistance: Min guard Ambulation Distance (Feet): 300 Feet Assistive device: None Gait Pattern/deviations: Step-through pattern;Decreased stride length;Decreased dorsiflexion - left;Decreased dorsiflexion - right;Wide base of support     General Gait Details: slow and stiff like hasn't walked in a while; states walking to bathroom in room unaided, but usually pushes IV pole; gait little more  steady with increased distance, but then c/o thigh soreness  Stairs            Wheelchair Mobility    Modified Rankin (Stroke Patients Only)       Balance Overall balance assessment: Needs assistance         Standing balance support: No upper extremity supported Standing balance-Leahy Scale: Good Standing balance comment: stands safely and ambulates with limited UE support okay, but imbalance evident                             Pertinent Vitals/Pain Pain Assessment: 0-10 Pain Score: 7  Pain Location: back pain    Home Living Family/patient expects to be discharged to:: Private residence Living Arrangements: Parent Available Help at Discharge: Family Type of Home: House Home Access: Stairs to enter Entrance Stairs-Rails: None Technical brewer of Steps: 3 Home Layout: One level Home Equipment: None      Prior Function Level of Independence: Independent               Hand Dominance        Extremity/Trunk Assessment   Upper Extremity Assessment: Overall WFL for tasks assessed           Lower Extremity Assessment: RLE deficits/detail;LLE deficits/detail RLE Deficits / Details: AROM WFL except ankle DF limited to few degrees from neutral; strength grossly 4/5 LLE Deficits / Details: AROM WFL except ankle DF limited to few degrees from neutral; strength grossly 4/5     Communication   Communication: No difficulties  Cognition Arousal/Alertness: Awake/alert Behavior During Therapy: WFL for tasks assessed/performed Overall Cognitive Status:  Within Functional Limits for tasks assessed                      General Comments General comments (skin integrity, edema, etc.): noted right hand with redness and swelling on dorsum from IV pt reports happened at [facility]    Exercises        Assessment/Plan    PT Assessment Patient needs continued PT services  PT Diagnosis Generalized weakness;Abnormality of gait   PT Problem  List Decreased strength;Decreased activity tolerance;Decreased mobility;Decreased balance  PT Treatment Interventions DME instruction;Balance training;Gait training;Stair training;Functional mobility training;Therapeutic activities;Therapeutic exercise   PT Goals (Current goals can be found in the Care Plan section) Acute Rehab PT Goals Patient Stated Goal: To go home PT Goal Formulation: With patient Time For Goal Achievement: 06/08/14 Potential to Achieve Goals: Good    Frequency Min 3X/week   Barriers to discharge        Co-evaluation               End of Session Equipment Utilized During Treatment: Gait belt Activity Tolerance: Patient tolerated treatment well Patient left: in bed;with call bell/phone within reach           Time: 1010-1030 PT Time Calculation (min) (ACUTE ONLY): 20 min   Charges:   PT Evaluation $Initial PT Evaluation Tier I: 1 Procedure     PT G Codes:        WYNN,CYNDI 2014-06-22, 12:44 PM  Magda Kiel, [place] Jun 22, 2014

## 2014-06-05 NOTE — Progress Notes (Signed)
Pt right hand is red, warm and swollen at old infiltration site. Pt states that "it has gotten worse from yesterday" MD Sherral Hammers aware.

## 2014-06-05 NOTE — Progress Notes (Signed)
Jamie Bell VHQ:[id number] DOB: 04-08-1970 DOA: 05/30/2014 PCP: Alonza Bogus, MD  Admit HPI / Brief Narrative: 44 y.o. WF PMHx Depression, Anxiety, diabetes type 2, chronic back pain, sciatica (S/P neck/back surgeries), HTN, PCOS, brought to [facility] ED 5/4 for SOB. In ED, was significantly hypoxic and remained so despite NRB. She was transferred to [facility] for further management due to concerns that she may require intubation.   HPI/Subjective: 5/10 A/O 4, only complaint is of very painful and red right hand.  Assessment/Plan: Acute hypoxemic respiratory failure -Resolved -Ambulatory SPO2 pending -Titrate O2 to maintain SPO2 89-93%  Elevated troponin from demand ischemia -Echocardiogram shows mild diastolic dysfunction see results below  Acute on chronic kidney failure/CKD stage 2. -Normalized kidney function  Mild anemia without acute blood loss/Mild thrombocytopenia -Workup as outpatient   Severe sepsis//pneumonia  -Resolved completely course antibiotics  DM type II with renal complications -Moderate SSI -Hemoglobin A1c pending -Lipid panel pending  Anxiety / Depression  -Patient currently not anxious would not treat, could address with PCP if required  -Patient started on Wellbutrin which will cover depression  Chronic back pain, DDD, Sciatica Cont oxycodone PRN PT eval ordered 5/09  Cellulitis right hand -Patient MRSA positive by swab on admission. Start Bactrim DS BID per guidelines  Tobacco abuse -Patient highly motivated to stop smoking -Start Wellbutrin 150 mg BID -Please ensure through the CSW that patient's insurance will pay for medication   Code Status: FULL Family Communication: no family present at time of exam Disposition Plan: Discharge in a.m.    Consultants: Clovis Fredrickson [facility] Ed M)  Procedure/Significant Events: 5/6 renal u/s >> negative for hydronephrosis 5/6 Echo >> EF 50  to 41%, grade 1 diastolic dysfx   Culture MRSA screen 5/05 >> positive Blood 5/05 >> NEG Sputum 5/05 >> NOF Strep Ag 5/06 >> NEG Legionella ag 5/06 >> NEG  Antibiotics: Vanc 5/04 >> 5/09 Pip-taz 5/04 >> 5/09 Doxycycline 5/09 >> stopped 5/10 Bactrim DS 5/10>>   DVT prophylaxis: Lovenox   Devices    LINES / TUBES:      Continuous Infusions: . sodium chloride 10 mL/hr at 06/02/14 1350    Objective: VITAL SIGNS: Temp: 98.4 F (36.9 C) (05/10 1404) Temp Source: Oral (05/10 1404) BP: 126/78 mmHg (05/10 1404) Pulse Rate: 67 (05/10 1404) SPO2; FIO2:   Intake/Output Summary (Last 24 hours) at 06/05/14 2005 Last data filed at 06/05/14 1825  Gross per 24 hour  Intake    640 ml  Output    400 ml  Net    240 ml     Exam: General: A/O 4, NAD, No acute respiratory distress Lungs: Clear to auscultation bilaterally without wheezes or crackles Cardiovascular: Regular rate and rhythm without murmur gallop or rub normal S1 and S2 Abdomen: Nontender, nondistended, soft, bowel sounds positive, no rebound, no ascites, no appreciable mass Extremities: Right hand erythema, tenderness to palpation, warm to touch  Data Reviewed: Basic Metabolic Panel:  Recent Labs Lab 05/31/14 0516 06/01/14 0403 06/02/14 0300 06/03/14 0510 06/04/14 0622 06/05/14 0614  NA 134* 138 137 142 142 139  K 4.1 3.1* 3.1* 3.6 3.7 3.7  CL 103 99* 100* 105 104 106  CO2 18* 27 28 29 24  21*  GLUCOSE 269* 143* 130* 102* 98 144*  BUN 38* 31* 30* 32* 32* 26*  CREATININE 2.12* 1.34* 1.03* 1.04* 1.00 0.82  CALCIUM 8.7* 8.3* 8.1* 8.5* 9.0 9.3  MG 2.3 2.2 2.2  --   --   --  PHOS 4.2 2.3* 2.5  --   --   --    Liver Function Tests:  Recent Labs Lab 05/30/14 2350  AST 130*  ALT 49  ALKPHOS 142*  BILITOT 0.6  PROT 8.4*  ALBUMIN 4.0   No results for input(s): LIPASE, AMYLASE in the last 168 hours. No results for input(s): AMMONIA in the last 168 hours. CBC:  Recent Labs Lab  05/30/14 2350  06/01/14 0403 06/02/14 0300 06/03/14 0510 06/04/14 0622 06/05/14 0614  WBC 24.3*  < > 11.3* 6.6 5.5 7.0 7.0  NEUTROABS 22.7*  --   --   --   --   --   --   HGB 13.4  < > 11.1* 10.8* 10.6* 12.0 13.0  HCT 39.9  < > 33.4* 33.1* 34.1* 37.2 41.1  MCV 86.4  < > 87.2 88.3 89.0 88.2 88.0  PLT 183  < > 147* 119* 115* 125* 136*  < > = values in this interval not displayed. Cardiac Enzymes:  Recent Labs Lab 05/30/14 2350 05/31/14 0516  TROPONINI 0.14* 0.17*   BNP (last 3 results)  Recent Labs  05/30/14 2350 06/04/14 1041  BNP 272.0* 264.5*    ProBNP (last 3 results) No results for input(s): PROBNP in the last 8760 hours.  CBG:  Recent Labs Lab 06/04/14 1637 06/04/14 2129 06/05/14 0825 06/05/14 1155 06/05/14 1647  GLUCAP 127* 159* 136* 150* 126*    Recent Results (from the past 240 hour(s))  Blood culture (routine x 2)     Status: None   Collection Time: 05/31/14  1:23 AM  Result Value Ref Range Status   Specimen Description BLOOD LEFT ANTECUBITAL  Final   Special Requests   Final    BOTTLES DRAWN AEROBIC AND ANAEROBIC AEB=6CC ANA=8CC   Culture NO GROWTH 5 DAYS  Final   Report Status 06/05/2014 FINAL  Final  Blood culture (routine x 2)     Status: None   Collection Time: 05/31/14  1:40 AM  Result Value Ref Range Status   Specimen Description BLOOD LEFT HAND  Final   Special Requests   Final    BOTTLES DRAWN AEROBIC AND ANAEROBIC AEB=8CC ANA=6CC   Culture NO GROWTH 5 DAYS  Final   Report Status 06/05/2014 FINAL  Final  MRSA PCR Screening     Status: Abnormal   Collection Time: 05/31/14  4:36 AM  Result Value Ref Range Status   MRSA by PCR POSITIVE (A) NEGATIVE Final    Comment:        The GeneXpert MRSA Assay (FDA approved for NASAL specimens only), is one component of a comprehensive MRSA colonization surveillance program. It is not intended to diagnose MRSA infection nor to guide or monitor treatment for MRSA infections. RESULT CALLED  TO, READ BACK BY AND VERIFIED WITH: NOTIFIED PIKE,K RN @ [phone] 05/31/14 YARBROUGH,S      Studies:  Recent x-ray studies have been reviewed in detail by the Attending Physician  Scheduled Meds:  Scheduled Meds: . antiseptic oral rinse  7 mL Mouth Rinse BID  . buPROPion  150 mg Oral BID  . Chlorhexidine Gluconate Cloth  6 each Topical Q0600  . enoxaparin (LOVENOX) injection  40 mg Subcutaneous Q24H  . insulin aspart  0-15 Units Subcutaneous TID WC  . insulin aspart  0-5 Units Subcutaneous QHS  . mupirocin ointment  1 application Nasal BID  . pantoprazole  40 mg Oral Q1200  . sulfamethoxazole-trimethoprim  2 tablet Oral Q12H    Time spent  on care of this patient: 40 mins   Korrin Waterfield, Geraldo Docker , MD  [facility] Office  [phone] Pager [phone]  On-Call/Text Page:      [email or web address]      password [email or web address]  If 7PM-7AM, please contact night-coverage [email or web address] Password [email or web address] 06/05/2014, 8:05 PM   LOS: 5 days   Care during the described time interval was provided by me .  I have reviewed this patient's available data, including medical history, events of note, physical examination, radiology studies and test results as part of my evaluation  Dia Crawford, MD [phone] Pager

## 2014-06-05 NOTE — Progress Notes (Signed)
Medicare Important Message given?  YES (If response is "NO", the following Medicare IM given date fields will be blank) Date Medicare IM given:  06/05/14 Medicare IM given by:  Tomi Bamberger

## 2014-06-05 NOTE — Consult Note (Signed)
   [facility] CM Inpatient Consult   06/05/2014  Jamie Bell 05/01/1970 [id number]   Patient evaluated for [place] [facility] services.Came to bedside to explain and offer [facility] services.She pleasantly declined and accepted [facility] Care Management brochure to call in future in case she changes her mind. Made inpatient RNCM aware.  Marthenia Rolling, MSN-Ed, RN,BSN [facility] Liaison [phone]

## 2014-06-05 NOTE — Progress Notes (Signed)
Utilization review completed.  

## 2014-06-06 LAB — HEMOGLOBIN A1C
HEMOGLOBIN A1C: 8.1 % — AB (ref 4.8–5.6)
Mean Plasma Glucose: 186 mg/dL

## 2014-06-06 LAB — GLUCOSE, CAPILLARY
Glucose-Capillary: 137 mg/dL — ABNORMAL HIGH (ref 70–99)
Glucose-Capillary: 145 mg/dL — ABNORMAL HIGH (ref 70–99)

## 2014-06-06 MED ORDER — OXYCODONE HCL 15 MG PO TABS: 15.0000 mg | ORAL_TABLET | ORAL | Status: DC | PRN

## 2014-06-06 MED ORDER — BUPROPION HCL ER (SR) 150 MG PO TB12: 150.0000 mg | ORAL_TABLET | Freq: Two times a day (BID) | ORAL | Status: DC

## 2014-06-06 MED ORDER — SULFAMETHOXAZOLE-TRIMETHOPRIM 800-160 MG PO TABS: 2.0000 | ORAL_TABLET | Freq: Two times a day (BID) | ORAL | Status: DC

## 2014-06-06 NOTE — Discharge Summary (Signed)
Physician Discharge Summary  ARDINE Bell WYO:[id number] DOB: Sep 13, 1970 DOA: 05/30/2014  PCP: Jamie Bogus, MD  Admit date: 05/30/2014 Discharge date: 06/06/2014  Time spent: 35 minutes  Recommendations for Outpatient Follow-up:  Follow up resolution of right hand cellulitis  Discharge Diagnoses:  Active Problems:   Hypoxia   Respiratory failure, acute   Acute renal failure syndrome   Demand ischemia of myocardium   Severe sepsis   Sepsis due to pneumonia   Type 2 diabetes mellitus with diabetic chronic kidney disease   Anxiety state   Depression   Chronic back pain   Cellulitis of right hand   Tobacco abuse   Discharge Condition: Stable.   Diet recommendation: Heart Healthy  Filed Weights   06/02/14 0240 06/03/14 0500 06/04/14 0600  Weight: 98.5 kg (217 lb 2.5 oz) 97.5 kg (214 lb 15.2 oz) 91.5 kg (201 lb 11.5 oz)    History of present illness:  Jamie Bell is a 44 y.o. F with PMH as outlined below. She presented to [facility] ED 5/4 with SOB. She reported that she had a panic attack earlier that morning around 9:30 AM and since then had been experiencing SOB. She felt to be in her USOH prior to that. She denied any fevers, chills, sweats, chest pain, cough, N/V/D, abdominal pain. Has had some back and leg discomfort but she states that these are chronic problems for her. She has not had any recent travel or exposure to known sick contacts.  In ED, CXR revealed bilateral infiltrates and she remained hypoxic despite supplemental O2 via [place] and NRB. She was given lasix and breathing treatments without any improvement in her respiratory status. She was treated empirically for CAP and was transferred to [facility] for further evaluation and management. En route to [facility], she was placed on BiPAP due to persistent hypoxia.  On arrival to [facility], she feels that her breathing is improved though she is not back to her baseline yet. Currently denies any chest pain, difficulty  breathing. She realizes the importance of continuing with BiPAP in an effort to spare her from possible intubation. She is currently comfortable on BiPAP after receiving 85m Ativan en route.  She is an active smoker with a 30 pack year history. PFT's from 2014 with no obstruction (ratio 80).   Of note, on her exam, she has multiple bruises that appear as tract marks on bilateral UE's. She adamantly denies any illicit drug use.  Hospital Course:  Admit HPI / Brief Narrative: 44y.o. WF PMHx Depression, Anxiety, diabetes type 2, chronic back pain, sciatica (S/P neck/back surgeries), HTN, PCOS, brought to [facility] ED 5/4 for SOB. In ED, was significantly hypoxic and remained so despite NRB. [facility] concerns that [facility] may require [facility]ubation.    Assessment/Plan: Acute hypoxemic respiratory failure -Resolved -Ambulatory SPO2 pending -Titrate O2 to maintain SPO2 89-93%  Elevated troponin from demand ischemia -Echocardiogram shows mild diastolic dysfunction see results below  Acute on chronic kidney failure/CKD stage 2. -Normalized kidney function  Mild anemia without acute blood loss/Mild thrombocytopenia -Workup as outpatient   Severe sepsis//pneumonia  -Resolved completely course antibiotics  DM type II with renal complications -Moderate SSI -Hemoglobin A1c pending -Lipid panel pending  Anxiety / Depression  -Patient currently not anxious would not treat, could address with PCP if required  -Patient started on Wellbutrin which will cover depression  Chronic back pain, DDD, Sciatica Cont oxycodone PRN PT eval ordered 5/09  Cellulitis  right hand -Patient MRSA positive by swab on admission. Start Bactrim DS BID per guidelines  Tobacco abuse -Patient highly motivated to stop smoking -Started on Wellbutrin 150 mg BID  Procedures:  none  Consultations:  CCM  Discharge Exam: Filed Vitals:   06/06/14 0502  BP: 122/75   Pulse: 65  Temp: 98.4 F (36.9 C)  Resp: 18    General: NAD Cardiovascular: S 1, S 2 RRR Respiratory: CTA  Discharge Instructions   Discharge Instructions    Diet - low sodium heart healthy    Complete by:  As directed      Increase activity slowly    Complete by:  As directed           Current Discharge Medication List    START taking these medications   Details  buPROPion (WELLBUTRIN SR) 150 MG 12 hr tablet Take 1 tablet (150 mg total) by mouth 2 (two) times daily. Qty: 60 tablet, Refills: 0    sulfamethoxazole-trimethoprim (BACTRIM DS,SEPTRA DS) 800-160 MG per tablet Take 2 tablets by mouth every 12 (twelve) hours. Qty: 14 tablet, Refills: 0      CONTINUE these medications which have CHANGED   Details  oxyCODONE (ROXICODONE) 15 MG immediate release tablet Take 1 tablet (15 mg total) by mouth every 4 (four) hours as needed for pain. Qty: 6 tablet, Refills: 0      CONTINUE these medications which have NOT CHANGED   Details  acetaminophen (TYLENOL) 500 MG tablet Take 1,000 mg by mouth every 6 (six) hours as needed for mild pain.     aspirin-acetaminophen-caffeine (EXCEDRIN MIGRAINE) 250-250-65 MG per tablet Take by mouth every 6 (six) hours as needed for headache.    Canagliflozin (INVOKANA) 100 MG TABS Take 150 mg by mouth daily before breakfast.     methocarbamol (ROBAXIN-750) 750 MG tablet Take 1 tablet (750 mg total) by mouth every 6 (six) hours as needed for muscle spasms. Qty: 60 tablet, Refills: 1    omeprazole (PRILOSEC) 20 MG capsule Take 20 mg by mouth daily as needed (heartburn).      STOP taking these medications     pantoprazole (PROTONIX) 40 MG tablet        Allergies  Allergen Reactions  . Ciprofloxacin Swelling  . Nsaids Hives   Follow-up Information    Follow up with HAWKINS,EDWARD L, MD In 1 week.   Specialty:  Pulmonary Disease   Contact information:   [address] [place] [address] [phone]         The results of significant diagnostics from this hospitalization (including imaging, microbiology, ancillary and laboratory) are listed below for reference.    Significant Diagnostic Studies: US Renal  06/01/2014   CLINICAL DATA:  Renal failure  EXAM: RENAL / URINARY TRACT ULTRASOUND COMPLETE  COMPARISON:  None.  FINDINGS: Right Kidney:  Length: 12.0 cm. Echogenicity within normal limits. No mass or hydronephrosis visualized.  Left Kidney:  Length: 12.6 cm. Echogenicity within normal limits. No mass or hydronephrosis visualized.  Bladder:  Decompressed around a Foley catheter and cannot be evaluated.  IMPRESSION: Negative for hydronephrosis.  No significant abnormality.   Electronically Signed   By: Andreas Newport M.D.   On: 06/01/2014 06:47   Dg Chest Port 1 View  06/04/2014   CLINICAL DATA:  Pneumonia .  EXAM: PORTABLE CHEST - 1 VIEW  COMPARISON:  06/03/2014 .  FINDINGS: Interim removal of endotracheal tube and NG tube. Left IJ line in stable position .  Cardiomegaly. Interim slight improvement of bilateral pulmonary infiltrates. No pleural effusion or pneumothorax. Prior cervical spine fusion.  IMPRESSION: 1. Interim removal of endotracheal tube and NG tube. Left IJ line in stable position. 2. Interim slight improvement of bilateral pulmonary infiltrates.   Electronically Signed   By: Marcello Moores  Register   On: 06/04/2014 07:38   Dg Chest Port 1 View  06/03/2014   CLINICAL DATA:  Adult respiratory distress syndrome.  Followup exam.  EXAM: PORTABLE CHEST - 1 VIEW  COMPARISON:  06/02/2014  FINDINGS: Endotracheal tube, left internal jugular central venous line and nasogastric tube are stable.  There is persistent airspace opacity in the lung bases , greater on the right, and left perihilar region, without significant change from the prior study when allowing for differences in patient positioning and technique.  Cardiac silhouette is normal in size. No mediastinal or hilar masses. No pneumothorax.   IMPRESSION: 1. No significant change from the most recent prior exam. 2. Residual airspace opacities.  No new lung abnormalities. 3. Support apparatus is stable and well positioned.   Electronically Signed   By: Lajean Manes M.D.   On: 06/03/2014 07:56   Dg Chest Port 1 View  06/02/2014   CLINICAL DATA:  Community acquired pneumonia.  Intubated patient.  EXAM: PORTABLE CHEST - 1 VIEW  COMPARISON:  06/01/2014  FINDINGS: Endotracheal tube tip projects 4 cm above the Carina. Nasogastric tube passes below the diaphragm into stomach. Left internal jugular central venous line catheter tip is in the mid superior vena cava.  Patchy airspace opacity in the left mid and lower lung has mildly improved. There is persistent mild interstitial thickening bilaterally and additional medial lung base opacity that is likely due to atelectasis. This stable.  No pneumothorax.  IMPRESSION: 1. Since the previous day's study, there has been mild further clearing in airspace opacities in the left mid and lower lung. No other change. 2. Support apparatus is stable and well positioned.   Electronically Signed   By: Lajean Manes M.D.   On: 06/02/2014 07:10   Dg Chest Port 1 View  06/01/2014   CLINICAL DATA:  Respiratory failure.  Diabetes and hypertension.  EXAM: PORTABLE CHEST - 1 VIEW  COMPARISON:  05/31/2014  FINDINGS: The endotracheal tube tip is 5 cm above the carina. The left jugular central line extends into the SVC. The nasogastric tube extends into the stomach and off the inferior edge of the image. There is improvement, with partial clearance of the bilateral airspace consolidation. There is no large effusion.  IMPRESSION: Support equipment appears satisfactorily positioned.  Improved, with partial clearance of the diffuse airspace opacities. This may represent resolving alveolar edema.   Electronically Signed   By: Andreas Newport M.D.   On: 06/01/2014 06:30   Dg Chest Port 1 View  05/31/2014   CLINICAL DATA:   Hypoxia/respiratory failure  EXAM: PORTABLE CHEST - 1 VIEW  COMPARISON:  May 30, 2014  FINDINGS: Endotracheal tube tip is 3.9 cm above the carina. Central catheter tip is in the superior vena cava. Nasogastric tube tip and side port are below the diaphragm. No pneumothorax. There is widespread airspace consolidation throughout both lungs which is slightly more prominent in the right upper lobe and stable elsewhere compared to 1 day prior. Heart size and pulmonary vascularity are normal. No adenopathy appreciable. There is postoperative change in the lower cervical spine.  IMPRESSION: Tube and catheter positions as described without pneumothorax. Widespread airspace disease bilaterally, slightly increased in the  right upper lobe and stable elsewhere. Suspect widespread pneumonia, although congestive heart failure potentially could present in this manner. Both entities may exist concurrently.   Electronically Signed   By: Lowella Grip III M.D.   On: 05/31/2014 09:12   Dg Chest Port 1 View  05/31/2014   CLINICAL DATA:  Severe dyspnea and hypoxia  EXAM: PORTABLE CHEST - 1 VIEW  COMPARISON:  08/08/2012  FINDINGS: There is airspace consolidation in the central and basilar regions bilaterally. This may represent infectious infiltrate. Parenchymal hemorrhage could produce this appearance. Alveolar edema is less likely. No large effusions are evident.  IMPRESSION: Bilateral airspace consolidation in the central and basilar regions.   Electronically Signed   By: Andreas Newport M.D.   On: 05/31/2014 00:16   Dg Abd Portable 1v  05/31/2014   CLINICAL DATA:  Assess nasogastric tube positioning  EXAM: PORTABLE ABDOMEN - 1 VIEW  COMPARISON:  None.  FINDINGS: There is an esophagogastric tube present. The proximal port and the tip lie below the level of the GE junction in the region of the proximal fundus. The bowel gas pattern is nonspecific. There surgical clips in the gallbladder fossa. There is gentle dextrocurvature  of the lumbar spine and there is multilevel degenerative disc disease.  IMPRESSION: There is appropriate positioning of the nasogastric tube tip and proximal port below the level of the GE junction.   Electronically Signed   By: David  Martinique M.D.   On: 05/31/2014 09:17    Microbiology: Recent Results (from the past 240 hour(s))  Blood culture (routine x 2)     Status: None   Collection Time: 05/31/14  1:23 AM  Result Value Ref Range Status   Specimen Description BLOOD LEFT ANTECUBITAL  Final   Special Requests   Final    BOTTLES DRAWN AEROBIC AND ANAEROBIC AEB=6CC ANA=8CC   Culture NO GROWTH 5 DAYS  Final   Report Status 06/05/2014 FINAL  Final  Blood culture (routine x 2)     Status: None   Collection Time: 05/31/14  1:40 AM  Result Value Ref Range Status   Specimen Description BLOOD LEFT HAND  Final   Special Requests   Final    BOTTLES DRAWN AEROBIC AND ANAEROBIC AEB=8CC ANA=6CC   Culture NO GROWTH 5 DAYS  Final   Report Status 06/05/2014 FINAL  Final  MRSA PCR Screening     Status: Abnormal   Collection Time: 05/31/14  4:36 AM  Result Value Ref Range Status   MRSA by PCR POSITIVE (A) NEGATIVE Final    Comment:        The GeneXpert MRSA Assay (FDA approved for NASAL specimens only), is one component of a comprehensive MRSA colonization surveillance program. It is not intended to diagnose MRSA infection nor to guide or monitor treatment for MRSA infections. RESULT CALLED TO, READ BACK BY AND VERIFIED WITH: NOTIFIED PIKE,K RN @ 0805 05/31/14 YARBROUGH,S      Labs: Basic Metabolic Panel:  Recent Labs Lab 05/31/14 0516 06/01/14 0403 06/02/14 0300 06/03/14 0510 06/04/14 0622 06/05/14 0614  NA 134* 138 137 142 142 139  K 4.1 3.1* 3.1* 3.6 3.7 3.7  CL 103 99* 100* 105 104 106  CO2 18* 27 28 29 24  21*  GLUCOSE 269* 143* 130* 102* 98 144*  BUN 38* 31* 30* 32* 32* 26*  CREATININE 2.12* 1.34* 1.03* 1.04* 1.00 0.82  CALCIUM 8.7* 8.3* 8.1* 8.5* 9.0 9.3  MG 2.3 2.2 2.2   --   --   --  PHOS 4.2 2.3* 2.5  --   --   --    Liver Function Tests:  Recent Labs Lab 05/30/14 2350  AST 130*  ALT 49  ALKPHOS 142*  BILITOT 0.6  PROT 8.4*  ALBUMIN 4.0   No results for input(s): LIPASE, AMYLASE in the last 168 hours. No results for input(s): AMMONIA in the last 168 hours. CBC:  Recent Labs Lab 05/30/14 2350  06/01/14 0403 06/02/14 0300 06/03/14 0510 06/04/14 0622 06/05/14 0614  WBC 24.3*  < > 11.3* 6.6 5.5 7.0 7.0  NEUTROABS 22.7*  --   --   --   --   --   --   HGB 13.4  < > 11.1* 10.8* 10.6* 12.0 13.0  HCT 39.9  < > 33.4* 33.1* 34.1* 37.2 41.1  MCV 86.4  < > 87.2 88.3 89.0 88.2 88.0  PLT 183  < > 147* 119* 115* 125* 136*  < > = values in this interval not displayed. Cardiac Enzymes:  Recent Labs Lab 05/30/14 2350 05/31/14 0516  TROPONINI 0.14* 0.17*   BNP: BNP (last 3 results)  Recent Labs  05/30/14 2350 06/04/14 1041  BNP 272.0* 264.5*    ProBNP (last 3 results) No results for input(s): PROBNP in the last 8760 hours.  CBG:  Recent Labs Lab 06/04/14 2129 06/05/14 0825 06/05/14 1155 06/05/14 1647 06/05/14 2210  GLUCAP 159* 136* 150* 126* 148*       Signed:  Teruko Joswick A  [facility] 06/06/2014, 8:08 AM

## 2014-06-06 NOTE — Progress Notes (Signed)
Pt received discharge instruction with family in room, iv dc'd, vitals stable. Pt ambulating in steady gait. 06/06/2014 1:29 PM Dariann Huckaba

## 2014-06-21 DIAGNOSIS — M542 Cervicalgia: Secondary | ICD-10-CM | POA: Diagnosis not present

## 2014-06-21 DIAGNOSIS — M5023 Other cervical disc displacement, cervicothoracic region: Secondary | ICD-10-CM | POA: Diagnosis not present

## 2014-06-21 DIAGNOSIS — Z6833 Body mass index (BMI) 33.0-33.9, adult: Secondary | ICD-10-CM | POA: Diagnosis not present

## 2014-06-29 DIAGNOSIS — E1165 Type 2 diabetes mellitus with hyperglycemia: Secondary | ICD-10-CM | POA: Diagnosis not present

## 2014-06-29 DIAGNOSIS — J449 Chronic obstructive pulmonary disease, unspecified: Secondary | ICD-10-CM | POA: Diagnosis not present

## 2014-06-29 DIAGNOSIS — I10 Essential (primary) hypertension: Secondary | ICD-10-CM | POA: Diagnosis not present

## 2014-06-29 DIAGNOSIS — M545 Low back pain: Secondary | ICD-10-CM | POA: Diagnosis not present

## 2014-08-21 ENCOUNTER — Other Ambulatory Visit (HOSPITAL_COMMUNITY): Payer: Self-pay | Admitting: Pulmonary Disease

## 2014-08-21 DIAGNOSIS — E1165 Type 2 diabetes mellitus with hyperglycemia: Secondary | ICD-10-CM | POA: Diagnosis not present

## 2014-08-21 DIAGNOSIS — N644 Mastodynia: Secondary | ICD-10-CM

## 2014-08-21 DIAGNOSIS — M5440 Lumbago with sciatica, unspecified side: Secondary | ICD-10-CM | POA: Diagnosis not present

## 2014-08-21 DIAGNOSIS — J449 Chronic obstructive pulmonary disease, unspecified: Secondary | ICD-10-CM | POA: Diagnosis not present

## 2014-09-04 ENCOUNTER — Encounter (HOSPITAL_COMMUNITY): Payer: Medicare Other

## 2014-09-11 ENCOUNTER — Encounter (HOSPITAL_COMMUNITY): Payer: Medicare Other

## 2014-10-02 ENCOUNTER — Ambulatory Visit (HOSPITAL_COMMUNITY)
Admission: RE | Admit: 2014-10-02 | Discharge: 2014-10-02 | Disposition: A | Discharge status: Home / Self Care | Payer: Medicare Other | Source: Ambulatory Visit | Attending: Pulmonary Disease | Admitting: Pulmonary Disease

## 2014-10-02 ENCOUNTER — Other Ambulatory Visit (HOSPITAL_COMMUNITY): Payer: Self-pay | Admitting: Pulmonary Disease

## 2014-10-02 DIAGNOSIS — N644 Mastodynia: Secondary | ICD-10-CM

## 2014-10-02 DIAGNOSIS — D242 Benign neoplasm of left breast: Secondary | ICD-10-CM | POA: Insufficient documentation

## 2014-10-02 DIAGNOSIS — N63 Unspecified lump in breast: Secondary | ICD-10-CM | POA: Diagnosis not present

## 2014-10-23 DIAGNOSIS — E119 Type 2 diabetes mellitus without complications: Secondary | ICD-10-CM | POA: Diagnosis not present

## 2014-10-26 DIAGNOSIS — M545 Low back pain: Secondary | ICD-10-CM | POA: Diagnosis not present

## 2014-10-26 DIAGNOSIS — E1165 Type 2 diabetes mellitus with hyperglycemia: Secondary | ICD-10-CM | POA: Diagnosis not present

## 2014-10-26 DIAGNOSIS — J449 Chronic obstructive pulmonary disease, unspecified: Secondary | ICD-10-CM | POA: Diagnosis not present

## 2014-10-26 DIAGNOSIS — I10 Essential (primary) hypertension: Secondary | ICD-10-CM | POA: Diagnosis not present

## 2014-10-26 DIAGNOSIS — Z79891 Long term (current) use of opiate analgesic: Secondary | ICD-10-CM | POA: Diagnosis not present

## 2014-11-20 DIAGNOSIS — H25041 Posterior subcapsular polar age-related cataract, right eye: Secondary | ICD-10-CM | POA: Diagnosis not present

## 2014-11-20 DIAGNOSIS — H25042 Posterior subcapsular polar age-related cataract, left eye: Secondary | ICD-10-CM | POA: Diagnosis not present

## 2014-12-31 DIAGNOSIS — H25042 Posterior subcapsular polar age-related cataract, left eye: Secondary | ICD-10-CM | POA: Diagnosis not present

## 2014-12-31 DIAGNOSIS — H25043 Posterior subcapsular polar age-related cataract, bilateral: Secondary | ICD-10-CM | POA: Diagnosis not present

## 2015-01-29 DIAGNOSIS — J441 Chronic obstructive pulmonary disease with (acute) exacerbation: Secondary | ICD-10-CM | POA: Diagnosis not present

## 2015-01-29 DIAGNOSIS — E119 Type 2 diabetes mellitus without complications: Secondary | ICD-10-CM | POA: Diagnosis not present

## 2015-01-29 DIAGNOSIS — M5137 Other intervertebral disc degeneration, lumbosacral region: Secondary | ICD-10-CM | POA: Diagnosis not present

## 2015-01-29 DIAGNOSIS — I10 Essential (primary) hypertension: Secondary | ICD-10-CM | POA: Diagnosis not present

## 2015-01-29 NOTE — Patient Instructions (Signed)
Jamie Bell  01/29/2015     @PREFPERIOPPHARMACY @   Your procedure is scheduled on 02/04/2015.  Report to [facility] at 6:30 A.M.  Call this number if you have problems the morning of surgery:  [phone]   Remember:  Do not eat food or drink liquids after midnight.  Take these medicines the morning of surgery with A SIP OF WATER Prilosec or Protonix, Oxycontin if needed, Spiriva   Do not wear jewelry, make-up or nail polish.  Do not wear lotions, powders, or perfumes.  You may wear deodorant.  Do not shave 48 hours prior to surgery.  Men may shave face and neck.  Do not bring valuables to the hospital.  [facility] is not responsible for any belongings or valuables.  Contacts, dentures or bridgework may not be worn into surgery.  Leave your suitcase in the car.  After surgery it may be brought to your room.  For patients admitted to the hospital, discharge time will be determined by your treatment team.  Patients discharged the day of surgery will not be allowed to drive home.    Please read over the following fact sheets that you were given. Anesthesia Post-op Instructions     PATIENT INSTRUCTIONS POST-ANESTHESIA  IMMEDIATELY FOLLOWING SURGERY:  Do not drive or operate machinery for the first twenty four hours after surgery.  Do not make any important decisions for twenty four hours after surgery or while taking narcotic pain medications or sedatives.  If you develop intractable nausea and vomiting or a severe headache please notify your doctor immediately.  FOLLOW-UP:  Please make an appointment with your surgeon as instructed. You do not need to follow up with anesthesia unless specifically instructed to do so.  WOUND CARE INSTRUCTIONS (if applicable):  Keep a dry clean dressing on the anesthesia/puncture wound site if there is drainage.  Once the wound has quit draining you may leave it open to air.  Generally you should leave the bandage intact for twenty four  hours unless there is drainage.  If the epidural site drains for more than 36-48 hours please call the anesthesia department.  QUESTIONS?:  Please feel free to call your physician or the hospital operator if you have any questions, and they will be happy to assist you.       A cataract is a clouding of the lens of the eye. When a lens becomes cloudy, vision is reduced based on the degree and nature of the clouding. Surgery may be needed to improve vision. Surgery removes the cloudy lens and usually replaces it with a substitute lens (intraocular lens, IOL). LET YOUR EYE DOCTOR KNOW ABOUT:  Allergies to food or medicine.  Medicines taken including herbs, eye drops, over-the-counter medicines, and creams.  Use of steroids (by mouth or creams).  Previous problems with anesthetics or numbing medicine.  History of bleeding problems or blood clots.  Previous surgery.  Other health problems, including diabetes and kidney problems.  Possibility of pregnancy, if this applies. RISKS AND COMPLICATIONS  Infection.  Inflammation of the eyeball (endophthalmitis) that can spread to both eyes (sympathetic ophthalmia).  Poor wound healing.  If an IOL is inserted, it can later fall out of proper position. This is very uncommon.  Clouding of the part of your eye that holds an IOL in place. This is called an "after-cataract." These are uncommon but easily treated. BEFORE THE PROCEDURE  Do not eat or drink anything except small amounts of water for 8  to 12 before your surgery, or as directed by your caregiver.  Unless you are told otherwise, continue any eye drops you have been prescribed.  Talk to your primary caregiver about all other medicines that you take (both prescription and nonprescription). In some cases, you may need to stop or change medicines near the time of your surgery. This is most important if you are taking blood-thinning medicine.Do not stop medicines unless you are told to  do so.  Arrange for someone to drive you to and from the procedure.  Do not put contact lenses in either eye on the day of your surgery. PROCEDURE There is more than one method for safely removing a cataract. Your doctor can explain the differences and help determine which is best for you. Phacoemulsification surgery is the most common form of cataract surgery.  An injection is given behind the eye or eye drops are given to make this a painless procedure.  A small cut (incision) is made on the edge of the clear, dome-shaped surface that covers the front of the eye (cornea).  A tiny probe is painlessly inserted into the eye. This device gives off ultrasound waves that soften and break up the cloudy center of the lens. This makes it easier for the cloudy lens to be removed by suction.  An IOL may be implanted.  The normal lens of the eye is covered by a clear capsule. Part of that capsule is intentionally left in the eye to support the IOL.  Your surgeon may or may not use stitches to close the incision. There are other forms of cataract surgery that require a larger incision and stitches to close the eye. This approach is taken in cases where the doctor feels that the cataract cannot be easily removed using phacoemulsification. AFTER THE PROCEDURE  When an IOL is implanted, it does not need care. It becomes a permanent part of your eye and cannot be seen or felt.  Your doctor will schedule follow-up exams to check on your progress.  Review your other medicines with your doctor to see which can be resumed after surgery.  Use eye drops or take medicine as prescribed by your doctor.   This information is not intended to replace advice given to you by your health care provider. Make sure you discuss any questions you have with your health care provider.   Document Released: 01/01/2011 Document Revised: 02/02/2014 Document Reviewed: 01/01/2011 Elsevier Interactive Patient Education [employer]

## 2015-01-30 ENCOUNTER — Encounter (HOSPITAL_COMMUNITY)
Admission: RE | Admit: 2015-01-30 | Discharge: 2015-01-30 | Disposition: A | Discharge status: Home / Self Care | Payer: Medicare Other | Source: Ambulatory Visit | Attending: Ophthalmology | Admitting: Ophthalmology

## 2015-01-30 ENCOUNTER — Encounter (HOSPITAL_COMMUNITY): Payer: Self-pay

## 2015-01-30 DIAGNOSIS — Z01818 Encounter for other preprocedural examination: Secondary | ICD-10-CM | POA: Diagnosis not present

## 2015-01-30 DIAGNOSIS — H2512 Age-related nuclear cataract, left eye: Secondary | ICD-10-CM | POA: Diagnosis not present

## 2015-01-30 LAB — BASIC METABOLIC PANEL
Anion gap: 7 (ref 5–15)
BUN: 19 mg/dL (ref 6–20)
CHLORIDE: 104 mmol/L (ref 101–111)
CO2: 21 mmol/L — ABNORMAL LOW (ref 22–32)
CREATININE: 0.98 mg/dL (ref 0.44–1.00)
Calcium: 9.3 mg/dL (ref 8.9–10.3)
Glucose, Bld: 138 mg/dL — ABNORMAL HIGH (ref 65–99)
POTASSIUM: 4.5 mmol/L (ref 3.5–5.1)
SODIUM: 132 mmol/L — AB (ref 135–145)

## 2015-01-30 LAB — SURGICAL PCR SCREEN
MRSA, PCR: POSITIVE — AB
Staphylococcus aureus: POSITIVE — AB

## 2015-01-30 LAB — CBC
HCT: 46.7 % — ABNORMAL HIGH (ref 36.0–46.0)
HEMOGLOBIN: 15.9 g/dL — AB (ref 12.0–15.0)
MCH: 29.8 pg (ref 26.0–34.0)
MCHC: 34 g/dL (ref 30.0–36.0)
MCV: 87.6 fL (ref 78.0–100.0)
PLATELETS: 195 10*3/uL (ref 150–400)
RBC: 5.33 MIL/uL — AB (ref 3.87–5.11)
RDW: 15.3 % (ref 11.5–15.5)
WBC: 8.6 10*3/uL (ref 4.0–10.5)

## 2015-01-30 LAB — HCG, SERUM, QUALITATIVE: PREG SERUM: NEGATIVE

## 2015-02-01 MED ORDER — LIDOCAINE HCL (PF) 1 % IJ SOLN
INTRAMUSCULAR | Status: AC
  Filled 2015-02-01: qty 2

## 2015-02-01 MED ORDER — LIDOCAINE HCL 3.5 % OP GEL
OPHTHALMIC | Status: AC
  Filled 2015-02-01: qty 1

## 2015-02-01 MED ORDER — TETRACAINE HCL 0.5 % OP SOLN
OPHTHALMIC | Status: AC
  Filled 2015-02-01: qty 4

## 2015-02-01 MED ORDER — PHENYLEPHRINE HCL 2.5 % OP SOLN
OPHTHALMIC | Status: AC
  Filled 2015-02-01: qty 15

## 2015-02-01 MED ORDER — CYCLOPENTOLATE-PHENYLEPHRINE OP SOLN OPTIME - NO CHARGE
OPHTHALMIC | Status: AC
  Filled 2015-02-01: qty 2

## 2015-02-01 MED ORDER — NEOMYCIN-POLYMYXIN-DEXAMETH 3.5-10000-0.1 OP SUSP
OPHTHALMIC | Status: AC
  Filled 2015-02-01: qty 5

## 2015-02-04 ENCOUNTER — Ambulatory Visit (HOSPITAL_COMMUNITY): Payer: Medicare Other | Admitting: Anesthesiology

## 2015-02-04 ENCOUNTER — Encounter (HOSPITAL_COMMUNITY): Payer: Self-pay

## 2015-02-04 ENCOUNTER — Encounter (HOSPITAL_COMMUNITY)
Admission: RE | Disposition: A | Discharge status: Home / Self Care | Payer: Self-pay | Source: Ambulatory Visit | Attending: Ophthalmology

## 2015-02-04 ENCOUNTER — Ambulatory Visit (HOSPITAL_COMMUNITY)
Admission: RE | Admit: 2015-02-04 | Discharge: 2015-02-04 | Disposition: A | Discharge status: Home / Self Care | Payer: Medicare Other | Source: Ambulatory Visit | Attending: Ophthalmology | Admitting: Ophthalmology

## 2015-02-04 DIAGNOSIS — M549 Dorsalgia, unspecified: Secondary | ICD-10-CM | POA: Diagnosis not present

## 2015-02-04 DIAGNOSIS — I10 Essential (primary) hypertension: Secondary | ICD-10-CM | POA: Diagnosis not present

## 2015-02-04 DIAGNOSIS — Z833 Family history of diabetes mellitus: Secondary | ICD-10-CM | POA: Insufficient documentation

## 2015-02-04 DIAGNOSIS — H269 Unspecified cataract: Secondary | ICD-10-CM | POA: Diagnosis not present

## 2015-02-04 DIAGNOSIS — E119 Type 2 diabetes mellitus without complications: Secondary | ICD-10-CM | POA: Insufficient documentation

## 2015-02-04 DIAGNOSIS — F418 Other specified anxiety disorders: Secondary | ICD-10-CM | POA: Diagnosis not present

## 2015-02-04 DIAGNOSIS — G8929 Other chronic pain: Secondary | ICD-10-CM | POA: Insufficient documentation

## 2015-02-04 DIAGNOSIS — Z79899 Other long term (current) drug therapy: Secondary | ICD-10-CM | POA: Diagnosis not present

## 2015-02-04 DIAGNOSIS — H25042 Posterior subcapsular polar age-related cataract, left eye: Secondary | ICD-10-CM | POA: Diagnosis not present

## 2015-02-04 DIAGNOSIS — Z8249 Family history of ischemic heart disease and other diseases of the circulatory system: Secondary | ICD-10-CM | POA: Insufficient documentation

## 2015-02-04 DIAGNOSIS — K219 Gastro-esophageal reflux disease without esophagitis: Secondary | ICD-10-CM | POA: Diagnosis not present

## 2015-02-04 DIAGNOSIS — E282 Polycystic ovarian syndrome: Secondary | ICD-10-CM | POA: Insufficient documentation

## 2015-02-04 DIAGNOSIS — Z720 Tobacco use: Secondary | ICD-10-CM | POA: Diagnosis not present

## 2015-02-04 DIAGNOSIS — Z8614 Personal history of Methicillin resistant Staphylococcus aureus infection: Secondary | ICD-10-CM | POA: Insufficient documentation

## 2015-02-04 HISTORY — PX: CATARACT EXTRACTION W/PHACO: SHX586

## 2015-02-04 LAB — GLUCOSE, CAPILLARY: Glucose-Capillary: 148 mg/dL — ABNORMAL HIGH (ref 65–99)

## 2015-02-04 SURGERY — PHACOEMULSIFICATION, CATARACT, WITH IOL INSERTION
Anesthesia: Monitor Anesthesia Care | Site: Eye | Laterality: Left

## 2015-02-04 MED ORDER — NEOMYCIN-POLYMYXIN-DEXAMETH 3.5-10000-0.1 OP SUSP
OPHTHALMIC | Status: DC | PRN
  Administered 2015-02-04: 2 [drp] via OPHTHALMIC

## 2015-02-04 MED ORDER — BSS IO SOLN
INTRAOCULAR | Status: DC | PRN
Start: 2015-02-04 — End: 2015-02-04
  Administered 2015-02-04: 15 mL

## 2015-02-04 MED ORDER — MIDAZOLAM HCL 2 MG/2ML IJ SOLN
1.0000 mg | INTRAMUSCULAR | Status: DC | PRN
  Administered 2015-02-04: 2 mg via INTRAVENOUS

## 2015-02-04 MED ORDER — LACTATED RINGERS IV SOLN
INTRAVENOUS | Status: DC
  Administered 2015-02-04: 07:00:00 via INTRAVENOUS

## 2015-02-04 MED ORDER — PROVISC 10 MG/ML IO SOLN
INTRAOCULAR | Status: DC | PRN
Start: 2015-02-04 — End: 2015-02-04
  Administered 2015-02-04: 0.85 mL via INTRAOCULAR

## 2015-02-04 MED ORDER — EPINEPHRINE HCL 1 MG/ML IJ SOLN
INTRAMUSCULAR | Status: AC
  Filled 2015-02-04: qty 1

## 2015-02-04 MED ORDER — TETRACAINE HCL 0.5 % OP SOLN
1.0000 [drp] | OPHTHALMIC | Status: AC
Start: 2015-02-04 — End: 2015-02-04
  Administered 2015-02-04 (×3): 1 [drp] via OPHTHALMIC

## 2015-02-04 MED ORDER — FENTANYL CITRATE (PF) 100 MCG/2ML IJ SOLN
25.0000 ug | INTRAMUSCULAR | Status: AC
  Administered 2015-02-04: 25 ug via INTRAVENOUS

## 2015-02-04 MED ORDER — LIDOCAINE 3.5 % OP GEL OPTIME - NO CHARGE
OPHTHALMIC | Status: DC | PRN
  Administered 2015-02-04: 1 [drp] via OPHTHALMIC

## 2015-02-04 MED ORDER — LIDOCAINE HCL (PF) 1 % IJ SOLN
INTRAMUSCULAR | Status: DC | PRN
Start: 2015-02-04 — End: 2015-02-04
  Administered 2015-02-04: .7 mL

## 2015-02-04 MED ORDER — MIDAZOLAM HCL 2 MG/2ML IJ SOLN
INTRAMUSCULAR | Status: AC
  Filled 2015-02-04: qty 2

## 2015-02-04 MED ORDER — POVIDONE-IODINE 5 % OP SOLN
OPHTHALMIC | Status: DC | PRN
Start: 2015-02-04 — End: 2015-02-04
  Administered 2015-02-04: 1 via OPHTHALMIC

## 2015-02-04 MED ORDER — CYCLOPENTOLATE-PHENYLEPHRINE OP SOLN OPTIME - NO CHARGE
OPHTHALMIC | Status: AC
  Filled 2015-02-04: qty 20

## 2015-02-04 MED ORDER — CYCLOPENTOLATE-PHENYLEPHRINE 0.2-1 % OP SOLN
1.0000 [drp] | OPHTHALMIC | Status: AC
  Administered 2015-02-04 (×3): 1 [drp] via OPHTHALMIC

## 2015-02-04 MED ORDER — LIDOCAINE HCL 3.5 % OP GEL: 1.0000 "application " | Freq: Once | OPHTHALMIC | Status: DC

## 2015-02-04 MED ORDER — PHENYLEPHRINE HCL 2.5 % OP SOLN
1.0000 [drp] | OPHTHALMIC | Status: AC
  Administered 2015-02-04 (×3): 1 [drp] via OPHTHALMIC

## 2015-02-04 MED ORDER — EPINEPHRINE HCL 1 MG/ML IJ SOLN
INTRAOCULAR | Status: DC | PRN
  Administered 2015-02-04: 500 mL

## 2015-02-04 MED ORDER — FENTANYL CITRATE (PF) 100 MCG/2ML IJ SOLN
INTRAMUSCULAR | Status: AC
  Filled 2015-02-04: qty 2

## 2015-02-04 SURGICAL SUPPLY — 11 items
CLOTH BEACON ORANGE TIMEOUT ST (SAFETY) ×2 IMPLANT
EYE SHIELD UNIVERSAL CLEAR (GAUZE/BANDAGES/DRESSINGS) ×3 IMPLANT
GLOVE BIOGEL PI IND STRL 7.0 (GLOVE) IMPLANT
GLOVE BIOGEL PI INDICATOR 7.0 (GLOVE) ×2
GLOVE EXAM NITRILE MD LF STRL (GLOVE) ×2 IMPLANT
PAD ARMBOARD 7.5X6 YLW CONV (MISCELLANEOUS) ×2 IMPLANT
SIGHTPATH CAT PROC W REG LENS (Ophthalmic Related) ×3 IMPLANT
SYRINGE LUER LOK 1CC (MISCELLANEOUS) ×2 IMPLANT
TAPE SURG TRANSPORE 1 IN (GAUZE/BANDAGES/DRESSINGS) IMPLANT
TAPE SURGICAL TRANSPORE 1 IN (GAUZE/BANDAGES/DRESSINGS) ×2
WATER STERILE IRR 250ML POUR (IV SOLUTION) ×3 IMPLANT

## 2015-02-04 NOTE — Anesthesia Postprocedure Evaluation (Signed)
Anesthesia Post Note  Patient: Jamie Bell  Procedure(s) Performed: Procedure(s) (LRB): CATARACT EXTRACTION PHACO AND INTRAOCULAR LENS PLACEMENT ([place]) (Left)  Patient location during evaluation: Short Stay Anesthesia Type: MAC Level of consciousness: awake and alert and oriented Pain management: pain level controlled Vital Signs Assessment: post-procedure vital signs reviewed and stable Respiratory status: respiratory function stable Cardiovascular status: stable Postop Assessment: no signs of nausea or vomiting Anesthetic complications: no    Last Vitals:  Filed Vitals:   02/04/15 0725 02/04/15 0730  BP: 124/91 124/91  Pulse:    Temp:    Resp: 26 16    Last Pain: There were no vitals filed for this visit.               ADAMS, AMY A

## 2015-02-04 NOTE — Op Note (Signed)
Date of Admission: 02/04/2015  Date of Surgery: 02/04/2015   Pre-Op Dx: Cataract Left Eye  Post-Op Dx: Senile Posterior Subcapsular Cataract Left  Eye,  Dx Code T91.225  Surgeon: [name], M.D.  Assistants: None  Anesthesia: Topical with MAC  Indications: Painless, progressive loss of vision with compromise of daily activities.  Surgery: Cataract Extraction with Intraocular lens Implant Left Eye  Discription: The patient had dilating drops and viscous lidocaine placed into the Left eye in the pre-op holding area. After transfer to the operating room, a time out was performed. The patient was then prepped and draped. Beginning with a 49 degree blade a paracentesis port was made at the surgeon's 2 o'clock position. The anterior chamber was then filled with 1% non-preserved lidocaine. This was followed by filling the anterior chamber with Provisc.  A 2.17m keratome blade was used to make a clear corneal incision at the temporal limbus.  A bent cystatome needle was used to create a continuous tear capsulotomy. Hydrodissection was performed with balanced salt solution on a Fine canula. The lens nucleus was then removed using the phacoemulsification handpiece. Residual cortex was removed with the I&A handpiece. The anterior chamber and capsular bag were refilled with Provisc. A posterior chamber intraocular lens was placed into the capsular bag with it's injector. The implant was positioned with the Kuglan hook. The Provisc was then removed from the anterior chamber and capsular bag with the I&A handpiece. Stromal hydration of the main incision and paracentesis port was performed with BSS on a Fine canula. The wounds were tested for leak which was negative. The patient tolerated the procedure well. There were no operative complications. The patient was then transferred to the recovery room in stable condition.  Complications: None  Specimen: None  EBL: None  Prosthetic device: Hoya iSert 250, power  21.0 D, SN [id number]

## 2015-02-04 NOTE — Anesthesia Preprocedure Evaluation (Signed)
Anesthesia Evaluation  Patient identified by MRN, date of birth, ID band Patient awake    Reviewed: Allergy & Precautions, H&P , NPO status , Patient's Chart, lab work & pertinent test results  History of Anesthesia Complications Negative for: history of anesthetic complications  Airway Mallampati: II  TM Distance: >3 FB Neck ROM: Full    Dental  (+) Missing, Loose, Dental Advisory Given, Poor Dentition   Pulmonary COPD, Current Smoker,    breath sounds clear to auscultation       Cardiovascular hypertension, Pt. on medications  Rhythm:Regular Rate:Normal  '14 ECHO: EF 65-70%, valves Ok   Neuro/Psych Depression Chronic back pain: narcotics    GI/Hepatic Neg liver ROS, GERD  Medicated and Controlled,(+)     substance abuse  ,   Endo/Other  diabetes, Well Controlled, Oral Hypoglycemic AgentsMorbid obesity  Renal/GU negative Renal ROS     Musculoskeletal  (+) Arthritis ,   Abdominal (+) + obese,  Abdomen: soft. Bowel sounds: normal.  Peds  Hematology negative hematology ROS (+)   Anesthesia Other Findings ECHO 08/09/12 EF 65-70%  Reproductive/Obstetrics 10/23/13 preg test: NEG                             Anesthesia Physical Anesthesia Plan  ASA: III  Anesthesia Plan: MAC   Post-op Pain Management:    Induction: Intravenous  Airway Management Planned: Nasal Cannula  Additional Equipment:   Intra-op Plan:   Post-operative Plan:   Informed Consent: I have reviewed the patients History and Physical, chart, labs and discussed the procedure including the risks, benefits and alternatives for the proposed anesthesia with the patient or authorized representative who has indicated his/her understanding and acceptance.     Plan Discussed with:   Anesthesia Plan Comments:         Anesthesia Quick Evaluation

## 2015-02-04 NOTE — Anesthesia Procedure Notes (Signed)
Procedure Name: MAC Date/Time: 02/04/2015 7:36 AM Performed by: Andree Elk, Analiya Porco A Pre-anesthesia Checklist: Patient identified, Timeout performed, Emergency Drugs available, Suction available and Patient being monitored Oxygen Delivery Method: Nasal cannula

## 2015-02-04 NOTE — Discharge Instructions (Signed)
Anesthesia, Adult, Care After Refer to this sheet in the next few weeks. These instructions provide you with information on caring for yourself after your procedure. Your health care provider may also give you more specific instructions. Your treatment has been planned according to current medical practices, but problems sometimes occur. Call your health care provider if you have any problems or questions after your procedure. WHAT TO EXPECT AFTER THE PROCEDURE After the procedure, it is typical to experience:  Sleepiness.  Nausea and vomiting. HOME CARE INSTRUCTIONS  For the first 24 hours after general anesthesia:  Have a responsible person with you.  Do not drive a car. If you are alone, do not take public transportation.  Do not drink alcohol.  Do not take medicine that has not been prescribed by your health care provider.  Do not sign important papers or make important decisions.  You may resume a normal diet and activities as directed by your health care provider.  Change bandages (dressings) as directed.  If you have questions or problems that seem related to general anesthesia, call the hospital and ask for the anesthetist or anesthesiologist on call. SEEK MEDICAL CARE IF:  You have nausea and vomiting that continue the day after anesthesia.  You develop a rash. SEEK IMMEDIATE MEDICAL CARE IF:   You have difficulty breathing.  You have chest pain.  You have any allergic problems.   This information is not intended to replace advice given to you by your health care provider. Make sure you discuss any questions you have with your health care provider.   Document Released: 04/20/2000 Document Revised: 02/02/2014 Document Reviewed: 05/13/2011 Elsevier Interactive Patient Education [employer].

## 2015-02-04 NOTE — H&P (Signed)
I have reviewed the H&P, the patient was re-examined, and I have identified no interval changes in medical condition and plan of care since the history and physical of record  

## 2015-02-04 NOTE — Transfer of Care (Signed)
Immediate Anesthesia Transfer of Care Note  Patient: Jamie Bell  Procedure(s) Performed: Procedure(s) with comments: CATARACT EXTRACTION PHACO AND INTRAOCULAR LENS PLACEMENT (IOC) (Left) - CDE: 4.11  Patient Location: Short Stay  Anesthesia Type:MAC  Level of Consciousness: awake, alert , oriented and patient cooperative  Airway & Oxygen Therapy: Patient Spontanous Breathing  Post-op Assessment: Report given to RN and Post -op Vital signs reviewed and stable  Post vital signs: Reviewed and stable  Last Vitals:  Filed Vitals:   02/04/15 0725 02/04/15 0730  BP: 124/91 124/91  Pulse:    Temp:    Resp: 26 16    Complications: No apparent anesthesia complications

## 2015-02-05 ENCOUNTER — Encounter (HOSPITAL_COMMUNITY): Payer: Self-pay | Admitting: Ophthalmology

## 2015-02-25 DIAGNOSIS — M545 Low back pain: Secondary | ICD-10-CM | POA: Diagnosis not present

## 2015-02-25 DIAGNOSIS — E119 Type 2 diabetes mellitus without complications: Secondary | ICD-10-CM | POA: Diagnosis not present

## 2015-02-25 DIAGNOSIS — I10 Essential (primary) hypertension: Secondary | ICD-10-CM | POA: Diagnosis not present

## 2015-02-25 DIAGNOSIS — J449 Chronic obstructive pulmonary disease, unspecified: Secondary | ICD-10-CM | POA: Diagnosis not present

## 2015-03-28 DIAGNOSIS — J449 Chronic obstructive pulmonary disease, unspecified: Secondary | ICD-10-CM | POA: Diagnosis not present

## 2015-03-28 DIAGNOSIS — J45998 Other asthma: Secondary | ICD-10-CM | POA: Diagnosis not present

## 2015-04-28 DIAGNOSIS — J45998 Other asthma: Secondary | ICD-10-CM | POA: Diagnosis not present

## 2015-04-28 DIAGNOSIS — J449 Chronic obstructive pulmonary disease, unspecified: Secondary | ICD-10-CM | POA: Diagnosis not present

## 2015-05-28 DIAGNOSIS — J45998 Other asthma: Secondary | ICD-10-CM | POA: Diagnosis not present

## 2015-05-28 DIAGNOSIS — J449 Chronic obstructive pulmonary disease, unspecified: Secondary | ICD-10-CM | POA: Diagnosis not present

## 2015-06-13 DIAGNOSIS — M5137 Other intervertebral disc degeneration, lumbosacral region: Secondary | ICD-10-CM | POA: Diagnosis not present

## 2015-06-13 DIAGNOSIS — J449 Chronic obstructive pulmonary disease, unspecified: Secondary | ICD-10-CM | POA: Diagnosis not present

## 2015-06-13 DIAGNOSIS — I10 Essential (primary) hypertension: Secondary | ICD-10-CM | POA: Diagnosis not present

## 2015-06-13 DIAGNOSIS — E119 Type 2 diabetes mellitus without complications: Secondary | ICD-10-CM | POA: Diagnosis not present

## 2015-06-28 DIAGNOSIS — J45998 Other asthma: Secondary | ICD-10-CM | POA: Diagnosis not present

## 2015-06-28 DIAGNOSIS — J449 Chronic obstructive pulmonary disease, unspecified: Secondary | ICD-10-CM | POA: Diagnosis not present

## 2015-07-28 DIAGNOSIS — J45998 Other asthma: Secondary | ICD-10-CM | POA: Diagnosis not present

## 2015-07-28 DIAGNOSIS — J449 Chronic obstructive pulmonary disease, unspecified: Secondary | ICD-10-CM | POA: Diagnosis not present

## 2015-08-22 DIAGNOSIS — N39498 Other specified urinary incontinence: Secondary | ICD-10-CM | POA: Diagnosis not present

## 2015-08-28 DIAGNOSIS — J45998 Other asthma: Secondary | ICD-10-CM | POA: Diagnosis not present

## 2015-08-28 DIAGNOSIS — J449 Chronic obstructive pulmonary disease, unspecified: Secondary | ICD-10-CM | POA: Diagnosis not present

## 2015-09-12 DIAGNOSIS — K210 Gastro-esophageal reflux disease with esophagitis: Secondary | ICD-10-CM | POA: Diagnosis not present

## 2015-09-12 DIAGNOSIS — J449 Chronic obstructive pulmonary disease, unspecified: Secondary | ICD-10-CM | POA: Diagnosis not present

## 2015-09-12 DIAGNOSIS — I10 Essential (primary) hypertension: Secondary | ICD-10-CM | POA: Diagnosis not present

## 2015-09-12 DIAGNOSIS — R079 Chest pain, unspecified: Secondary | ICD-10-CM | POA: Diagnosis not present

## 2015-09-28 DIAGNOSIS — J45998 Other asthma: Secondary | ICD-10-CM | POA: Diagnosis not present

## 2015-09-28 DIAGNOSIS — J449 Chronic obstructive pulmonary disease, unspecified: Secondary | ICD-10-CM | POA: Diagnosis not present

## 2015-09-29 ENCOUNTER — Encounter (HOSPITAL_COMMUNITY): Payer: Self-pay | Admitting: Emergency Medicine

## 2015-09-29 ENCOUNTER — Observation Stay (HOSPITAL_COMMUNITY)
Admission: EM | Admit: 2015-09-29 | Discharge: 2015-10-03 | Disposition: A | Discharge status: Home / Self Care | Payer: Medicare Other | Attending: Pulmonary Disease | Admitting: Pulmonary Disease

## 2015-09-29 ENCOUNTER — Emergency Department (HOSPITAL_COMMUNITY): Payer: Medicare Other

## 2015-09-29 DIAGNOSIS — F1120 Opioid dependence, uncomplicated: Secondary | ICD-10-CM

## 2015-09-29 DIAGNOSIS — K7460 Unspecified cirrhosis of liver: Secondary | ICD-10-CM | POA: Diagnosis present

## 2015-09-29 DIAGNOSIS — R1084 Generalized abdominal pain: Secondary | ICD-10-CM

## 2015-09-29 DIAGNOSIS — Z79891 Long term (current) use of opiate analgesic: Secondary | ICD-10-CM | POA: Diagnosis not present

## 2015-09-29 DIAGNOSIS — M549 Dorsalgia, unspecified: Secondary | ICD-10-CM | POA: Diagnosis present

## 2015-09-29 DIAGNOSIS — K5900 Constipation, unspecified: Secondary | ICD-10-CM

## 2015-09-29 DIAGNOSIS — Z79899 Other long term (current) drug therapy: Secondary | ICD-10-CM | POA: Insufficient documentation

## 2015-09-29 DIAGNOSIS — R197 Diarrhea, unspecified: Secondary | ICD-10-CM

## 2015-09-29 DIAGNOSIS — I10 Essential (primary) hypertension: Secondary | ICD-10-CM | POA: Diagnosis not present

## 2015-09-29 DIAGNOSIS — F17200 Nicotine dependence, unspecified, uncomplicated: Secondary | ICD-10-CM

## 2015-09-29 DIAGNOSIS — R1030 Lower abdominal pain, unspecified: Secondary | ICD-10-CM | POA: Diagnosis not present

## 2015-09-29 DIAGNOSIS — F17210 Nicotine dependence, cigarettes, uncomplicated: Secondary | ICD-10-CM | POA: Diagnosis not present

## 2015-09-29 DIAGNOSIS — R1013 Epigastric pain: Secondary | ICD-10-CM

## 2015-09-29 DIAGNOSIS — K567 Ileus, unspecified: Principal | ICD-10-CM

## 2015-09-29 DIAGNOSIS — E119 Type 2 diabetes mellitus without complications: Secondary | ICD-10-CM | POA: Diagnosis not present

## 2015-09-29 HISTORY — DX: Other psychoactive substance abuse, uncomplicated: F19.10

## 2015-09-29 LAB — URINALYSIS, ROUTINE W REFLEX MICROSCOPIC
Bilirubin Urine: NEGATIVE
Glucose, UA: >1000 mg/dL — AB
LEUKOCYTES UA: NEGATIVE
NITRITE: NEGATIVE
SPECIFIC GRAVITY, URINE: 1.015 (ref 1.005–1.030)
pH: 5.5 (ref 5.0–8.0)

## 2015-09-29 LAB — COMPREHENSIVE METABOLIC PANEL
ALK PHOS: 104 U/L (ref 38–126)
ALT: 33 U/L (ref 14–54)
ANION GAP: 12 (ref 5–15)
AST: 44 U/L — ABNORMAL HIGH (ref 15–41)
Albumin: 3.7 g/dL (ref 3.5–5.0)
BILIRUBIN TOTAL: 0.8 mg/dL (ref 0.3–1.2)
BUN: 13 mg/dL (ref 6–20)
CALCIUM: 9 mg/dL (ref 8.9–10.3)
CO2: 21 mmol/L — ABNORMAL LOW (ref 22–32)
Chloride: 102 mmol/L (ref 101–111)
Creatinine, Ser: 1.04 mg/dL — ABNORMAL HIGH (ref 0.44–1.00)
GFR calc non Af Amer: >60 mL/min (ref >60)
Glucose, Bld: 282 mg/dL — ABNORMAL HIGH (ref 65–99)
POTASSIUM: 3.6 mmol/L (ref 3.5–5.1)
SODIUM: 135 mmol/L (ref 135–145)
TOTAL PROTEIN: 7.4 g/dL (ref 6.5–8.1)

## 2015-09-29 LAB — CBC
HEMATOCRIT: 41.8 % (ref 36.0–46.0)
HEMOGLOBIN: 14.3 g/dL (ref 12.0–15.0)
MCH: 31.9 pg (ref 26.0–34.0)
MCHC: 34.2 g/dL (ref 30.0–36.0)
MCV: 93.3 fL (ref 78.0–100.0)
Platelets: 85 10*3/uL — ABNORMAL LOW (ref 150–400)
RBC: 4.48 MIL/uL (ref 3.87–5.11)
RDW: 14.4 % (ref 11.5–15.5)
WBC: 9.8 10*3/uL (ref 4.0–10.5)

## 2015-09-29 LAB — LACTIC ACID, PLASMA: LACTIC ACID, VENOUS: 1.3 mmol/L (ref 0.5–1.9)

## 2015-09-29 LAB — URINE MICROSCOPIC-ADD ON

## 2015-09-29 LAB — LIPASE, BLOOD: Lipase: 23 U/L (ref 11–51)

## 2015-09-29 LAB — PREGNANCY, URINE: PREG TEST UR: NEGATIVE

## 2015-09-29 LAB — MRSA PCR SCREENING: MRSA by PCR: NEGATIVE

## 2015-09-29 MED ORDER — OXYCODONE HCL 5 MG PO TABS
15.0000 mg | ORAL_TABLET | Freq: Three times a day (TID) | ORAL | Status: DC | PRN
  Administered 2015-09-29 – 2015-10-03 (×10): 15 mg via ORAL
  Filled 2015-09-29 (×10): qty 3

## 2015-09-29 MED ORDER — ACETAMINOPHEN 650 MG RE SUPP: 650.0000 mg | Freq: Four times a day (QID) | RECTAL | Status: DC | PRN

## 2015-09-29 MED ORDER — ENOXAPARIN SODIUM 40 MG/0.4ML ~~LOC~~ SOLN
40.0000 mg | SUBCUTANEOUS | Status: DC
  Administered 2015-09-29 – 2015-10-02 (×4): 40 mg via SUBCUTANEOUS
  Filled 2015-09-29 (×4): qty 0.4

## 2015-09-29 MED ORDER — FENTANYL CITRATE (PF) 100 MCG/2ML IJ SOLN
50.0000 ug | INTRAMUSCULAR | Status: AC | PRN
  Administered 2015-09-29 (×2): 50 ug via INTRAVENOUS
  Filled 2015-09-29 (×2): qty 2

## 2015-09-29 MED ORDER — NICOTINE 21 MG/24HR TD PT24
21.0000 mg | MEDICATED_PATCH | Freq: Once | TRANSDERMAL | Status: AC
  Administered 2015-09-29: 21 mg via TRANSDERMAL
  Filled 2015-09-29: qty 1

## 2015-09-29 MED ORDER — CANAGLIFLOZIN 100 MG PO TABS: 150.0000 mg | ORAL_TABLET | Freq: Two times a day (BID) | ORAL | Status: DC

## 2015-09-29 MED ORDER — FAMOTIDINE IN NACL 20-0.9 MG/50ML-% IV SOLN
20.0000 mg | Freq: Two times a day (BID) | INTRAVENOUS | Status: DC
  Administered 2015-09-29 – 2015-09-30 (×3): 20 mg via INTRAVENOUS
  Filled 2015-09-29 (×4): qty 50

## 2015-09-29 MED ORDER — ONDANSETRON HCL 4 MG/2ML IJ SOLN: 4.0000 mg | Freq: Four times a day (QID) | INTRAMUSCULAR | Status: DC | PRN

## 2015-09-29 MED ORDER — INSULIN ASPART 100 UNIT/ML ~~LOC~~ SOLN
0.0000 [IU] | Freq: Three times a day (TID) | SUBCUTANEOUS | Status: DC
  Administered 2015-10-01 – 2015-10-02 (×4): 1 [IU] via SUBCUTANEOUS

## 2015-09-29 MED ORDER — PRAVASTATIN SODIUM 10 MG PO TABS
20.0000 mg | ORAL_TABLET | Freq: Every day | ORAL | Status: DC
  Administered 2015-09-30 – 2015-10-02 (×3): 20 mg via ORAL
  Filled 2015-09-29 (×3): qty 2

## 2015-09-29 MED ORDER — SODIUM CHLORIDE 0.9 % IV SOLN: INTRAVENOUS | Status: AC

## 2015-09-29 MED ORDER — MORPHINE SULFATE (PF) 2 MG/ML IV SOLN
2.0000 mg | INTRAVENOUS | Status: DC | PRN
Start: 2015-09-29 — End: 2015-10-03
  Administered 2015-09-30 – 2015-10-03 (×9): 2 mg via INTRAVENOUS
  Filled 2015-09-29 (×10): qty 1

## 2015-09-29 MED ORDER — METFORMIN HCL 500 MG PO TABS: 1000.0000 mg | ORAL_TABLET | Freq: Two times a day (BID) | ORAL | Status: DC

## 2015-09-29 MED ORDER — DIATRIZOATE MEGLUMINE & SODIUM 66-10 % PO SOLN
ORAL | Status: AC
  Administered 2015-09-29: 30 mL
  Filled 2015-09-29: qty 30

## 2015-09-29 MED ORDER — TIOTROPIUM BROMIDE MONOHYDRATE 18 MCG IN CAPS
ORAL_CAPSULE | RESPIRATORY_TRACT | Status: AC
  Filled 2015-09-29: qty 5

## 2015-09-29 MED ORDER — SODIUM CHLORIDE 0.9 % IV SOLN
INTRAVENOUS | Status: DC
  Administered 2015-09-29: 15:00:00 via INTRAVENOUS

## 2015-09-29 MED ORDER — SODIUM CHLORIDE 0.9 % IV SOLN
INTRAVENOUS | Status: DC
  Administered 2015-09-29 – 2015-10-03 (×6): via INTRAVENOUS

## 2015-09-29 MED ORDER — METFORMIN HCL 500 MG PO TABS
1000.0000 mg | ORAL_TABLET | Freq: Two times a day (BID) | ORAL | Status: DC
  Administered 2015-09-30: 1000 mg via ORAL

## 2015-09-29 MED ORDER — IOPAMIDOL (ISOVUE-300) INJECTION 61%
100.0000 mL | Freq: Once | INTRAVENOUS | Status: AC | PRN
  Administered 2015-09-29: 100 mL via INTRAVENOUS

## 2015-09-29 MED ORDER — CANAGLIFLOZIN-METFORMIN HCL 150-1000 MG PO TABS: 1.0000 | ORAL_TABLET | Freq: Two times a day (BID) | ORAL | Status: DC

## 2015-09-29 MED ORDER — ONDANSETRON HCL 4 MG/2ML IJ SOLN
4.0000 mg | INTRAMUSCULAR | Status: DC | PRN
  Administered 2015-09-29: 4 mg via INTRAVENOUS
  Filled 2015-09-29: qty 2

## 2015-09-29 MED ORDER — TIOTROPIUM BROMIDE MONOHYDRATE 18 MCG IN CAPS
18.0000 ug | ORAL_CAPSULE | Freq: Every day | RESPIRATORY_TRACT | Status: DC | PRN
Start: 2015-09-29 — End: 2015-10-01
  Administered 2015-10-01 (×2): 18 ug via RESPIRATORY_TRACT
  Filled 2015-09-29: qty 5

## 2015-09-29 MED ORDER — NICOTINE 21 MG/24HR TD PT24
MEDICATED_PATCH | TRANSDERMAL | Status: AC
  Administered 2015-09-29: 21 mg via TRANSDERMAL
  Filled 2015-09-29: qty 1

## 2015-09-29 MED ORDER — ONDANSETRON HCL 4 MG PO TABS: 4.0000 mg | ORAL_TABLET | Freq: Four times a day (QID) | ORAL | Status: DC | PRN

## 2015-09-29 MED ORDER — ACETAMINOPHEN 325 MG PO TABS
650.0000 mg | ORAL_TABLET | Freq: Four times a day (QID) | ORAL | Status: DC | PRN
  Administered 2015-09-29: 650 mg via ORAL
  Filled 2015-09-29: qty 2

## 2015-09-29 MED ORDER — CANAGLIFLOZIN 300 MG PO TABS
300.0000 mg | ORAL_TABLET | Freq: Every day | ORAL | Status: DC
  Filled 2015-09-29 (×3): qty 1

## 2015-09-29 NOTE — H&P (Signed)
[facility] History and Physical  [name] LTJ:[id number] DOB: 1970/08/14 DOA: 09/29/2015  Referring physician: Dr Thurnell Garbe PCP: Alonza Bogus, MD   Chief Complaint: Abd pain  HPI: Jamie Bell is a 45 y.o. female with 2 day history of cramping abd pain and diarrhea, chills and nausea, no vomiting.  Has been taking BeeCee's for headaches daily, 1-2 per day usually.  No hematemesis or bloody stool, no fevers.  CT abd showed no acute disease, possible cirrhosis (nodular liver, enlarged spleen), no ascites.  Asked to see for admission.    Divorced, lives with her parents. No children.  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  Can clean up at home, cannot lift anything. Like to watch TV at home, detective shows.    Takes oxycodone 15 mg tid daily.  +tobacco, no etoh.  Denies ever being heavy longterm etoh drinker.  +hx IVDA years ago.    Old chart: Jul '07 - acute chole w stones, underwent lap chole 03/27/05. DDD lumbar spine, DM, chron depression May '13 - psych / substance abuse - requested admit for detox from opiates.  Using opiates since 2001. Occ Xanax, Valium, occ THC, rare cocaine.  Rx'd w clonidine detox protocol.  Citalopram added.   Jul '14 - hx DM/ opioid dependence w chest pain.  CTA chest neg, CT chest showed mass lesion in ant mediastinum, poss septic embolux.  .  BC'x s+ for MRSA.  Rx's w 6 wks abx in case of endocarditis.  Home care w IV vanc at dc.  TEE no vegetations.  Oct '15 - spinal stenosis cervical region , underwent post cervical decompressive laminectomy, got OOB and ambulating.  [facility] home.  May '16 - SOB, panic attack, bilat infiltrates on CXR.  Rx with lasix/ nebs no improvement.  Tx'd [facility] , treated for CAP.  PFT"s 2014 w/o obstruction.  Tract marks on bilat UE's on exam.    R hand cellulitis.      ROS  denies CP  no joint pain   no HA   no blurry vision  no rash  no diarrhea  no nausea/ vomiting  no dysuria  no difficulty voiding  no change in urine color    Past Medical History  Past Medical History:  Diagnosis Date  . Chronic back pain   . DDD (degenerative disc disease)   . Decreased hearing on the right.   F/u by ENT in the past and no further management.  . Depression   . Diabetes mellitus   . GERD (gastroesophageal reflux disease)   . Hypertension    took med. for HTN, several yrs. ago, no longer needs , seen by Dr. Lattie Haw last yr. during a hosp. at [facility], for MRSA bacteremia   . MRSA bacteremia 2014   Tx with Vancomycin  . PCOS (polycystic ovarian syndrome)   . Sciatica    HNP- Cerv. 7- T2   Past Surgical History  Past Surgical History:  Procedure Laterality Date  . BACK SURGERY    . BREAST SURGERY Left    blocked milk ducts   . CARPAL TUNNEL RELEASE    . CATARACT EXTRACTION W/PHACO Left 02/04/2015   Procedure: CATARACT EXTRACTION PHACO AND INTRAOCULAR LENS PLACEMENT (IOC);  Surgeon: [name], MD;  Location: [facility];  Service: Ophthalmology;  Laterality: Left;  CDE: 4.11  . CHOLECYSTECTOMY    .  NECK SURGERY    . PERIPHERALLY INSERTED CENTRAL CATHETER INSERTION Right 2007   for treatment with Vancomycin   . POSTERIOR CERVICAL LAMINECTOMY N/A 11/22/2013   Procedure: POSTERIOR CERVICAL LAMINECTOMY C7/T1, T/1-2;  Surgeon: [name], MD;  Location: [place] [facility];  Service: Neurosurgery;  Laterality: N/A;  POSTERIOR CERVICAL LAMINECTOMY C7/T1, T/1-2  . TEE WITHOUT CARDIOVERSION N/A 08/09/2012   Procedure: TRANSESOPHAGEAL ECHOCARDIOGRAM (TEE);  Surgeon: [name], MD;  Location: [facility];  Service: Cardiovascular;  Laterality: N/A;   Family History  Family History  Problem Relation Age of Onset  . Hypertension Mother   . Hypertension Father   . Diabetes Brother   . Diabetes Other    Social History  reports that she has been smoking Cigarettes.  She has a 30.00 pack-year smoking  history. She has never used smokeless tobacco. She reports that she uses drugs, including IV and Marijuana. She reports that she does not drink alcohol. Allergies  Allergies  Allergen Reactions  . Ciprofloxacin Swelling  . Nsaids Hives   Home medications Prior to Admission medications   Medication Sig Start Date End Date Taking? Authorizing Provider  INVOKAMET [id number] MG TABS Take 1 tablet by mouth 2 (two) times daily. 09/03/15  Yes Historical Provider, MD  Ketamine HCl POWD Apply 1 application topically daily as needed (pain in back).  12/31/14  Yes Historical Provider, MD  omeprazole (PRILOSEC) 20 MG capsule Take 20 mg by mouth daily as needed (heartburn).   Yes Historical Provider, MD  oxyCODONE (ROXICODONE) 15 MG immediate release tablet Take 1 tablet by mouth 3 (three) times daily.  09/03/15  Yes Historical Provider, MD  pantoprazole (PROTONIX) 40 MG tablet Take 40 mg by mouth daily.  12/31/14  Yes Historical Provider, MD  pravastatin (PRAVACHOL) 20 MG tablet Take 20 mg by mouth daily.  12/31/14  Yes Historical Provider, MD  SPIRIVA HANDIHALER 18 MCG inhalation capsule Place 18 mcg into inhaler and inhale daily as needed (sob).  12/31/14  Yes Historical Provider, MD   Liver Function Tests  Recent Labs Lab 09/29/15 1437  AST 44*  ALT 33  ALKPHOS 104  BILITOT 0.8  PROT 7.4  ALBUMIN 3.7    Recent Labs Lab 09/29/15 1437  LIPASE 23   CBC  Recent Labs Lab 09/29/15 1437  WBC 9.8  HGB 14.3  HCT 41.8  MCV 93.3  PLT 85*   Basic Metabolic Panel  Recent Labs Lab 09/29/15 1437  NA 135  K 3.6  CL 102  CO2 21*  GLUCOSE 282*  BUN 13  CREATININE 1.04*  CALCIUM 9.0     Vitals:   09/29/15 1425 09/29/15 1426 09/29/15 1645  BP: 119/74  104/60  Pulse: 97  94  Resp: 16  16  Temp: 97.8 F (36.6 C)    TempSrc: Oral    SpO2: 100%  98%  Weight:  83 kg (183 lb)   Height:  5' 5.5" (1.664 m)    Exam: BP 119/74  R 16  HR 94  Temp 97.8  SaO2 100 RA  Gen disheveled, no  distress, walks slowly w a grimace No rash, cyanosis or gangrene Sclera anicteric, throat clear  No jvd or bruits Chest clear bilat RRR no MRG Abd is diffusely tender, dec'd BS, no rebound, no ascites or masses, no HSM GU defer MS no joint effusions or deformity Ext no LE edema / no  wounds or ulcers Neuro is alert, Ox 3 , nf, no asterixis  Na 135 K 3.6 Cr 1.04 eGFR >60  Alb 3.7  Anion gap = 12, CO2 21 WBC 9k  Hb 14  plt 85  Glu 282  AST 44/ ALT 33 / Tbili 0.8 Urine preg neg UA > 0-5 rbc/ wbc, few bact, hazy, >1000 glu, trace prot  Home meds:  Invokamet [id number] take one bid Ketamine prn daily Prilosec, Oxycodone 15 tid prn, Protonix, Pravachol, Spiriva 18 mcg   CT Abd: 1) Findings consistent with hepatic cirrhosis. No evidence of hepatic neoplasm. 2) Moderate splenomegaly, consistent with portal venous hypertension. No evidence of ascites. 3) Mild diffuse small bowel wall thickening and dilatation, without transition point. This is suspicious for mild ileus and hypoalbuminemia in the setting of cirrhosis    Assessment: 1.  Abd pain / diarrhea - unclear cause, uses BeeCee's daily , poss PUD vs gastroenteritis.  Nothing acute on CT abd.  Metformin-induced lactic acidosis can cause abd pain, check lactic acid level.  Anion gap is not elevated, however.  Admit, IV PPI, pain medication prn, IVF's, lactic acid level.   2.  Possible cirrhosis - by abd CT today, would be a new diagnosis.  Has low plts first time.  Check Heb B/ C, +hx IVDA.  No heavy etoh hx to explain this.  No ascites, LFT's normal. Consider GI eval. 3.  Chron back pain - on daily narcotics 4.  Disability 5.  DM2 - on canaglifozin/ metformin combination, no insulin.   Plan - as above     Sol Blazing [facility] Pager [phone]  Cell [phone]  If 7PM-7AM, please contact night-coverage [email or web address] Password [email or web address] 09/29/2015, 5:13 PM

## 2015-09-29 NOTE — ED Provider Notes (Signed)
[facility] DEPT Provider Note   CSN: [id number] Arrival date & time: 09/29/15  1421     History   Chief Complaint Chief Complaint  Patient presents with  . Abdominal Pain    HPI Jamie Bell is a 45 y.o. female.  HPI  Pt was seen at 1435.  Per pt, c/o gradual onset and persistence of constant generalized abd "pain" for the past 2 to 3 days.  Has been associated with multiple intermittent episodes of diarrhea.  Denies N/V, no fevers, no back pain, no rash, no CP/SOB, no black or blood in stools.      Past Medical History:  Diagnosis Date  . Chronic back pain   . DDD (degenerative disc disease)   . Decreased hearing on the right.   F/u by ENT in the past and no further management.  . Depression   . Diabetes mellitus   . GERD (gastroesophageal reflux disease)   . Hypertension    took med. for HTN, several yrs. ago, no longer needs , seen by Dr. Lattie Haw last yr. during a hosp. at [facility], for MRSA bacteremia   . MRSA bacteremia 2014   Tx with Vancomycin  . PCOS (polycystic ovarian syndrome)   . Polysubstance abuse   . Sciatica    HNP- Cerv. 7- T2    Patient Active Problem List   Diagnosis Date Noted  . Demand ischemia of myocardium ([place])   . Severe sepsis ([place])   . Sepsis due to pneumonia ([place])   . Type 2 diabetes mellitus with diabetic chronic kidney disease ([place])   . Anxiety state   . Depression   . Chronic back pain   . Cellulitis of right hand   . Tobacco abuse   . Hypoxia 05/31/2014  . Respiratory failure, acute ([place]) 05/31/2014  . Acute renal failure syndrome ([place])   . Spinal stenosis in cervical region 11/22/2013  . Chest wall pain 08/04/2012  . H/O opioid abuse 08/04/2012  . Opiate dependence ([place]) 06/04/2011  . TOBACCO USER 11/03/2008  . OPIOID WITHDRAWAL 10/25/2008  . BACK PAIN, CHRONIC 10/25/2008  . HYPERTENSION, BENIGN 06/10/2006  . DM, UNCOMPLICATED, TYPE II 62/26/3335  . POLYCYSTIC OVARIAN DISEASE 04/06/2006  . BACK PAIN, LUMBAR,  CHRONIC 04/06/2006  . ABNORMAL FINDINGS, ELEVATED BP W/O HTN 04/06/2006    Past Surgical History:  Procedure Laterality Date  . BACK SURGERY    . BREAST SURGERY Left    blocked milk ducts   . CARPAL TUNNEL RELEASE    . CATARACT EXTRACTION W/PHACO Left 02/04/2015   Procedure: CATARACT EXTRACTION PHACO AND INTRAOCULAR LENS PLACEMENT (IOC);  Surgeon: [name], MD;  Location: [facility] ORS;  Service: Ophthalmology;  Laterality: Left;  CDE: 4.11  . CHOLECYSTECTOMY    . NECK SURGERY    . PERIPHERALLY INSERTED CENTRAL CATHETER INSERTION Right 2007   for treatment with Vancomycin   . POSTERIOR CERVICAL LAMINECTOMY N/A 11/22/2013   Procedure: POSTERIOR CERVICAL LAMINECTOMY C7/T1, T/1-2;  Surgeon: [name], MD;  Location: [facility] ORS;  Service: Neurosurgery;  Laterality: N/A;  POSTERIOR CERVICAL LAMINECTOMY C7/T1, T/1-2  . TEE WITHOUT CARDIOVERSION N/A 08/09/2012   Procedure: TRANSESOPHAGEAL ECHOCARDIOGRAM (TEE);  Surgeon: [name], MD;  Location: [facility] ENDO SUITE;  Service: Cardiovascular;  Laterality: N/A;    OB History    Gravida Para Term Preterm AB Living             0   SAB TAB Ectopic Multiple Live Births  Home Medications    Prior to Admission medications   Medication Sig Start Date End Date Taking? Authorizing Provider  INVOKAMET 980 100 0943 MG TABS Take 1 tablet by mouth 2 (two) times daily. 09/03/15  Yes Historical Provider, MD  Ketamine HCl POWD Apply 1 application topically daily as needed (pain in back).  12/31/14  Yes Historical Provider, MD  omeprazole (PRILOSEC) 20 MG capsule Take 20 mg by mouth daily as needed (heartburn).   Yes Historical Provider, MD  oxyCODONE (ROXICODONE) 15 MG immediate release tablet Take 1 tablet by mouth 3 (three) times daily.  09/03/15  Yes Historical Provider, MD  pantoprazole (PROTONIX) 40 MG tablet Take 40 mg by mouth daily.  12/31/14  Yes Historical Provider, MD  pravastatin (PRAVACHOL) 20 MG tablet Take 20 mg by mouth daily.   12/31/14  Yes Historical Provider, MD  SPIRIVA HANDIHALER 18 MCG inhalation capsule Place 18 mcg into inhaler and inhale daily as needed (sob).  12/31/14  Yes Historical Provider, MD    Family History Family History  Problem Relation Age of Onset  . Hypertension Mother   . Hypertension Father   . Diabetes Brother   . Diabetes Other     Social History Social History  Substance Use Topics  . Smoking status: Current Every Day Smoker    Packs/day: 1.50    Years: 20.00    Types: Cigarettes  . Smokeless tobacco: Never Used     Comment: pain pills  . Alcohol use No     Allergies   Ciprofloxacin and Nsaids   Review of Systems Review of Systems ROS: Statement: All systems negative except as marked or noted in the HPI; Constitutional: Negative for fever and chills. ; ; Eyes: Negative for eye pain, redness and discharge. ; ; ENMT: Negative for ear pain, hoarseness, nasal congestion, sinus pressure and sore throat. ; ; Cardiovascular: Negative for chest pain, palpitations, diaphoresis, dyspnea and peripheral edema. ; ; Respiratory: Negative for cough, wheezing and stridor. ; ; Gastrointestinal: +abd pain, diarrhea. Negative for nausea, vomiting, blood in stool, hematemesis, jaundice and rectal bleeding. . ; ; Genitourinary: Negative for dysuria, flank pain and hematuria. ; ; GYN:  No pelvic pain, no vaginal bleeding, no vaginal discharge, no vulvar pain. ;; Musculoskeletal: Negative for back pain and neck pain. Negative for swelling and trauma.; ; Skin: Negative for pruritus, rash, abrasions, blisters, bruising and skin lesion.; ; Neuro: Negative for headache, lightheadedness and neck stiffness. Negative for weakness, altered level of consciousness, altered mental status, extremity weakness, paresthesias, involuntary movement, seizure and syncope.       Physical Exam Updated Vital Signs BP 119/74 (BP Location: Left Arm)   Pulse 97   Temp 97.8 F (36.6 C) (Oral)   Resp 16   Ht 5' 5.5"  (1.664 m)   Wt 183 lb (83 kg)   LMP 09/27/2015   SpO2 100%   BMI 29.99 kg/m   Physical Exam 1440: Physical examination:  Nursing notes reviewed; Vital signs and O2 SAT reviewed;  Constitutional: Well developed, Well nourished, Well hydrated, Uncomfortable appearing.; Head:  Normocephalic, atraumatic; Eyes: EOMI, PERRL, No scleral icterus; ENMT: Mouth and pharynx normal, Mucous membranes moist; Neck: Supple, Full range of motion, No lymphadenopathy; Cardiovascular: Regular rate and rhythm, No gallop; Respiratory: Breath sounds clear & equal bilaterally, No wheezes.  Speaking full sentences with ease, Normal respiratory effort/excursion; Chest: Nontender, Movement normal; Abdomen: Soft, +diffuse tenderness to palp.  Nondistended, Normal bowel sounds; Genitourinary: No CVA tenderness; Extremities: Pulses normal, No tenderness,  No edema, No calf edema or asymmetry.; Neuro: AA&Ox3, Major CN grossly intact.  Speech clear. No gross focal motor or sensory deficits in extremities.; Skin: Color normal, Warm, Dry.   ED Treatments / Results  Labs (all labs ordered are listed, but only abnormal results are displayed)   EKG  EKG Interpretation None       Radiology   Procedures Procedures (including critical care time)  Medications Ordered in ED Medications  ondansetron (ZOFRAN) injection 4 mg (4 mg Intravenous Given 09/29/15 1458)  fentaNYL (SUBLIMAZE) injection 50 mcg (50 mcg Intravenous Given 09/29/15 1458)  0.9 %  sodium chloride infusion ( Intravenous New Bag/Given 09/29/15 1458)  diatrizoate meglumine-sodium (GASTROGRAFIN) 66-10 % solution (30 mLs  Given 09/29/15 1522)  iopamidol (ISOVUE-300) 61 % injection 100 mL (100 mLs Intravenous Contrast Given 09/29/15 1620)     Initial Impression / Assessment and Plan / ED Course  I have reviewed the triage vital signs and the nursing notes.  Pertinent labs & imaging results that were available during my care of the patient were reviewed by me and  considered in my medical decision making (see chart for details).  MDM Reviewed: previous chart, nursing note and vitals Reviewed previous: labs Interpretation: labs and CT scan    Results for orders placed or performed during the hospital encounter of 09/29/15  Lipase, blood  Result Value Ref Range   Lipase 23 11 - 51 U/L  Comprehensive metabolic panel  Result Value Ref Range   Sodium 135 135 - 145 mmol/L   Potassium 3.6 3.5 - 5.1 mmol/L   Chloride 102 101 - 111 mmol/L   CO2 21 (L) 22 - 32 mmol/L   Glucose, Bld 282 (H) 65 - 99 mg/dL   BUN 13 6 - 20 mg/dL   Creatinine, Ser 1.04 (H) 0.44 - 1.00 mg/dL   Calcium 9.0 8.9 - 10.3 mg/dL   Total Protein 7.4 6.5 - 8.1 g/dL   Albumin 3.7 3.5 - 5.0 g/dL   AST 44 (H) 15 - 41 U/L   ALT 33 14 - 54 U/L   Alkaline Phosphatase 104 38 - 126 U/L   Total Bilirubin 0.8 0.3 - 1.2 mg/dL   GFR calc non Af Amer >60 >60 mL/min   GFR calc Af Amer >60 >60 mL/min   Anion gap 12 5 - 15  CBC  Result Value Ref Range   WBC 9.8 4.0 - 10.5 K/uL   RBC 4.48 3.87 - 5.11 MIL/uL   Hemoglobin 14.3 12.0 - 15.0 g/dL   HCT 41.8 36.0 - 46.0 %   MCV 93.3 78.0 - 100.0 fL   MCH 31.9 26.0 - 34.0 pg   MCHC 34.2 30.0 - 36.0 g/dL   RDW 14.4 11.5 - 15.5 %   Platelets 85 (L) 150 - 400 K/uL  Urinalysis, Routine w reflex microscopic  Result Value Ref Range   Color, Urine AMBER (A) YELLOW   APPearance HAZY (A) CLEAR   Specific Gravity, Urine 1.015 1.005 - 1.030   pH 5.5 5.0 - 8.0   Glucose, UA >1000 (A) NEGATIVE mg/dL   Hgb urine dipstick TRACE (A) NEGATIVE   Bilirubin Urine NEGATIVE NEGATIVE   Ketones, ur TRACE (A) NEGATIVE mg/dL   Protein, ur TRACE (A) NEGATIVE mg/dL   Nitrite NEGATIVE NEGATIVE   Leukocytes, UA NEGATIVE NEGATIVE  Pregnancy, urine  Result Value Ref Range   Preg Test, Ur NEGATIVE NEGATIVE  Urine microscopic-add on  Result Value Ref Range   Squamous Epithelial /  LPF 6-30 (A) NONE SEEN   WBC, UA 0-5 0 - 5 WBC/hpf   RBC / HPF 0-5 0 - 5 RBC/hpf    Bacteria, UA FEW (A) NONE SEEN   Ct Abdomen Pelvis W Contrast Result Date: 09/29/2015 CLINICAL DATA:  Lower abdominal pain for 2 days radiating to epigastric region. Diarrhea. EXAM: CT ABDOMEN AND PELVIS WITH CONTRAST TECHNIQUE: Multidetector CT imaging of the abdomen and pelvis was performed using the standard protocol following bolus administration of intravenous contrast. CONTRAST:  154m ISOVUE-300 IOPAMIDOL (ISOVUE-300) INJECTION 61% COMPARISON:  11/18/2010 FINDINGS: Lower chest:  No acute findings. Hepatobiliary: Nodular capsular contour of liver seen, consistent with hepatic cirrhosis. No liver masses identified. Prior cholecystectomy noted. No evidence of biliary dilatation. Pancreas: No mass, inflammatory changes, or other significant abnormality. Spleen: Moderate splenomegaly, with spleen measuring approximately 16 cm in length compared to 11 cm on previous study. No splenic lesions identified. Adrenals/Urinary Tract: No masses identified. No evidence of hydronephrosis. Stomach/Bowel: Mild diffuse small bowel dilatation and wall thickening seen without evidence of transition point. This is suspicious for mild ileus and hypoalbuminemia in the setting of cirrhosis. No focal inflammatory process or abscess identified. Vascular/Lymphatic: No pathologically enlarged lymph nodes. No evidence of abdominal aortic aneurysm. Aortic atherosclerosis. Reproductive: No mass or other significant abnormality. Other: None. Musculoskeletal:  No suspicious bone lesions identified. IMPRESSION: Findings consistent with hepatic cirrhosis. No evidence of hepatic neoplasm. Moderate splenomegaly, consistent with portal venous hypertension. No evidence of ascites. Mild diffuse small bowel wall thickening and dilatation, without transition point. This is suspicious for mild ileus and hypoalbuminemia in the setting of cirrhosis. Aortic atherosclerosis. Electronically Signed   By: JEarle GellM.D.   On: 09/29/2015 16:41    1655:   Pt continues to c/o abd pain, despite IV fentanyl. Mild ileus on CT scan, platelets lower than baseline. Pt states she "can't go home like this."  T/C to [facility]. SJonnie Finner case discussed, including:  HPI, pertinent PM/SHx, VS/PE, dx testing, ED course and treatment:  Agreeable to admit, requests to write temporary orders, obtain observation medical bed to Dr. HLuan Pulling service.    Final Clinical Impressions(s) / ED Diagnoses   Final diagnoses:  None    New Prescriptions New Prescriptions   No medications on file     KFrancine Graven DO 10/02/15 1740

## 2015-09-29 NOTE — ED Notes (Signed)
Hospitalist at bedside 

## 2015-09-29 NOTE — ED Notes (Signed)
Pt unable to give the urine specimen at this time.

## 2015-09-29 NOTE — ED Triage Notes (Signed)
Pt reports lower abd pain x2 days radiating to epigastric area.  Pt having diarrhea, denies n/v. Pt denies urinary issues today.

## 2015-09-30 DIAGNOSIS — K7469 Other cirrhosis of liver: Secondary | ICD-10-CM | POA: Diagnosis not present

## 2015-09-30 DIAGNOSIS — R1084 Generalized abdominal pain: Secondary | ICD-10-CM | POA: Diagnosis not present

## 2015-09-30 DIAGNOSIS — K567 Ileus, unspecified: Secondary | ICD-10-CM | POA: Diagnosis not present

## 2015-09-30 DIAGNOSIS — G8929 Other chronic pain: Secondary | ICD-10-CM | POA: Diagnosis not present

## 2015-09-30 LAB — GLUCOSE, CAPILLARY
GLUCOSE-CAPILLARY: 111 mg/dL — AB (ref 65–99)
Glucose-Capillary: 111 mg/dL — ABNORMAL HIGH (ref 65–99)
Glucose-Capillary: 112 mg/dL — ABNORMAL HIGH (ref 65–99)
Glucose-Capillary: 107 mg/dL — ABNORMAL HIGH (ref 65–99)

## 2015-09-30 LAB — COMPREHENSIVE METABOLIC PANEL
ALBUMIN: 3 g/dL — AB (ref 3.5–5.0)
ALT: 22 U/L (ref 14–54)
AST: 20 U/L (ref 15–41)
Alkaline Phosphatase: 85 U/L (ref 38–126)
Anion gap: 4 — ABNORMAL LOW (ref 5–15)
BUN: 10 mg/dL (ref 6–20)
CHLORIDE: 109 mmol/L (ref 101–111)
CO2: 26 mmol/L (ref 22–32)
Calcium: 8.4 mg/dL — ABNORMAL LOW (ref 8.9–10.3)
Creatinine, Ser: 0.84 mg/dL (ref 0.44–1.00)
GFR calc Af Amer: >60 mL/min (ref >60)
GLUCOSE: 115 mg/dL — AB (ref 65–99)
POTASSIUM: 3.4 mmol/L — AB (ref 3.5–5.1)
Sodium: 139 mmol/L (ref 135–145)
Total Bilirubin: 0.7 mg/dL (ref 0.3–1.2)
Total Protein: 6.5 g/dL (ref 6.5–8.1)

## 2015-09-30 LAB — CBC
HEMATOCRIT: 38.6 % (ref 36.0–46.0)
Hemoglobin: 13.1 g/dL (ref 12.0–15.0)
MCH: 31.5 pg (ref 26.0–34.0)
MCHC: 33.9 g/dL (ref 30.0–36.0)
MCV: 92.8 fL (ref 78.0–100.0)
PLATELETS: 76 10*3/uL — AB (ref 150–400)
RBC: 4.16 MIL/uL (ref 3.87–5.11)
RDW: 14.5 % (ref 11.5–15.5)
WBC: 8 10*3/uL (ref 4.0–10.5)

## 2015-09-30 NOTE — Progress Notes (Signed)
Subjective: She was admitted with abdominal pain is not really clear what that is from. She has what appears to be cirrhosis with splenomegaly on her CT. She does have a history of IV drug abuse and alcohol abuse in the remote past. She is unaware of any history of hepatitis.  Objective: Vital signs in last 24 hours: Temp:  [97.8 F (36.6 C)-98.8 F (37.1 C)] 98.6 F (37 C) (09/04 5364) Pulse Rate:  [79-97] 79 (09/04 0607) Resp:  [16-18] 17 (09/04 0607) BP: (104-134)/(60-76) 115/76 (09/04 0607) SpO2:  [96 %-100 %] 98 % (09/04 0607) Weight:  [83 kg (183 lb)-87.6 kg (193 lb 1.6 oz)] 87.6 kg (193 lb 1.6 oz) (09/03 1851) Weight change:     Intake/Output from previous day: 09/03 0701 - 09/04 0700 In: 1700 [I.V.:1650; IV Piggyback:50] Out: -   PHYSICAL EXAM General appearance: alert, cooperative and mild distress Resp: clear to auscultation bilaterally Cardio: regular rate and rhythm, S1, S2 normal, no murmur, click, rub or gallop GI: She still has diffuse abdominal tenderness Extremities: extremities normal, atraumatic, no cyanosis or edema  Lab Results:  Results for orders placed or performed during the hospital encounter of 09/29/15 (from the past 48 hour(s))  Lipase, blood     Status: None   Collection Time: 09/29/15  2:37 PM  Result Value Ref Range   Lipase 23 11 - 51 U/L  Comprehensive metabolic panel     Status: Abnormal   Collection Time: 09/29/15  2:37 PM  Result Value Ref Range   Sodium 135 135 - 145 mmol/L   Potassium 3.6 3.5 - 5.1 mmol/L   Chloride 102 101 - 111 mmol/L   CO2 21 (L) 22 - 32 mmol/L   Glucose, Bld 282 (H) 65 - 99 mg/dL   BUN 13 6 - 20 mg/dL   Creatinine, Ser 1.04 (H) 0.44 - 1.00 mg/dL   Calcium 9.0 8.9 - 10.3 mg/dL   Total Protein 7.4 6.5 - 8.1 g/dL   Albumin 3.7 3.5 - 5.0 g/dL   AST 44 (H) 15 - 41 U/L   ALT 33 14 - 54 U/L   Alkaline Phosphatase 104 38 - 126 U/L   Total Bilirubin 0.8 0.3 - 1.2 mg/dL   GFR calc non Af Amer >60 >60 mL/min   GFR  calc Af Amer >60 >60 mL/min    Comment: (NOTE) The eGFR has been calculated using the CKD EPI equation. This calculation has not been validated in all clinical situations. eGFR's persistently <60 mL/min signify possible Chronic Kidney Disease.    Anion gap 12 5 - 15  CBC     Status: Abnormal   Collection Time: 09/29/15  2:37 PM  Result Value Ref Range   WBC 9.8 4.0 - 10.5 K/uL   RBC 4.48 3.87 - 5.11 MIL/uL   Hemoglobin 14.3 12.0 - 15.0 g/dL   HCT 41.8 36.0 - 46.0 %   MCV 93.3 78.0 - 100.0 fL   MCH 31.9 26.0 - 34.0 pg   MCHC 34.2 30.0 - 36.0 g/dL   RDW 14.4 11.5 - 15.5 %   Platelets 85 (L) 150 - 400 K/uL    Comment: SPECIMEN CHECKED FOR CLOTS PLATELET COUNT CONFIRMED BY SMEAR   Urinalysis, Routine w reflex microscopic     Status: Abnormal   Collection Time: 09/29/15  3:15 PM  Result Value Ref Range   Color, Urine AMBER (A) YELLOW    Comment: BIOCHEMICALS MAY BE AFFECTED BY COLOR   APPearance HAZY (A)  CLEAR   Specific Gravity, Urine 1.015 1.005 - 1.030   pH 5.5 5.0 - 8.0   Glucose, UA >1000 (A) NEGATIVE mg/dL   Hgb urine dipstick TRACE (A) NEGATIVE   Bilirubin Urine NEGATIVE NEGATIVE   Ketones, ur TRACE (A) NEGATIVE mg/dL   Protein, ur TRACE (A) NEGATIVE mg/dL   Nitrite NEGATIVE NEGATIVE   Leukocytes, UA NEGATIVE NEGATIVE  Pregnancy, urine     Status: None   Collection Time: 09/29/15  3:15 PM  Result Value Ref Range   Preg Test, Ur NEGATIVE NEGATIVE    Comment:        THE SENSITIVITY OF THIS METHODOLOGY IS >20 mIU/mL.   Urine microscopic-add on     Status: Abnormal   Collection Time: 09/29/15  3:15 PM  Result Value Ref Range   Squamous Epithelial / LPF 6-30 (A) NONE SEEN   WBC, UA 0-5 0 - 5 WBC/hpf   RBC / HPF 0-5 0 - 5 RBC/hpf   Bacteria, UA FEW (A) NONE SEEN  Lactic acid, plasma     Status: None   Collection Time: 09/29/15  9:45 PM  Result Value Ref Range   Lactic Acid, Venous 1.3 0.5 - 1.9 mmol/L  MRSA PCR Screening     Status: None   Collection Time:  09/29/15  9:45 PM  Result Value Ref Range   MRSA by PCR NEGATIVE NEGATIVE    Comment:        The GeneXpert MRSA Assay (FDA approved for NASAL specimens only), is one component of a comprehensive MRSA colonization surveillance program. It is not intended to diagnose MRSA infection nor to guide or monitor treatment for MRSA infections.   CBC     Status: Abnormal   Collection Time: 09/30/15  6:04 AM  Result Value Ref Range   WBC 8.0 4.0 - 10.5 K/uL   RBC 4.16 3.87 - 5.11 MIL/uL   Hemoglobin 13.1 12.0 - 15.0 g/dL   HCT 38.6 36.0 - 46.0 %   MCV 92.8 78.0 - 100.0 fL   MCH 31.5 26.0 - 34.0 pg   MCHC 33.9 30.0 - 36.0 g/dL   RDW 14.5 11.5 - 15.5 %   Platelets 76 (L) 150 - 400 K/uL  Comprehensive metabolic panel     Status: Abnormal   Collection Time: 09/30/15  6:04 AM  Result Value Ref Range   Sodium 139 135 - 145 mmol/L   Potassium 3.4 (L) 3.5 - 5.1 mmol/L   Chloride 109 101 - 111 mmol/L   CO2 26 22 - 32 mmol/L   Glucose, Bld 115 (H) 65 - 99 mg/dL   BUN 10 6 - 20 mg/dL   Creatinine, Ser 0.84 0.44 - 1.00 mg/dL   Calcium 8.4 (L) 8.9 - 10.3 mg/dL   Total Protein 6.5 6.5 - 8.1 g/dL   Albumin 3.0 (L) 3.5 - 5.0 g/dL   AST 20 15 - 41 U/L   ALT 22 14 - 54 U/L   Alkaline Phosphatase 85 38 - 126 U/L   Total Bilirubin 0.7 0.3 - 1.2 mg/dL   GFR calc non Af Amer >60 >60 mL/min   GFR calc Af Amer >60 >60 mL/min    Comment: (NOTE) The eGFR has been calculated using the CKD EPI equation. This calculation has not been validated in all clinical situations. eGFR's persistently <60 mL/min signify possible Chronic Kidney Disease.    Anion gap 4 (L) 5 - 15  Glucose, capillary     Status: Abnormal     Collection Time: 09/30/15  7:32 AM  Result Value Ref Range   Glucose-Capillary 111 (H) 65 - 99 mg/dL   Comment 1 Notify RN     ABGS No results for input(s): PHART, PO2ART, TCO2, HCO3 in the last 72 hours.  Invalid input(s): PCO2 CULTURES Recent Results (from the past 240 hour(s))  MRSA  PCR Screening     Status: None   Collection Time: 09/29/15  9:45 PM  Result Value Ref Range Status   MRSA by PCR NEGATIVE NEGATIVE Final    Comment:        The GeneXpert MRSA Assay (FDA approved for NASAL specimens only), is one component of a comprehensive MRSA colonization surveillance program. It is not intended to diagnose MRSA infection nor to guide or monitor treatment for MRSA infections.    Studies/Results: Ct Abdomen Pelvis W Contrast  Result Date: 09/29/2015 CLINICAL DATA:  Lower abdominal pain for 2 days radiating to epigastric region. Diarrhea. EXAM: CT ABDOMEN AND PELVIS WITH CONTRAST TECHNIQUE: Multidetector CT imaging of the abdomen and pelvis was performed using the standard protocol following bolus administration of intravenous contrast. CONTRAST:  100mL ISOVUE-300 IOPAMIDOL (ISOVUE-300) INJECTION 61% COMPARISON:  11/18/2010 FINDINGS: Lower chest:  No acute findings. Hepatobiliary: Nodular capsular contour of liver seen, consistent with hepatic cirrhosis. No liver masses identified. Prior cholecystectomy noted. No evidence of biliary dilatation. Pancreas: No mass, inflammatory changes, or other significant abnormality. Spleen: Moderate splenomegaly, with spleen measuring approximately 16 cm in length compared to 11 cm on previous study. No splenic lesions identified. Adrenals/Urinary Tract: No masses identified. No evidence of hydronephrosis. Stomach/Bowel: Mild diffuse small bowel dilatation and wall thickening seen without evidence of transition point. This is suspicious for mild ileus and hypoalbuminemia in the setting of cirrhosis. No focal inflammatory process or abscess identified. Vascular/Lymphatic: No pathologically enlarged lymph nodes. No evidence of abdominal aortic aneurysm. Aortic atherosclerosis. Reproductive: No mass or other significant abnormality. Other: None. Musculoskeletal:  No suspicious bone lesions identified. IMPRESSION: Findings consistent with hepatic  cirrhosis. No evidence of hepatic neoplasm. Moderate splenomegaly, consistent with portal venous hypertension. No evidence of ascites. Mild diffuse small bowel wall thickening and dilatation, without transition point. This is suspicious for mild ileus and hypoalbuminemia in the setting of cirrhosis. Aortic atherosclerosis. Electronically Signed   By: John  Stahl M.D.   On: 09/29/2015 16:41    Medications:  Prior to Admission:  Prescriptions Prior to Admission  Medication Sig Dispense Refill Last Dose  . INVOKAMET 150-1000 MG TABS Take 1 tablet by mouth 2 (two) times daily.   09/29/2015 at Unknown time  . Ketamine HCl POWD Apply 1 application topically daily as needed (pain in back).    unknown  . omeprazole (PRILOSEC) 20 MG capsule Take 20 mg by mouth daily as needed (heartburn).   Past Week at Unknown time  . oxyCODONE (ROXICODONE) 15 MG immediate release tablet Take 1 tablet by mouth 3 (three) times daily.    09/29/2015 at Unknown time  . pantoprazole (PROTONIX) 40 MG tablet Take 40 mg by mouth daily.    09/29/2015 at Unknown time  . pravastatin (PRAVACHOL) 20 MG tablet Take 20 mg by mouth daily.    09/29/2015 at Unknown time  . SPIRIVA HANDIHALER 18 MCG inhalation capsule Place 18 mcg into inhaler and inhale daily as needed (sob).    unknown   Scheduled: . enoxaparin (LOVENOX) injection  40 mg Subcutaneous Q24H  . famotidine (PEPCID) IV  20 mg Intravenous Q12H  . insulin aspart  0-9   Units Subcutaneous TID WC  . nicotine  21 mg Transdermal Once  . pravastatin  20 mg Oral q1800   Continuous: . sodium chloride 150 mL/hr at 09/29/15 2000   GYB:[id number] **OR** acetaminophen, morphine injection, ondansetron **OR** ondansetron (ZOFRAN) IV, oxyCODONE, tiotropium  Assesment: She has abdominal pain. She has what looks like an ileus. She has chronic pain and is dependent on opiates for that. She has diabetes which is pretty well controlled. She has a new diagnosis of cirrhosis of the liver based on  CT. She has splenomegaly and thrombocytopenia. Her abdominal pain is better but not gone Principal Problem:   Abdominal pain Active Problems:   DM (diabetes mellitus), type 2 ([place])   TOBACCO USER   Backache   Opiate dependence ([place])   Ileus ([facility])   Diarrhea   Cirrhosis of liver ([facility])- by imaging CT scan    Plan: GI consult. Continue current treatments. Check for hepatitis    LOS: 0 days   Merlin Golden L 09/30/2015, 10:07 AM

## 2015-09-30 NOTE — Progress Notes (Deleted)
2036 received call from [facility] from [facility] who reported that no technician is available to come tonight to insert PICC line because it's after holiday hours that ended at [place]. Barnetta Chapel reported that a technician could possibly arrive around 10:30am to 11:00am in the morning to insert PICC line. AC and mid-level made aware.

## 2015-10-01 ENCOUNTER — Observation Stay (HOSPITAL_COMMUNITY): Payer: Medicare Other

## 2015-10-01 ENCOUNTER — Encounter (HOSPITAL_COMMUNITY): Payer: Self-pay | Admitting: Gastroenterology

## 2015-10-01 DIAGNOSIS — K7469 Other cirrhosis of liver: Secondary | ICD-10-CM | POA: Diagnosis not present

## 2015-10-01 DIAGNOSIS — R1084 Generalized abdominal pain: Secondary | ICD-10-CM

## 2015-10-01 DIAGNOSIS — K567 Ileus, unspecified: Secondary | ICD-10-CM | POA: Diagnosis not present

## 2015-10-01 LAB — COMPREHENSIVE METABOLIC PANEL
ALBUMIN: 3.3 g/dL — AB (ref 3.5–5.0)
ALK PHOS: 95 U/L (ref 38–126)
ALT: 18 U/L (ref 14–54)
AST: 17 U/L (ref 15–41)
Anion gap: 9 (ref 5–15)
BILIRUBIN TOTAL: 0.8 mg/dL (ref 0.3–1.2)
BUN: 10 mg/dL (ref 6–20)
CALCIUM: 8.8 mg/dL — AB (ref 8.9–10.3)
CO2: 24 mmol/L (ref 22–32)
CREATININE: 0.93 mg/dL (ref 0.44–1.00)
Chloride: 106 mmol/L (ref 101–111)
GFR calc Af Amer: >60 mL/min (ref >60)
GFR calc non Af Amer: >60 mL/min (ref >60)
GLUCOSE: 128 mg/dL — AB (ref 65–99)
Potassium: 3.8 mmol/L (ref 3.5–5.1)
Sodium: 139 mmol/L (ref 135–145)
TOTAL PROTEIN: 7.4 g/dL (ref 6.5–8.1)

## 2015-10-01 LAB — HEPATITIS PANEL, ACUTE
HCV Ab: >11 s/co ratio — ABNORMAL HIGH (ref 0.0–0.9)
HEP B C IGM: NEGATIVE
HEP B S AG: NEGATIVE
Hep A IgM: NEGATIVE

## 2015-10-01 LAB — GLUCOSE, CAPILLARY
GLUCOSE-CAPILLARY: 105 mg/dL — AB (ref 65–99)
GLUCOSE-CAPILLARY: 140 mg/dL — AB (ref 65–99)
Glucose-Capillary: 138 mg/dL — ABNORMAL HIGH (ref 65–99)
Glucose-Capillary: 150 mg/dL — ABNORMAL HIGH (ref 65–99)

## 2015-10-01 LAB — HEPATITIS C ANTIBODY (REFLEX): HCV Ab: >11 s/co ratio — ABNORMAL HIGH (ref 0.0–0.9)

## 2015-10-01 LAB — PROTIME-INR
INR: 0.99
Prothrombin Time: 13.1 seconds (ref 11.4–15.2)

## 2015-10-01 LAB — HEPATITIS B SURFACE ANTIBODY,QUALITATIVE: HEP B S AB: NONREACTIVE

## 2015-10-01 LAB — HEPATITIS A ANTIBODY, TOTAL: Hep A Total Ab: POSITIVE — AB

## 2015-10-01 LAB — COMMENT2 - HEP PANEL

## 2015-10-01 MED ORDER — PANTOPRAZOLE SODIUM 40 MG PO TBEC
40.0000 mg | DELAYED_RELEASE_TABLET | Freq: Two times a day (BID) | ORAL | Status: DC
  Administered 2015-10-01 – 2015-10-03 (×5): 40 mg via ORAL
  Filled 2015-10-01 (×5): qty 1

## 2015-10-01 MED ORDER — POTASSIUM CHLORIDE CRYS ER 20 MEQ PO TBCR
40.0000 meq | EXTENDED_RELEASE_TABLET | Freq: Two times a day (BID) | ORAL | Status: DC
  Administered 2015-10-01 – 2015-10-03 (×5): 40 meq via ORAL
  Filled 2015-10-01 (×5): qty 2

## 2015-10-01 MED ORDER — PANTOPRAZOLE SODIUM 40 MG PO TBEC: 40.0000 mg | DELAYED_RELEASE_TABLET | Freq: Two times a day (BID) | ORAL | Status: DC

## 2015-10-01 MED ORDER — TIOTROPIUM BROMIDE MONOHYDRATE 18 MCG IN CAPS
18.0000 ug | ORAL_CAPSULE | Freq: Every day | RESPIRATORY_TRACT | Status: DC
  Administered 2015-10-02: 18 ug via RESPIRATORY_TRACT
  Filled 2015-10-01: qty 5

## 2015-10-01 MED ORDER — NICOTINE 21 MG/24HR TD PT24
21.0000 mg | MEDICATED_PATCH | Freq: Every day | TRANSDERMAL | Status: DC
  Administered 2015-10-01 – 2015-10-03 (×3): 21 mg via TRANSDERMAL
  Filled 2015-10-01 (×3): qty 1

## 2015-10-01 MED ORDER — ALPRAZOLAM 0.25 MG PO TABS
0.2500 mg | ORAL_TABLET | Freq: Three times a day (TID) | ORAL | Status: DC | PRN
  Administered 2015-10-01 – 2015-10-03 (×4): 0.25 mg via ORAL
  Filled 2015-10-01 (×4): qty 1

## 2015-10-01 MED ORDER — FUROSEMIDE 10 MG/ML IJ SOLN
40.0000 mg | Freq: Once | INTRAMUSCULAR | Status: AC
  Administered 2015-10-01: 40 mg via INTRAVENOUS
  Filled 2015-10-01: qty 4

## 2015-10-01 MED ORDER — POLYETHYLENE GLYCOL 3350 17 G PO PACK
17.0000 g | PACK | Freq: Every day | ORAL | Status: DC | PRN
  Administered 2015-10-02 – 2015-10-03 (×2): 17 g via ORAL
  Filled 2015-10-01 (×2): qty 1

## 2015-10-01 NOTE — Progress Notes (Signed)
Patient placed on Enteric Isolation per MD order. Patient,and family educated on Isolation,educational handouts given,patient,and family verbalized understanding.

## 2015-10-01 NOTE — Progress Notes (Signed)
Subjective: She says she is having more problem with abdominal discomfort this morning. She had trouble with venous access but does have an IV now. We discussed her laboratory work which shows that she does have hepatitis C GI consult has been requested and is underway  Objective: Vital signs in last 24 hours: Temp:  [98 F (36.7 C)-98.8 F (37.1 C)] 98 F (36.7 C) (09/05 0607) Pulse Rate:  [73-87] 73 (09/05 0607) Resp:  [18-20] 18 (09/05 0607) BP: (115-149)/(75-91) 147/91 (09/05 0607) SpO2:  [95 %-100 %] 100 % (09/05 0607) Weight:  [87.5 kg (192 lb 12.8 oz)] 87.5 kg (192 lb 12.8 oz) (09/05 0500) Weight change: 4.445 kg (9 lb 12.8 oz) Last BM Date: 09/30/15  Intake/Output from previous day: 09/04 0701 - 09/05 0700 In: 3762.5 [I.V.:3762.5] Out: -   PHYSICAL EXAM General appearance: alert, cooperative and mild distress Resp: clear to auscultation bilaterally Cardio: regular rate and rhythm, S1, S2 normal, no murmur, click, rub or gallop GI: Still with diffuse tenderness more in the right lower quadrant area Extremities: extremities normal, atraumatic, no cyanosis or edema  Lab Results:  Results for orders placed or performed during the hospital encounter of 09/29/15 (from the past 48 hour(s))  Lipase, blood     Status: None   Collection Time: 09/29/15  2:37 PM  Result Value Ref Range   Lipase 23 11 - 51 U/L  Comprehensive metabolic panel     Status: Abnormal   Collection Time: 09/29/15  2:37 PM  Result Value Ref Range   Sodium 135 135 - 145 mmol/L   Potassium 3.6 3.5 - 5.1 mmol/L   Chloride 102 101 - 111 mmol/L   CO2 21 (L) 22 - 32 mmol/L   Glucose, Bld 282 (H) 65 - 99 mg/dL   BUN 13 6 - 20 mg/dL   Creatinine, Ser 1.04 (H) 0.44 - 1.00 mg/dL   Calcium 9.0 8.9 - 10.3 mg/dL   Total Protein 7.4 6.5 - 8.1 g/dL   Albumin 3.7 3.5 - 5.0 g/dL   AST 44 (H) 15 - 41 U/L   ALT 33 14 - 54 U/L   Alkaline Phosphatase 104 38 - 126 U/L   Total Bilirubin 0.8 0.3 - 1.2 mg/dL   GFR calc  non Af Amer >60 >60 mL/min   GFR calc Af Amer >60 >60 mL/min    Comment: (NOTE) The eGFR has been calculated using the CKD EPI equation. This calculation has not been validated in all clinical situations. eGFR's persistently <60 mL/min signify possible Chronic Kidney Disease.    Anion gap 12 5 - 15  CBC     Status: Abnormal   Collection Time: 09/29/15  2:37 PM  Result Value Ref Range   WBC 9.8 4.0 - 10.5 K/uL   RBC 4.48 3.87 - 5.11 MIL/uL   Hemoglobin 14.3 12.0 - 15.0 g/dL   HCT 41.8 36.0 - 46.0 %   MCV 93.3 78.0 - 100.0 fL   MCH 31.9 26.0 - 34.0 pg   MCHC 34.2 30.0 - 36.0 g/dL   RDW 14.4 11.5 - 15.5 %   Platelets 85 (L) 150 - 400 K/uL    Comment: SPECIMEN CHECKED FOR CLOTS PLATELET COUNT CONFIRMED BY SMEAR   Urinalysis, Routine w reflex microscopic     Status: Abnormal   Collection Time: 09/29/15  3:15 PM  Result Value Ref Range   Color, Urine AMBER (A) YELLOW    Comment: BIOCHEMICALS MAY BE AFFECTED BY COLOR   APPearance  HAZY (A) CLEAR   Specific Gravity, Urine 1.015 1.005 - 1.030   pH 5.5 5.0 - 8.0   Glucose, UA >1000 (A) NEGATIVE mg/dL   Hgb urine dipstick TRACE (A) NEGATIVE   Bilirubin Urine NEGATIVE NEGATIVE   Ketones, ur TRACE (A) NEGATIVE mg/dL   Protein, ur TRACE (A) NEGATIVE mg/dL   Nitrite NEGATIVE NEGATIVE   Leukocytes, UA NEGATIVE NEGATIVE  Pregnancy, urine     Status: None   Collection Time: 09/29/15  3:15 PM  Result Value Ref Range   Preg Test, Ur NEGATIVE NEGATIVE    Comment:        THE SENSITIVITY OF THIS METHODOLOGY IS >20 mIU/mL.   Urine microscopic-add on     Status: Abnormal   Collection Time: 09/29/15  3:15 PM  Result Value Ref Range   Squamous Epithelial / LPF 6-30 (A) NONE SEEN   WBC, UA 0-5 0 - 5 WBC/hpf   RBC / HPF 0-5 0 - 5 RBC/hpf   Bacteria, UA FEW (A) NONE SEEN  Hepatitis panel, acute     Status: Abnormal   Collection Time: 09/29/15  9:45 PM  Result Value Ref Range   Hepatitis B Surface Ag Negative Negative   HCV Ab >11.0 (H) 0.0  - 0.9 s/co ratio    Comment: (NOTE)                                  Negative:     < 0.8                             Indeterminate: 0.8 - 0.9                                  Positive:     > 0.9 The CDC recommends that a positive HCV antibody result be followed up with a HCV Nucleic Acid Amplification test (983382). Performed At: [facility] [place], [place] [id number] Lindon Romp MD [id number]    Hep A IgM Negative Negative   Hep B C IgM Negative Negative  Lactic acid, plasma     Status: None   Collection Time: 09/29/15  9:45 PM  Result Value Ref Range   Lactic Acid, Venous 1.3 0.5 - 1.9 mmol/L  MRSA PCR Screening     Status: None   Collection Time: 09/29/15  9:45 PM  Result Value Ref Range   MRSA by PCR NEGATIVE NEGATIVE    Comment:        The GeneXpert MRSA Assay (FDA approved for NASAL specimens only), is one component of a comprehensive MRSA colonization surveillance program. It is not intended to diagnose MRSA infection nor to guide or monitor treatment for MRSA infections.   CBC     Status: Abnormal   Collection Time: 09/30/15  6:04 AM  Result Value Ref Range   WBC 8.0 4.0 - 10.5 K/uL   RBC 4.16 3.87 - 5.11 MIL/uL   Hemoglobin 13.1 12.0 - 15.0 g/dL   HCT 38.6 36.0 - 46.0 %   MCV 92.8 78.0 - 100.0 fL   MCH 31.5 26.0 - 34.0 pg   MCHC 33.9 30.0 - 36.0 g/dL   RDW 14.5 11.5 - 15.5 %   Platelets 76 (L) 150 - 400 K/uL  Comprehensive metabolic panel  Status: Abnormal   Collection Time: 09/30/15  6:04 AM  Result Value Ref Range   Sodium 139 135 - 145 mmol/L   Potassium 3.4 (L) 3.5 - 5.1 mmol/L   Chloride 109 101 - 111 mmol/L   CO2 26 22 - 32 mmol/L   Glucose, Bld 115 (H) 65 - 99 mg/dL   BUN 10 6 - 20 mg/dL   Creatinine, Ser 0.84 0.44 - 1.00 mg/dL   Calcium 8.4 (L) 8.9 - 10.3 mg/dL   Total Protein 6.5 6.5 - 8.1 g/dL   Albumin 3.0 (L) 3.5 - 5.0 g/dL   AST 20 15 - 41 U/L   ALT 22 14 - 54 U/L   Alkaline Phosphatase 85 38 - 126  U/L   Total Bilirubin 0.7 0.3 - 1.2 mg/dL   GFR calc non Af Amer >60 >60 mL/min   GFR calc Af Amer >60 >60 mL/min    Comment: (NOTE) The eGFR has been calculated using the CKD EPI equation. This calculation has not been validated in all clinical situations. eGFR's persistently <60 mL/min signify possible Chronic Kidney Disease.    Anion gap 4 (L) 5 - 15  Glucose, capillary     Status: Abnormal   Collection Time: 09/30/15  7:32 AM  Result Value Ref Range   Glucose-Capillary 111 (H) 65 - 99 mg/dL   Comment 1 Notify RN   Hepatitis c antibody (reflex)     Status: Abnormal   Collection Time: 09/30/15 10:33 AM  Result Value Ref Range   HCV Ab >11.0 (H) 0.0 - 0.9 s/co ratio    Comment: (NOTE) Performed At: [facility] [address] [place], [place] [id number] Lindon Romp MD [id number]   Hepatitis A antibody, total     Status: Abnormal   Collection Time: 09/30/15 10:33 AM  Result Value Ref Range   Hep A Total Ab Positive (A) Negative    Comment: (NOTE) Performed At: [facility] [place], [place] [id number] Lindon Romp MD [id number]   Hepatitis B surface antibody     Status: None   Collection Time: 09/30/15 10:33 AM  Result Value Ref Range   Hep B S Ab Non Reactive     Comment: (NOTE)              Non Reactive: Inconsistent with immunity,                            less than 10 mIU/mL              Reactive:     Consistent with immunity,                            greater than 9.9 mIU/mL Performed At: [facility] [place], [place] [id number] Lindon Romp MD [id number]   Comment2 - Hep panel     Status: None   Collection Time: 09/30/15 10:33 AM  Result Value Ref Range   COMMENT HCV-2 Comment     Comment: (NOTE) Strong reactive antibody screen (s/c ratio >10.9) is consistent with past or present HCV infection.  Follow-up testing by HCV, Quantitative, Real time PCR (#[id number]) is recommended to  determine viral load/diagnosis of current HCV infection. Performed At: [facility] [place], [place] [id number] Lindon Romp MD [id number]   Glucose, capillary     Status: Abnormal  Collection Time: 09/30/15 11:46 AM  Result Value Ref Range   Glucose-Capillary 112 (H) 65 - 99 mg/dL   Comment 1 Notify RN   Glucose, capillary     Status: Abnormal   Collection Time: 09/30/15  4:46 PM  Result Value Ref Range   Glucose-Capillary 107 (H) 65 - 99 mg/dL  Glucose, capillary     Status: Abnormal   Collection Time: 09/30/15  9:05 PM  Result Value Ref Range   Glucose-Capillary 111 (H) 65 - 99 mg/dL   Comment 1 Notify RN   Glucose, capillary     Status: Abnormal   Collection Time: 10/01/15  8:30 AM  Result Value Ref Range   Glucose-Capillary 105 (H) 65 - 99 mg/dL   Comment 1 Notify RN    Comment 2 Document in Chart     ABGS No results for input(s): PHART, PO2ART, TCO2, HCO3 in the last 72 hours.  Invalid input(s): PCO2 CULTURES Recent Results (from the past 240 hour(s))  MRSA PCR Screening     Status: None   Collection Time: 09/29/15  9:45 PM  Result Value Ref Range Status   MRSA by PCR NEGATIVE NEGATIVE Final    Comment:        The GeneXpert MRSA Assay (FDA approved for NASAL specimens only), is one component of a comprehensive MRSA colonization surveillance program. It is not intended to diagnose MRSA infection nor to guide or monitor treatment for MRSA infections.    Studies/Results: Ct Abdomen Pelvis W Contrast  Result Date: 09/29/2015 CLINICAL DATA:  Lower abdominal pain for 2 days radiating to epigastric region. Diarrhea. EXAM: CT ABDOMEN AND PELVIS WITH CONTRAST TECHNIQUE: Multidetector CT imaging of the abdomen and pelvis was performed using the standard protocol following bolus administration of intravenous contrast. CONTRAST:  1101m ISOVUE-300 IOPAMIDOL (ISOVUE-300) INJECTION 61% COMPARISON:  11/18/2010 FINDINGS: Lower chest:  No acute  findings. Hepatobiliary: Nodular capsular contour of liver seen, consistent with hepatic cirrhosis. No liver masses identified. Prior cholecystectomy noted. No evidence of biliary dilatation. Pancreas: No mass, inflammatory changes, or other significant abnormality. Spleen: Moderate splenomegaly, with spleen measuring approximately 16 cm in length compared to 11 cm on previous study. No splenic lesions identified. Adrenals/Urinary Tract: No masses identified. No evidence of hydronephrosis. Stomach/Bowel: Mild diffuse small bowel dilatation and wall thickening seen without evidence of transition point. This is suspicious for mild ileus and hypoalbuminemia in the setting of cirrhosis. No focal inflammatory process or abscess identified. Vascular/Lymphatic: No pathologically enlarged lymph nodes. No evidence of abdominal aortic aneurysm. Aortic atherosclerosis. Reproductive: No mass or other significant abnormality. Other: None. Musculoskeletal:  No suspicious bone lesions identified. IMPRESSION: Findings consistent with hepatic cirrhosis. No evidence of hepatic neoplasm. Moderate splenomegaly, consistent with portal venous hypertension. No evidence of ascites. Mild diffuse small bowel wall thickening and dilatation, without transition point. This is suspicious for mild ileus and hypoalbuminemia in the setting of cirrhosis. Aortic atherosclerosis. Electronically Signed   By: JEarle GellM.D.   On: 09/29/2015 16:41    Medications:  Prior to Admission:  Prescriptions Prior to Admission  Medication Sig Dispense Refill Last Dose  . INVOKAMET [id number] MG TABS Take 1 tablet by mouth 2 (two) times daily.   09/29/2015 at Unknown time  . Ketamine HCl POWD Apply 1 application topically daily as needed (pain in back).    unknown  . omeprazole (PRILOSEC) 20 MG capsule Take 20 mg by mouth daily as needed (heartburn).   Past Week at Unknown time  . oxyCODONE (  ROXICODONE) 15 MG immediate release tablet Take 1 tablet by mouth  3 (three) times daily.    09/29/2015 at Unknown time  . pantoprazole (PROTONIX) 40 MG tablet Take 40 mg by mouth daily.    09/29/2015 at Unknown time  . pravastatin (PRAVACHOL) 20 MG tablet Take 20 mg by mouth daily.    09/29/2015 at Unknown time  . SPIRIVA HANDIHALER 18 MCG inhalation capsule Place 18 mcg into inhaler and inhale daily as needed (sob).    unknown   Scheduled: . enoxaparin (LOVENOX) injection  40 mg Subcutaneous Q24H  . famotidine (PEPCID) IV  20 mg Intravenous Q12H  . furosemide  40 mg Intravenous Once  . insulin aspart  0-9 Units Subcutaneous TID WC  . potassium chloride  40 mEq Oral BID  . pravastatin  20 mg Oral q1800   Continuous: . sodium chloride 150 mL/hr at 10/01/15 0125   INO:[id number] **OR** acetaminophen, morphine injection, ondansetron **OR** ondansetron (ZOFRAN) IV, oxyCODONE, tiotropium  Assesment: She has abdominal pain and it looks like she probably has ileus. She has chronic neck and back pain on chronic opiates which complicates the situation. She has cirrhosis of the liver by CT scanning and is positive for hepatitis C. Principal Problem:   Abdominal pain Active Problems:   DM (diabetes mellitus), type 2 ([place])   TOBACCO USER   Backache   Opiate dependence ([place])   Ileus ([facility])   Diarrhea   Cirrhosis of liver ([place])- by imaging CT scan    Plan: Continue treatments. GI consultation    LOS: 0 days   Jabier Deese L 10/01/2015, 8:49 AM

## 2015-10-01 NOTE — Care Management Obs Status (Signed)
[place] NOTIFICATION   Patient Details  Name: Jamie Bell MRN: [id number] Date of Birth: 26-Mar-1970   Medicare Observation Status Notification Given:  Yes    Teller Wake[place], Chauncey [place], RN 10/01/2015, 10:55 AM

## 2015-10-01 NOTE — Progress Notes (Signed)
Abdominal xray reviewed. Moderate stool burden noted. Mild gaseous distension of colon. Will start on Miralax once daily prn.   Jamie Bell, ANP-BC [facility]

## 2015-10-01 NOTE — Consult Note (Signed)
Referring Provider: Dr. Luan Pulling  Primary Care Physician:  Alonza Bogus, MD Primary Gastroenterologist:  Dr. Oneida Alar   Date of Admission: 09/29/15 Date of Consultation: 10/01/15  Reason for Consultation:    HPI:  Jamie Bell is a 45 y.o. year old female presenting with abdominal pain, diarrhea, and CT showing cirrhosis, moderate splenomegaly, no ascites, and mild diffuse small bowel wall thickening and dilatation without transition point. Suspicious for mild ileus and hypoalbuminemia in the setting of cirrhosis. Hep C antibody positive. Total Hep at antibody positive consistent with immunity to Hepatitis A. LFTs unremarkable. Last imaging of liver in July 2014 with possible mild hepatomegaly. Patient states she has never been told she has hepatitis C, although this was positive in 2013. No prior known history of cirrhosis. Quite tearful at time of visit.   LLQ pain "down in my ovaries" started last week. Moved up to her belly button over the span of last week. Now is all over belly and moreso RUQ. States her pain has been more over entire belly since yesterday. Pain is constant. Unchanged with eating. Able to eat. "I don't understand it". No N/V. +flatus. No further diarrhea since admission.   Saturday morning, when trying to have a BM, "butt hole would just hurt". Diarrhea "all day yesterday" to the point where I had to change clothes. Would cough and had to change clothes. No diarrhea today. Diarrhea acute onset on Sunday. About 20, watery. No recent antibiotics. No sick contacts. Had issues with indigestion/reflux on Friday/Saturday. 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 Some bright red blood on tissue. Has been taking BC powders routinely, can't quantify how much. No prior colonoscopy. Last EGD in 2007 with mild antral erythema.  History of IV drug use 10-15 years ago. No prior blood transfusion or tattoos. Patient denies ETOH abuse but reports social ETOH use. Now will drink a beer every  6 months.   States her father has a history of cirrhosis that she believes is secondary to a fatty liver. He also drinks alcohol. She denies any family history of colon cancer.   Past Medical History:  Diagnosis Date  . Chronic back pain   . DDD (degenerative disc disease)   . Decreased hearing on the right.   F/u by ENT in the past and no further management.  . Depression   . Diabetes mellitus   . GERD (gastroesophageal reflux disease)   . Hypertension    took med. for HTN, several yrs. ago, no longer needs , seen by Dr. Lattie Haw last yr. during a hosp. at [facility], for MRSA bacteremia   . MRSA bacteremia 2014   Tx with Vancomycin  . PCOS (polycystic ovarian syndrome)   . Polysubstance abuse   . Sciatica    HNP- Cerv. 7- T2    Past Surgical History:  Procedure Laterality Date  . BACK SURGERY    . BREAST SURGERY Left    blocked milk ducts   . CARPAL TUNNEL RELEASE    . CATARACT EXTRACTION W/PHACO Left 02/04/2015   Procedure: CATARACT EXTRACTION PHACO AND INTRAOCULAR LENS PLACEMENT (IOC);  Surgeon: [name], MD;  Location: [facility];  Service: Ophthalmology;  Laterality: Left;  CDE: 4.11  . CHOLECYSTECTOMY    . ESOPHAGOGASTRODUODENOSCOPY  2007   Dr. Oneida Alar: mild antral erythema. Future procedures need to be done with Propofol per notes.   Marland Kitchen NECK SURGERY    . PERIPHERALLY INSERTED CENTRAL CATHETER INSERTION Right 2007   for treatment with Vancomycin   .  POSTERIOR CERVICAL LAMINECTOMY N/A 11/22/2013   Procedure: POSTERIOR CERVICAL LAMINECTOMY C7/T1, T/1-2;  Surgeon: [name], MD;  Location: [place] [facility];  Service: Neurosurgery;  Laterality: N/A;  POSTERIOR CERVICAL LAMINECTOMY C7/T1, T/1-2  . TEE WITHOUT CARDIOVERSION N/A 08/09/2012   Procedure: TRANSESOPHAGEAL ECHOCARDIOGRAM (TEE);  Surgeon: [name], MD;  Location: [facility];  Service: Cardiovascular;  Laterality: N/A;    Prior to Admission medications   Medication Sig Start Date End Date Taking? Authorizing Provider   INVOKAMET [phone] MG TABS Take 1 tablet by mouth 2 (two) times daily. 09/03/15  Yes Historical Provider, MD  Ketamine HCl POWD Apply 1 application topically daily as needed (pain in back).  12/31/14  Yes Historical Provider, MD  omeprazole (PRILOSEC) 20 MG capsule Take 20 mg by mouth daily as needed (heartburn).   Yes Historical Provider, MD  oxyCODONE (ROXICODONE) 15 MG immediate release tablet Take 1 tablet by mouth 3 (three) times daily.  09/03/15  Yes Historical Provider, MD  pantoprazole (PROTONIX) 40 MG tablet Take 40 mg by mouth daily.  12/31/14  Yes Historical Provider, MD  pravastatin (PRAVACHOL) 20 MG tablet Take 20 mg by mouth daily.  12/31/14  Yes Historical Provider, MD  SPIRIVA HANDIHALER 18 MCG inhalation capsule Place 18 mcg into inhaler and inhale daily as needed (sob).  12/31/14  Yes Historical Provider, MD    Current Facility-Administered Medications  Medication Dose Route Frequency Provider Last Rate Last Dose  . 0.9 %  sodium chloride infusion   Intravenous Continuous Roney Jaffe, MD 150 mL/hr at 10/01/15 0125    . acetaminophen (TYLENOL) tablet 650 mg  650 mg Oral Q6H PRN Roney Jaffe, MD   650 mg at 09/29/15 2155   Or  . acetaminophen (TYLENOL) suppository 650 mg  650 mg Rectal Q6H PRN Roney Jaffe, MD      . enoxaparin (LOVENOX) injection 40 mg  40 mg Subcutaneous Q24H Roney Jaffe, MD   40 mg at 09/30/15 2119  . insulin aspart (novoLOG) injection 0-9 Units  0-9 Units Subcutaneous TID WC Roney Jaffe, MD      . morphine 2 MG/ML injection 2 mg  2 mg Intravenous Q3H PRN Roney Jaffe, MD   2 mg at 10/01/15 1014  . ondansetron (ZOFRAN) tablet 4 mg  4 mg Oral Q6H PRN Roney Jaffe, MD       Or  . ondansetron [facility]) injection 4 mg  4 mg Intravenous Q6H PRN Roney Jaffe, MD      . oxyCODONE (Oxy IR/ROXICODONE) immediate release tablet 15 mg  15 mg Oral TID PRN Roney Jaffe, MD   15 mg at 10/01/15 0601  . pantoprazole (PROTONIX) EC tablet 40 mg  40 mg Oral BID AC  Annitta Needs, NP      . potassium chloride SA (K-DUR,KLOR-CON) CR tablet 40 mEq  40 mEq Oral BID Sinda Du, MD   40 mEq at 10/01/15 1014  . pravastatin (PRAVACHOL) tablet 20 mg  20 mg Oral q1800 Roney Jaffe, MD   20 mg at 09/30/15 1822  . tiotropium (SPIRIVA) inhalation capsule 18 mcg  18 mcg Inhalation Daily PRN Roney Jaffe, MD   18 mcg at 10/01/15 0151    Allergies as of 09/29/2015 - Review Complete 09/29/2015  Allergen Reaction Noted  . Ciprofloxacin Swelling 05/20/2005  . Nsaids Hives 05/02/2012    Family History  Problem Relation Age of Onset  . Hypertension Mother   . Hypertension Father   . Cirrhosis Father   .  Diabetes Brother   . Diabetes Other   . Colon cancer Neg Hx     Social History   Social History  . Marital status: Divorced    Spouse name: N/A  . Number of children: N/A  . Years of education: N/A   Occupational History  . Not on file.   Social History Main Topics  . Smoking status: Current Every Day Smoker    Packs/day: 1.50    Years: 20.00    Types: Cigarettes  . Smokeless tobacco: Never Used     Comment: pain pills  . Alcohol use Yes     Comment: rare beer once every 6 months   . Drug use: No     Comment: prior history of cocaine use, none in 15 years   . Sexual activity: Yes    Birth control/ protection: None, Abstinence   Other Topics Concern  . Not on file   Social History Narrative  . No narrative on file    Review of Systems: Gen: Denies fever, chills, loss of appetite, change in weight or weight loss CV: Denies chest pain, heart palpitations, syncope, edema  Resp: Denies shortness of breath with rest, cough, wheezing GI: see HPI  GU : Denies urinary burning, urinary frequency, urinary incontinence.  MS: left breast areola discomfort  Psych: Denies depression, anxiety,confusion, or memory loss Heme: Denies bruising, bleeding, and enlarged lymph nodes.  Physical Exam: Vital signs in last 24 hours: Temp:  [98 F (36.7  C)-98.8 F (37.1 C)] 98 F (36.7 C) (09/05 0607) Pulse Rate:  [73-87] 73 (09/05 0607) Resp:  [18-20] 18 (09/05 0607) BP: (115-149)/(75-91) 147/91 (09/05 0607) SpO2:  [95 %-100 %] 100 % (09/05 0607) Weight:  [192 lb 12.8 oz (87.5 kg)] 192 lb 12.8 oz (87.5 kg) (09/05 0500) Last BM Date: 09/30/15 General:   Alert,  Well-developed, well-nourished, tearful at times. Concerned about diagnosis.  Head:  Normocephalic and atraumatic. Eyes:  Sclera clear, no icterus.   Conjunctiva pink. Ears:  Normal auditory acuity. Nose:  No deformity, discharge,  or lesions. Mouth:  No deformity or lesions, dentition normal. Lungs:  Scattered rhonchi, diminished bases  Heart:  Regular rate and rhythm; no murmurs, clicks, rubs,  or gallops. Abdomen:  Soft, obese, diffusely TTP out of proportion to physical exam findings. TTP LUQ, epigastric, and RUQ moreso than lower abdomen  Rectal:  Deferred until time of colonoscopy.   Msk:  Symmetrical without gross deformities. Normal posture. Extremities:  Without edema. Neurologic:  Alert and  oriented x4 Skin:  Intact without significant lesions or rashes. Psych:  Alert and cooperative.   Intake/Output from previous day: 09/04 0701 - 09/05 0700 In: 3762.5 [I.V.:3762.5] Out: -  Intake/Output this shift: No intake/output data recorded.  Lab Results:  Recent Labs  09/29/15 1437 09/30/15 0604  WBC 9.8 8.0  HGB 14.3 13.1  HCT 41.8 38.6  PLT 85* 76*   BMET  Recent Labs  09/29/15 1437 09/30/15 0604  NA 135 139  K 3.6 3.4*  CL 102 109  CO2 21* 26  GLUCOSE 282* 115*  BUN 13 10  CREATININE 1.04* 0.84  CALCIUM 9.0 8.4*   LFT  Recent Labs  09/29/15 1437 09/30/15 0604  PROT 7.4 6.5  ALBUMIN 3.7 3.0*  AST 44* 20  ALT 33 22  ALKPHOS 104 85  BILITOT 0.8 0.7   Hepatitis Panel  Recent Labs  09/29/15 2145  HEPBSAG Negative  HCVAB >11.0*  HEPAIGM Negative  HEPBIGM Negative  Studies/Results: Ct Abdomen Pelvis W Contrast  Result Date:  09/29/2015 CLINICAL DATA:  Lower abdominal pain for 2 days radiating to epigastric region. Diarrhea. EXAM: CT ABDOMEN AND PELVIS WITH CONTRAST TECHNIQUE: Multidetector CT imaging of the abdomen and pelvis was performed using the standard protocol following bolus administration of intravenous contrast. CONTRAST:  188m ISOVUE-300 IOPAMIDOL (ISOVUE-300) INJECTION 61% COMPARISON:  11/18/2010 FINDINGS: Lower chest:  No acute findings. Hepatobiliary: Nodular capsular contour of liver seen, consistent with hepatic cirrhosis. No liver masses identified. Prior cholecystectomy noted. No evidence of biliary dilatation. Pancreas: No mass, inflammatory changes, or other significant abnormality. Spleen: Moderate splenomegaly, with spleen measuring approximately 16 cm in length compared to 11 cm on previous study. No splenic lesions identified. Adrenals/Urinary Tract: No masses identified. No evidence of hydronephrosis. Stomach/Bowel: Mild diffuse small bowel dilatation and wall thickening seen without evidence of transition point. This is suspicious for mild ileus and hypoalbuminemia in the setting of cirrhosis. No focal inflammatory process or abscess identified. Vascular/Lymphatic: No pathologically enlarged lymph nodes. No evidence of abdominal aortic aneurysm. Aortic atherosclerosis. Reproductive: No mass or other significant abnormality. Other: None. Musculoskeletal:  No suspicious bone lesions identified. IMPRESSION: Findings consistent with hepatic cirrhosis. No evidence of hepatic neoplasm. Moderate splenomegaly, consistent with portal venous hypertension. No evidence of ascites. Mild diffuse small bowel wall thickening and dilatation, without transition point. This is suspicious for mild ileus and hypoalbuminemia in the setting of cirrhosis. Aortic atherosclerosis. Electronically Signed   By: JEarle GellM.D.   On: 09/29/2015 16:41    Impression/Plan: 45year old female admitted with abdominal pain, diarrhea, and new  finding of cirrhosis. Clinically, diarrhea has resolved. Query self-limiting gastroenteritis but stool studies have been ordered for completeness' sake.   1. Abdominal pain: persists. Question of mild ileus on CT. She is tolerating her diet, no nausea or vomiting, +flatus. Her response is out of proportion to physical exam with palpation. Will order abdominal flat plate to ensure no acute changes. Doubt persisting or worsening changes. Counseled on avoidance of BC powders. Stop Pepcid IV and start Protonix 40 mg BID.   2. Diarrhea: agree with stool studies if diarrhea recurs. Appears this has resolved. Low-volume hematochezia likely benign but will need outpatient colonoscopy for further evaluation.   3. Cirrhosis: in setting of chronic Hepatitis C. Unable to exclude other factors to include fatty liver, +/- ETOH use. LFTs normal. Will check INR and repeat LFTs today to calculate MELD. Will need screening EGD as outpatient and have routine ultrasounds every 6 months. Immune to Hep A. Will need Hep B vaccination as outpatient.  4. Hepatitis C: will order genotype with viral load. [place]. No IV drug use in 10-15 years.    Will continue to follow with you.  AAnnitta Needs ANP-BC [facility]      LOS: 0 days    10/01/2015, 11:07 AM

## 2015-10-01 NOTE — Care Management Note (Signed)
Case Management Note  Patient Details  Name: CHRISLYNN MOSELY MRN: [id number] Date of Birth: 1970-05-06  Subjective/Objective: Patient is from home with parents, ind with ADL's. Plans to return home at discharge. She has a PCP and insurance, no issues reported.                    Action/Plan: Anticipate DC home with self care. No CM needs.    Expected Discharge Date:                  Expected Discharge Plan:  Home/Self Care  In-House Referral:  NA  Discharge planning Services  CM Consult  Post Acute Care Choice:  NA Choice offered to:  NA  DME Arranged:    DME Agency:     HH Arranged:    HH Agency:     Status of Service:  In process, will continue to follow  If discussed at Long Length of Stay Meetings, dates discussed:    Additional Comments:  Durelle Zepeda, Chauncey Reading, RN 10/01/2015, 11:06 AM

## 2015-10-02 DIAGNOSIS — K7460 Unspecified cirrhosis of liver: Secondary | ICD-10-CM

## 2015-10-02 DIAGNOSIS — K5900 Constipation, unspecified: Secondary | ICD-10-CM

## 2015-10-02 DIAGNOSIS — K5909 Other constipation: Secondary | ICD-10-CM

## 2015-10-02 DIAGNOSIS — K567 Ileus, unspecified: Secondary | ICD-10-CM | POA: Diagnosis not present

## 2015-10-02 DIAGNOSIS — R1084 Generalized abdominal pain: Secondary | ICD-10-CM | POA: Diagnosis not present

## 2015-10-02 LAB — GLUCOSE, CAPILLARY
GLUCOSE-CAPILLARY: 121 mg/dL — AB (ref 65–99)
GLUCOSE-CAPILLARY: 104 mg/dL — AB (ref 65–99)
Glucose-Capillary: 108 mg/dL — ABNORMAL HIGH (ref 65–99)
Glucose-Capillary: 134 mg/dL — ABNORMAL HIGH (ref 65–99)

## 2015-10-02 LAB — HCV RNA QUANT RFLX ULTRA OR GENOTYP
HCV RNA QNT(LOG COPY/ML): UNDETERMINED {Log_IU}/mL
HepC Qn: NOT DETECTED IU/mL

## 2015-10-02 MED ORDER — NICOTINE 21 MG/24HR TD PT24: 21.0000 mg | MEDICATED_PATCH | Freq: Every day | TRANSDERMAL | 0 refills | Status: DC

## 2015-10-02 MED ORDER — LUBIPROSTONE 24 MCG PO CAPS
24.0000 ug | ORAL_CAPSULE | Freq: Two times a day (BID) | ORAL | Status: DC
  Administered 2015-10-02 – 2015-10-03 (×2): 24 ug via ORAL
  Filled 2015-10-02 (×2): qty 1

## 2015-10-02 MED ORDER — PANTOPRAZOLE SODIUM 40 MG PO TBEC: 40.0000 mg | DELAYED_RELEASE_TABLET | Freq: Two times a day (BID) | ORAL | 12 refills | Status: DC

## 2015-10-02 MED ORDER — POLYETHYLENE GLYCOL 3350 17 G PO PACK: 17.0000 g | PACK | Freq: Every day | ORAL | 0 refills | Status: DC | PRN

## 2015-10-02 MED ORDER — BISACODYL 10 MG RE SUPP
10.0000 mg | Freq: Every day | RECTAL | Status: DC | PRN
  Administered 2015-10-02: 10 mg via RECTAL
  Filled 2015-10-02: qty 1

## 2015-10-02 NOTE — Progress Notes (Signed)
Subjective: She feels better. She still having some abdominal pain. Her abdominal x-ray showed substantial amount of stool. She's not had a bowel movement despite taking MiraLAX. I think this is probably opioid-induced constipation.  Objective: Vital signs in last 24 hours: Temp:  [98 F (36.7 C)-98.6 F (37 C)] 98.6 F (37 C) (09/06 0545) Pulse Rate:  [88-109] 93 (09/06 0545) Resp:  [18-20] 18 (09/06 0545) BP: (116-149)/(73-93) 149/93 (09/06 0545) SpO2:  [94 %-97 %] 96 % (09/06 0545) Weight:  [85.2 kg (187 lb 12.8 oz)] 85.2 kg (187 lb 12.8 oz) (09/06 0545) Weight change: -2.268 kg (-5 lb) Last BM Date: 09/30/15  Intake/Output from previous day: 09/05 0701 - 09/06 0700 In: 3752.5 [P.O.:240; I.V.:3512.5] Out: -   PHYSICAL EXAM General appearance: alert, cooperative and mildly obese Resp: clear to auscultation bilaterally Cardio: regular rate and rhythm, S1, S2 normal, no murmur, click, rub or gallop GI: She has mild diffuse tenderness but no rebound bowel sounds are present and active Extremities: extremities normal, atraumatic, no cyanosis or edema  Lab Results:  Results for orders placed or performed during the hospital encounter of 09/29/15 (from the past 48 hour(s))  Hepatitis c antibody (reflex)     Status: Abnormal   Collection Time: 09/30/15 10:33 AM  Result Value Ref Range   HCV Ab >11.0 (H) 0.0 - 0.9 s/co ratio    Comment: (NOTE) Performed At: [facility] [address] [place], [place] [id number] Lindon Romp MD [id number]   Hepatitis A antibody, total     Status: Abnormal   Collection Time: 09/30/15 10:33 AM  Result Value Ref Range   Hep A Total Ab Positive (A) Negative    Comment: (NOTE) Performed At: [facility] [place], [place] [id number] Lindon Romp MD [id number]   Hepatitis B surface antibody     Status: None   Collection Time: 09/30/15 10:33 AM  Result Value Ref Range   Hep B S Ab Non Reactive    Comment: (NOTE)              Non Reactive: Inconsistent with immunity,                            less than 10 mIU/mL              Reactive:     Consistent with immunity,                            greater than 9.9 mIU/mL Performed At: [facility] [place], [place] [id number] Lindon Romp MD [id number]   Comment2 - Hep panel     Status: None   Collection Time: 09/30/15 10:33 AM  Result Value Ref Range   COMMENT HCV-2 Comment     Comment: (NOTE) Strong reactive antibody screen (s/c ratio >10.9) is consistent with past or present HCV infection.  Follow-up testing by HCV, Quantitative, Real time PCR (#[id number]) is recommended to determine viral load/diagnosis of current HCV infection. Performed At: [facility] [place], [place] [id number] Lindon Romp MD [id number]   Glucose, capillary     Status: Abnormal   Collection Time: 09/30/15 11:46 AM  Result Value Ref Range   Glucose-Capillary 112 (H) 65 - 99 mg/dL   Comment 1 Notify RN   Glucose, capillary     Status: Abnormal   Collection Time:  09/30/15  4:46 PM  Result Value Ref Range   Glucose-Capillary 107 (H) 65 - 99 mg/dL  Glucose, capillary     Status: Abnormal   Collection Time: 09/30/15  9:05 PM  Result Value Ref Range   Glucose-Capillary 111 (H) 65 - 99 mg/dL   Comment 1 Notify RN   Glucose, capillary     Status: Abnormal   Collection Time: 10/01/15  8:30 AM  Result Value Ref Range   Glucose-Capillary 105 (H) 65 - 99 mg/dL   Comment 1 Notify RN    Comment 2 Document in Chart   Comprehensive metabolic panel     Status: Abnormal   Collection Time: 10/01/15 10:26 AM  Result Value Ref Range   Sodium 139 135 - 145 mmol/L   Potassium 3.8 3.5 - 5.1 mmol/L   Chloride 106 101 - 111 mmol/L   CO2 24 22 - 32 mmol/L   Glucose, Bld 128 (H) 65 - 99 mg/dL   BUN 10 6 - 20 mg/dL   Creatinine, Ser 0.93 0.44 - 1.00 mg/dL   Calcium 8.8 (L) 8.9 - 10.3 mg/dL   Total Protein 7.4  6.5 - 8.1 g/dL   Albumin 3.3 (L) 3.5 - 5.0 g/dL   AST 17 15 - 41 U/L   ALT 18 14 - 54 U/L   Alkaline Phosphatase 95 38 - 126 U/L   Total Bilirubin 0.8 0.3 - 1.2 mg/dL   GFR calc non Af Amer >60 >60 mL/min   GFR calc Af Amer >60 >60 mL/min    Comment: (NOTE) The eGFR has been calculated using the CKD EPI equation. This calculation has not been validated in all clinical situations. eGFR's persistently <60 mL/min signify possible Chronic Kidney Disease.    Anion gap 9 5 - 15  Protime-INR     Status: None   Collection Time: 10/01/15 10:26 AM  Result Value Ref Range   Prothrombin Time 13.1 11.4 - 15.2 seconds   INR 0.99   Glucose, capillary     Status: Abnormal   Collection Time: 10/01/15 12:00 PM  Result Value Ref Range   Glucose-Capillary 138 (H) 65 - 99 mg/dL   Comment 1 Notify RN    Comment 2 Document in Chart   Glucose, capillary     Status: Abnormal   Collection Time: 10/01/15  4:11 PM  Result Value Ref Range   Glucose-Capillary 140 (H) 65 - 99 mg/dL   Comment 1 Notify RN    Comment 2 Document in Chart   Glucose, capillary     Status: Abnormal   Collection Time: 10/01/15  9:59 PM  Result Value Ref Range   Glucose-Capillary 150 (H) 65 - 99 mg/dL   Comment 1 Notify RN   Glucose, capillary     Status: Abnormal   Collection Time: 10/02/15  7:57 AM  Result Value Ref Range   Glucose-Capillary 121 (H) 65 - 99 mg/dL    ABGS No results for input(s): PHART, PO2ART, TCO2, HCO3 in the last 72 hours.  Invalid input(s): PCO2 CULTURES Recent Results (from the past 240 hour(s))  MRSA PCR Screening     Status: None   Collection Time: 09/29/15  9:45 PM  Result Value Ref Range Status   MRSA by PCR NEGATIVE NEGATIVE Final    Comment:        The GeneXpert MRSA Assay (FDA approved for NASAL specimens only), is one component of a comprehensive MRSA colonization surveillance program. It is not intended to diagnose MRSA  infection nor to guide or monitor treatment for MRSA  infections.    Studies/Results: Dg Abd 1 View  Result Date: 10/01/2015 CLINICAL DATA:  Ileus, lower abdominal pain 6 days EXAM: ABDOMEN - 1 VIEW COMPARISON:  10/26/2015 CT FINDINGS: Gas within mildly prominent colon. Moderate stool. No obstruction or free air. No organomegaly or suspicious calcification. Rightward scoliosis in the lumbar spine with degenerative changes. IMPRESSION: Moderate stool burden. Mild gaseous distention of the colon without evidence of obstruction. Electronically Signed   By: Rolm Baptise M.D.   On: 10/01/2015 15:19    Medications:  Prior to Admission:  Prescriptions Prior to Admission  Medication Sig Dispense Refill Last Dose  . INVOKAMET [phone] MG TABS Take 1 tablet by mouth 2 (two) times daily.   09/29/2015 at Unknown time  . Ketamine HCl POWD Apply 1 application topically daily as needed (pain in back).    unknown  . omeprazole (PRILOSEC) 20 MG capsule Take 20 mg by mouth daily as needed (heartburn).   Past Week at Unknown time  . oxyCODONE (ROXICODONE) 15 MG immediate release tablet Take 1 tablet by mouth 3 (three) times daily.    09/29/2015 at Unknown time  . pantoprazole (PROTONIX) 40 MG tablet Take 40 mg by mouth daily.    09/29/2015 at Unknown time  . pravastatin (PRAVACHOL) 20 MG tablet Take 20 mg by mouth daily.    09/29/2015 at Unknown time  . SPIRIVA HANDIHALER 18 MCG inhalation capsule Place 18 mcg into inhaler and inhale daily as needed (sob).    unknown   Scheduled: . enoxaparin (LOVENOX) injection  40 mg Subcutaneous Q24H  . insulin aspart  0-9 Units Subcutaneous TID WC  . nicotine  21 mg Transdermal Daily  . pantoprazole  40 mg Oral BID AC  . potassium chloride  40 mEq Oral BID  . pravastatin  20 mg Oral q1800  . tiotropium  18 mcg Inhalation Q2000   Continuous: . sodium chloride 150 mL/hr at 10/02/15 0148   QHU:[id number] **OR** acetaminophen, ALPRAZolam, bisacodyl, morphine injection, ondansetron **OR** ondansetron (ZOFRAN) IV, oxyCODONE,  polyethylene glycol  Assesment: I ordered a suppository. She can take more MiraLAX. If she has a good bowel movement and I will let her go home later today  She has continued abdominal pain but it may be related to her constipation  She has chronic back and neck pain from disc disease  She has not had any further diarrhea so I discontinued enteric precautions and the C. difficile test  She is on chronic opiates at home.  She has new problems discovered this hospitalization including cirrhosis of the liver and hepatitis C Principal Problem:   Abdominal pain Active Problems:   DM (diabetes mellitus), type 2 ([place])   TOBACCO USER   Backache   Opiate dependence ([place])   Ileus ([facility])   Diarrhea   Cirrhosis of liver ([place])- by imaging CT scan    Plan: Probable discharge later today. She will have GI follow-up as outlined above    LOS: 0 days   Jamie Bell L 10/02/2015, 8:28 AM

## 2015-10-02 NOTE — Progress Notes (Signed)
Subjective:  Patient feels some better today. Less abdominal pain. Patient reports typically has 1-2 hard painful BMs per week chronically. No BM in 2 days.   Objective: Vital signs in last 24 hours: Temp:  [98 F (36.7 C)-98.6 F (37 C)] 98.6 F (37 C) (09/06 0545) Pulse Rate:  [88-109] 93 (09/06 0545) Resp:  [18-20] 18 (09/06 0545) BP: (116-149)/(73-93) 149/93 (09/06 0545) SpO2:  [94 %-97 %] 96 % (09/06 0545) Weight:  [187 lb 12.8 oz (85.2 kg)] 187 lb 12.8 oz (85.2 kg) (09/06 0545) Last BM Date: 09/30/15 General:   Alert,  Well-developed, well-nourished, pleasant and cooperative in NAD Head:  Normocephalic and atraumatic. Eyes:  Sclera clear, no icterus.  Abdomen:  Soft, mild diffuse tenderness and nondistended.   Normal bowel sounds, without guarding, and without rebound.   Extremities:  Without clubbing, deformity or edema. Neurologic:  Alert and  oriented x4;  grossly normal neurologically. Skin:  Intact without significant lesions or rashes. Psych:  Alert and cooperative. Normal mood and affect.  Intake/Output from previous day: 09/05 0701 - 09/06 0700 In: 3752.5 [P.O.:240; I.V.:3512.5] Out: -  Intake/Output this shift: No intake/output data recorded.  Lab Results: CBC  Recent Labs  09/29/15 1437 09/30/15 0604  WBC 9.8 8.0  HGB 14.3 13.1  HCT 41.8 38.6  MCV 93.3 92.8  PLT 85* 76*   BMET  Recent Labs  09/29/15 1437 09/30/15 0604 10/01/15 1026  NA 135 139 139  K 3.6 3.4* 3.8  CL 102 109 106  CO2 21* 26 24  GLUCOSE 282* 115* 128*  BUN 13 10 10   CREATININE 1.04* 0.84 0.93  CALCIUM 9.0 8.4* 8.8*   LFTs  Recent Labs  09/29/15 1437 09/30/15 0604 10/01/15 1026  BILITOT 0.8 0.7 0.8  ALKPHOS 104 85 95  AST 44* 20 17  ALT 33 22 18  PROT 7.4 6.5 7.4  ALBUMIN 3.7 3.0* 3.3*    Recent Labs  09/29/15 1437  LIPASE 23   PT/INR  Recent Labs  10/01/15 1026  LABPROT 13.1  INR 0.99      Imaging Studies: Dg Abd 1 View  Result Date:  10/01/2015 CLINICAL DATA:  Ileus, lower abdominal pain 6 days EXAM: ABDOMEN - 1 VIEW COMPARISON:  10/26/2015 CT FINDINGS: Gas within mildly prominent colon. Moderate stool. No obstruction or free air. No organomegaly or suspicious calcification. Rightward scoliosis in the lumbar spine with degenerative changes. IMPRESSION: Moderate stool burden. Mild gaseous distention of the colon without evidence of obstruction. Electronically Signed   By: Rolm Baptise M.D.   On: 10/01/2015 15:19   Ct Abdomen Pelvis W Contrast  Result Date: 09/29/2015 CLINICAL DATA:  Lower abdominal pain for 2 days radiating to epigastric region. Diarrhea. EXAM: CT ABDOMEN AND PELVIS WITH CONTRAST TECHNIQUE: Multidetector CT imaging of the abdomen and pelvis was performed using the standard protocol following bolus administration of intravenous contrast. CONTRAST:  132m ISOVUE-300 IOPAMIDOL (ISOVUE-300) INJECTION 61% COMPARISON:  11/18/2010 FINDINGS: Lower chest:  No acute findings. Hepatobiliary: Nodular capsular contour of liver seen, consistent with hepatic cirrhosis. No liver masses identified. Prior cholecystectomy noted. No evidence of biliary dilatation. Pancreas: No mass, inflammatory changes, or other significant abnormality. Spleen: Moderate splenomegaly, with spleen measuring approximately 16 cm in length compared to 11 cm on previous study. No splenic lesions identified. Adrenals/Urinary Tract: No masses identified. No evidence of hydronephrosis. Stomach/Bowel: Mild diffuse small bowel dilatation and wall thickening seen without evidence of transition point. This is suspicious for mild ileus and hypoalbuminemia  in the setting of cirrhosis. No focal inflammatory process or abscess identified. Vascular/Lymphatic: No pathologically enlarged lymph nodes. No evidence of abdominal aortic aneurysm. Aortic atherosclerosis. Reproductive: No mass or other significant abnormality. Other: None. Musculoskeletal:  No suspicious bone lesions  identified. IMPRESSION: Findings consistent with hepatic cirrhosis. No evidence of hepatic neoplasm. Moderate splenomegaly, consistent with portal venous hypertension. No evidence of ascites. Mild diffuse small bowel wall thickening and dilatation, without transition point. This is suspicious for mild ileus and hypoalbuminemia in the setting of cirrhosis. Aortic atherosclerosis. Electronically Signed   By: Earle Gell M.D.   On: 09/29/2015 16:41  [2 weeks]   Assessment: 45 year old female admitted with abdominal pain, diarrhea, and new finding of cirrhosis. Clinically, diarrhea has resolved. Query self-limiting gastroenteritis vs spurious diarrhea in light of abd film results yesterday.    1. Abdominal pain: persists but improved. Abd film with moderate stool burden yesterday. No obstruction seen. She is tolerating her diet, no nausea or vomiting, +flatus. Counseled on avoidance of BC powders. Stop Pepcid IV and start Protonix 40 mg BID.   2. Diarrhea: ?spurious diarrhea. Agree with stool studies if diarrhea recurs. Appears this has resolved. Low-volume hematochezia likely benign but will need outpatient colonoscopy for further evaluation.   3. Cirrhosis: in setting of chronic Hepatitis C. Unable to exclude other factors to include fatty liver, +/- ETOH use. MELD 6.  Will need screening EGD as outpatient and have routine ultrasounds every 6 months. Immune to Hep A. Will need Hep B vaccination as outpatient.  4. Hepatitis C: will order genotype with viral load. Will refer to Roosevelt Locks, NP in [place] for treatment as outpatient. No IV drug use in 10-15 years.    Plan: 1. As above. 2. Will start Amitiza 35mg BID for opoid-induced constipation.  3. F/U pending HCV labs.  4. Close interval follow up for EGD/TCS.  LLaureen Ochs LBernarda CaffeyRHey[facility] [phone] AM     LOS: 0 days

## 2015-10-03 DIAGNOSIS — R1084 Generalized abdominal pain: Secondary | ICD-10-CM | POA: Diagnosis not present

## 2015-10-03 DIAGNOSIS — G8929 Other chronic pain: Secondary | ICD-10-CM | POA: Diagnosis not present

## 2015-10-03 DIAGNOSIS — K7469 Other cirrhosis of liver: Secondary | ICD-10-CM | POA: Diagnosis not present

## 2015-10-03 DIAGNOSIS — K567 Ileus, unspecified: Secondary | ICD-10-CM | POA: Diagnosis not present

## 2015-10-03 LAB — GLUCOSE, CAPILLARY: GLUCOSE-CAPILLARY: 118 mg/dL — AB (ref 65–99)

## 2015-10-03 MED ORDER — ALPRAZOLAM 0.25 MG PO TABS: 0.2500 mg | ORAL_TABLET | Freq: Three times a day (TID) | ORAL | 0 refills | Status: DC | PRN

## 2015-10-03 MED ORDER — LUBIPROSTONE 24 MCG PO CAPS: 24.0000 ug | ORAL_CAPSULE | Freq: Two times a day (BID) | ORAL | 12 refills | Status: DC

## 2015-10-03 NOTE — Progress Notes (Signed)
She feels better. She is still having some abdominal discomfort. Her bowels have moved and she feels like she needs to move her bowels again. She is ready for discharge

## 2015-10-03 NOTE — Discharge Summary (Signed)
Physician Discharge Summary  Patient ID: Jamie Bell MRN: [id number] DOB/AGE: 44-Mar-1972 44 y.o. Primary Care Physician:Thresea Doble L, MD Admit date: 09/29/2015 Discharge date: 10/03/2015    Discharge Diagnoses:   Principal Problem:   Generalized abdominal pain Active Problems:   DM (diabetes mellitus), type 2 ([place])   TOBACCO USER   Backache   Opiate dependence ([place])   Ileus ([facility])   Diarrhea   Cirrhosis of liver ([place])- by imaging CT scan   Constipation Opioid-induced constipation Hepatitis C   Medication List    STOP taking these medications   Ketamine HCl Powd   omeprazole 20 MG capsule Commonly known as:  PRILOSEC     TAKE these medications   ALPRAZolam 0.25 MG tablet Commonly known as:  XANAX Take 1 tablet (0.25 mg total) by mouth 3 (three) times daily as needed for anxiety.   INVOKAMET [phone] MG Tabs Generic drug:  Canagliflozin-Metformin HCl Take 1 tablet by mouth 2 (two) times daily.   lubiprostone 24 MCG capsule Commonly known as:  AMITIZA Take 1 capsule (24 mcg total) by mouth 2 (two) times daily with a meal.   nicotine 21 mg/24hr patch Commonly known as:  NICODERM CQ - dosed in mg/24 hours Place 1 patch (21 mg total) onto the skin daily.   oxyCODONE 15 MG immediate release tablet Commonly known as:  ROXICODONE Take 1 tablet by mouth 3 (three) times daily.   pantoprazole 40 MG tablet Commonly known as:  PROTONIX Take 1 tablet (40 mg total) by mouth 2 (two) times daily. What changed:  when to take this   polyethylene glycol packet Commonly known as:  MIRALAX / GLYCOLAX Take 17 g by mouth daily as needed for moderate constipation.   pravastatin 20 MG tablet Commonly known as:  PRAVACHOL Take 20 mg by mouth daily.   SPIRIVA HANDIHALER 18 MCG inhalation capsule Generic drug:  tiotropium Place 18 mcg into inhaler and inhale daily as needed (sob).       Discharged Condition:Improved    Consults: GI  Significant Diagnostic  Studies: Dg Abd 1 View  Result Date: 10/01/2015 CLINICAL DATA:  Ileus, lower abdominal pain 6 days EXAM: ABDOMEN - 1 VIEW COMPARISON:  10/26/2015 CT FINDINGS: Gas within mildly prominent colon. Moderate stool. No obstruction or free air. No organomegaly or suspicious calcification. Rightward scoliosis in the lumbar spine with degenerative changes. IMPRESSION: Moderate stool burden. Mild gaseous distention of the colon without evidence of obstruction. Electronically Signed   By: Rolm Baptise M.D.   On: 10/01/2015 15:19   Ct Abdomen Pelvis W Contrast  Result Date: 09/29/2015 CLINICAL DATA:  Lower abdominal pain for 2 days radiating to epigastric region. Diarrhea. EXAM: CT ABDOMEN AND PELVIS WITH CONTRAST TECHNIQUE: Multidetector CT imaging of the abdomen and pelvis was performed using the standard protocol following bolus administration of intravenous contrast. CONTRAST:  172m ISOVUE-300 IOPAMIDOL (ISOVUE-300) INJECTION 61% COMPARISON:  11/18/2010 FINDINGS: Lower chest:  No acute findings. Hepatobiliary: Nodular capsular contour of liver seen, consistent with hepatic cirrhosis. No liver masses identified. Prior cholecystectomy noted. No evidence of biliary dilatation. Pancreas: No mass, inflammatory changes, or other significant abnormality. Spleen: Moderate splenomegaly, with spleen measuring approximately 16 cm in length compared to 11 cm on previous study. No splenic lesions identified. Adrenals/Urinary Tract: No masses identified. No evidence of hydronephrosis. Stomach/Bowel: Mild diffuse small bowel dilatation and wall thickening seen without evidence of transition point. This is suspicious for mild ileus and hypoalbuminemia in the setting of cirrhosis. No focal inflammatory process  or abscess identified. Vascular/Lymphatic: No pathologically enlarged lymph nodes. No evidence of abdominal aortic aneurysm. Aortic atherosclerosis. Reproductive: No mass or other significant abnormality. Other: None.  Musculoskeletal:  No suspicious bone lesions identified. IMPRESSION: Findings consistent with hepatic cirrhosis. No evidence of hepatic neoplasm. Moderate splenomegaly, consistent with portal venous hypertension. No evidence of ascites. Mild diffuse small bowel wall thickening and dilatation, without transition point. This is suspicious for mild ileus and hypoalbuminemia in the setting of cirrhosis. Aortic atherosclerosis. Electronically Signed   By: Earle Gell M.D.   On: 09/29/2015 16:41    Lab Results: Basic Metabolic Panel:  Recent Labs  10/01/15 1026  NA 139  K 3.8  CL 106  CO2 24  GLUCOSE 128*  BUN 10  CREATININE 0.93  CALCIUM 8.8*   Liver Function Tests:  Recent Labs  10/01/15 1026  AST 17  ALT 18  ALKPHOS 95  BILITOT 0.8  PROT 7.4  ALBUMIN 3.3*     CBC: No results for input(s): WBC, NEUTROABS, HGB, HCT, MCV, PLT in the last 72 hours.  Recent Results (from the past 240 hour(s))  MRSA PCR Screening     Status: None   Collection Time: 09/29/15  9:45 PM  Result Value Ref Range Status   MRSA by PCR NEGATIVE NEGATIVE Final    Comment:        The GeneXpert MRSA Assay (FDA approved for NASAL specimens only), is one component of a comprehensive MRSA colonization surveillance program. It is not intended to diagnose MRSA infection nor to guide or monitor treatment for MRSA infections.      Hospital Course: This is a 45 year old who came to the emergency department because of abdominal pain. She had what looked like an ileus. She was treated with medications but didn't improve very much. She had what looked like cirrhosis on CT. She had viral titers checked and is positive for hepatitis C. GI consultation was obtained and they're going to plan to refer her for treatment for hepatitis C but also to treat her for cirrhosis. She improved over the next several days. She had continued abdominal pain and some problem with constipation which is probably opioid induced. Her  constipation improved her abdominal pain improved and she was discharged home  Discharge Exam: Blood pressure 130/89, pulse 79, temperature 98.2 F (36.8 C), temperature source Oral, resp. rate 20, height 5' 3.5" (1.613 m), weight 76.5 kg (168 lb 11.2 oz), last menstrual period 09/27/2015, SpO2 99 %. She is awake and alert. Minimal if any tenderness.  Disposition: Home      Signed: Terie Lear L   10/03/2015, 9:05 AM

## 2015-10-28 ENCOUNTER — Ambulatory Visit: Payer: Self-pay | Admitting: Nurse Practitioner

## 2015-10-28 DIAGNOSIS — J449 Chronic obstructive pulmonary disease, unspecified: Secondary | ICD-10-CM | POA: Diagnosis not present

## 2015-10-28 DIAGNOSIS — J45998 Other asthma: Secondary | ICD-10-CM | POA: Diagnosis not present

## 2015-10-31 ENCOUNTER — Encounter: Payer: Self-pay | Admitting: Gastroenterology

## 2015-10-31 ENCOUNTER — Other Ambulatory Visit: Payer: Self-pay

## 2015-10-31 ENCOUNTER — Ambulatory Visit (INDEPENDENT_AMBULATORY_CARE_PROVIDER_SITE_OTHER): Payer: Medicare Other | Admitting: Gastroenterology

## 2015-10-31 VITALS — BP 134/77 | HR 75 | Temp 98.1°F | Ht 65.0 in | Wt 173.0 lb

## 2015-10-31 DIAGNOSIS — K5900 Constipation, unspecified: Secondary | ICD-10-CM

## 2015-10-31 DIAGNOSIS — K7460 Unspecified cirrhosis of liver: Secondary | ICD-10-CM

## 2015-10-31 DIAGNOSIS — K625 Hemorrhage of anus and rectum: Secondary | ICD-10-CM

## 2015-10-31 DIAGNOSIS — J0100 Acute maxillary sinusitis, unspecified: Secondary | ICD-10-CM

## 2015-10-31 MED ORDER — DOXYCYCLINE HYCLATE 100 MG PO TABS: 100.0000 mg | ORAL_TABLET | Freq: Two times a day (BID) | ORAL | 0 refills | Status: DC

## 2015-10-31 MED ORDER — PEG 3350-KCL-NA BICARB-NACL 420 G PO SOLR: 4000.0000 mL | ORAL | 0 refills | Status: DC

## 2015-10-31 NOTE — Progress Notes (Signed)
Can we get labs when the patient has her preop labs? Iron/TIBC, ferritin tissue transglutaminase, IgA, serum IgA level.

## 2015-10-31 NOTE — Patient Instructions (Addendum)
1. Colonoscopy and upper endoscopy as scheduled. Please see separate instructions. 2. Hepatitis B vaccination series, total of 3 shots. 3. Doxycycline 100 mg twice daily with food for 7 days for sinusitis. 4. Avoid all alcohol. 5. Increase in Amitiza 24 g twice daily with food for constipation. If you continue to have issues with constipation, please call me and we will make further adjustments in your medication. 6. Return to the office in 3 months for follow-up. At that time we would consider sending you to [facility] in [place].  7. Continue pantoprazole 40 mg twice daily.

## 2015-10-31 NOTE — Progress Notes (Addendum)
REVIEWED-MELD-Na SCORE 7. NO NEED FOR LIVER TRANSPLANT EVALUATION.  Primary Care Physician:  Alonza Bogus, MD  Primary Gastroenterologist:  Barney Drain, MD   Chief Complaint  Patient presents with  . Follow-up    HPI:  Jamie Bell is a 45 y.o. female here For hospital follow-up. She was seen back in early part of September when she presented to the hospital with abdominal pain, diarrhea. CT at that time showed cirrhosis, moderate splenomegaly, mild diffuse small bowel wall thickening and dilation without transition point. She was noted to have a hepatitis C antibody positive. She had a positive antibody back in 2013 the patient states she was unaware. She's had some small volume bright red blood per rectum. Routinely consumes BC powders. No prior colonoscopy. EGD in 2007 with mild antral erythema.  History of IV drug use and 15 years ago. No prior blood transfusions or tattoos. Patient denies alcohol abuse but reports social alcohol use. Currently drinking 1 beer every 6 months or so. Father has cirrhosis but she believes it was due to fatty liver but he also consumed some alcohol. 2 paternal aunts with cirrhosis as well. No family history of colon cancer.  She had an HCV RNA quantitative test during hospitalization, HCV not detected. She had a nonreactive hepatitis B surface antibody. Positive hepatitis A antibody. LFTs normal except for albumin of 3.3. INR normal. Creatinine normal. MELD 6.   Patient states she is fatigued. She continues to have issues with constipation. Only taken an Amitiza once daily, didn't realize she should take it twice daily. Having a bowel movement 2 times per week. Denies blood in the stool or melena. No abdominal pain. Intentional weight loss over this past several months of 35 pounds.(Weight is actually up possibly 5 pounds in September but overall significant weight loss). Cut back on portion sizes. States typically loses weight in the summertime. No  heartburn, dysphagia.  Patient complains of 7-10 day history of sinus pressure, nasal discharge, nocturnal coughing.  Current Outpatient Prescriptions  Medication Sig Dispense Refill  . ALPRAZolam (XANAX) 0.25 MG tablet Take 1 tablet (0.25 mg total) by mouth 3 (three) times daily as needed for anxiety. 30 tablet 0  . INVOKAMET [phone] MG TABS Take 1 tablet by mouth 2 (two) times daily.    Marland Kitchen lubiprostone (AMITIZA) 24 MCG capsule Take 1 capsule (24 mcg total) by mouth 2 (two) times daily with a meal. 60 capsule 12  . nicotine (NICODERM CQ - DOSED IN MG/24 HOURS) 21 mg/24hr patch Place 1 patch (21 mg total) onto the skin daily. 28 patch 0  . oxyCODONE (ROXICODONE) 15 MG immediate release tablet Take 1 tablet by mouth 3 (three) times daily.     . pantoprazole (PROTONIX) 40 MG tablet Take 1 tablet (40 mg total) by mouth 2 (two) times daily. 60 tablet 12  . polyethylene glycol (MIRALAX / GLYCOLAX) packet Take 17 g by mouth daily as needed for moderate constipation. 14 each 0  . pravastatin (PRAVACHOL) 20 MG tablet Take 20 mg by mouth daily.     Marland Kitchen SPIRIVA HANDIHALER 18 MCG inhalation capsule Place 18 mcg into inhaler and inhale daily as needed (sob).      No current facility-administered medications for this visit.     Allergies as of 10/31/2015 - Review Complete 10/31/2015  Allergen Reaction Noted  . Ciprofloxacin Swelling 05/20/2005  . Nsaids Hives 05/02/2012    Past Medical History:  Diagnosis Date  . Chronic back pain   . DDD (  degenerative disc disease)   . Decreased hearing on the right.   F/u by ENT in the past and no further management.  . Depression   . Diabetes mellitus   . GERD (gastroesophageal reflux disease)   . Hypertension    took med. for HTN, several yrs. ago, no longer needs , seen by Dr. Lattie Haw last yr. during a hosp. at [facility], for MRSA bacteremia   . MRSA bacteremia 2014   Tx with Vancomycin  . PCOS (polycystic ovarian syndrome)   . Polysubstance abuse   .  Sciatica    HNP- Cerv. 7- T2    Past Surgical History:  Procedure Laterality Date  . BACK SURGERY    . BREAST SURGERY Left    blocked milk ducts   . CARPAL TUNNEL RELEASE    . CATARACT EXTRACTION W/PHACO Left 02/04/2015   Procedure: CATARACT EXTRACTION PHACO AND INTRAOCULAR LENS PLACEMENT (IOC);  Surgeon: [name], MD;  Location: [facility] ORS;  Service: Ophthalmology;  Laterality: Left;  CDE: 4.11  . CHOLECYSTECTOMY    . ESOPHAGOGASTRODUODENOSCOPY  2007   Dr. Oneida Alar: mild antral erythema. Future procedures need to be done with Propofol per notes.   Marland Kitchen NECK SURGERY    . PERIPHERALLY INSERTED CENTRAL CATHETER INSERTION Right 2007   for treatment with Vancomycin   . POSTERIOR CERVICAL LAMINECTOMY N/A 11/22/2013   Procedure: POSTERIOR CERVICAL LAMINECTOMY C7/T1, T/1-2;  Surgeon: [name], MD;  Location: [facility];  Service: Neurosurgery;  Laterality: N/A;  POSTERIOR CERVICAL LAMINECTOMY C7/T1, T/1-2  . TEE WITHOUT CARDIOVERSION N/A 08/09/2012   Procedure: TRANSESOPHAGEAL ECHOCARDIOGRAM (TEE);  Surgeon: [name], MD;  Location: [facility];  Service: Cardiovascular;  Laterality: N/A;    Family History  Problem Relation Age of Onset  . Hypertension Mother   . Hypertension Father   . Cirrhosis Father     Drank some alcohol  . Diabetes Brother   . Diabetes Other   . Cirrhosis Paternal Aunt     No alcohol  . Cirrhosis Paternal Aunt     No alcohol  . Colon cancer Neg Hx     Social History   Social History  . Marital status: Divorced    Spouse name: N/A  . Number of children: N/A  . Years of education: N/A   Occupational History  . Not on file.   Social History Main Topics  . Smoking status: Current Every Day Smoker    Packs/day: 1.50    Years: 20.00    Types: Cigarettes  . Smokeless tobacco: Never Used     Comment: pain pills  . Alcohol use Yes     Comment: rare beer once every 6 months   . Drug use: No     Comment: prior history of cocaine use, none in 15  years   . Sexual activity: Yes    Birth control/ protection: None, Abstinence   Other Topics Concern  . Not on file   Social History Narrative  . No narrative on file      ROS:  General: Negative for anorexia,Unintentional weight loss, fever, chills weakness.Positive fatigue Eyes: Negative for vision changes.  ENT: Negative for hoarseness, difficulty swallowing , nasal congestion. CV: Negative for chest pain, angina, palpitations, dyspnea on exertion, peripheral edema.  Respiratory: Negative for dyspnea at rest, dyspnea on exertion, cough, sputum, wheezing.  GI: See history of present illness. GU:  Negative for dysuria, hematuria, urinary incontinence, urinary frequency, nocturnal urination.  MS: Negative for joint  pain, low back pain.  Derm: Negative for rash or itching.  Neuro: Negative for weakness, abnormal sensation, seizure, frequent headaches, memory loss, confusion.  Psych: Negative for anxiety, depression, suicidal ideation, hallucinations.  Endo: Negative for unusual weight change. See history of present illness Heme: Negative for bruising or bleeding. Allergy: Negative for rash or hives.    Physical Examination:  BP 134/77   Pulse 75   Temp 98.1 F (36.7 C) (Oral)   Ht 5' 5"  (1.651 m)   Wt 173 lb (78.5 kg)   LMP 09/27/2015   BMI 28.79 kg/m    General: Well-nourished, well-developed in no acute distress. Accompanied by mother. Patient has female pattern baldness and facial hair Head: Normocephalic, atraumatic.   Eyes: Conjunctiva pink, no icterus. Mouth: Oropharyngeal mucosa moist and pink , no lesions erythema or exudate. Neck: Supple without thyromegaly, masses, or lymphadenopathy.  Lungs: Clear to auscultation bilaterally.  Heart: Regular rate and rhythm, no murmurs rubs or gallops.  Abdomen: Bowel sounds are normal, nontender, nondistended, no hepatosplenomegaly or masses, no abdominal bruits or    hernia , no rebound or guarding.   Rectal: Not  performed Extremities: No lower extremity edema. No clubbing or deformities.  Neuro: Alert and oriented x 4 , grossly normal neurologically.  Skin: Warm and dry, no rash or jaundice.   Psych: Alert and cooperative, normal mood and affect.  Labs: Lab Results  Component Value Date   WBC 8.0 09/30/2015   HGB 13.1 09/30/2015   HCT 38.6 09/30/2015   MCV 92.8 09/30/2015   PLT 76 (L) 09/30/2015   Lab Results  Component Value Date   CREATININE 0.93 10/01/2015   BUN 10 10/01/2015   NA 139 10/01/2015   K 3.8 10/01/2015   CL 106 10/01/2015   CO2 24 10/01/2015   Lab Results  Component Value Date   ALT 18 10/01/2015   AST 17 10/01/2015   ALKPHOS 95 10/01/2015   BILITOT 0.8 10/01/2015   Lab Results  Component Value Date   INR 0.99 10/01/2015     Imaging Studies: Dg Abd 1 View  Result Date: 10/01/2015 CLINICAL DATA:  Ileus, lower abdominal pain 6 days EXAM: ABDOMEN - 1 VIEW COMPARISON:  10/26/2015 CT FINDINGS: Gas within mildly prominent colon. Moderate stool. No obstruction or free air. No organomegaly or suspicious calcification. Rightward scoliosis in the lumbar spine with degenerative changes. IMPRESSION: Moderate stool burden. Mild gaseous distention of the colon without evidence of obstruction. Electronically Signed   By: Rolm Baptise M.D.   On: 10/01/2015 15:19

## 2015-10-31 NOTE — Assessment & Plan Note (Signed)
45 year old female with CT evidence of cirrhosis, moderate splenomegaly, thrombocytopenia. She has a history of positive hepatitis C antibody but recent HCV RNA was negative. Jamie Bell cirrhosis is high on the differential. She had early cirrhosis take that on CT 3-4 years ago the patient was unaware. At this time she needs hepatitis B vaccination. Avoid all alcohol. Begin hepatoma surveillance, due for abdominal ultrasound in 6 months. Esophageal variceal screening via EGD in the near future. Plan on deep sedation in the OR given polypharmacy.  I have discussed the risks, alternatives, benefits with regards to but not limited to the risk of reaction to medication, bleeding, infection, perforation and the patient is agreeable to proceed. Written consent to be obtained.  Would consider checking HCV RNA level once more with next blood draw in 6 months to make sure that she did not have recent undetectable but present hepatitis C virus. Plan on ultrasound and routine labs in 6 months. At this point given LFTs, I have not ordered serologies for autoimmune hepatitis, PBC. We should consider possibility of hemochromatosis and even celiac disease as an etiology of cirrhosis. We'll see if they can draw these at time of preop. Return to the office in 3 months. Consider referral to [facility] liver care for initial liver transplant evaluation just to make sure there are no obstacles for future potential need a liver transplant.

## 2015-10-31 NOTE — Assessment & Plan Note (Signed)
Doxycycline 100 mg twice a day for 7 days.

## 2015-10-31 NOTE — Assessment & Plan Note (Signed)
Intermittent rectal bleeding. Recommend colonoscopy for further evaluation. Plan with deep sedation in the OR.  I have discussed the risks, alternatives, benefits with regards to but not limited to the risk of reaction to medication, bleeding, infection, perforation and the patient is agreeable to proceed. Written consent to be obtained.

## 2015-10-31 NOTE — Assessment & Plan Note (Signed)
Increase Amitiza to 24 g twice a day. Call with ongoing problems.

## 2015-11-01 ENCOUNTER — Telehealth: Payer: Self-pay

## 2015-11-01 NOTE — Telephone Encounter (Signed)
Attempted to call pt's mobile to inform of pre-op appt 11/05/15 at 1:45pm. Unable to leave message. Called home phone number on chart and it was her mom.

## 2015-11-01 NOTE — Progress Notes (Signed)
I called Kim at Endo scheduling and she told me to fax the lab orders to her @ [phone].  She put a note in also that Neil Crouch, PA has added labs to be done at Pre-op. Orders faxed to [facility].

## 2015-11-01 NOTE — Telephone Encounter (Signed)
Called pt and informed of pre-op appt 11/05/15 at 1:45pm. Also informed pt that LSL added blood work and it would be done at pre-op.

## 2015-11-01 NOTE — Progress Notes (Signed)
Tried to call pt. She is not at the home number listed. Called mobile and VM not set up.

## 2015-11-01 NOTE — Progress Notes (Signed)
cc'ed to pcp °

## 2015-11-04 NOTE — Patient Instructions (Signed)
Jamie Bell  11/04/2015     @PREFPERIOPPHARMACY @   Your procedure is scheduled on  11/12/15  Report to [facility] at  845  A.M.  Call this number if you have problems the morning of surgery:  [phone]   Remember:  Do not eat food or drink liquids after midnight.  Take these medicines the morning of surgery with A SIP OF WATER  Xanax (if needed), oxycodone (if needd), protonix. Use your spiriva before you come.   Do not wear jewelry, make-up or nail polish.  Do not wear lotions, powders, or perfumes, or deoderant.  Do not shave 48 hours prior to surgery.  Men may shave face and neck.  Do not bring valuables to the hospital.  [facility] is not responsible for any belongings or valuables.  Contacts, dentures or bridgework may not be worn into surgery.  Leave your suitcase in the car.  After surgery it may be brought to your room.  For patients admitted to the hospital, discharge time will be determined by your treatment team.  Patients discharged the day of surgery will not be allowed to drive home.   Name and phone number of your driver:   family Special instructions:  Follow the diet and prep instructions given to you by Dr Nona Dell office.  Please read over the following fact sheets that you were given. Anesthesia Post-op Instructions and Care and Recovery After Surgery      Esophagogastroduodenoscopy Esophagogastroduodenoscopy (EGD) is a procedure that is used to examine the lining of the esophagus, stomach, and first part of the small intestine (duodenum). A long, flexible, lighted tube with a camera attached (endoscope) is inserted down the throat to view these organs. This procedure is done to detect problems or abnormalities, such as inflammation, bleeding, ulcers, or growths, in order to treat them. The procedure lasts 5-20 minutes. It is usually an outpatient procedure, but it may need to be performed in a hospital in emergency  cases. LET [facility] CARE PROVIDER KNOW ABOUT:  Any allergies you have.  All medicines you are taking, including vitamins, herbs, eye drops, creams, and over-the-counter medicines.  Previous problems you or members of your family have had with the use of anesthetics.  Any blood disorders you have.  Previous surgeries you have had.  Medical conditions you have. RISKS AND COMPLICATIONS Generally, this is a safe procedure. However, problems can occur and include:  Infection.  Bleeding.  Tearing (perforation) of the esophagus, stomach, or duodenum.  Difficulty breathing or not being able to breathe.  Excessive sweating.  Spasms of the larynx.  Slowed heartbeat.  Low blood pressure. BEFORE THE PROCEDURE  Do not eat or drink anything after midnight on the night before the procedure or as directed by your health care provider.  Do not take your regular medicines before the procedure if your health care provider asks you not to. Ask your health care provider about changing or stopping those medicines.  If you wear dentures, be prepared to remove them before the procedure.  Arrange for someone to drive you home after the procedure. PROCEDURE  A numbing medicine (local anesthetic) may be sprayed in your throat for comfort and to stop you from gagging or coughing.  You will have an IV tube inserted in a vein in your hand or arm. You will receive medicines and fluids through this tube.  You will be given a  medicine to relax you (sedative).  A pain reliever will be given through the IV tube.  A mouth guard may be placed in your mouth to protect your teeth and to keep you from biting on the endoscope.  You will be asked to lie on your left side.  The endoscope will be inserted down your throat and into your esophagus, stomach, and duodenum.  Air will be put through the endoscope to allow your health care provider to clearly view the lining of your esophagus.  The lining  of your esophagus, stomach, and duodenum will be examined. During the exam, your health care provider may:  Remove tissue to be examined under a microscope (biopsy) for inflammation, infection, or other medical problems.  Remove growths.  Remove objects (foreign bodies) that are stuck.  Treat any bleeding with medicines or other devices that stop tissues from bleeding (hot cautery, clipping devices).  Widen (dilate) or stretch narrowed areas of your esophagus and stomach.  The endoscope will be withdrawn. AFTER THE PROCEDURE  You will be taken to a recovery area for observation. Your blood pressure, heart rate, breathing rate, and blood oxygen level will be monitored often until the medicines you were given have worn off.  Do not eat or drink anything until the numbing medicine has worn off and your gag reflex has returned. You may choke.  Your health care provider should be able to discuss his or her findings with you. It will take longer to discuss the test results if any biopsies were taken.   This information is not intended to replace advice given to you by your health care provider. Make sure you discuss any questions you have with your health care provider.   Document Released: 05/15/2004 Document Revised: 02/02/2014 Document Reviewed: 12/16/2011 Elsevier Interactive Patient Education 2016 [place]. Esophagogastroduodenoscopy, Care After Refer to this sheet in the next few weeks. These instructions provide you with information about caring for yourself after your procedure. Your health care provider may also give you more specific instructions. Your treatment has been planned according to current medical practices, but problems sometimes occur. Call your health care provider if you have any problems or questions after your procedure. WHAT TO EXPECT AFTER THE PROCEDURE After your procedure, it is typical to feel:  Soreness in your throat.  Pain with swallowing.  Sick to  your stomach (nauseous).  Bloated.  Dizzy.  Fatigued. HOME CARE INSTRUCTIONS  Do not eat or drink anything until the numbing medicine (local anesthetic) has worn off and your gag reflex has returned. You will know that the local anesthetic has worn off when you can swallow comfortably.  Do not drive or operate machinery until directed by your health care provider.  Take medicines only as directed by your health care provider. SEEK MEDICAL CARE IF:   You cannot stop coughing.  You are not urinating at all or less than usual. SEEK IMMEDIATE MEDICAL CARE IF:  You have difficulty swallowing.  You cannot eat or drink.  You have worsening throat or chest pain.  You have dizziness or lightheadedness or you faint.  You have nausea or vomiting.  You have chills.  You have a fever.  You have severe abdominal pain.  You have black, tarry, or bloody stools.   This information is not intended to replace advice given to you by your health care provider. Make sure you discuss any questions you have with your health care provider.   Document Released: 12/30/2011 Document Revised:  02/02/2014 Document Reviewed: 12/30/2011 Elsevier Interactive Patient Education [employer].  Colonoscopy A colonoscopy is an exam to look at the entire large intestine (colon). This exam can help find problems such as tumors, polyps, inflammation, and areas of bleeding. The exam takes about 1 hour.  LET [facility] CARE PROVIDER KNOW ABOUT:   Any allergies you have.  All medicines you are taking, including vitamins, herbs, eye drops, creams, and over-the-counter medicines.  Previous problems you or members of your family have had with the use of anesthetics.  Any blood disorders you have.  Previous surgeries you have had.  Medical conditions you have. RISKS AND COMPLICATIONS  Generally, this is a safe procedure. However, as with any procedure, complications can occur. Possible  complications include:  Bleeding.  Tearing or rupture of the colon wall.  Reaction to medicines given during the exam.  Infection (rare). BEFORE THE PROCEDURE   Ask your health care provider about changing or stopping your regular medicines.  You may be prescribed an oral bowel prep. This involves drinking a large amount of medicated liquid, starting the day before your procedure. The liquid will cause you to have multiple loose stools until your stool is almost clear or light green. This cleans out your colon in preparation for the procedure.  Do not eat or drink anything else once you have started the bowel prep, unless your health care provider tells you it is safe to do so.  Arrange for someone to drive you home after the procedure. PROCEDURE   You will be given medicine to help you relax (sedative).  You will lie on your side with your knees bent.  A long, flexible tube with a light and camera on the end (colonoscope) will be inserted through the rectum and into the colon. The camera sends video back to a computer screen as it moves through the colon. The colonoscope also releases carbon dioxide gas to inflate the colon. This helps your health care provider see the area better.  During the exam, your health care provider may take a small tissue sample (biopsy) to be examined under a microscope if any abnormalities are found.  The exam is finished when the entire colon has been viewed. AFTER THE PROCEDURE   Do not drive for 24 hours after the exam.  You may have a small amount of blood in your stool.  You may pass moderate amounts of gas and have mild abdominal cramping or bloating. This is caused by the gas used to inflate your colon during the exam.  Ask when your test results will be ready and how you will get your results. Make sure you get your test results.   This information is not intended to replace advice given to you by your health care provider. Make sure you  discuss any questions you have with your health care provider.   Document Released: 01/10/2000 Document Revised: 11/02/2012 Document Reviewed: 09/19/2012 Elsevier Interactive Patient Education 2016 Elsevier Inc. Colonoscopy, Care After Refer to this sheet in the next few weeks. These instructions provide you with information on caring for yourself after your procedure. Your health care provider may also give you more specific instructions. Your treatment has been planned according to current medical practices, but problems sometimes occur. Call your health care provider if you have any problems or questions after your procedure. WHAT TO EXPECT AFTER THE PROCEDURE  After your procedure, it is typical to have the following:  A small amount of blood in  your stool.  Moderate amounts of gas and mild abdominal cramping or bloating. HOME CARE INSTRUCTIONS  Do not drive, operate machinery, or sign important documents for 24 hours.  You may shower and resume your regular physical activities, but move at a slower pace for the first 24 hours.  Take frequent rest periods for the first 24 hours.  Walk around or put a warm pack on your abdomen to help reduce abdominal cramping and bloating.  Drink enough fluids to keep your urine clear or pale yellow.  You may resume your normal diet as instructed by your health care provider. Avoid heavy or fried foods that are hard to digest.  Avoid drinking alcohol for 24 hours or as instructed by your health care provider.  Only take over-the-counter or prescription medicines as directed by your health care provider.  If a tissue sample (biopsy) was taken during your procedure:  Do not take aspirin or blood thinners for 7 days, or as instructed by your health care provider.  Do not drink alcohol for 7 days, or as instructed by your health care provider.  Eat soft foods for the first 24 hours. SEEK MEDICAL CARE IF: You have persistent spotting of blood in  your stool 2-3 days after the procedure. SEEK IMMEDIATE MEDICAL CARE IF:  You have more than a small spotting of blood in your stool.  You pass large blood clots in your stool.  Your abdomen is swollen (distended).  You have nausea or vomiting.  You have a fever.  You have increasing abdominal pain that is not relieved with medicine.   This information is not intended to replace advice given to you by your health care provider. Make sure you discuss any questions you have with your health care provider.   Document Released: 08/27/2003 Document Revised: 11/02/2012 Document Reviewed: 09/19/2012 Elsevier Interactive Patient Education 2016 [place] Monitored anesthesia care is an anesthesia service for a medical procedure. Anesthesia is the loss of the ability to feel pain. It is produced by medicines called anesthetics. It may affect a small area of your body (local anesthesia), a large area of your body (regional anesthesia), or your entire body (general anesthesia). The need for monitored anesthesia care depends your procedure, your condition, and the potential need for regional or general anesthesia. It is often provided during procedures where:   General anesthesia may be needed if there are complications. This is because you need special care when you are under general anesthesia.   You will be under local or regional anesthesia. This is so that you are able to have higher levels of anesthesia if needed.   You will receive calming medicines (sedatives). This is especially the case if sedatives are given to put you in a semi-conscious state of relaxation (deep sedation). This is because the amount of sedative needed to produce this state can be hard to predict. Too much of a sedative can produce general anesthesia. Monitored anesthesia care is performed by one or more health care providers who have special training in all types of anesthesia. You will  need to meet with these health care providers before your procedure. During this meeting, they will ask you about your medical history. They will also give you instructions to follow. (For example, you will need to stop eating and drinking before your procedure. You may also need to stop or change medicines you are taking.) During your procedure, your health care providers will stay with you. They will:  Watch your condition. This includes watching your blood pressure, breathing, and level of pain.   Diagnose and treat problems that occur.   Give medicines if they are needed. These may include calming medicines (sedatives) and anesthetics.   Make sure you are comfortable.  Having monitored anesthesia care does not necessarily mean that you will be under anesthesia. It does mean that your health care providers will be able to manage anesthesia if you need it or if it occurs. It also means that you will be able to have a different type of anesthesia than you are having if you need it. When your procedure is complete, your health care providers will continue to watch your condition. They will make sure any medicines wear off before you are allowed to go home.    This information is not intended to replace advice given to you by your health care provider. Make sure you discuss any questions you have with your health care provider.   Document Released: 10/08/2004 Document Revised: 02/02/2014 Document Reviewed: 02/24/2012 Elsevier Interactive Patient Education 2016 Elsevier Inc. PATIENT INSTRUCTIONS POST-ANESTHESIA  IMMEDIATELY FOLLOWING SURGERY:  Do not drive or operate machinery for the first twenty four hours after surgery.  Do not make any important decisions for twenty four hours after surgery or while taking narcotic pain medications or sedatives.  If you develop intractable nausea and vomiting or a severe headache please notify your doctor immediately.  FOLLOW-UP:  Please make an  appointment with your surgeon as instructed. You do not need to follow up with anesthesia unless specifically instructed to do so.  WOUND CARE INSTRUCTIONS (if applicable):  Keep a dry clean dressing on the anesthesia/puncture wound site if there is drainage.  Once the wound has quit draining you may leave it open to air.  Generally you should leave the bandage intact for twenty four hours unless there is drainage.  If the epidural site drains for more than 36-48 hours please call the anesthesia department.  QUESTIONS?:  Please feel free to call your physician or the hospital operator if you have any questions, and they will be happy to assist you.

## 2015-11-04 NOTE — Pre-Procedure Instructions (Signed)
Spoke with Neil Crouch, PA after reading her note about possible esophageal banding and made sure it was alright to add possible banding to the consent. Magda Paganini states that is what consent should read. Fixed consent to match booking per [place].

## 2015-11-07 ENCOUNTER — Encounter (HOSPITAL_COMMUNITY)
Admission: RE | Admit: 2015-11-07 | Discharge: 2015-11-07 | Disposition: A | Discharge status: Home / Self Care | Payer: Medicare Other | Source: Ambulatory Visit | Attending: Gastroenterology | Admitting: Gastroenterology

## 2015-11-07 ENCOUNTER — Encounter (HOSPITAL_COMMUNITY): Payer: Self-pay

## 2015-11-07 DIAGNOSIS — K625 Hemorrhage of anus and rectum: Secondary | ICD-10-CM | POA: Diagnosis not present

## 2015-11-07 DIAGNOSIS — Z1389 Encounter for screening for other disorder: Secondary | ICD-10-CM | POA: Insufficient documentation

## 2015-11-07 DIAGNOSIS — Z01812 Encounter for preprocedural laboratory examination: Secondary | ICD-10-CM | POA: Diagnosis not present

## 2015-11-07 LAB — CBC WITH DIFFERENTIAL/PLATELET
BASOS ABS: 0.1 10*3/uL (ref 0.0–0.1)
Basophils Relative: 1 %
EOS ABS: 0.1 10*3/uL (ref 0.0–0.7)
EOS PCT: 1 %
HCT: 45.7 % (ref 36.0–46.0)
Hemoglobin: 15.6 g/dL — ABNORMAL HIGH (ref 12.0–15.0)
LYMPHS ABS: 1.9 10*3/uL (ref 0.7–4.0)
Lymphocytes Relative: 16 %
MCH: 32.2 pg (ref 26.0–34.0)
MCHC: 34.1 g/dL (ref 30.0–36.0)
MCV: 94.4 fL (ref 78.0–100.0)
Monocytes Absolute: 0.6 10*3/uL (ref 0.1–1.0)
Monocytes Relative: 5 %
Neutro Abs: 9.2 10*3/uL — ABNORMAL HIGH (ref 1.7–7.7)
Neutrophils Relative %: 77 %
PLATELETS: 235 10*3/uL (ref 150–400)
RBC: 4.84 MIL/uL (ref 3.87–5.11)
RDW: 14.3 % (ref 11.5–15.5)
WBC: 11.8 10*3/uL — AB (ref 4.0–10.5)

## 2015-11-07 LAB — BASIC METABOLIC PANEL
Anion gap: 9 (ref 5–15)
BUN: 19 mg/dL (ref 6–20)
CO2: 25 mmol/L (ref 22–32)
CREATININE: 1.07 mg/dL — AB (ref 0.44–1.00)
Calcium: 10.1 mg/dL (ref 8.9–10.3)
Chloride: 101 mmol/L (ref 101–111)
GFR calc Af Amer: >60 mL/min (ref >60)
Glucose, Bld: 300 mg/dL — ABNORMAL HIGH (ref 65–99)
Potassium: 4.1 mmol/L (ref 3.5–5.1)
SODIUM: 135 mmol/L (ref 135–145)

## 2015-11-07 LAB — IRON AND TIBC
Iron: 66 ug/dL (ref 28–170)
SATURATION RATIOS: 16 % (ref 10.4–31.8)
TIBC: 406 ug/dL (ref 250–450)
UIBC: 340 ug/dL

## 2015-11-07 LAB — FERRITIN: FERRITIN: 55 ng/mL (ref 11–307)

## 2015-11-07 LAB — SURGICAL PCR SCREEN
MRSA, PCR: NEGATIVE
Staphylococcus aureus: NEGATIVE

## 2015-11-07 LAB — HCG, SERUM, QUALITATIVE: PREG SERUM: NEGATIVE

## 2015-11-12 ENCOUNTER — Encounter (HOSPITAL_COMMUNITY): Payer: Self-pay | Admitting: Anesthesiology

## 2015-11-12 ENCOUNTER — Ambulatory Visit (HOSPITAL_COMMUNITY)
Admission: RE | Admit: 2015-11-12 | Discharge: 2015-11-12 | Disposition: A | Discharge status: Home / Self Care | Payer: Medicare Other | Source: Ambulatory Visit | Attending: Gastroenterology | Admitting: Gastroenterology

## 2015-11-12 ENCOUNTER — Encounter (HOSPITAL_COMMUNITY)
Admission: RE | Disposition: A | Discharge status: Home / Self Care | Payer: Self-pay | Source: Ambulatory Visit | Attending: Gastroenterology

## 2015-11-12 ENCOUNTER — Ambulatory Visit (HOSPITAL_COMMUNITY): Payer: Medicare Other | Admitting: Anesthesiology

## 2015-11-12 DIAGNOSIS — K449 Diaphragmatic hernia without obstruction or gangrene: Secondary | ICD-10-CM | POA: Diagnosis not present

## 2015-11-12 DIAGNOSIS — K5150 Left sided colitis without complications: Secondary | ICD-10-CM | POA: Insufficient documentation

## 2015-11-12 DIAGNOSIS — F17210 Nicotine dependence, cigarettes, uncomplicated: Secondary | ICD-10-CM | POA: Diagnosis not present

## 2015-11-12 DIAGNOSIS — K921 Melena: Secondary | ICD-10-CM | POA: Insufficient documentation

## 2015-11-12 DIAGNOSIS — K2980 Duodenitis without bleeding: Secondary | ICD-10-CM | POA: Insufficient documentation

## 2015-11-12 DIAGNOSIS — Z13810 Encounter for screening for upper gastrointestinal disorder: Secondary | ICD-10-CM | POA: Insufficient documentation

## 2015-11-12 DIAGNOSIS — K7460 Unspecified cirrhosis of liver: Secondary | ICD-10-CM | POA: Diagnosis not present

## 2015-11-12 DIAGNOSIS — K3189 Other diseases of stomach and duodenum: Secondary | ICD-10-CM | POA: Insufficient documentation

## 2015-11-12 DIAGNOSIS — F418 Other specified anxiety disorders: Secondary | ICD-10-CM | POA: Diagnosis not present

## 2015-11-12 DIAGNOSIS — E119 Type 2 diabetes mellitus without complications: Secondary | ICD-10-CM | POA: Diagnosis not present

## 2015-11-12 DIAGNOSIS — K219 Gastro-esophageal reflux disease without esophagitis: Secondary | ICD-10-CM | POA: Diagnosis not present

## 2015-11-12 DIAGNOSIS — D123 Benign neoplasm of transverse colon: Secondary | ICD-10-CM | POA: Insufficient documentation

## 2015-11-12 DIAGNOSIS — I10 Essential (primary) hypertension: Secondary | ICD-10-CM | POA: Insufficient documentation

## 2015-11-12 DIAGNOSIS — K625 Hemorrhage of anus and rectum: Secondary | ICD-10-CM

## 2015-11-12 DIAGNOSIS — K2970 Gastritis, unspecified, without bleeding: Secondary | ICD-10-CM | POA: Diagnosis not present

## 2015-11-12 DIAGNOSIS — K6289 Other specified diseases of anus and rectum: Secondary | ICD-10-CM | POA: Diagnosis not present

## 2015-11-12 DIAGNOSIS — K529 Noninfective gastroenteritis and colitis, unspecified: Secondary | ICD-10-CM

## 2015-11-12 DIAGNOSIS — Z79899 Other long term (current) drug therapy: Secondary | ICD-10-CM | POA: Diagnosis not present

## 2015-11-12 DIAGNOSIS — Z79891 Long term (current) use of opiate analgesic: Secondary | ICD-10-CM | POA: Diagnosis not present

## 2015-11-12 DIAGNOSIS — K648 Other hemorrhoids: Secondary | ICD-10-CM | POA: Insufficient documentation

## 2015-11-12 HISTORY — PX: ESOPHAGOGASTRODUODENOSCOPY (EGD) WITH PROPOFOL: SHX5813

## 2015-11-12 HISTORY — PX: ESOPHAGEAL BANDING: SHX5518

## 2015-11-12 HISTORY — PX: POLYPECTOMY: SHX5525

## 2015-11-12 HISTORY — PX: COLONOSCOPY WITH PROPOFOL: SHX5780

## 2015-11-12 LAB — TRANSFERRIN: Transferrin: 267 mg/dL (ref 192–382)

## 2015-11-12 LAB — GLUCOSE, CAPILLARY
Glucose-Capillary: 97 mg/dL (ref 65–99)
Glucose-Capillary: 101 mg/dL — ABNORMAL HIGH (ref 65–99)

## 2015-11-12 LAB — FERRITIN: FERRITIN: 45 ng/mL (ref 11–307)

## 2015-11-12 SURGERY — COLONOSCOPY WITH PROPOFOL
Anesthesia: Monitor Anesthesia Care

## 2015-11-12 MED ORDER — MIDAZOLAM HCL 2 MG/2ML IJ SOLN
INTRAMUSCULAR | Status: AC
  Filled 2015-11-12: qty 2

## 2015-11-12 MED ORDER — OXYCODONE HCL 5 MG PO TABS
ORAL_TABLET | ORAL | Status: AC
  Filled 2015-11-12: qty 1

## 2015-11-12 MED ORDER — OXYCODONE HCL 5 MG PO TABS
5.0000 mg | ORAL_TABLET | Freq: Once | ORAL | Status: AC | PRN
  Administered 2015-11-12: 5 mg via ORAL

## 2015-11-12 MED ORDER — LIDOCAINE VISCOUS 2 % MT SOLN
OROMUCOSAL | Status: AC
  Filled 2015-11-12: qty 15

## 2015-11-12 MED ORDER — CHLORHEXIDINE GLUCONATE CLOTH 2 % EX PADS: 6.0000 | MEDICATED_PAD | Freq: Once | CUTANEOUS | Status: DC

## 2015-11-12 MED ORDER — PROPOFOL 500 MG/50ML IV EMUL
INTRAVENOUS | Status: DC | PRN
  Administered 2015-11-12: 100 ug/kg/min via INTRAVENOUS

## 2015-11-12 MED ORDER — PROPOFOL 10 MG/ML IV BOLUS
INTRAVENOUS | Status: AC
  Filled 2015-11-12: qty 40

## 2015-11-12 MED ORDER — PROPOFOL 10 MG/ML IV BOLUS
INTRAVENOUS | Status: AC
  Filled 2015-11-12: qty 20

## 2015-11-12 MED ORDER — ONDANSETRON HCL 4 MG/2ML IJ SOLN: 4.0000 mg | Freq: Four times a day (QID) | INTRAMUSCULAR | Status: DC | PRN

## 2015-11-12 MED ORDER — FENTANYL CITRATE (PF) 100 MCG/2ML IJ SOLN
50.0000 ug | INTRAMUSCULAR | Status: AC | PRN
  Administered 2015-11-12 (×2): 50 ug via INTRAVENOUS

## 2015-11-12 MED ORDER — LACTATED RINGERS IV SOLN
INTRAVENOUS | Status: DC
  Administered 2015-11-12: 1000 mL via INTRAVENOUS

## 2015-11-12 MED ORDER — OXYCODONE HCL 5 MG/5ML PO SOLN
5.0000 mg | Freq: Once | ORAL | Status: AC | PRN
  Filled 2015-11-12: qty 5

## 2015-11-12 MED ORDER — LIDOCAINE VISCOUS 2 % MT SOLN
6.0000 mL | Freq: Once | OROMUCOSAL | Status: AC
  Administered 2015-11-12: 6 mL via OROMUCOSAL

## 2015-11-12 MED ORDER — MIDAZOLAM HCL 5 MG/5ML IJ SOLN
INTRAMUSCULAR | Status: DC | PRN
  Administered 2015-11-12: 2 mg via INTRAVENOUS

## 2015-11-12 MED ORDER — FENTANYL CITRATE (PF) 100 MCG/2ML IJ SOLN
INTRAMUSCULAR | Status: AC
  Filled 2015-11-12: qty 2

## 2015-11-12 MED ORDER — FENTANYL CITRATE (PF) 100 MCG/2ML IJ SOLN: 25.0000 ug | INTRAMUSCULAR | Status: DC | PRN

## 2015-11-12 NOTE — H&P (Signed)
Primary Care Physician:  Alonza Bogus, MD Primary Gastroenterologist:  Dr. Oneida Alar  Pre-Procedure History & Physical: HPI:  Jamie Bell is a 45 y.o. female here for SCREENING FOR VARICES/brbpr.  Past Medical History:  Diagnosis Date  . Chronic back pain   . DDD (degenerative disc disease)   . Decreased hearing on the right.   F/u by ENT in the past and no further management.  . Depression   . Diabetes mellitus   . GERD (gastroesophageal reflux disease)   . Hypertension    took med. for HTN, several yrs. ago, no longer needs , seen by Dr. Lattie Haw last yr. during a hosp. at [facility], for MRSA bacteremia   . MRSA bacteremia 2014   Tx with Vancomycin  . PCOS (polycystic ovarian syndrome)   . Polysubstance abuse   . Sciatica    HNP- Cerv. 7- T2    Past Surgical History:  Procedure Laterality Date  . BACK SURGERY    . BREAST SURGERY Left    blocked milk ducts   . CARPAL TUNNEL RELEASE    . CATARACT EXTRACTION W/PHACO Left 02/04/2015   Procedure: CATARACT EXTRACTION PHACO AND INTRAOCULAR LENS PLACEMENT (IOC);  Surgeon: [name], MD;  Location: [facility] ORS;  Service: Ophthalmology;  Laterality: Left;  CDE: 4.11  . CHOLECYSTECTOMY    . ESOPHAGOGASTRODUODENOSCOPY  2007   Dr. Oneida Alar: mild antral erythema. Future procedures need to be done with Propofol per notes.   Marland Kitchen NECK SURGERY    . PERIPHERALLY INSERTED CENTRAL CATHETER INSERTION Right 2007   for treatment with Vancomycin   . POSTERIOR CERVICAL LAMINECTOMY N/A 11/22/2013   Procedure: POSTERIOR CERVICAL LAMINECTOMY C7/T1, T/1-2;  Surgeon: [name], MD;  Location: [place] NEURO ORS;  Service: Neurosurgery;  Laterality: N/A;  POSTERIOR CERVICAL LAMINECTOMY C7/T1, T/1-2  . TEE WITHOUT CARDIOVERSION N/A 08/09/2012   Procedure: TRANSESOPHAGEAL ECHOCARDIOGRAM (TEE);  Surgeon: [name], MD;  Location: [facility] ENDO SUITE;  Service: Cardiovascular;  Laterality: N/A;    Prior to Admission medications   Medication Sig Start Date End  Date Taking? Authorizing Provider  ALPRAZolam (XANAX) 0.25 MG tablet Take 1 tablet (0.25 mg total) by mouth 3 (three) times daily as needed for anxiety. 10/03/15  Yes Sinda Du, MD  INVOKAMET [phone] MG TABS Take 1 tablet by mouth 2 (two) times daily. 09/03/15  Yes Historical Provider, MD  lubiprostone (AMITIZA) 24 MCG capsule Take 1 capsule (24 mcg total) by mouth 2 (two) times daily with a meal. 10/03/15  Yes Sinda Du, MD  nicotine (NICODERM CQ - DOSED IN MG/24 HOURS) 21 mg/24hr patch Place 1 patch (21 mg total) onto the skin daily. 10/02/15  Yes Sinda Du, MD  oxyCODONE (ROXICODONE) 15 MG immediate release tablet Take 1 tablet by mouth 3 (three) times daily.  09/03/15  Yes Historical Provider, MD  pantoprazole (PROTONIX) 40 MG tablet Take 1 tablet (40 mg total) by mouth 2 (two) times daily. Patient taking differently: Take 40 mg by mouth daily.  10/02/15  Yes Sinda Du, MD  polyethylene glycol [facility] / GLYCOLAX) packet Take 17 g by mouth daily as needed for moderate constipation. 10/02/15  Yes Sinda Du, MD  pravastatin (PRAVACHOL) 20 MG tablet Take 20 mg by mouth daily.  12/31/14  Yes Historical Provider, MD  SPIRIVA HANDIHALER 18 MCG inhalation capsule Place 18 mcg into inhaler and inhale daily as needed (sob).  12/31/14  Yes Historical Provider, MD  doxycycline (VIBRA-TABS) 100 MG tablet Take 1 tablet (100 mg total)  by mouth 2 (two) times daily. With food Patient not taking: Reported on 11/06/2015 10/31/15   Mahala Menghini, PA-C  polyethylene glycol-electrolytes (TRILYTE) 420 g solution Take 4,000 mLs by mouth as directed. 10/31/15   [name], MD    Allergies as of 10/31/2015 - Review Complete 10/31/2015  Allergen Reaction Noted  . Ciprofloxacin Swelling 05/20/2005  . Nsaids Hives 05/02/2012    Family History  Problem Relation Age of Onset  . Hypertension Mother   . Hypertension Father   . Cirrhosis Father     Drank some alcohol  . Diabetes Brother   . Diabetes  Other   . Cirrhosis Paternal Aunt     No alcohol  . Cirrhosis Paternal Aunt     No alcohol  . Colon cancer Neg Hx     Social History   Social History  . Marital status: Divorced    Spouse name: N/A  . Number of children: N/A  . Years of education: N/A   Occupational History  . Not on file.   Social History Main Topics  . Smoking status: Current Every Day Smoker    Packs/day: 1.50    Years: 20.00    Types: Cigarettes  . Smokeless tobacco: Never Used     Comment: pain pills  . Alcohol use Yes     Comment: rare beer once every 6 months   . Drug use: No     Comment: prior history of cocaine use, none in 15 years   . Sexual activity: Yes    Birth control/ protection: None, Abstinence   Other Topics Concern  . Not on file   Social History Narrative  . No narrative on file    Review of Systems: See HPI, otherwise negative ROS   Physical Exam: BP (P) 128/88   Pulse (P) 86   Temp 97.8 F (36.6 C)   Resp (P) 18   LMP 10/28/2015   SpO2 100%  General:   Alert,  pleasant and cooperative in NAD Head:  Normocephalic and atraumatic. Neck:  Supple; Lungs:  Clear throughout to auscultation.    Heart:  Regular rate and rhythm. Abdomen:  Soft, nontender and nondistended. Normal bowel sounds, without guarding, and without rebound.   Neurologic:  Alert and  oriented x4;  grossly normal neurologically.  Impression/Plan:     SCREENING FOR VARICES/brbpr  PLAN:  1.EGD/TCS TODAY

## 2015-11-12 NOTE — OR Nursing (Signed)
Patient states she has a "very loose tooth"  She would like for [place] to pull it we let her know we could not pull the tooth but it was necessary to have a bite block in for procedure. Dr. Oneida Alar notified and bite block placed. Patient aware and procedure began without incidence.

## 2015-11-12 NOTE — Discharge Instructions (Signed)
You had 2 polyps removed. Your rectal bleeding is most likely due to internal hemorrhoids and mild left sided colitis likely due to [facility] powders. You have gastritis due to [facility] powders. & a small HIATAL HERNIA. I biopsied your stomach, & colon  DRINK WATER TO KEEP YOUR URINE LIGHT YELLOW.  FOLLOW A HIGH FIBER/LOW FAT DIET. AVOID ITEMS THAT CAUSE BLOATING. SEE INFO BELOW.  AVOID ITEMS THAT TRIGGER GASTRITIS. SEE INFO BELOW.  CONTINUE PROTONIX. TAKE 30 MINUTES PRIOR TO MEALS TWICE DAILY. PROTONIX TREATS GASTRITIS AND REFLUX. IT PREVENTS ULCERS.  YOUR BIOPSY RESULTS WILL BE AVAILABLE IN MY CHART OCT 20 AND MY OFFICE WILL CONTACT YOU IN 10-14 DAYS WITH YOUR RESULTS. YOUR  LAB RESULTS WILL BE AVAILABLE IN MY CHART AFTER OCT 24 AND MY OFFICE WILL CONTACT YOU IN 10-14 DAYS WITH YOUR RESULTS.   Next colonoscopy in 3-5 years.   ENDOSCOPY Care After Read the instructions outlined below and refer to this sheet in the next week. These discharge instructions provide you with general information on caring for yourself after you leave the hospital. While your treatment has been planned according to the most current medical practices available, unavoidable complications occasionally occur. If you have any problems or questions after discharge, call DR. Ammi Hutt, [phone].  ACTIVITY  You may resume your regular activity, but move at a slower pace for the next 24 hours.   Take frequent rest periods for the next 24 hours.   Walking will help get rid of the air and reduce the bloated feeling in your belly (abdomen).   No driving for 24 hours (because of the medicine (anesthesia) used during the test).   You may shower.   Do not sign any important legal documents or operate any machinery for 24 hours (because of the anesthesia used during the test).    NUTRITION  Drink plenty of fluids.   You may resume your normal diet as instructed by your doctor.   Begin with a light meal and progress to your  normal diet. Heavy or fried foods are harder to digest and may make you feel sick to your stomach (nauseated).   Avoid alcoholic beverages for 24 hours or as instructed.    MEDICATIONS  You may resume your normal medications.   WHAT YOU CAN EXPECT TODAY  Some feelings of bloating in the abdomen.   Passage of more gas than usual.   Spotting of blood in your stool or on the toilet paper  .  IF YOU HAD POLYPS REMOVED DURING THE ENDOSCOPY:  Eat a soft diet IF YOU HAVE NAUSEA, BLOATING, ABDOMINAL PAIN, OR VOMITING.    FINDING OUT THE RESULTS OF YOUR TEST Not all test results are available during your visit. DR. Oneida Alar WILL CALL YOU WITHIN 14 DAYS OF YOUR PROCEDUE WITH YOUR RESULTS. Do not assume everything is normal if you have not heard from DR. Danali Marinos, CALL HER OFFICE AT [phone].  SEEK IMMEDIATE MEDICAL ATTENTION AND CALL THE OFFICE: [phone] IF:  You have more than a spotting of blood in your stool.   Your belly is swollen (abdominal distention).   You are nauseated or vomiting.   You have a temperature over 101F.   You have abdominal pain or discomfort that is severe or gets worse throughout the day.   Gastritis  Gastritis is an inflammation (the body's way of reacting to injury and/or infection) of the stomach. It is often caused by viral or bacterial (germ) infections. It can also be caused  BY ASPIRIN, BC/GOODY POWDER'S, (IBUPROFEN) MOTRIN, OR ALEVE (NAPROXEN), chemicals (including alcohol), SPICY FOODS, and medications. This illness may be associated with generalized malaise (feeling tired, not well), UPPER ABDOMINAL STOMACH cramps, and fever. One common bacterial cause of gastritis is an organism known as H. Pylori. This can be treated with antibiotics.    Hiatal Hernia A hiatal hernia occurs when a part of the stomach slides above the diaphragm. The diaphragm is the thin muscle separating the belly (abdomen) from the chest. A hiatal hernia can be something  you are born with or develop over time. Hiatal hernias may allow stomach acid to flow back into your esophagus, the tube which carries food from your mouth to your stomach. If this acid causes problems it is called GERD (gastro-esophageal reflux disease).   SYMPTOMS Common symptoms of GERD are heartburn (burning in your chest). This is worse when lying down or bending over. It may also cause belching and indigestion. Some of the things which make GERD worse are:  Increased weight pushes on stomach making acid rise more easily.   Smoking markedly increases acid production.   Alcohol decreases lower esophageal sphincter pressure (valve between stomach and esophagus), allowing acid from stomach into esophagus.   Late evening meals and going to bed with a full stomach increases pressure.   Anything that causes an increase in acid production.    HOME CARE INSTRUCTIONS  Try to achieve and maintain an ideal body weight.   Avoid drinking alcoholic beverages.   DO NOT smokE.   Do not wear tight clothing around your chest or stomach.   Eat smaller meals and eat more frequently. This keeps your stomach from getting too full. Eat slowly.   Do not lie down for 2 or 3 hours after eating. Do not eat or drink anything 1 to 2 hours before going to bed.   Avoid caffeine beverages (colas, coffee, cocoa, tea), fatty foods, citrus fruits and all other foods and drinks that contain acid and that seem to increase the problems.   Avoid bending over, especially after eating OR STRAINING. Anything that increases the pressure in your belly increases the amount of acid that may be pushed up into your esophagus.    High-Fiber Diet A high-fiber diet changes your normal diet to include more whole grains, legumes, fruits, and vegetables. Changes in the diet involve replacing refined carbohydrates with unrefined foods. The calorie level of the diet is essentially unchanged. The Dietary Reference Intake  (recommended amount) for adult males is 38 grams per day. For adult females, it is 25 grams per day. Pregnant and lactating women should consume 28 grams of fiber per day. Fiber is the intact part of a plant that is not broken down during digestion. Functional fiber is fiber that has been isolated from the plant to provide a beneficial effect in the body. PURPOSE  Increase stool bulk.   Ease and regulate bowel movements.   Lower cholesterol.  INDICATIONS THAT YOU NEED MORE FIBER  Constipation and hemorrhoids.   Uncomplicated diverticulosis (intestine condition) and irritable bowel syndrome.   Weight management.   As a protective measure against hardening of the arteries (atherosclerosis), diabetes, and cancer.   GUIDELINES FOR INCREASING FIBER IN THE DIET  Start adding fiber to the diet slowly. A gradual increase of about 5 more grams (2 slices of whole-wheat bread, 2 servings of most fruits or vegetables, or 1 bowl of high-fiber cereal) per day is best. Too rapid an increase in fiber  may result in constipation, flatulence, and bloating.   Drink enough water and fluids to keep your urine clear or pale yellow. Water, juice, or caffeine-free drinks are recommended. Not drinking enough fluid may cause constipation.   Eat a variety of high-fiber foods rather than one type of fiber.   Try to increase your intake of fiber through using high-fiber foods rather than fiber pills or supplements that contain small amounts of fiber.   The goal is to change the types of food eaten. Do not supplement your present diet with high-fiber foods, but replace foods in your present diet.  INCLUDE A VARIETY OF FIBER SOURCES  Replace refined and processed grains with whole grains, canned fruits with fresh fruits, and incorporate other fiber sources. White rice, white breads, and most bakery goods contain little or no fiber.   Brown whole-grain rice, buckwheat oats, and many fruits and vegetables are all  good sources of fiber. These include: broccoli, Brussels sprouts, cabbage, cauliflower, beets, sweet potatoes, white potatoes (skin on), carrots, tomatoes, eggplant, squash, berries, fresh fruits, and dried fruits.   Cereals appear to be the richest source of fiber. Cereal fiber is found in whole grains and bran. Bran is the fiber-rich outer coat of cereal grain, which is largely removed in refining. In whole-grain cereals, the bran remains. In breakfast cereals, the largest amount of fiber is found in those with "bran" in their names. The fiber content is sometimes indicated on the label.   You may need to include additional fruits and vegetables each day.   In baking, for 1 cup white flour, you may use the following substitutions:   1 cup whole-wheat flour minus 2 tablespoons.   1/2 cup white flour plus 1/2 cup whole-wheat flour.   Low-Fat Diet BREADS, CEREALS, PASTA, RICE, DRIED PEAS, AND BEANS These products are high in carbohydrates and most are low in fat. Therefore, they can be increased in the diet as substitutes for fatty foods. They too, however, contain calories and should not be eaten in excess. Cereals can be eaten for snacks as well as for breakfast.  Include foods that contain fiber (fruits, vegetables, whole grains, and legumes). Research shows that fiber may lower blood cholesterol levels, especially the water-soluble fiber found in fruits, vegetables, oat products, and legumes. FRUITS AND VEGETABLES It is good to eat fruits and vegetables. Besides being sources of fiber, both are rich in vitamins and some minerals. They help you get the daily allowances of these nutrients. Fruits and vegetables can be used for snacks and desserts. MEATS Limit lean meat, chicken, [place], and fish to no more than 6 ounces per day. Beef, Pork, and Lamb Use lean cuts of beef, pork, and lamb. Lean cuts include:  Extra-lean ground beef.  Arm roast.  Sirloin tip.  Center-cut ham.  Round steak.    Loin chops.  Rump roast.  Tenderloin.  Trim all fat off the outside of meats before cooking. It is not necessary to severely decrease the intake of red meat, but lean choices should be made. Lean meat is rich in protein and contains a highly absorbable form of iron. Premenopausal women, in particular, should avoid reducing lean red meat because this could increase the risk for low red blood cells (iron-deficiency anemia). The organ meats, such as liver, sweetbreads, kidneys, and brain are very rich in cholesterol. They should be limited. Chicken and [place] These are good sources of protein. The fat of poultry can be reduced by removing the skin and  underlying fat layers before cooking. Chicken and [place] can be substituted for lean red meat in the diet. Poultry should not be fried or covered with high-fat sauces. Fish and Shellfish Fish is a good source of protein. Shellfish contain cholesterol, but they usually are low in saturated fatty acids. The preparation of fish is important. Like chicken and [place], they should not be fried or covered with high-fat sauces. EGGS Egg whites contain no fat or cholesterol. They can be eaten often. Try 1 to 2 egg whites instead of whole eggs in recipes or use egg substitutes that do not contain yolk. MILK AND DAIRY PRODUCTS Use skim or 1% milk instead of 2% or whole milk. Decrease whole milk, natural, and processed cheeses. Use nonfat or low-fat (2%) cottage cheese or low-fat cheeses made from vegetable oils. Choose nonfat or low-fat (1 to 2%) yogurt. Experiment with evaporated skim milk in recipes that call for heavy cream. Substitute low-fat yogurt or low-fat cottage cheese for sour cream in dips and salad dressings. Have at least 2 servings of low-fat dairy products, such as 2 glasses of skim (or 1%) milk each day to help get your daily calcium intake.  FATS AND OILS Reduce the total intake of fats, especially saturated fat. Butterfat, lard, and beef fats are  high in saturated fat and cholesterol. These should be avoided as much as possible. Vegetable fats do not contain cholesterol, but certain vegetable fats, such as coconut oil, palm oil, and palm kernel oil are very high in saturated fats. These should be limited. These fats are often used in bakery goods, processed foods, popcorn, oils, and nondairy creamers. Vegetable shortenings and some peanut butters contain hydrogenated oils, which are also saturated fats. Read the labels on these foods and check for saturated vegetable oils. Unsaturated vegetable oils and fats do not raise blood cholesterol. However, they should be limited because they are fats and are high in calories. Total fat should still be limited to 30% of your daily caloric intake. Desirable liquid vegetable oils are corn oil, cottonseed oil, olive oil, canola oil, safflower oil, soybean oil, and sunflower oil. Peanut oil is not as good, but small amounts are acceptable. Buy a heart-healthy tub margarine that has no partially hydrogenated oils in the ingredients. Mayonnaise and salad dressings often are made from unsaturated fats, but they should also be limited because of their high calorie and fat content. Seeds, nuts, peanut butter, olives, and avocados are high in fat, but the fat is mainly the unsaturated type. These foods should be limited mainly to avoid excess calories and fat. OTHER EATING TIPS Snacks  Most sweets should be limited as snacks. They tend to be rich in calories and fats, and their caloric content outweighs their nutritional value. Some good choices in snacks are graham crackers, melba toast, soda crackers, bagels (no egg), English muffins, fruits, and vegetables. These snacks are preferable to snack crackers, [place] fries, and chips. Popcorn should be air-popped or cooked in small amounts of liquid vegetable oil. Desserts Eat fruit, low-fat yogurt, and fruit ices. AVOID pastries, cake, and cookies. Sherbet, angel food  cake, gelatin dessert, frozen low-fat yogurt, or other frozen products that do not contain saturated fat (pure fruit juice bars, frozen ice pops) are also acceptable.  COOKING METHODS Choose those methods that use little or no fat. They include: Poaching.  Braising.  Steaming.  Grilling.  Baking.  Stir-frying.  Broiling.  Microwaving.  Foods can be cooked in a nonstick pan without added  fat, or use a nonfat cooking spray in regular cookware. Limit fried foods and avoid frying in saturated fat. Add moisture to lean meats by using water, broth, cooking wines, and other nonfat or low-fat sauces along with the cooking methods mentioned above. Soups and stews should be chilled after cooking. The fat that forms on top after a few hours in the refrigerator should be skimmed off. When preparing meals, avoid using excess salt. Salt can contribute to raising blood pressure in some people. EATING AWAY FROM HOME Order entres, potatoes, and vegetables without sauces or butter. When meat exceeds the size of a deck of cards (3 to 4 ounces), the rest can be taken home for another meal. Choose vegetable or fruit salads and ask for low-calorie salad dressings to be served on the side. Use dressings sparingly. Limit high-fat toppings, such as bacon, crumbled eggs, cheese, sunflower seeds, and olives. Ask for heart-healthy tub margarine instead of butter.  Hemorrhoids Hemorrhoids are dilated (enlarged) veins around the rectum. Sometimes clots will form in the veins. This makes them swollen and painful. These are called thrombosed hemorrhoids. Causes of hemorrhoids include:  Constipation.   Straining to have a bowel movement.   HEAVY LIFTING   HOME CARE INSTRUCTIONS  Eat a well balanced diet and drink 6 to 8 glasses of water every day to avoid constipation. You may also use a bulk laxative.   Avoid straining to have bowel movements.   Keep anal area dry and clean.   Do not use a donut shaped pillow or  sit on the toilet for long periods. This increases blood pooling and pain.   Move your bowels when your body has the urge; this will require less straining and will decrease pain and pressure.

## 2015-11-12 NOTE — Anesthesia Postprocedure Evaluation (Signed)
Anesthesia Post Note  Patient: [name]  Procedure(s) Performed: Procedure(s) (LRB): COLONOSCOPY WITH PROPOFOL (N/A) ESOPHAGOGASTRODUODENOSCOPY (EGD) WITH PROPOFOL (N/A) ESOPHAGEAL BANDING (N/A) POLYPECTOMY  Patient location during evaluation: PACU Anesthesia Type: MAC Level of consciousness: awake and alert and oriented Pain management: pain level controlled Vital Signs Assessment: post-procedure vital signs reviewed and stable Respiratory status: spontaneous breathing Cardiovascular status: blood pressure returned to baseline Postop Assessment: no signs of nausea or vomiting Anesthetic complications: no    Last Vitals:  Vitals:   11/12/15 1100 11/12/15 1255  BP: 128/88   Pulse: 86   Resp: 18   Temp:  36.6 C    Last Pain:  Vitals:   11/12/15 1005  PainSc: 6                  Jamie Bell

## 2015-11-12 NOTE — Op Note (Signed)
[facility] Patient Name: Jamie Bell Procedure Date: 11/12/2015 11:48 AM MRN: [id number] Date of Birth: May 22, 1970 Attending MD: Barney Drain , MD CSN: [id number] Age: 45 Admit Type: Outpatient Procedure:                Colonoscopy WITH COLD FORCEPS/SNARE CAUTERY                            POLYPECTOMY Indications:              Hematochezia WHILE TAKING BC POWDERS, PMHx:                            CIRRHOSIS(MELD Na 7) Providers:                Barney Drain, MD, Janeece Riggers, RN, [employer]. [place],                            [employer]chant [employer] officer Referring MD:             Jasper Loser. Luan Pulling, MD Medicines:                Propofol per Anesthesia Complications:            No immediate complications. Estimated Blood Loss:     Estimated blood loss was minimal. Procedure:                Pre-Anesthesia Assessment:                           - Prior to the procedure, a History and Physical                            was performed, and patient medications and                            allergies were reviewed. The patient's tolerance of                            previous anesthesia was also reviewed. The risks                            and benefits of the procedure and the sedation                            options and risks were discussed with the patient.                            All questions were answered, and informed consent                            was obtained. Prior Anticoagulants: The patient has                            taken aspirin, last dose was day of procedure.  After reviewing the risks and benefits, the patient                            was deemed in satisfactory condition to undergo the                            procedure. After obtaining informed consent, the                            colonoscope was passed under direct vision.                            Throughout the procedure, the patient's blood                            pressure,  pulse, and oxygen saturations were                            monitored continuously. The [id number]) was                            introduced through the anus and advanced to the 3                            cm into the ileum. The terminal ileum, ileocecal                            valve, appendiceal orifice, and rectum were                            photographed. The colonoscopy was performed without                            difficulty. The patient tolerated the procedure                            well. The quality of the bowel preparation was                            excellent. Scope In: 12:05:36 PM Scope Out: 12:26:54 PM Scope Withdrawal Time: 0 hours 19 minutes 36 seconds  Total Procedure Duration: 0 hours 21 minutes 18 seconds  Findings:      The digital rectal exam findings include decreased sphincter tone.      Two sessile polyps were found in the proximal transverse colon and       distal transverse colon. The polyps were 2 to 6 mm in size. These polyps       were removed with a hot snare. Resection and retrieval were complete.      Patchy mild inflammation characterized by congestion (edema), erosions       and erythema was found in the sigmoid colon and in the descending colon.       This was biopsied with a cold forceps for histology.      Non-bleeding internal hemorrhoids were found. The hemorrhoids were small. Impression:               -  Decreased sphincter tone found on digital rectal                            exam.                           - Two 2 to 6 mm polyps in the proximal transverse                            colon and in the distal transverse colon, removed                            with a hot snare. .                           - RECTAL BLEEDING DUE TO COLITIS AND HEMORRHOIDS Moderate Sedation:      Per Anesthesia Care Recommendation:           - High fiber diet and low fat diet.                           - Continue present medications.                            - Await pathology results.                           - Repeat colonoscopy in 3 - 5 years for                            surveillance.                           - Return to GI office in 6 months.                           - Patient has a contact number available for                            emergencies. The signs and symptoms of potential                            delayed complications were discussed with the                            patient. Return to normal activities tomorrow.                            Written discharge instructions were provided to the                            patient. Procedure Code(s):        --- Professional ---                           [phone], Colonoscopy, flexible; with removal of  tumor(s), polyp(s), or other lesion(s) by snare                            technique                           45380, 59, Colonoscopy, flexible; with biopsy,                            single or multiple Diagnosis Code(s):        --- Professional ---                           K62.89, Other specified diseases of anus and rectum                           D12.3, Benign neoplasm of transverse colon (hepatic                            flexure or splenic flexure)                           K52.9, Noninfective gastroenteritis and colitis,                            unspecified                           K64.8, Other hemorrhoids                           K92.1, Melena (includes Hematochezia) CPT copyright 2016 American Medical Association. All rights reserved. The codes documented in this report are preliminary and upon coder review may  be revised to meet current compliance requirements. Barney Drain, MD Barney Drain, MD 11/12/2015 12:45:19 PM This report has been signed electronically. Number of Addenda: 0

## 2015-11-12 NOTE — Anesthesia Preprocedure Evaluation (Signed)
Anesthesia Evaluation  Patient identified by MRN, date of birth, ID band Patient awake    Reviewed: Allergy & Precautions, NPO status , Patient's Chart, lab work & pertinent test results  Airway Mallampati: II   Neck ROM: full    Dental   Pulmonary Current Smoker,    breath sounds clear to auscultation       Cardiovascular hypertension,  Rhythm:regular Rate:Normal     Neuro/Psych PSYCHIATRIC DISORDERS Anxiety Depression    GI/Hepatic GERD  ,(+)     substance abuse  ,   Endo/Other  diabetes, Type 2  Renal/GU      Musculoskeletal  (+) Arthritis ,   Abdominal   Peds  Hematology   Anesthesia Other Findings   Reproductive/Obstetrics PCOS                             Anesthesia Physical Anesthesia Plan  ASA: II  Anesthesia Plan: MAC   Post-op Pain Management:    Induction: Intravenous  Airway Management Planned: Nasal Cannula  Additional Equipment:   Intra-op Plan:   Post-operative Plan:   Informed Consent: I have reviewed the patients History and Physical, chart, labs and discussed the procedure including the risks, benefits and alternatives for the proposed anesthesia with the patient or authorized representative who has indicated his/her understanding and acceptance.     Plan Discussed with: CRNA, Anesthesiologist and Surgeon  Anesthesia Plan Comments:         Anesthesia Quick Evaluation

## 2015-11-12 NOTE — Op Note (Signed)
[facility] Patient Name: Jamie Bell Procedure Date: 11/12/2015 12:28 PM MRN: [id number] Date of Birth: [birth year] Attending MD: Barney Drain , MD CSN: [id number] Age: 45 Admit Type: Outpatient Procedure:                Upper GI endoscopy WITH COLD FORCEPS BIOPSY Indications:              Screening procedure Providers:                Barney Drain, MD, Janeece Riggers, RN, [facility],                            Technician Referring MD:              Medicines:                Propofol per Anesthesia Complications:            No immediate complications. Estimated Blood Loss:     Estimated blood loss was minimal. Procedure:                Pre-Anesthesia Assessment:                           - Prior to the procedure, a History and Physical                            was performed, and patient medications and                            allergies were reviewed. The patient's tolerance of                            previous anesthesia was also reviewed. The risks                            and benefits of the procedure and the sedation                            options and risks were discussed with the patient.                            All questions were answered, and informed consent                            was obtained. Prior Anticoagulants: The patient has                            taken aspirin, last dose was day of procedure.                            After reviewing the risks and benefits, the patient                            was deemed in satisfactory condition to undergo the  procedure. After obtaining informed consent, the                            endoscope was passed under direct vision.                            Throughout the procedure, the patient's blood                            pressure, pulse, and oxygen saturations were                            monitored continuously. The [id number] ([id number])                            scope  was introduced through the mouth, and                            advanced to the second part of duodenum. The upper                            GI endoscopy was accomplished without difficulty.                            The patient tolerated the procedure well. Scope In: 12:34:19 PM Scope Out: 12:41:33 PM Total Procedure Duration: 0 hours 7 minutes 14 seconds  Findings:      The examined esophagus was normal.      Diffuse moderate inflammation characterized by congestion (edema),       erosions and erythema was found in the entire examined stomach. Biopsies       were taken with a cold forceps for Helicobacter pylori testing.      Patchy mild inflammation characterized by congestion (edema) and       erythema was found in the duodenal bulb.      The second portion of the duodenum was normal.      A small hiatal hernia was present. Impression:               - Normal esophagus. NO VARICES                           - MODERATE Gastritis AND MILD DUODENITIS DUE TO BC                            POWDER USE                           - SMAL HIATAL HERNIA Moderate Sedation:      Per Anesthesia Care Recommendation:           - High fiber diet and low fat diet.                           - Continue present medications.                           - Await pathology  results. NEEDS A1AT,                            CERULOPLASMIN, AND FASTING TRANSFERRIN/FERRITIN.                           - Return to my office in 4 months.                           - Patient has a contact number available for                            emergencies. The signs and symptoms of potential                            delayed complications were discussed with the                            patient. Return to normal activities tomorrow.                            Written discharge instructions were provided to the                            patient. Procedure Code(s):        --- Professional ---                            [phone], Esophagogastroduodenoscopy, flexible,                            transoral; with biopsy, single or multiple Diagnosis Code(s):        --- Professional ---                           K29.70, Gastritis, unspecified, without bleeding                           K29.80, Duodenitis without bleeding                           Z13.810, Encounter for screening for upper                            gastrointestinal disorder CPT copyright 2016 American [employer]. All rights reserved. The codes documented in this report are preliminary and upon coder review may  be revised to meet current compliance requirements. Barney Drain, MD Barney Drain, MD 11/12/2015 1:26:29 PM This report has been signed electronically. Number of Addenda: 0

## 2015-11-12 NOTE — Transfer of Care (Signed)
Immediate Anesthesia Transfer of Care Note  Patient: Jamie Bell  Procedure(s) Performed: Procedure(s) with comments: COLONOSCOPY WITH PROPOFOL (N/A) - 10:15 am ESOPHAGOGASTRODUODENOSCOPY (EGD) WITH PROPOFOL (N/A) ESOPHAGEAL BANDING (N/A) POLYPECTOMY - transverse colon polyp  Patient Location: PACU  Anesthesia Type:MAC  Level of Consciousness: awake and alert   Airway & Oxygen Therapy: Patient Spontanous Breathing  Post-op Assessment: Report given to RN  Post vital signs: Reviewed and stable  Last Vitals:  Vitals:   11/12/15 1100 11/12/15 1255  BP: 128/88   Pulse: 86   Resp: 18   Temp:  36.6 C    Last Pain:  Vitals:   11/12/15 1005  PainSc: 6          Complications: No apparent anesthesia complications

## 2015-11-13 LAB — CERULOPLASMIN: CERULOPLASMIN: 28.2 mg/dL (ref 19.0–39.0)

## 2015-11-13 LAB — ALPHA-1-ANTITRYPSIN: A-1 Antitrypsin, Ser: 134 mg/dL (ref 90–200)

## 2015-11-13 NOTE — Addendum Note (Signed)
Addendum  created 11/13/15 [id number] by Charmaine Downs, CRNA   Charge Capture section accepted

## 2015-11-18 ENCOUNTER — Encounter (HOSPITAL_COMMUNITY): Payer: Self-pay | Admitting: Gastroenterology

## 2015-11-19 ENCOUNTER — Other Ambulatory Visit: Payer: Self-pay | Admitting: Gastroenterology

## 2015-11-24 ENCOUNTER — Telehealth: Payer: Self-pay | Admitting: Gastroenterology

## 2015-11-24 NOTE — Telephone Encounter (Signed)
Please call pt. She had two simple adenomas removed. No other cause for her CIRRHOSIS WAS IDENTIFIED ON THE BLOOD TESTS.   DRINK WATER TO KEEP YOUR URINE LIGHT YELLOW.  FOLLOW A HIGH FIBER/LOW FAT DIET. AVOID ITEMS THAT CAUSE BLOATING.   AVOID ITEMS THAT TRIGGER GASTRITIS. SEE INFO BELOW.  CONTINUE PROTONIX. TAKE 30 MINUTES PRIOR TO MEALS TWICE DAILY. PROTONIX TREATS GASTRITIS AND REFLUX. IT PREVENTS ULCERS.  NEXT U/S IN MAR 2018 1 WEEK PRIOR TO HER NEXT OPV IN [facility] 2018 E30 CIRRHOSIS/RECTAL BLEEDING.  Next colonoscopy in 5-10 years.

## 2015-11-25 NOTE — Telephone Encounter (Signed)
ON RECALL AND APPOINTMENT MADE

## 2015-11-26 NOTE — Telephone Encounter (Signed)
Called and VM not set up, mailing a letter for pt to call.

## 2016-01-31 ENCOUNTER — Telehealth: Payer: Self-pay | Admitting: Gastroenterology

## 2016-01-31 ENCOUNTER — Encounter: Payer: Self-pay | Admitting: Gastroenterology

## 2016-01-31 ENCOUNTER — Ambulatory Visit: Payer: Medicare Other | Admitting: Gastroenterology

## 2016-01-31 NOTE — Telephone Encounter (Signed)
PT WAS A NO SHOW AND LETTER SENT

## 2016-03-11 DIAGNOSIS — J449 Chronic obstructive pulmonary disease, unspecified: Secondary | ICD-10-CM | POA: Diagnosis not present

## 2016-03-11 DIAGNOSIS — E119 Type 2 diabetes mellitus without complications: Secondary | ICD-10-CM | POA: Diagnosis not present

## 2016-03-11 DIAGNOSIS — M545 Low back pain: Secondary | ICD-10-CM | POA: Diagnosis not present

## 2016-03-11 DIAGNOSIS — K7460 Unspecified cirrhosis of liver: Secondary | ICD-10-CM | POA: Diagnosis not present

## 2016-03-18 ENCOUNTER — Ambulatory Visit (INDEPENDENT_AMBULATORY_CARE_PROVIDER_SITE_OTHER): Payer: Medicare Other | Admitting: Gastroenterology

## 2016-03-18 ENCOUNTER — Encounter: Payer: Self-pay | Admitting: Gastroenterology

## 2016-03-18 VITALS — BP 125/76 | HR 82 | Temp 97.6°F | Ht 65.0 in | Wt 176.4 lb

## 2016-03-18 DIAGNOSIS — K7460 Unspecified cirrhosis of liver: Secondary | ICD-10-CM

## 2016-03-18 DIAGNOSIS — K5900 Constipation, unspecified: Secondary | ICD-10-CM

## 2016-03-18 NOTE — Progress Notes (Signed)
Please let patient know I also need her to have HCV RNA quantitative test done one last time to make sure negative. RARE instance of so few HCV viruses present that may not be detected therefore one last check would support NO HCV present. She had planned to get our other labs done with PCP.

## 2016-03-18 NOTE — Assessment & Plan Note (Signed)
Stop Amitiza and miralax. Start Trulance 26m daily. Samples provided, if works call for rx.

## 2016-03-18 NOTE — Progress Notes (Signed)
Primary Care Physician: Alonza Bogus, MD  Primary Gastroenterologist:  Barney Drain, MD   Chief Complaint  Patient presents with  . Cirrhosis    HPI: Jamie Bell is a 46 y.o. female here for six month f/u. 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 She had EGD/TCS 10/2015. No varices on upper endoscopy. She had duodenitis/gastritis, no H.pylori. Small internal hemorrhoids. Two polyps (TA) removed. Congestion and edema noted in left colon, bx most c/w NSAID related.  She complains of abdominal cramping on Amitiza. Switched to miralax but takes once very 1-2 weeks. Complains of bloating but no abd pain. Complains of incomplete bowel movements. No melena, brbpr. Appetite is good. No heartburn. No etoh use. No confusion, pruritis. Trace lower extremity swelling.    Current Outpatient Prescriptions  Medication Sig Dispense Refill  . ALPRAZolam (XANAX) 0.25 MG tablet Take 1 tablet (0.25 mg total) by mouth 3 (three) times daily as needed for anxiety. 30 tablet 0  . INVOKAMET [id number] MG TABS Take 1 tablet by mouth 2 (two) times daily.    Marland Kitchen lubiprostone (AMITIZA) 24 MCG capsule Take 1 capsule (24 mcg total) by mouth 2 (two) times daily with a meal. 60 capsule 12  . nicotine (NICODERM CQ - DOSED IN MG/24 HOURS) 21 mg/24hr patch Place 1 patch (21 mg total) onto the skin daily. 28 patch 0  . oxyCODONE (ROXICODONE) 15 MG immediate release tablet Take 1 tablet by mouth 3 (three) times daily.     . pantoprazole (PROTONIX) 40 MG tablet Take 1 tablet (40 mg total) by mouth 2 (two) times daily. (Patient taking differently: Take 40 mg by mouth daily. ) 60 tablet 12  . polyethylene glycol (MIRALAX / GLYCOLAX) packet Take 17 g by mouth daily as needed for moderate constipation. 14 each 0  . pravastatin (PRAVACHOL) 20 MG tablet Take 20 mg by mouth daily.     Marland Kitchen SPIRIVA HANDIHALER 18 MCG inhalation capsule Place 18 mcg into inhaler and inhale daily as needed (sob).     Marland Kitchen doxycycline (VIBRA-TABS)  100 MG tablet Take 1 tablet (100 mg total) by mouth 2 (two) times daily. With food (Patient not taking: Reported on 11/06/2015) 14 tablet 0   No current facility-administered medications for this visit.     Allergies as of 03/18/2016 - Review Complete 03/18/2016  Allergen Reaction Noted  . Ciprofloxacin Swelling 05/20/2005  . Nsaids Hives 05/02/2012    ROS:  General: Negative for anorexia, weight loss, fever, chills, fatigue, weakness. ENT: Negative for hoarseness, difficulty swallowing , nasal congestion. CV: Negative for chest pain, angina, palpitations, dyspnea on exertion, trace peripheral edema.  Respiratory: Negative for dyspnea at rest, dyspnea on exertion, cough, sputum, wheezing.  GI: See history of present illness. GU:  Negative for dysuria, hematuria, urinary incontinence, urinary frequency, nocturnal urination.  Endo: Negative for unusual weight change.    Physical Examination:   BP 125/76   Pulse 82   Temp 97.6 F (36.4 C) (Oral)   Ht 5' 5"  (1.651 m)   Wt 176 lb 6.4 oz (80 kg)   BMI 29.35 kg/m   General: Well-nourished, well-developed in no acute distress. Female pattern baldness, hirsuitism  Eyes: No icterus. Mouth: Oropharyngeal mucosa moist and pink , no lesions erythema or exudate. Lungs: Clear to auscultation bilaterally.  Heart: Regular rate and rhythm, no murmurs rubs or gallops.  Abdomen: Bowel sounds are normal, nontender, nondistended, no hepatosplenomegaly or masses, no abdominal bruits or hernia , no rebound or guarding.  Extremities: trace lower extremity edema. No clubbing or deformities. Neuro: Alert and oriented x 4   Skin: Warm and dry, no jaundice.   Psych: Alert and cooperative, normal mood and affect.   Imaging Studies: No results found.

## 2016-03-18 NOTE — Assessment & Plan Note (Signed)
Likely NASH related. Due for hepatoma screening, labs, Hep B vaccination. Return in six months for follow up.

## 2016-03-18 NOTE — Patient Instructions (Addendum)
1. Please have labs done as discussed. 2. We will make arrangements for hepatitis B vaccination in the near future. 3. Abdominal ultrasound plan to look at your liver. You will need to have this done every 6 months as people with cirrhosis are at increased risk of liver cancer. 4. Stop and Amitiza and MiraLAX. Start Trulance 59m daily for constipation. Try samples first, call for RX if they work. 5. Return to the office in 6 months or sooner if needed.

## 2016-03-19 NOTE — Progress Notes (Signed)
cc'ed to pcp °

## 2016-03-20 NOTE — Progress Notes (Signed)
Tried to call. VM not set up. Mailing a letter to call.

## 2016-03-26 NOTE — Progress Notes (Signed)
PT is aware and will come by tomorrow morning to pick up the order and get all labs done at once.  I will leave it at front for her.   She also said to let Neil Crouch, PA know that she has had the abdominal pain all week, right below her right breast and the pain has been constant.  Today is first day she has been able to wear a bra, and she has been in bed most of the week, feeling some better today.   Please advise!

## 2016-03-27 ENCOUNTER — Ambulatory Visit (HOSPITAL_COMMUNITY)
Admission: RE | Admit: 2016-03-27 | Discharge: 2016-03-27 | Disposition: A | Discharge status: Home / Self Care | Payer: Medicare Other | Source: Ambulatory Visit | Attending: Gastroenterology | Admitting: Gastroenterology

## 2016-03-27 DIAGNOSIS — K7460 Unspecified cirrhosis of liver: Secondary | ICD-10-CM | POA: Insufficient documentation

## 2016-03-27 DIAGNOSIS — R161 Splenomegaly, not elsewhere classified: Secondary | ICD-10-CM | POA: Diagnosis not present

## 2016-04-01 NOTE — Progress Notes (Signed)
PT is aware and will do labs tomorrow morning.

## 2016-04-01 NOTE — Progress Notes (Signed)
U/s with cirrhosis but no hepatoma.  Her CBD is 12m. Can be seen after gb removal and in setting of chronic pain medications. I will discuss with slf.  Plan on abd u/s in six months.  I still need her labs done. Recently called in with ruq pain, see ov addendum.

## 2016-04-01 NOTE — Progress Notes (Signed)
Please see result note for recent u/s.   Can we find out if patient had her labs done and get the results to me? If she hasn't then she should go ahead and do them given recent ruq pain.

## 2016-04-01 NOTE — Progress Notes (Signed)
Tried to call pt. VM not set up. Mailing a letter to call.

## 2016-06-02 ENCOUNTER — Ambulatory Visit (HOSPITAL_COMMUNITY)
Admission: RE | Admit: 2016-06-02 | Discharge: 2016-06-02 | Disposition: A | Discharge status: Home / Self Care | Payer: Medicare Other | Source: Ambulatory Visit | Attending: Pulmonary Disease | Admitting: Pulmonary Disease

## 2016-06-02 ENCOUNTER — Other Ambulatory Visit (HOSPITAL_COMMUNITY): Payer: Self-pay | Admitting: Pulmonary Disease

## 2016-06-02 DIAGNOSIS — M79641 Pain in right hand: Secondary | ICD-10-CM

## 2016-06-02 DIAGNOSIS — M7989 Other specified soft tissue disorders: Secondary | ICD-10-CM

## 2016-06-02 DIAGNOSIS — J449 Chronic obstructive pulmonary disease, unspecified: Secondary | ICD-10-CM | POA: Diagnosis not present

## 2016-06-02 DIAGNOSIS — I10 Essential (primary) hypertension: Secondary | ICD-10-CM | POA: Diagnosis not present

## 2016-06-02 DIAGNOSIS — E119 Type 2 diabetes mellitus without complications: Secondary | ICD-10-CM | POA: Diagnosis not present

## 2016-06-02 DIAGNOSIS — M5137 Other intervertebral disc degeneration, lumbosacral region: Secondary | ICD-10-CM | POA: Diagnosis not present

## 2016-06-03 NOTE — Progress Notes (Signed)
Dr. Oneida Alar, would you recommend continued abd u/s for hepatoma screening and follow biliary dilation OR go ahead with MRI/MRCP for biliary dilation?

## 2016-06-04 NOTE — Progress Notes (Signed)
[place] MAR 2018 MILDY DILATED CBD AFTER CHOLECYSTECTOMY. REPEAT HFP. IF CHOLESTATIC PATTERN REVEALED, ORDER MRCP. OTHERWISE, REPEAT HFP Q6 MOS.

## 2016-06-05 NOTE — Progress Notes (Signed)
Patient needs to have her labs done. CMET, PT/INR, CBC. If her LFTs are c/w cholestatic pattern she may need MRCP.

## 2016-06-08 DIAGNOSIS — I10 Essential (primary) hypertension: Secondary | ICD-10-CM | POA: Diagnosis not present

## 2016-06-08 DIAGNOSIS — J449 Chronic obstructive pulmonary disease, unspecified: Secondary | ICD-10-CM | POA: Diagnosis not present

## 2016-06-08 DIAGNOSIS — M5137 Other intervertebral disc degeneration, lumbosacral region: Secondary | ICD-10-CM | POA: Diagnosis not present

## 2016-06-08 DIAGNOSIS — L03011 Cellulitis of right finger: Secondary | ICD-10-CM | POA: Diagnosis not present

## 2016-06-08 NOTE — Progress Notes (Signed)
PT said she will try to do this blood work before the week is up.

## 2016-06-09 ENCOUNTER — Other Ambulatory Visit (HOSPITAL_COMMUNITY): Payer: Self-pay | Admitting: Pulmonary Disease

## 2016-06-09 DIAGNOSIS — M79644 Pain in right finger(s): Secondary | ICD-10-CM

## 2016-06-09 DIAGNOSIS — L03011 Cellulitis of right finger: Secondary | ICD-10-CM

## 2016-06-15 ENCOUNTER — Ambulatory Visit (HOSPITAL_COMMUNITY)
Admission: RE | Admit: 2016-06-15 | Discharge: 2016-06-15 | Disposition: A | Discharge status: Home / Self Care | Payer: Medicare Other | Source: Ambulatory Visit | Attending: Pulmonary Disease | Admitting: Pulmonary Disease

## 2016-06-15 DIAGNOSIS — L03011 Cellulitis of right finger: Secondary | ICD-10-CM | POA: Diagnosis not present

## 2016-06-15 DIAGNOSIS — M79644 Pain in right finger(s): Secondary | ICD-10-CM | POA: Insufficient documentation

## 2016-06-15 DIAGNOSIS — S66318A Strain of extensor muscle, fascia and tendon of other finger at wrist and hand level, initial encounter: Secondary | ICD-10-CM | POA: Insufficient documentation

## 2016-06-15 DIAGNOSIS — R600 Localized edema: Secondary | ICD-10-CM | POA: Diagnosis not present

## 2016-06-15 DIAGNOSIS — X58XXXA Exposure to other specified factors, initial encounter: Secondary | ICD-10-CM | POA: Insufficient documentation

## 2016-06-15 DIAGNOSIS — S63632A Sprain of interphalangeal joint of right middle finger, initial encounter: Secondary | ICD-10-CM | POA: Diagnosis not present

## 2016-06-29 NOTE — Progress Notes (Signed)
NIC for abd u/s in 09/2016 for hepatoma screening and biliary dilation.

## 2016-06-30 NOTE — Progress Notes (Signed)
ON RECALL  °

## 2016-07-02 ENCOUNTER — Other Ambulatory Visit: Payer: Self-pay | Admitting: Orthopedic Surgery

## 2016-07-02 ENCOUNTER — Encounter (HOSPITAL_COMMUNITY): Payer: Self-pay | Admitting: *Deleted

## 2016-07-02 DIAGNOSIS — S66529A Laceration of intrinsic muscle, fascia and tendon of unspecified finger at wrist and hand level, initial encounter: Secondary | ICD-10-CM | POA: Diagnosis not present

## 2016-07-02 DIAGNOSIS — L03011 Cellulitis of right finger: Secondary | ICD-10-CM | POA: Diagnosis not present

## 2016-07-02 DIAGNOSIS — S61209A Unspecified open wound of unspecified finger without damage to nail, initial encounter: Secondary | ICD-10-CM | POA: Diagnosis not present

## 2016-07-03 ENCOUNTER — Ambulatory Visit (HOSPITAL_COMMUNITY)
Admission: RE | Admit: 2016-07-03 | Discharge: 2016-07-03 | Disposition: A | Discharge status: Home / Self Care | Payer: Medicare Other | Source: Ambulatory Visit | Attending: Orthopedic Surgery | Admitting: Orthopedic Surgery

## 2016-07-03 ENCOUNTER — Encounter (HOSPITAL_COMMUNITY): Payer: Self-pay | Admitting: *Deleted

## 2016-07-03 ENCOUNTER — Ambulatory Visit (HOSPITAL_COMMUNITY): Payer: Medicare Other | Admitting: Critical Care Medicine

## 2016-07-03 ENCOUNTER — Encounter (HOSPITAL_COMMUNITY)
Admission: RE | Disposition: A | Discharge status: Home / Self Care | Payer: Self-pay | Source: Ambulatory Visit | Attending: Orthopedic Surgery

## 2016-07-03 DIAGNOSIS — Z8249 Family history of ischemic heart disease and other diseases of the circulatory system: Secondary | ICD-10-CM | POA: Diagnosis not present

## 2016-07-03 DIAGNOSIS — E282 Polycystic ovarian syndrome: Secondary | ICD-10-CM | POA: Insufficient documentation

## 2016-07-03 DIAGNOSIS — F17210 Nicotine dependence, cigarettes, uncomplicated: Secondary | ICD-10-CM | POA: Diagnosis not present

## 2016-07-03 DIAGNOSIS — R0600 Dyspnea, unspecified: Secondary | ICD-10-CM | POA: Insufficient documentation

## 2016-07-03 DIAGNOSIS — H9190 Unspecified hearing loss, unspecified ear: Secondary | ICD-10-CM | POA: Insufficient documentation

## 2016-07-03 DIAGNOSIS — Z8614 Personal history of Methicillin resistant Staphylococcus aureus infection: Secondary | ICD-10-CM | POA: Diagnosis not present

## 2016-07-03 DIAGNOSIS — M5430 Sciatica, unspecified side: Secondary | ICD-10-CM | POA: Insufficient documentation

## 2016-07-03 DIAGNOSIS — Z9049 Acquired absence of other specified parts of digestive tract: Secondary | ICD-10-CM | POA: Diagnosis not present

## 2016-07-03 DIAGNOSIS — Z888 Allergy status to other drugs, medicaments and biological substances status: Secondary | ICD-10-CM | POA: Insufficient documentation

## 2016-07-03 DIAGNOSIS — S61212A Laceration without foreign body of right middle finger without damage to nail, initial encounter: Secondary | ICD-10-CM | POA: Diagnosis not present

## 2016-07-03 DIAGNOSIS — F329 Major depressive disorder, single episode, unspecified: Secondary | ICD-10-CM | POA: Insufficient documentation

## 2016-07-03 DIAGNOSIS — Z86010 Personal history of colonic polyps: Secondary | ICD-10-CM | POA: Diagnosis not present

## 2016-07-03 DIAGNOSIS — X58XXXA Exposure to other specified factors, initial encounter: Secondary | ICD-10-CM | POA: Diagnosis not present

## 2016-07-03 DIAGNOSIS — Z833 Family history of diabetes mellitus: Secondary | ICD-10-CM | POA: Insufficient documentation

## 2016-07-03 DIAGNOSIS — K219 Gastro-esophageal reflux disease without esophagitis: Secondary | ICD-10-CM | POA: Diagnosis not present

## 2016-07-03 DIAGNOSIS — Z8379 Family history of other diseases of the digestive system: Secondary | ICD-10-CM | POA: Diagnosis not present

## 2016-07-03 DIAGNOSIS — Z9842 Cataract extraction status, left eye: Secondary | ICD-10-CM | POA: Diagnosis not present

## 2016-07-03 DIAGNOSIS — G8929 Other chronic pain: Secondary | ICD-10-CM | POA: Diagnosis not present

## 2016-07-03 DIAGNOSIS — B951 Streptococcus, group B, as the cause of diseases classified elsewhere: Secondary | ICD-10-CM | POA: Insufficient documentation

## 2016-07-03 DIAGNOSIS — M549 Dorsalgia, unspecified: Secondary | ICD-10-CM | POA: Insufficient documentation

## 2016-07-03 DIAGNOSIS — Z881 Allergy status to other antibiotic agents status: Secondary | ICD-10-CM | POA: Insufficient documentation

## 2016-07-03 DIAGNOSIS — S61302A Unspecified open wound of right middle finger with damage to nail, initial encounter: Secondary | ICD-10-CM | POA: Diagnosis not present

## 2016-07-03 DIAGNOSIS — E119 Type 2 diabetes mellitus without complications: Secondary | ICD-10-CM | POA: Insufficient documentation

## 2016-07-03 DIAGNOSIS — I10 Essential (primary) hypertension: Secondary | ICD-10-CM | POA: Diagnosis not present

## 2016-07-03 DIAGNOSIS — K7581 Nonalcoholic steatohepatitis (NASH): Secondary | ICD-10-CM | POA: Diagnosis not present

## 2016-07-03 DIAGNOSIS — Z79899 Other long term (current) drug therapy: Secondary | ICD-10-CM | POA: Insufficient documentation

## 2016-07-03 DIAGNOSIS — Z1639 Resistance to other specified antimicrobial drug: Secondary | ICD-10-CM | POA: Diagnosis not present

## 2016-07-03 DIAGNOSIS — S66522A Laceration of intrinsic muscle, fascia and tendon of right middle finger at wrist and hand level, initial encounter: Secondary | ICD-10-CM | POA: Diagnosis not present

## 2016-07-03 HISTORY — DX: Pneumonia, unspecified organism: J18.9

## 2016-07-03 HISTORY — DX: Constipation, unspecified: K59.00

## 2016-07-03 HISTORY — PX: INCISION AND DRAINAGE WOUND WITH TENDON REPAIR: SHX5639

## 2016-07-03 HISTORY — DX: Nonalcoholic steatohepatitis (NASH): K75.81

## 2016-07-03 HISTORY — DX: Dyspnea, unspecified: R06.00

## 2016-07-03 LAB — COMPREHENSIVE METABOLIC PANEL
ALT: 44 U/L (ref 14–54)
AST: 50 U/L — ABNORMAL HIGH (ref 15–41)
Albumin: 3 g/dL — ABNORMAL LOW (ref 3.5–5.0)
Alkaline Phosphatase: 124 U/L (ref 38–126)
Anion gap: 6 (ref 5–15)
BUN: 9 mg/dL (ref 6–20)
CO2: 25 mmol/L (ref 22–32)
Calcium: 9.1 mg/dL (ref 8.9–10.3)
Chloride: 103 mmol/L (ref 101–111)
Creatinine, Ser: 0.73 mg/dL (ref 0.44–1.00)
GFR calc Af Amer: >60 mL/min (ref >60)
GFR calc non Af Amer: >60 mL/min (ref >60)
Glucose, Bld: 159 mg/dL — ABNORMAL HIGH (ref 65–99)
Potassium: 3.7 mmol/L (ref 3.5–5.1)
Sodium: 134 mmol/L — ABNORMAL LOW (ref 135–145)
Total Bilirubin: 0.8 mg/dL (ref 0.3–1.2)
Total Protein: 7.2 g/dL (ref 6.5–8.1)

## 2016-07-03 LAB — CBC
HCT: 42.3 % (ref 36.0–46.0)
HEMOGLOBIN: 13.9 g/dL (ref 12.0–15.0)
MCH: 30.4 pg (ref 26.0–34.0)
MCHC: 32.9 g/dL (ref 30.0–36.0)
MCV: 92.6 fL (ref 78.0–100.0)
PLATELETS: 98 10*3/uL — AB (ref 150–400)
RBC: 4.57 MIL/uL (ref 3.87–5.11)
RDW: 15.5 % (ref 11.5–15.5)
WBC: 4.9 10*3/uL (ref 4.0–10.5)

## 2016-07-03 LAB — GLUCOSE, CAPILLARY
Glucose-Capillary: 176 mg/dL — ABNORMAL HIGH (ref 65–99)
Glucose-Capillary: 114 mg/dL — ABNORMAL HIGH (ref 65–99)

## 2016-07-03 LAB — SURGICAL PCR SCREEN
MRSA, PCR: NEGATIVE
STAPHYLOCOCCUS AUREUS: NEGATIVE

## 2016-07-03 LAB — HCG, SERUM, QUALITATIVE: Preg, Serum: NEGATIVE

## 2016-07-03 SURGERY — INCISION AND DRAINAGE WOUND WITH TENDON REPAIR
Anesthesia: General | Laterality: Right

## 2016-07-03 MED ORDER — NICOTINE 21 MG/24HR TD PT24
21.0000 mg | MEDICATED_PATCH | Freq: Every day | TRANSDERMAL | Status: DC
  Administered 2016-07-03: 21 mg via TRANSDERMAL
  Filled 2016-07-03: qty 1

## 2016-07-03 MED ORDER — BUPIVACAINE HCL (PF) 0.25 % IJ SOLN
INTRAMUSCULAR | Status: DC | PRN
  Administered 2016-07-03: 5 mL

## 2016-07-03 MED ORDER — LIDOCAINE 2% (20 MG/ML) 5 ML SYRINGE
INTRAMUSCULAR | Status: DC | PRN
  Administered 2016-07-03: 80 mg via INTRAVENOUS

## 2016-07-03 MED ORDER — MIDAZOLAM HCL 2 MG/2ML IJ SOLN
INTRAMUSCULAR | Status: DC | PRN
  Administered 2016-07-03: 2 mg via INTRAVENOUS

## 2016-07-03 MED ORDER — BUPIVACAINE HCL (PF) 0.25 % IJ SOLN
INTRAMUSCULAR | Status: AC
  Filled 2016-07-03: qty 30

## 2016-07-03 MED ORDER — ONDANSETRON HCL 4 MG/2ML IJ SOLN
INTRAMUSCULAR | Status: DC | PRN
  Administered 2016-07-03: 4 mg via INTRAVENOUS

## 2016-07-03 MED ORDER — SODIUM CHLORIDE 0.9 % IR SOLN
Status: DC | PRN
  Administered 2016-07-03: 1000 mL

## 2016-07-03 MED ORDER — HYDROMORPHONE HCL 1 MG/ML IJ SOLN
0.2500 mg | INTRAMUSCULAR | Status: DC | PRN
  Administered 2016-07-03: 0.5 mg via INTRAVENOUS

## 2016-07-03 MED ORDER — PROPOFOL 10 MG/ML IV BOLUS
INTRAVENOUS | Status: DC | PRN
  Administered 2016-07-03: 200 mg via INTRAVENOUS

## 2016-07-03 MED ORDER — HYDROMORPHONE HCL 1 MG/ML IJ SOLN
INTRAMUSCULAR | Status: AC
  Filled 2016-07-03: qty 0.5

## 2016-07-03 MED ORDER — OXYCODONE-ACETAMINOPHEN 5-325 MG PO TABS: 1.0000 | ORAL_TABLET | ORAL | 0 refills | Status: DC | PRN

## 2016-07-03 MED ORDER — CEFAZOLIN SODIUM 1 G IJ SOLR
INTRAMUSCULAR | Status: DC | PRN
  Administered 2016-07-03: 2 g via INTRAMUSCULAR

## 2016-07-03 MED ORDER — FENTANYL CITRATE (PF) 250 MCG/5ML IJ SOLN
INTRAMUSCULAR | Status: AC
  Filled 2016-07-03: qty 5

## 2016-07-03 MED ORDER — FENTANYL CITRATE (PF) 250 MCG/5ML IJ SOLN
INTRAMUSCULAR | Status: DC | PRN
  Administered 2016-07-03 (×6): 25 ug via INTRAVENOUS
  Administered 2016-07-03: 50 ug via INTRAVENOUS
  Administered 2016-07-03 (×2): 25 ug via INTRAVENOUS

## 2016-07-03 MED ORDER — MIDAZOLAM HCL 2 MG/2ML IJ SOLN
INTRAMUSCULAR | Status: AC
  Filled 2016-07-03: qty 2

## 2016-07-03 MED ORDER — DEXAMETHASONE SODIUM PHOSPHATE 10 MG/ML IJ SOLN
INTRAMUSCULAR | Status: DC | PRN
  Administered 2016-07-03: 4 mg via INTRAVENOUS

## 2016-07-03 MED ORDER — CHLORHEXIDINE GLUCONATE 4 % EX LIQD: 60.0000 mL | Freq: Once | CUTANEOUS | Status: DC

## 2016-07-03 MED ORDER — LACTATED RINGERS IV SOLN
INTRAVENOUS | Status: DC
  Administered 2016-07-03: 12:00:00 via INTRAVENOUS

## 2016-07-03 MED ORDER — PROMETHAZINE HCL 25 MG/ML IJ SOLN: 6.2500 mg | INTRAMUSCULAR | Status: DC | PRN

## 2016-07-03 SURGICAL SUPPLY — 60 items
BANDAGE ACE 3X5.8 VEL STRL LF (GAUZE/BANDAGES/DRESSINGS) ×3 IMPLANT
BANDAGE ACE 4X5 VEL STRL LF (GAUZE/BANDAGES/DRESSINGS) ×3 IMPLANT
BLADE 15 SAFETY STRL DISP (BLADE) ×2 IMPLANT
BNDG CMPR 9X4 STRL LF SNTH (GAUZE/BANDAGES/DRESSINGS) ×1
BNDG COHESIVE 1X5 TAN STRL LF (GAUZE/BANDAGES/DRESSINGS) ×2 IMPLANT
BNDG CONFORM 2 STRL LF (GAUZE/BANDAGES/DRESSINGS) IMPLANT
BNDG ESMARK 4X9 LF (GAUZE/BANDAGES/DRESSINGS) ×2 IMPLANT
BNDG GAUZE ELAST 4 BULKY (GAUZE/BANDAGES/DRESSINGS) ×6 IMPLANT
CORDS BIPOLAR (ELECTRODE) IMPLANT
COVER SURGICAL LIGHT HANDLE (MISCELLANEOUS) ×2 IMPLANT
CUFF TOURNIQUET SINGLE 18IN (TOURNIQUET CUFF) ×3 IMPLANT
DECANTER SPIKE VIAL GLASS SM (MISCELLANEOUS) ×3 IMPLANT
DRAIN PENROSE 1/4X12 LTX STRL (WOUND CARE) IMPLANT
DRAPE SURG 17X23 STRL (DRAPES) ×3 IMPLANT
DRSG PAD ABDOMINAL 8X10 ST (GAUZE/BANDAGES/DRESSINGS) IMPLANT
DURAPREP 26ML APPLICATOR (WOUND CARE) IMPLANT
ELECT REM PT RETURN 9FT ADLT (ELECTROSURGICAL)
ELECTRODE REM PT RTRN 9FT ADLT (ELECTROSURGICAL) IMPLANT
GAUZE PACKING IODOFORM 1/4X5 (PACKING) IMPLANT
GAUZE SPONGE 4X4 12PLY STRL (GAUZE/BANDAGES/DRESSINGS) ×4 IMPLANT
GAUZE XEROFORM 1X8 LF (GAUZE/BANDAGES/DRESSINGS) ×3 IMPLANT
GLOVE BIOGEL PI IND STRL 6.5 (GLOVE) IMPLANT
GLOVE BIOGEL PI INDICATOR 6.5 (GLOVE) ×2
GLOVE SURG SYN 8.0 (GLOVE) ×3 IMPLANT
GOWN STRL REUS W/ TWL LRG LVL3 (GOWN DISPOSABLE) ×1 IMPLANT
GOWN STRL REUS W/ TWL XL LVL3 (GOWN DISPOSABLE) ×1 IMPLANT
GOWN STRL REUS W/TWL LRG LVL3 (GOWN DISPOSABLE) ×3
GOWN STRL REUS W/TWL XL LVL3 (GOWN DISPOSABLE) ×3
HANDPIECE INTERPULSE COAX TIP (DISPOSABLE)
KIT BASIN OR (CUSTOM PROCEDURE TRAY) ×3 IMPLANT
KIT ROOM TURNOVER OR (KITS) ×3 IMPLANT
MANIFOLD NEPTUNE II (INSTRUMENTS) ×3 IMPLANT
NDL HYPO 25GX1X1/2 BEV (NEEDLE) IMPLANT
NDL HYPO 25X1 1.5 SAFETY (NEEDLE) ×1 IMPLANT
NEEDLE HYPO 25GX1X1/2 BEV (NEEDLE) IMPLANT
NEEDLE HYPO 25X1 1.5 SAFETY (NEEDLE) ×3 IMPLANT
NS IRRIG 1000ML POUR BTL (IV SOLUTION) ×3 IMPLANT
PACK ORTHO EXTREMITY (CUSTOM PROCEDURE TRAY) ×3 IMPLANT
PAD ARMBOARD 7.5X6 YLW CONV (MISCELLANEOUS) ×6 IMPLANT
PAD CAST 4YDX4 CTTN HI CHSV (CAST SUPPLIES) ×2 IMPLANT
PADDING CAST COTTON 4X4 STRL (CAST SUPPLIES)
SET HNDPC FAN SPRY TIP SCT (DISPOSABLE) IMPLANT
SPLINT FINGER (SOFTGOODS) ×2 IMPLANT
SPONGE LAP 18X18 X RAY DECT (DISPOSABLE) ×3 IMPLANT
SUCTION FRAZIER HANDLE 10FR (MISCELLANEOUS) ×2
SUCTION TUBE FRAZIER 10FR DISP (MISCELLANEOUS) ×1 IMPLANT
SUT ETHILON 4 0 PS 2 18 (SUTURE) ×4 IMPLANT
SUT FIBERWIRE 3-0 18 TAPR NDL (SUTURE) ×3
SUT VICRYL RAPIDE 4/0 PS 2 (SUTURE) IMPLANT
SUTURE FIBERWR 3-0 18 TAPR NDL (SUTURE) IMPLANT
SWAB CULTURE ESWAB REG 1ML (MISCELLANEOUS) IMPLANT
SYR 20CC LL (SYRINGE) IMPLANT
SYR CONTROL 10ML LL (SYRINGE) ×3 IMPLANT
TOWEL OR 17X24 6PK STRL BLUE (TOWEL DISPOSABLE) ×3 IMPLANT
TOWEL OR 17X26 10 PK STRL BLUE (TOWEL DISPOSABLE) ×3 IMPLANT
TUBE CONNECTING 12'X1/4 (SUCTIONS) ×1
TUBE CONNECTING 12X1/4 (SUCTIONS) ×2 IMPLANT
UNDERPAD 30X30 (UNDERPADS AND DIAPERS) ×3 IMPLANT
WATER STERILE IRR 1000ML POUR (IV SOLUTION) ×3 IMPLANT
YANKAUER SUCT BULB TIP NO VENT (SUCTIONS) ×3 IMPLANT

## 2016-07-03 NOTE — Anesthesia Postprocedure Evaluation (Signed)
Anesthesia Post Note  Patient: Jamie Bell  Procedure(s) Performed: Procedure(s) (LRB): INCISION AND DRAINAGE RIGHT LONG PROXIMAL INTERPHALANGEAL JOINT WOUND WITH  TENDON REPAIR AND CULTURES (Right)     Patient location during evaluation: PACU Anesthesia Type: General Level of consciousness: awake and alert Pain management: pain level controlled Vital Signs Assessment: post-procedure vital signs reviewed and stable Respiratory status: spontaneous breathing, nonlabored ventilation and respiratory function stable Cardiovascular status: blood pressure returned to baseline and stable Postop Assessment: no signs of nausea or vomiting Anesthetic complications: no    Last Vitals:  Vitals:   07/03/16 1602 07/03/16 1615  BP:  (!) 147/83  Pulse: 74   Resp: 18   Temp: 36.1 C     Last Pain:  Vitals:   07/03/16 1604  TempSrc:   PainSc: 0-No pain                 Bettymae Yott A.

## 2016-07-03 NOTE — Anesthesia Preprocedure Evaluation (Signed)
Anesthesia Evaluation  Patient identified by MRN, date of birth, ID band Patient awake    Reviewed: Allergy & Precautions, NPO status , Patient's Chart, lab work & pertinent test results  History of Anesthesia Complications Negative for: history of anesthetic complications  Airway Mallampati: II  TM Distance: >3 FB Neck ROM: Full    Dental  (+) Missing, Poor Dentition, Dental Advisory Given   Pulmonary Current Smoker,    Pulmonary exam normal        Cardiovascular hypertension, Normal cardiovascular exam     Neuro/Psych PSYCHIATRIC DISORDERS Anxiety Depression negative neurological ROS     GI/Hepatic GERD  ,(+) Hepatitis -  Endo/Other  diabetes  Renal/GU negative Renal ROS     Musculoskeletal   Abdominal   Peds  Hematology   Anesthesia Other Findings   Reproductive/Obstetrics                             Anesthesia Physical Anesthesia Plan  ASA: III  Anesthesia Plan: General   Post-op Pain Management:    Induction: Intravenous  PONV Risk Score and Plan: 3 and Ondansetron, Dexamethasone and Treatment may vary due to age  Airway Management Planned: LMA  Additional Equipment:   Intra-op Plan:   Post-operative Plan: Extubation in OR  Informed Consent: I have reviewed the patients History and Physical, chart, labs and discussed the procedure including the risks, benefits and alternatives for the proposed anesthesia with the patient or authorized representative who has indicated his/her understanding and acceptance.     Plan Discussed with: CRNA and Anesthesiologist  Anesthesia Plan Comments:         Anesthesia Quick Evaluation

## 2016-07-03 NOTE — H&P (Signed)
Jamie Bell is an 46 y.o. female.   Chief Complaint: Right long finger pain, swelling, deformity and drainage. 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 Chronic back pain   . Constipation   . DDD (degenerative disc disease)   . Decreased hearing on the right.   F/u by ENT in the past and no further management.  . Depression   . Diabetes mellitus   . Dyspnea    with exertion  . GERD (gastroesophageal reflux disease)   . Hypertension    took med. for HTN, several yrs. ago, no longer needs , seen by Dr. Lattie Haw last yr. during a hosp. at [facility], for MRSA bacteremia   . MRSA bacteremia 2014   Tx with Vancomycin  . NASH (nonalcoholic steatohepatitis)   . PCOS (polycystic ovarian syndrome)   . Pneumonia 2016  . Polysubstance abuse   . Sciatica    HNP- Cerv. 7- T2    Past Surgical History:  Procedure Laterality Date  . BACK SURGERY    . BREAST SURGERY Left    blocked milk ducts   . CARPAL TUNNEL RELEASE    . CATARACT EXTRACTION W/PHACO Left 02/04/2015   Procedure: CATARACT EXTRACTION PHACO AND INTRAOCULAR LENS PLACEMENT (IOC);  Surgeon: [name], MD;  Location: [facility];  Service: Ophthalmology;  Laterality: Left;  CDE: 4.11  . CHOLECYSTECTOMY    . COLONOSCOPY WITH PROPOFOL N/A 11/12/2015   Procedure: COLONOSCOPY WITH PROPOFOL;  Surgeon: [name], MD;  Location: [facility];  Service: Endoscopy;  Laterality: N/A;  10:15 am  . ESOPHAGEAL BANDING N/A 11/12/2015   Procedure: ESOPHAGEAL BANDING;  Surgeon: [name], MD;  Location: [facility];  Service: Endoscopy;  Laterality: N/A;  . ESOPHAGOGASTRODUODENOSCOPY  2007   Dr. Oneida Alar: mild antral erythema.  Future procedures need to be done with Propofol per notes.   . ESOPHAGOGASTRODUODENOSCOPY (EGD) WITH PROPOFOL N/A 11/12/2015   Procedure: ESOPHAGOGASTRODUODENOSCOPY (EGD) WITH PROPOFOL;  Surgeon: [name], MD;  Location: [facility];  Service: Endoscopy;  Laterality: N/A;  . NECK SURGERY    . PERIPHERALLY INSERTED CENTRAL CATHETER INSERTION Right 2007   for treatment with Vancomycin   . POLYPECTOMY  11/12/2015   Procedure: POLYPECTOMY;  Surgeon: [name], MD;  Location: [facility];  Service: Endoscopy;;  transverse colon polyp  . POSTERIOR CERVICAL LAMINECTOMY N/A 11/22/2013   Procedure: POSTERIOR CERVICAL LAMINECTOMY C7/T1, T/1-2;  Surgeon: [name], MD;  Location: [place] [facility];  Service: Neurosurgery;  Laterality: N/A;  POSTERIOR CERVICAL LAMINECTOMY C7/T1, T/1-2  . TEE WITHOUT CARDIOVERSION N/A 08/09/2012   Procedure: TRANSESOPHAGEAL ECHOCARDIOGRAM (TEE);  Surgeon: [name], MD;  Location: [facility];  Service: Cardiovascular;  Laterality: N/A;    Family History  Problem Relation Age of Onset  . Hypertension Mother   . Hypertension Father   . Cirrhosis Father        Drank some alcohol  . Diabetes Brother   . Diabetes Other   . Cirrhosis Paternal Aunt        No alcohol  . Cirrhosis Paternal Aunt        No alcohol  . Colon cancer Neg  Hx    Social History:  reports that she has been smoking Cigarettes.  She has a 20.00 pack-year smoking history. She has never used smokeless tobacco. She reports that she does not drink alcohol or use drugs.  Allergies:  Allergies  Allergen Reactions  . Ciprofloxacin Swelling  . Nsaids Hives    Medications Prior to Admission  Medication Sig Dispense Refill  . ALPRAZolam (XANAX) 0.25 MG tablet Take 1 tablet (0.25 mg total) by mouth 3 (three) times daily as needed for anxiety. 30 tablet 0  . INVOKAMET [id number] MG TABS Take 1 tablet by mouth 2 (two) times daily.    Marland Kitchen oxyCODONE (ROXICODONE) 15 MG immediate release  tablet Take 15-30 mg by mouth See admin instructions. 1-2 tabs in am according to pain, then 1  Tab every 6 hours after as needed for pain    . pantoprazole (PROTONIX) 40 MG tablet Take 1 tablet (40 mg total) by mouth 2 (two) times daily. 60 tablet 12  . pravastatin (PRAVACHOL) 20 MG tablet Take 20 mg by mouth daily.     Marland Kitchen SPIRIVA HANDIHALER 18 MCG inhalation capsule Place 18 mcg into inhaler and inhale daily as needed (sob).     . nicotine (NICODERM CQ - DOSED IN MG/24 HOURS) 21 mg/24hr patch Place 1 patch (21 mg total) onto the skin daily. (Patient taking differently: Place 21 mg onto the skin daily as needed (to quit smoking). ) 28 patch 0    Results for orders placed or performed during the hospital encounter of 07/03/16 (from the past 48 hour(s))  CBC     Status: Abnormal   Collection Time: 07/03/16 12:05 PM  Result Value Ref Range   WBC 4.9 4.0 - 10.5 K/uL   RBC 4.57 3.87 - 5.11 MIL/uL   Hemoglobin 13.9 12.0 - 15.0 g/dL   HCT 42.3 36.0 - 46.0 %   MCV 92.6 78.0 - 100.0 fL   MCH 30.4 26.0 - 34.0 pg   MCHC 32.9 30.0 - 36.0 g/dL   RDW 15.5 11.5 - 15.5 %   Platelets 98 (L) 150 - 400 K/uL    Comment: PLATELET COUNT CONFIRMED BY SMEAR  Surgical pcr screen     Status: None   Collection Time: 07/03/16 12:05 PM  Result Value Ref Range   MRSA, PCR NEGATIVE NEGATIVE   Staphylococcus aureus NEGATIVE NEGATIVE    Comment:        The Xpert SA Assay (FDA approved for NASAL specimens in patients over 47 years of age), is one component of a comprehensive surveillance program.  Test performance has been validated by [facility] for patients greater than or equal to 38 year old. It is not intended to diagnose infection nor to guide or monitor treatment.   Glucose, capillary     Status: Abnormal   Collection Time: 07/03/16 12:17 PM  Result Value Ref Range   Glucose-Capillary 176 (H) 65 - 99 mg/dL   Comment 1 Notify RN    Comment 2 Document in Chart   hCG, serum, qualitative     Status:  None   Collection Time: 07/03/16  1:01 PM  Result Value Ref Range   Preg, Serum NEGATIVE NEGATIVE    Comment:        THE SENSITIVITY OF THIS METHODOLOGY IS >10 mIU/mL.   Comprehensive metabolic panel     Status: Abnormal   Collection Time: 07/03/16  1:01 PM  Result Value Ref Range   Sodium 134 (L) 135 - 145  mmol/L   Potassium 3.7 3.5 - 5.1 mmol/L   Chloride 103 101 - 111 mmol/L   CO2 25 22 - 32 mmol/L   Glucose, Bld 159 (H) 65 - 99 mg/dL   BUN 9 6 - 20 mg/dL   Creatinine, Ser 0.73 0.44 - 1.00 mg/dL   Calcium 9.1 8.9 - 10.3 mg/dL   Total Protein 7.2 6.5 - 8.1 g/dL   Albumin 3.0 (L) 3.5 - 5.0 g/dL   AST 50 (H) 15 - 41 U/L   ALT 44 14 - 54 U/L   Alkaline Phosphatase 124 38 - 126 U/L   Total Bilirubin 0.8 0.3 - 1.2 mg/dL   GFR calc non Af Amer >60 >60 mL/min   GFR calc Af Amer >60 >60 mL/min    Comment: (NOTE) The eGFR has been calculated using the CKD EPI equation. This calculation has not been validated in all clinical situations. eGFR's persistently <60 mL/min signify possible Chronic Kidney Disease.    Anion gap 6 5 - 15   No results found.  Review of Systems  All other systems reviewed and are negative.   Blood pressure 119/79, pulse 71, temperature 98.2 F (36.8 C), temperature source Oral, resp. rate 20, height 5' 5.5" (1.664 m), weight 86.2 kg (190 lb), last menstrual period 06/16/2016, SpO2 98 %. Physical Exam  Constitutional: She is oriented to person, place, and time. She appears well-developed and well-nourished.  HENT:  Head: Normocephalic and atraumatic.  Neck: Normal range of motion.  Cardiovascular: Normal rate.   Respiratory: Effort normal.  Musculoskeletal:       Right hand: She exhibits tenderness, laceration and swelling.  Chronic right long finger dorsal PIP laceration with loss of active extension and chronic drainage  Neurological: She is alert and oriented to person, place, and time.  Skin: Skin is warm. There is erythema.  Psychiatric: She  has a normal mood and affect. Her behavior is normal. Judgment and thought content normal.     Assessment/Plan As above. Plan on incision and drainage with possible tendon repair versus reconstruction and possible pinning of PIP joint.  Schuyler Amor, MD 07/03/2016, 1:58 PM

## 2016-07-03 NOTE — Op Note (Signed)
Please see dictated report [phone]

## 2016-07-03 NOTE — Transfer of Care (Signed)
Immediate Anesthesia Transfer of Care Note  Patient: Jamie Bell  Procedure(s) Performed: Procedure(s): INCISION AND DRAINAGE RIGHT LONG PROXIMAL INTERPHALANGEAL JOINT WOUND WITH  TENDON REPAIR AND CULTURES (Right)  Patient Location: PACU  Anesthesia Type:General  Level of Consciousness: awake, alert  and oriented  Airway & Oxygen Therapy: Patient Spontanous Breathing and Patient connected to nasal cannula oxygen  Post-op Assessment: Report given to RN and Post -op Vital signs reviewed and stable  Post vital signs: Reviewed and stable  Last Vitals:  Vitals:   07/03/16 1511 07/03/16 1515  BP:  (!) 159/135  Pulse: (!) 106 100  Resp: (!) 21 10  Temp: 36.1 C     Last Pain:  Vitals:   07/03/16 1511  TempSrc:   PainSc: 10-Worst pain ever      Patients Stated Pain Goal: 3 (28/20/81 3887)  Complications: No apparent anesthesia complications

## 2016-07-03 NOTE — Anesthesia Procedure Notes (Signed)
Procedure Name: LMA Insertion Date/Time: 07/03/2016 2:10 PM Performed by: Merrilyn Puma B Pre-anesthesia Checklist: Patient identified, Emergency Drugs available, Suction available, Patient being monitored and Timeout performed Patient Re-evaluated:Patient Re-evaluated prior to inductionOxygen Delivery Method: Circle system utilized Preoxygenation: Pre-oxygenation with 100% oxygen Intubation Type: IV induction LMA: LMA inserted LMA Size: 4.0 Number of attempts: 1 Placement Confirmation: positive ETCO2,  breath sounds checked- equal and bilateral and CO2 detector Tube secured with: Tape Dental Injury: Teeth and Oropharynx as per pre-operative assessment

## 2016-07-03 NOTE — Progress Notes (Signed)
Pts lab resullts given to Dr Burney Gauze

## 2016-07-04 ENCOUNTER — Encounter (HOSPITAL_COMMUNITY): Payer: Self-pay | Admitting: Orthopedic Surgery

## 2016-07-06 DIAGNOSIS — S61209D Unspecified open wound of unspecified finger without damage to nail, subsequent encounter: Secondary | ICD-10-CM | POA: Diagnosis not present

## 2016-07-06 DIAGNOSIS — M79644 Pain in right finger(s): Secondary | ICD-10-CM | POA: Diagnosis not present

## 2016-07-06 DIAGNOSIS — M7989 Other specified soft tissue disorders: Secondary | ICD-10-CM | POA: Diagnosis not present

## 2016-07-06 DIAGNOSIS — S66529D Laceration of intrinsic muscle, fascia and tendon of unspecified finger at wrist and hand level, subsequent encounter: Secondary | ICD-10-CM | POA: Diagnosis not present

## 2016-07-06 DIAGNOSIS — L03011 Cellulitis of right finger: Secondary | ICD-10-CM | POA: Diagnosis not present

## 2016-07-06 NOTE — Op Note (Signed)
NAME:  Jamie Bell, Jamie Bell                ACCOUNT NO.:  MEDICAL RECORD NO.:  [id number]  LOCATION:                                 FACILITY:  PHYSICIAN:  Sheral Apley. Burney Gauze, M.D.   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  PROCEDURE:  Incision and drainage of above with primary repair of central slip tendon laceration, right long finger.  SURGEON:  Sheral Apley. Burney Gauze, M.D.  ASSISTANT:  None.  ANESTHESIA:  General.  CULTURES:  Cultures x2 were sent, Gram stain.  COMPLICATIONS:  None.  DRAINS:  None.  DESCRIPTION OF PROCEDURE:  The patient was taken to the operating suite. After the induction of adequate general anesthetic, the right upper extremity was prepped and draped in a sterile fashion.  An Esmarch was used to exsanguinate the limb.  Tourniquet was then inflated to 250 mmHg.  At this point in time, an incision was made incorporating a chronic draining sinus from the PIP joint of the right long finger.  We extended it proximally ulnarly and distally radially.  Flaps were elevated.  Dissection was carried down.  There was a complete laceration of the extensor tendon over the proximal phalangeal joint.  We shotgunned opened the joint and removed devitalized tissue.  We irrigated with 500 mL of normal saline.  We cultured prior to doing all of this.  We then tried to remove as much chronically inflamed tissue off the extensor tendon proximally and distally as possible.  We then fixed this with a 3-0 FiberWire suture x4 horizontal mattress sutures.  We then closed the skin with 4-0 nylon.  Xeroform, 4x4s, and a volar splint were applied.  The patient tolerated this procedure well and went to  the recovery room in stable fashion.     Sheral Apley Burney Gauze, M.D.     MAW/MEDQ  D:  07/03/2016  T:  07/03/2016  Job:  [id number]

## 2016-07-08 LAB — AEROBIC/ANAEROBIC CULTURE W GRAM STAIN (SURGICAL/DEEP WOUND)

## 2016-08-06 DIAGNOSIS — S66522D Laceration of intrinsic muscle, fascia and tendon of right middle finger at wrist and hand level, subsequent encounter: Secondary | ICD-10-CM | POA: Diagnosis not present

## 2016-08-06 DIAGNOSIS — S61202D Unspecified open wound of right middle finger without damage to nail, subsequent encounter: Secondary | ICD-10-CM | POA: Diagnosis not present

## 2016-08-19 ENCOUNTER — Telehealth: Payer: Self-pay | Admitting: Gastroenterology

## 2016-08-19 DIAGNOSIS — K7460 Unspecified cirrhosis of liver: Secondary | ICD-10-CM | POA: Diagnosis not present

## 2016-08-19 DIAGNOSIS — I10 Essential (primary) hypertension: Secondary | ICD-10-CM | POA: Diagnosis not present

## 2016-08-19 DIAGNOSIS — L03011 Cellulitis of right finger: Secondary | ICD-10-CM | POA: Diagnosis not present

## 2016-08-19 DIAGNOSIS — J449 Chronic obstructive pulmonary disease, unspecified: Secondary | ICD-10-CM | POA: Diagnosis not present

## 2016-08-19 NOTE — Telephone Encounter (Signed)
Jamie Bell, can you review and advise in Leslie's absence?

## 2016-08-19 NOTE — Telephone Encounter (Signed)
[phone] please call patient regarding her hepatitis, she can not remember if she was treated and tested for all kinds but needs to find out because she is in a new relationship.

## 2016-08-19 NOTE — Telephone Encounter (Signed)
History of positive Hep C antibody, with negative RNA last year. She was to have a repeat RNA done, but I don't see this was completed. Was to be vaccinated for Hep B, as she is not immune. Immune to Hep A.

## 2016-08-20 ENCOUNTER — Other Ambulatory Visit: Payer: Self-pay

## 2016-08-20 DIAGNOSIS — Z8619 Personal history of other infectious and parasitic diseases: Secondary | ICD-10-CM

## 2016-08-20 NOTE — Telephone Encounter (Addendum)
Pt is aware  and she is going to have the blood work done in the morning. Order has been faxed to [facility]

## 2016-08-20 NOTE — Telephone Encounter (Signed)
Tried to call with no answer  

## 2016-08-20 NOTE — Telephone Encounter (Signed)
PT left VM for a return call. I called and could not leave a VM.

## 2016-08-21 DIAGNOSIS — Z8619 Personal history of other infectious and parasitic diseases: Secondary | ICD-10-CM | POA: Diagnosis not present

## 2016-08-22 LAB — HCV RNA QUANT: Hepatitis C Quantitation: NOT DETECTED IU/mL

## 2016-08-25 ENCOUNTER — Telehealth: Payer: Self-pay

## 2016-08-25 NOTE — Telephone Encounter (Signed)
Opened in error

## 2016-08-25 NOTE — Telephone Encounter (Signed)
Pt called office to get her blood work results from last week. Informed her that Hep C was not detected. She requests to have labs done to check for Hep A and B. States she had contact with someone in the past that had Hep B. She is in a new relationship and wants to make sure she is ok. Informed her that LSL is on vacation. Routing to AB for advice.

## 2016-08-26 ENCOUNTER — Other Ambulatory Visit: Payer: Self-pay

## 2016-08-26 DIAGNOSIS — Z8619 Personal history of other infectious and parasitic diseases: Secondary | ICD-10-CM

## 2016-08-26 NOTE — Telephone Encounter (Signed)
She does not need to be check for Hep A. She is immune to this.  She does not have Hep B as of Sept 2017. HOWEVER, she needs to have vaccination against Hep B as previously recommended. She has not had a change in her clinical status to warrant repeating a Hep B surface antigen. If she still desires this, you can order a repeat Hep B surface antigen per her request.

## 2016-08-26 NOTE — Telephone Encounter (Signed)
Called and informed pt. She said that she had contact with someone with Hep B since 09/2015 and wants to be checked again. She also wants a note saying she is immune to Hep A. She has rx for Hep B vaccine but didn't know if she should take it since she was exposed to someone.   Lab order entered for Hep B surface antigen per pt request and faxed to [facility]. Letter printed stating she is immune to Hep A and placed at front desk. She is aware.

## 2016-08-27 ENCOUNTER — Encounter: Payer: Self-pay | Admitting: Women's Health

## 2016-08-27 ENCOUNTER — Ambulatory Visit (INDEPENDENT_AMBULATORY_CARE_PROVIDER_SITE_OTHER): Payer: Medicare Other | Admitting: Women's Health

## 2016-08-27 ENCOUNTER — Other Ambulatory Visit (HOSPITAL_COMMUNITY)
Admission: RE | Admit: 2016-08-27 | Discharge: 2016-08-27 | Disposition: A | Discharge status: Home / Self Care | Payer: Medicare Other | Source: Ambulatory Visit | Attending: Obstetrics & Gynecology | Admitting: Obstetrics & Gynecology

## 2016-08-27 VITALS — BP 138/80 | HR 97 | Ht 65.0 in | Wt 185.5 lb

## 2016-08-27 DIAGNOSIS — Z113 Encounter for screening for infections with a predominantly sexual mode of transmission: Secondary | ICD-10-CM | POA: Diagnosis not present

## 2016-08-27 DIAGNOSIS — Z124 Encounter for screening for malignant neoplasm of cervix: Secondary | ICD-10-CM | POA: Diagnosis not present

## 2016-08-27 DIAGNOSIS — Z01419 Encounter for gynecological examination (general) (routine) without abnormal findings: Secondary | ICD-10-CM
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
## 2016-08-27 NOTE — Patient Instructions (Signed)
Mammogram yearly Physical with [place] yearly, pap smear every 3 years if this one is normal

## 2016-08-28 LAB — RPR: RPR Ser Ql: NONREACTIVE

## 2016-08-28 LAB — HIV ANTIBODY (ROUTINE TESTING W REFLEX): HIV Screen 4th Generation wRfx: NONREACTIVE

## 2016-08-28 LAB — HEPATITIS B SURFACE ANTIGEN: Hepatitis B Surface Ag: NEGATIVE

## 2016-08-29 NOTE — Telephone Encounter (Signed)
Reviewed chart no additional recommendations. She had Hep B surf antigen done by gyn last week.

## 2016-09-01 LAB — CYTOLOGY - PAP
CHLAMYDIA, DNA PROBE: NEGATIVE
Diagnosis: NEGATIVE
HPV (WINDOPATH): NOT DETECTED
NEISSERIA GONORRHEA: NEGATIVE

## 2016-09-04 ENCOUNTER — Telehealth: Payer: Self-pay | Admitting: Obstetrics & Gynecology

## 2016-09-04 ENCOUNTER — Telehealth: Payer: Self-pay | Admitting: Gastroenterology

## 2016-09-04 NOTE — Telephone Encounter (Signed)
[phone] CALL PATIENT ABOUT HER HEPATITIS VACCINES.  SHE GOT THEM AT HER GYNO AND WAS MAKING SURE WE GOT THE RESULTS

## 2016-09-04 NOTE — Telephone Encounter (Signed)
Spoke with pt letting her know all results were normal. Pt voiced understanding. [place]

## 2016-09-06 NOTE — Progress Notes (Signed)
Patient has negative HCV RNA and no evidence of Hep B.  She still need Hep B vaccine as previously advised.

## 2016-09-07 ENCOUNTER — Other Ambulatory Visit: Payer: Self-pay

## 2016-09-07 ENCOUNTER — Telehealth: Payer: Self-pay | Admitting: Gastroenterology

## 2016-09-07 DIAGNOSIS — K7460 Unspecified cirrhosis of liver: Secondary | ICD-10-CM

## 2016-09-07 NOTE — Telephone Encounter (Signed)
Pt is aware.  

## 2016-09-07 NOTE — Telephone Encounter (Signed)
Recall for ultrasound 

## 2016-09-07 NOTE — Telephone Encounter (Signed)
Pt is set up for 09/09/16 11:30 am. Letter in the mail

## 2016-09-08 NOTE — Telephone Encounter (Signed)
Tried to call, Vm not set up.

## 2016-09-08 NOTE — Progress Notes (Signed)
Tried to call. VM not set up.

## 2016-09-09 ENCOUNTER — Ambulatory Visit (HOSPITAL_COMMUNITY): Admission: RE | Admit: 2016-09-09 | Payer: Medicare Other | Source: Ambulatory Visit

## 2016-09-10 NOTE — Telephone Encounter (Signed)
I spoke to pt and she is aware I have not gotten any results.

## 2016-09-10 NOTE — Progress Notes (Signed)
I spoke to pt and she is aware of these results and recommendations. She said she missed her [place] and she will call Radiology and reschedule. She is aware of her appt on 09/16/2016.

## 2016-09-14 ENCOUNTER — Ambulatory Visit (HOSPITAL_COMMUNITY): Admission: RE | Admit: 2016-09-14 | Payer: Medicare Other | Source: Ambulatory Visit

## 2016-09-15 DIAGNOSIS — S61202S Unspecified open wound of right middle finger without damage to nail, sequela: Secondary | ICD-10-CM | POA: Diagnosis not present

## 2016-09-15 DIAGNOSIS — M79644 Pain in right finger(s): Secondary | ICD-10-CM | POA: Diagnosis not present

## 2016-09-15 DIAGNOSIS — S66522S Laceration of intrinsic muscle, fascia and tendon of right middle finger at wrist and hand level, sequela: Secondary | ICD-10-CM | POA: Diagnosis not present

## 2016-09-15 DIAGNOSIS — L03011 Cellulitis of right finger: Secondary | ICD-10-CM | POA: Diagnosis not present

## 2016-09-16 ENCOUNTER — Encounter: Payer: Self-pay | Admitting: Gastroenterology

## 2016-09-16 ENCOUNTER — Ambulatory Visit (INDEPENDENT_AMBULATORY_CARE_PROVIDER_SITE_OTHER): Payer: Medicare Other | Admitting: Gastroenterology

## 2016-09-16 VITALS — BP 144/86 | HR 104 | Temp 97.4°F | Ht 66.0 in | Wt 183.2 lb

## 2016-09-16 DIAGNOSIS — K7460 Unspecified cirrhosis of liver: Secondary | ICD-10-CM

## 2016-09-16 NOTE — Patient Instructions (Signed)
1. Please have your labs and ultrasound done.  2. Please have hepatitis B vaccines done, series of 3 shots.  3. Return to the office in six months.
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
## 2016-09-16 NOTE — Progress Notes (Signed)
cc'ed to pcp °

## 2016-09-16 NOTE — Assessment & Plan Note (Signed)
Likely secondary to [place]. Due for labs, hepatoma surveillance.  Prescription for hepatitis B vaccination provided. Patient encouraged to proceed with vaccination to protect her liver is much as possible.  Return to the office in 6 months for follow-up or call sooner if needed.

## 2016-09-22 ENCOUNTER — Ambulatory Visit (HOSPITAL_COMMUNITY): Admission: RE | Admit: 2016-09-22 | Payer: Medicare Other | Source: Ambulatory Visit

## 2016-11-12 DIAGNOSIS — Z79891 Long term (current) use of opiate analgesic: Secondary | ICD-10-CM | POA: Diagnosis not present

## 2017-02-08 DIAGNOSIS — J449 Chronic obstructive pulmonary disease, unspecified: Secondary | ICD-10-CM | POA: Diagnosis not present

## 2017-02-08 DIAGNOSIS — M545 Low back pain: Secondary | ICD-10-CM | POA: Diagnosis not present

## 2017-02-08 DIAGNOSIS — I10 Essential (primary) hypertension: Secondary | ICD-10-CM | POA: Diagnosis not present

## 2017-02-08 DIAGNOSIS — E119 Type 2 diabetes mellitus without complications: Secondary | ICD-10-CM | POA: Diagnosis not present

## 2017-03-22 ENCOUNTER — Ambulatory Visit (INDEPENDENT_AMBULATORY_CARE_PROVIDER_SITE_OTHER): Payer: Medicare Other | Admitting: Gastroenterology

## 2017-03-22 ENCOUNTER — Encounter: Payer: Self-pay | Admitting: *Deleted

## 2017-03-22 ENCOUNTER — Encounter: Payer: Self-pay | Admitting: Gastroenterology

## 2017-03-22 VITALS — BP 103/71 | HR 96 | Temp 97.0°F | Ht 66.0 in | Wt 181.6 lb

## 2017-03-22 DIAGNOSIS — K7460 Unspecified cirrhosis of liver: Secondary | ICD-10-CM

## 2017-03-22 NOTE — Progress Notes (Signed)
      Primary Care Physician: Sinda Du, MD  Primary Gastroenterologist:  Barney Drain, MD   Chief Complaint  Patient presents with  . Cirrhosis    HPI: Jamie Bell is a 47 y.o. female here for follow-up of cirrhosis. Last seen in August 2018. Felt to be due to [place].She had EGD/TCS 10/2015. No varices on upper endoscopy. She had duodenitis/gastritis, no H.pylori. Small internal hemorrhoids. Two polyps (TA) removed. Congestion and edema noted in left colon, bx most c/w NSAID related.   She is without any complaints. No edema. No confusion, itching. No bowel concerns. Denies abdominal pain. She is now off of oxycodone, on Suboxone. States she feels much better. Heartburn well controlled. Unfortunately she forgot to have her labs and ultrasound done as scheduled.   Current Outpatient Medications  Medication Sig Dispense Refill  . Aspirin-Salicylamide-Caffeine (BC HEADACHE PO) Take by mouth as needed.    . INVOKAMET [phone] MG TABS Take 1 tablet by mouth 2 (two) times daily.    Marland Kitchen OVER THE COUNTER MEDICATION OTC sleep aid as needed    . pantoprazole (PROTONIX) 40 MG tablet Take 1 tablet (40 mg total) by mouth 2 (two) times daily. 60 tablet 12  . pravastatin (PRAVACHOL) 20 MG tablet Take 20 mg by mouth daily.     Marland Kitchen SPIRIVA HANDIHALER 18 MCG inhalation capsule Place 18 mcg into inhaler and inhale daily as needed (sob).     . SUBOXONE 8-2 MG FILM      No current facility-administered medications for this visit.     Allergies as of 03/22/2017 - Review Complete 03/22/2017  Allergen Reaction Noted  . Ciprofloxacin Swelling 05/20/2005  . Nsaids Hives 05/02/2012    ROS:  General: Negative for anorexia, weight loss, fever, chills, fatigue, weakness. ENT: Negative for hoarseness, difficulty swallowing , nasal congestion. CV: Negative for chest pain, angina, palpitations, dyspnea on exertion, peripheral edema.  Respiratory: Negative for dyspnea at rest, dyspnea on exertion,  cough, sputum, wheezing.  GI: See history of present illness. GU:  Negative for dysuria, hematuria, urinary incontinence, urinary frequency, nocturnal urination.  Endo: Negative for unusual weight change.    Physical Examination:   BP 103/71   Pulse 96   Temp (!) 97 F (36.1 C) (Oral)   Ht 5' 6"  (1.676 m)   Wt 181 lb 9.6 oz (82.4 kg)   LMP 01/04/2017 (Approximate)   BMI 29.31 kg/m   General: Well-nourished, well-developed in no acute distress.  Eyes: No icterus. Mouth: Oropharyngeal mucosa moist and pink , no lesions erythema or exudate. Hirsutism Abdomen: Bowel sounds are normal, nontender, nondistended, no hepatosplenomegaly or masses, no abdominal bruits or hernia , no rebound or guarding.   Extremities: No lower extremity edema. No clubbing or deformities. Neuro: Alert and oriented x 4   Skin: Warm and dry, no jaundice.   Psych: Alert and cooperative, normal mood and affect.

## 2017-03-22 NOTE — Patient Instructions (Signed)
1. Please have your labs and ultrasound done.  2. Return to the office in six months.

## 2017-03-22 NOTE — Progress Notes (Signed)
cc'ed to pcp °

## 2017-03-22 NOTE — Assessment & Plan Note (Signed)
Suspected to be due to [place]. Patient overdue for labs and Hepatoma surveillance. She is willing to have those done at this point. She will return to the office and six months or call sooner if needed.

## 2017-03-26 ENCOUNTER — Ambulatory Visit (HOSPITAL_COMMUNITY): Admission: RE | Admit: 2017-03-26 | Payer: Medicare Other | Source: Ambulatory Visit

## 2017-03-27 ENCOUNTER — Emergency Department (HOSPITAL_COMMUNITY): Payer: Medicare Other

## 2017-03-27 ENCOUNTER — Emergency Department (HOSPITAL_COMMUNITY)
Admission: EM | Admit: 2017-03-27 | Discharge: 2017-03-27 | Disposition: A | Discharge status: Home / Self Care | Payer: Medicare Other | Attending: Emergency Medicine | Admitting: Emergency Medicine

## 2017-03-27 ENCOUNTER — Encounter (HOSPITAL_COMMUNITY): Payer: Self-pay | Admitting: *Deleted

## 2017-03-27 ENCOUNTER — Other Ambulatory Visit: Payer: Self-pay

## 2017-03-27 DIAGNOSIS — R197 Diarrhea, unspecified: Secondary | ICD-10-CM | POA: Diagnosis not present

## 2017-03-27 DIAGNOSIS — I10 Essential (primary) hypertension: Secondary | ICD-10-CM | POA: Insufficient documentation

## 2017-03-27 DIAGNOSIS — K922 Gastrointestinal hemorrhage, unspecified: Secondary | ICD-10-CM | POA: Insufficient documentation

## 2017-03-27 DIAGNOSIS — E119 Type 2 diabetes mellitus without complications: Secondary | ICD-10-CM | POA: Insufficient documentation

## 2017-03-27 DIAGNOSIS — F17210 Nicotine dependence, cigarettes, uncomplicated: Secondary | ICD-10-CM | POA: Insufficient documentation

## 2017-03-27 DIAGNOSIS — A09 Infectious gastroenteritis and colitis, unspecified: Secondary | ICD-10-CM

## 2017-03-27 DIAGNOSIS — Z9049 Acquired absence of other specified parts of digestive tract: Secondary | ICD-10-CM | POA: Insufficient documentation

## 2017-03-27 DIAGNOSIS — R1011 Right upper quadrant pain: Secondary | ICD-10-CM | POA: Diagnosis present

## 2017-03-27 DIAGNOSIS — F329 Major depressive disorder, single episode, unspecified: Secondary | ICD-10-CM | POA: Diagnosis not present

## 2017-03-27 DIAGNOSIS — Z79899 Other long term (current) drug therapy: Secondary | ICD-10-CM | POA: Diagnosis not present

## 2017-03-27 DIAGNOSIS — R1110 Vomiting, unspecified: Secondary | ICD-10-CM | POA: Diagnosis not present

## 2017-03-27 DIAGNOSIS — R109 Unspecified abdominal pain: Secondary | ICD-10-CM | POA: Diagnosis not present

## 2017-03-27 LAB — COMPREHENSIVE METABOLIC PANEL
ALBUMIN: 3.8 g/dL (ref 3.5–5.0)
ALK PHOS: 119 U/L (ref 38–126)
ALT: 33 U/L (ref 14–54)
ANION GAP: 11 (ref 5–15)
AST: 40 U/L (ref 15–41)
BILIRUBIN TOTAL: 0.6 mg/dL (ref 0.3–1.2)
BUN: 20 mg/dL (ref 6–20)
CO2: 22 mmol/L (ref 22–32)
CREATININE: 1.08 mg/dL — AB (ref 0.44–1.00)
Calcium: 9.6 mg/dL (ref 8.9–10.3)
Chloride: 105 mmol/L (ref 101–111)
GFR calc non Af Amer: >60 mL/min (ref >60)
GLUCOSE: 146 mg/dL — AB (ref 65–99)
Potassium: 4 mmol/L (ref 3.5–5.1)
Sodium: 138 mmol/L (ref 135–145)
TOTAL PROTEIN: 8.5 g/dL — AB (ref 6.5–8.1)

## 2017-03-27 LAB — CBC WITH DIFFERENTIAL/PLATELET
Basophils Absolute: 0 10*3/uL (ref 0.0–0.1)
Basophils Relative: 0 %
Eosinophils Absolute: 0.1 10*3/uL (ref 0.0–0.7)
Eosinophils Relative: 1 %
HEMATOCRIT: 48.1 % — AB (ref 36.0–46.0)
HEMOGLOBIN: 16.2 g/dL — AB (ref 12.0–15.0)
LYMPHS ABS: 1.6 10*3/uL (ref 0.7–4.0)
Lymphocytes Relative: 14 %
MCH: 31.9 pg (ref 26.0–34.0)
MCHC: 33.7 g/dL (ref 30.0–36.0)
MCV: 94.7 fL (ref 78.0–100.0)
MONOS PCT: 6 %
Monocytes Absolute: 0.7 10*3/uL (ref 0.1–1.0)
NEUTROS ABS: 8.9 10*3/uL — AB (ref 1.7–7.7)
NEUTROS PCT: 79 %
Platelets: 162 10*3/uL (ref 150–400)
RBC: 5.08 MIL/uL (ref 3.87–5.11)
RDW: 13 % (ref 11.5–15.5)
WBC: 11.3 10*3/uL — ABNORMAL HIGH (ref 4.0–10.5)

## 2017-03-27 LAB — URINALYSIS, ROUTINE W REFLEX MICROSCOPIC
BILIRUBIN URINE: NEGATIVE
Bacteria, UA: NONE SEEN
GLUCOSE, UA: NEGATIVE mg/dL
HGB URINE DIPSTICK: NEGATIVE
KETONES UR: NEGATIVE mg/dL
LEUKOCYTES UA: NEGATIVE
NITRITE: NEGATIVE
PROTEIN: 100 mg/dL — AB
Specific Gravity, Urine: 1.03 (ref 1.005–1.030)
pH: 5 (ref 5.0–8.0)

## 2017-03-27 LAB — PROTIME-INR
INR: 1.15
Prothrombin Time: 14.6 seconds (ref 11.4–15.2)

## 2017-03-27 LAB — POC OCCULT BLOOD, ED: Fecal Occult Bld: POSITIVE — AB

## 2017-03-27 LAB — APTT: aPTT: 30 seconds (ref 24–36)

## 2017-03-27 LAB — LIPASE, BLOOD: Lipase: 34 U/L (ref 11–51)

## 2017-03-27 LAB — PREGNANCY, URINE: Preg Test, Ur: NEGATIVE

## 2017-03-27 MED ORDER — ONDANSETRON 4 MG PO TBDP: ORAL_TABLET | ORAL | 0 refills | Status: DC

## 2017-03-27 MED ORDER — AZITHROMYCIN 250 MG PO TABS
500.0000 mg | ORAL_TABLET | Freq: Once | ORAL | Status: AC
  Administered 2017-03-27: 500 mg via ORAL
  Filled 2017-03-27: qty 2

## 2017-03-27 MED ORDER — DICYCLOMINE HCL 20 MG PO TABS: 20.0000 mg | ORAL_TABLET | Freq: Two times a day (BID) | ORAL | 0 refills | Status: DC | PRN

## 2017-03-27 MED ORDER — AZITHROMYCIN 250 MG PO TABS: 500.0000 mg | ORAL_TABLET | Freq: Every day | ORAL | 0 refills | Status: DC

## 2017-03-27 MED ORDER — ONDANSETRON HCL 4 MG/2ML IJ SOLN
4.0000 mg | Freq: Once | INTRAMUSCULAR | Status: AC
  Administered 2017-03-27: 4 mg via INTRAVENOUS
  Filled 2017-03-27: qty 2

## 2017-03-27 MED ORDER — SODIUM CHLORIDE 0.9 % IV BOLUS (SEPSIS)
1000.0000 mL | Freq: Once | INTRAVENOUS | Status: AC
  Administered 2017-03-27: 1000 mL via INTRAVENOUS

## 2017-03-27 MED ORDER — IOPAMIDOL (ISOVUE-300) INJECTION 61%
100.0000 mL | Freq: Once | INTRAVENOUS | Status: AC | PRN
  Administered 2017-03-27: 100 mL via INTRAVENOUS

## 2017-03-27 MED ORDER — MORPHINE SULFATE (PF) 4 MG/ML IV SOLN
4.0000 mg | Freq: Once | INTRAVENOUS | Status: AC
  Administered 2017-03-27: 4 mg via INTRAVENOUS
  Filled 2017-03-27: qty 1

## 2017-03-27 NOTE — ED Triage Notes (Signed)
Abdominal pain with vomiting, c/o bloody stools

## 2017-03-27 NOTE — ED Provider Notes (Signed)
[facility] EMERGENCY DEPARTMENT Provider Note   CSN: [id number] Arrival date & time: 03/27/17  1505     History   Chief Complaint Chief Complaint  Patient presents with  . Abdominal Pain    HPI Jamie Bell is a 47 y.o. female.  HPI Patient states she developed right upper quadrant pain yesterday morning after eating a ham biscuit.  The pain is been constant.  She has had multiple episodes of grossly bloody stool.  Endorses nausea without vomiting.  No fever or chills.  No known sick contacts. Past Medical History:  Diagnosis Date  . Chronic back pain   . Cirrhosis ([place])   . Constipation   . DDD (degenerative disc disease)   . Decreased hearing on the right.   F/u by ENT in the past and no further management.  . Depression   . Diabetes mellitus   . Dyspnea    with exertion  . GERD (gastroesophageal reflux disease)   . Hypertension    took med. for HTN, several yrs. ago, no longer needs , seen by Dr. Lattie Haw last yr. during a hosp. at [facility], for MRSA bacteremia   . MRSA bacteremia 2014   Tx with Vancomycin  . NASH (nonalcoholic steatohepatitis)   . PCOS (polycystic ovarian syndrome)   . Pneumonia 2016  . Polysubstance abuse ([place])   . Sciatica    HNP- Cerv. 7- T2    Patient Active Problem List   Diagnosis Date Noted  . Rectal bleeding 10/31/2015  . Acute non-recurrent maxillary sinusitis 10/31/2015  . Constipation   . Ileus ([place]) 09/29/2015  . Generalized abdominal pain 09/29/2015  . Diarrhea 09/29/2015  . Cirrhosis ([place]) 09/29/2015  . Demand ischemia of myocardium ([place])   . Severe sepsis ([place])   . Sepsis due to pneumonia ([employer])   . Type 2 diabetes mellitus with diabetic chronic kidney disease ([place])   . Anxiety state   . Depression   . Chronic back pain   . Cellulitis of right hand   . Spinal stenosis in cervical region 11/22/2013  . Chest wall pain 08/04/2012  . H/O opioid abuse 08/04/2012  . Opiate dependence ([place]) 06/04/2011  . TOBACCO USER  11/03/2008  . OPIOID WITHDRAWAL 10/25/2008  . Backache 10/25/2008  . HYPERTENSION, BENIGN 06/10/2006  . DM (diabetes mellitus), type 2 ([place]) 04/06/2006  . POLYCYSTIC OVARIAN DISEASE 04/06/2006  . BACK PAIN, LUMBAR, CHRONIC 04/06/2006  . ABNORMAL FINDINGS, ELEVATED BP W/O HTN 04/06/2006    Past Surgical History:  Procedure Laterality Date  . BACK SURGERY    . BREAST SURGERY Left    blocked milk ducts   . CARPAL TUNNEL RELEASE    . CATARACT EXTRACTION W/PHACO Left 02/04/2015   Procedure: CATARACT EXTRACTION PHACO AND INTRAOCULAR LENS PLACEMENT (IOC);  Surgeon: [name], MD;  Location: [facility];  Service: Ophthalmology;  Laterality: Left;  CDE: 4.11  . CHOLECYSTECTOMY    . COLONOSCOPY WITH PROPOFOL N/A 11/12/2015   Procedure: COLONOSCOPY WITH PROPOFOL;  Surgeon: [name], MD;  Location: [facility];  Service: Endoscopy;  Laterality: N/A;  10:15 am  . ESOPHAGEAL BANDING N/A 11/12/2015   Procedure: ESOPHAGEAL BANDING;  Surgeon: [name], MD;  Location: [facility];  Service: Endoscopy;  Laterality: N/A;  . ESOPHAGOGASTRODUODENOSCOPY  2007   Dr. Oneida Alar: mild antral erythema. Future procedures need to be done with Propofol per notes.   . ESOPHAGOGASTRODUODENOSCOPY (EGD) WITH PROPOFOL N/A 11/12/2015   Procedure: ESOPHAGOGASTRODUODENOSCOPY (EGD) WITH PROPOFOL;  Surgeon: [name], MD;  [facility];  Service: Endoscopy;  Laterality: N/A;  . INCISION AND DRAINAGE WOUND WITH TENDON REPAIR Right 07/03/2016   Procedure: INCISION AND DRAINAGE RIGHT LONG PROXIMAL INTERPHALANGEAL JOINT WOUND WITH  TENDON REPAIR AND CULTURES;  Surgeon: [name], MD;  Location: [place];  Service: Orthopedics;  Laterality: Right;  . NECK SURGERY    . PERIPHERALLY INSERTED CENTRAL CATHETER INSERTION Right 2007   for treatment with Vancomycin   . POLYPECTOMY  11/12/2015   Procedure: POLYPECTOMY;  Surgeon: [name], MD;  [facility];  Service: Endoscopy;;  transverse  colon polyp  . POSTERIOR CERVICAL LAMINECTOMY N/A 11/22/2013   Procedure: POSTERIOR CERVICAL LAMINECTOMY C7/T1, T/1-2;  Surgeon: [name], MD;  Location: [place] [facility];  Service: Neurosurgery;  Laterality: N/A;  POSTERIOR CERVICAL LAMINECTOMY C7/T1, T/1-2  . TEE WITHOUT CARDIOVERSION N/A 08/09/2012   Procedure: TRANSESOPHAGEAL ECHOCARDIOGRAM (TEE);  Surgeon: [name], MD;  [facility];  Service: Cardiovascular;  Laterality: N/A;    OB History    Gravida Para Term Preterm AB Living             0   SAB TAB Ectopic Multiple Live Births                   Home Medications    Prior to Admission medications   Medication Sig Start Date End Date Taking? Authorizing Provider  Aspirin-Salicylamide-Caffeine (BC HEADACHE PO) Take 1 Package by mouth daily as needed.    Yes [provider]  bromocriptine (PARLODEL) 2.5 MG tablet Take 1 tablet by mouth daily. 03/03/17  Yes [provider]  cloNIDine (CATAPRES) 0.1 MG tablet Take 1 tablet by mouth daily. 02/24/17  Yes [provider]  fluticasone furoate-vilanterol (BREO ELLIPTA) 200-25 MCG/INH AEPB Inhale 1 puff into the lungs daily.   Yes [provider]  INVOKAMET [phone] MG TABS Take 1 tablet by mouth 2 (two) times daily. 09/03/15  Yes [provider]  pantoprazole (PROTONIX) 40 MG tablet Take 1 tablet (40 mg total) by mouth 2 (two) times daily. 10/02/15  Yes Sinda Du, MD  pravastatin (PRAVACHOL) 20 MG tablet Take 20 mg by mouth daily.  12/31/14  Yes [provider]  QUEtiapine (SEROQUEL) 25 MG tablet Take 1 tablet by mouth daily. 03/10/17  Yes [provider]  azithromycin (ZITHROMAX) 250 MG tablet Take 2 tablets (500 mg total) by mouth daily. 03/28/17   Julianne Rice, MD  dicyclomine (BENTYL) 20 MG tablet Take 1 tablet (20 mg total) by mouth 2 (two) times daily as needed for spasms. 03/27/17   Julianne Rice, MD  disulfiram (ANTABUSE) 250 MG tablet Take 1 tablet  by mouth daily. 02/11/17   [provider]  SPIRIVA HANDIHALER 18 MCG inhalation capsule Place 18 mcg into inhaler and inhale daily as needed (sob).  12/31/14   [provider]    Family History Family History  Problem Relation Age of Onset  . Hypertension Mother   . Hypertension Father   . Cirrhosis Father        Drank some alcohol  . Diabetes Brother   . Diabetes Other   . Cirrhosis Paternal Aunt        No alcohol  . Cirrhosis Paternal Aunt        No alcohol  . Colon cancer Neg Hx     Social History Social History   Tobacco Use  . Smoking status: Current  Every Day Smoker    Packs/day: 1.00    Years: 20.00    Pack years: 20.00    Types: Cigarettes  . Smokeless tobacco: Never Used  Substance Use Topics  . Alcohol use: Yes    Comment: rare  . Drug use: Yes    Types: IV, Marijuana    Comment: prior history of cocaine use, none in 15 years      Allergies   Ciprofloxacin; Nsaids; and Tylenol [acetaminophen]   Review of Systems Review of Systems  Constitutional: Negative for chills and fever.  Respiratory: Negative for cough and shortness of breath.   Cardiovascular: Negative for chest pain.  Gastrointestinal: Positive for abdominal pain, blood in stool, diarrhea and nausea. Negative for constipation and vomiting.  Genitourinary: Negative for dysuria, flank pain, frequency and hematuria.  Musculoskeletal: Negative for back pain, myalgias, neck pain and neck stiffness.  Skin: Negative for rash and wound.  Neurological: Negative for dizziness, weakness, light-headedness, numbness and headaches.  All other systems reviewed and are negative.    Physical Exam Updated Vital Signs BP (!) 127/92   Pulse 95   Temp 99.1 F (37.3 C) (Oral)   Resp 20   Ht 5' 5"  (1.651 m)   Wt 79.8 kg (176 lb)   SpO2 100%   BMI 29.29 kg/m   Physical Exam  Constitutional: She is oriented to person, place, and time. She appears well-developed and well-nourished.   Non-toxic appearance. She does not appear ill. No distress.  HENT:  Head: Normocephalic and atraumatic.  Mouth/Throat: Oropharynx is clear and moist.  Eyes: EOM are normal. Pupils are equal, round, and reactive to light.  Neck: Normal range of motion. Neck supple.  Cardiovascular: Normal rate and regular rhythm. Exam reveals no gallop and no friction rub.  No murmur heard. Pulmonary/Chest: Effort normal and breath sounds normal. No stridor. No respiratory distress. She has no wheezes. She has no rales. She exhibits no tenderness.  Abdominal: Soft. Bowel sounds are normal. There is tenderness. There is no rebound and no guarding.  Left upper quadrant, epigastric, right upper quadrant and right lower quadrant tenderness to palpation.  No rebound or guarding.  Musculoskeletal: Normal range of motion. She exhibits no edema or tenderness.  No CVA tenderness bilaterally.  Neurological: She is alert and oriented to person, place, and time.  Moves all extremities without focal deficit.  Sensation intact.  Skin: Skin is warm and dry. Capillary refill takes less than 2 seconds. No rash noted. She is not diaphoretic. No erythema.  Psychiatric: She has a normal mood and affect. Her behavior is normal.  Nursing note and vitals reviewed.    ED Treatments / Results  Labs (all labs ordered are listed, but only abnormal results are displayed) Labs Reviewed  CBC WITH DIFFERENTIAL/PLATELET - Abnormal; Notable for the following components:      Result Value   WBC 11.3 (*)    Hemoglobin 16.2 (*)    HCT 48.1 (*)    Neutro Abs 8.9 (*)    All other components within normal limits  COMPREHENSIVE METABOLIC PANEL - Abnormal; Notable for the following components:   Glucose, Bld 146 (*)    Creatinine, Ser 1.08 (*)    Total Protein 8.5 (*)    All other components within normal limits  URINALYSIS, ROUTINE W REFLEX MICROSCOPIC - Abnormal; Notable for the following components:   Color, Urine AMBER (*)     APPearance HAZY (*)    Protein, ur 100 (*)  Squamous Epithelial / LPF 6-30 (*)    All other components within normal limits  POC OCCULT BLOOD, ED - Abnormal; Notable for the following components:   Fecal Occult Bld POSITIVE (*)    All other components within normal limits  LIPASE, BLOOD  PROTIME-INR  APTT  PREGNANCY, URINE    EKG  EKG Interpretation None       Radiology Ct Abdomen Pelvis W Contrast  Result Date: 03/27/2017 CLINICAL DATA:  Abdominal pain, vomiting and blood in stool today. Elevated white blood cell count. EXAM: CT ABDOMEN AND PELVIS WITH CONTRAST TECHNIQUE: Multidetector CT imaging of the abdomen and pelvis was performed using the standard protocol following bolus administration of intravenous contrast. CONTRAST:  100 ml ISOVUE-300 IOPAMIDOL (ISOVUE-300) INJECTION 61% COMPARISON:  CT abdomen and pelvis 09/29/2015 FINDINGS: Lower chest: Lung bases are clear. No pleural or pericardial effusion. Hepatobiliary: Nodular liver border consistent with cirrhosis is again seen. No focal liver lesion. The patient is status post cholecystectomy. There is no intrahepatic biliary ductal dilatation but the common bile duct measures 1 cm in diameter compared to 0.8 cm on the prior exam. Pancreas: Unremarkable. No pancreatic ductal dilatation or surrounding inflammatory changes. Spleen: No focal lesion. The spleen measures 14.2 cm in length compared to 16.7 cm on the prior study. Adrenals/Urinary Tract: Adrenal glands are unremarkable. Kidneys are normal, without renal calculi, focal lesion, or hydronephrosis. Bladder is unremarkable. Stomach/Bowel: Stomach is within normal limits. Appendix appears normal. No evidence of bowel wall thickening, distention, or inflammatory changes. Vascular/Lymphatic: No significant vascular findings are present. No enlarged abdominal or pelvic lymph nodes. Reproductive: Left ovarian cyst measuring 2.6 x 3.9 cm is identified. Uterus and right ovary are  unremarkable. Other: No ascites.  No free intraperitoneal air. Musculoskeletal: Multilevel thoracic and lumbar spondylosis is seen. There is convex right lumbar scoliosis. No lytic or sclerotic lesion. IMPRESSION: No acute abnormality or finding to explain the patient's symptoms or elevated white blood cell count. Cirrhotic liver. Mild prominence of the common bile duct is increased since the prior study and may be related to prior cholecystectomy. No obstructing lesion is identified. Splenomegaly seen on the prior CT scan is improved with the spleen measuring 14.2 cm today compared to 16.7 cm on the prior exam. 3.9 cm left ovarian cyst. Follow-up ultrasound in 6-12 weeks is recommended. This recommendation follows ACR consensus guidelines: White Paper of the ACR Incidental Findings Committee II on Adnexal Findings. J Am Coll Radiol [phone]. Atherosclerosis. Multilevel thoracic and lumbar spondylosis. Convex right lumbar scoliosis. Electronically Signed   By: Inge Rise M.D.   On: 03/27/2017 19:37    Procedures Procedures (including critical care time)  Medications Ordered in ED Medications  morphine 4 MG/ML injection 4 mg (not administered)  azithromycin (ZITHROMAX) tablet 500 mg (not administered)  morphine 4 MG/ML injection 4 mg (4 mg Intravenous Given 03/27/17 1709)  ondansetron (ZOFRAN) injection 4 mg (4 mg Intravenous Given 03/27/17 1708)  sodium chloride 0.9 % bolus 1,000 mL (0 mLs Intravenous Stopped 03/27/17 1806)  iopamidol (ISOVUE-300) 61 % injection 100 mL (100 mLs Intravenous Contrast Given 03/27/17 1844)     Initial Impression / Assessment and Plan / ED Course  I have reviewed the triage vital signs and the nursing notes.  Pertinent labs & imaging results that were available during my care of the patient were reviewed by me and considered in my medical decision making (see chart for details).    Patient is hemodynamically stable.  Stable hemoglobin.  Patient  has evidence of  cirrhosis and mild splenomegaly which is consistent with prior imaging.  She does have a left ovarian cyst.  Low suspicion for torsion.  She has had upper endoscopy and colonoscopy relatively recently.  No evidence of varices.  She does have a history of internal hemorrhoids.  Bloody stools may be related to internal hemorrhoidal bleeding versus hemorrhagic enteritis.  Reevaluation of patient.  She is comfortable appearing.  Abdominal exam is benign.  Have instructed patient to call and make appointment with her gastroenterologist for close follow-up.  Given concern for bacterial enteritis will start on azithromycin. strict return precautions have been given.  Final Clinical Impressions(s) / ED Diagnoses   Final diagnoses:  Infectious diarrhea in adult patient  Lower gastrointestinal bleed    ED Discharge Orders        Ordered    azithromycin (ZITHROMAX) 250 MG tablet  Daily     03/27/17 2038    dicyclomine (BENTYL) 20 MG tablet  2 times daily PRN     03/27/17 2038       Julianne Rice, MD 03/27/17 2039

## 2017-05-19 ENCOUNTER — Emergency Department (HOSPITAL_COMMUNITY): Payer: Medicare Other

## 2017-05-19 ENCOUNTER — Encounter (HOSPITAL_COMMUNITY): Payer: Self-pay | Admitting: Emergency Medicine

## 2017-05-19 ENCOUNTER — Other Ambulatory Visit: Payer: Self-pay

## 2017-05-19 ENCOUNTER — Emergency Department (HOSPITAL_COMMUNITY)
Admission: EM | Admit: 2017-05-19 | Discharge: 2017-05-19 | Disposition: A | Discharge status: Home / Self Care | Payer: Medicare Other | Attending: Emergency Medicine | Admitting: Emergency Medicine

## 2017-05-19 DIAGNOSIS — J040 Acute laryngitis: Secondary | ICD-10-CM | POA: Diagnosis not present

## 2017-05-19 DIAGNOSIS — F1120 Opioid dependence, uncomplicated: Secondary | ICD-10-CM | POA: Insufficient documentation

## 2017-05-19 DIAGNOSIS — Z79899 Other long term (current) drug therapy: Secondary | ICD-10-CM | POA: Insufficient documentation

## 2017-05-19 DIAGNOSIS — R05 Cough: Secondary | ICD-10-CM | POA: Diagnosis not present

## 2017-05-19 DIAGNOSIS — Z9049 Acquired absence of other specified parts of digestive tract: Secondary | ICD-10-CM | POA: Insufficient documentation

## 2017-05-19 DIAGNOSIS — B349 Viral infection, unspecified: Secondary | ICD-10-CM | POA: Insufficient documentation

## 2017-05-19 DIAGNOSIS — M7918 Myalgia, other site: Secondary | ICD-10-CM | POA: Diagnosis not present

## 2017-05-19 DIAGNOSIS — F17210 Nicotine dependence, cigarettes, uncomplicated: Secondary | ICD-10-CM | POA: Diagnosis not present

## 2017-05-19 DIAGNOSIS — E1122 Type 2 diabetes mellitus with diabetic chronic kidney disease: Secondary | ICD-10-CM | POA: Insufficient documentation

## 2017-05-19 DIAGNOSIS — R0602 Shortness of breath: Secondary | ICD-10-CM | POA: Diagnosis not present

## 2017-05-19 DIAGNOSIS — F329 Major depressive disorder, single episode, unspecified: Secondary | ICD-10-CM | POA: Diagnosis not present

## 2017-05-19 DIAGNOSIS — F419 Anxiety disorder, unspecified: Secondary | ICD-10-CM | POA: Diagnosis not present

## 2017-05-19 DIAGNOSIS — N189 Chronic kidney disease, unspecified: Secondary | ICD-10-CM | POA: Diagnosis not present

## 2017-05-19 DIAGNOSIS — M791 Myalgia, unspecified site: Secondary | ICD-10-CM

## 2017-05-19 DIAGNOSIS — M542 Cervicalgia: Secondary | ICD-10-CM | POA: Diagnosis not present

## 2017-05-19 DIAGNOSIS — I129 Hypertensive chronic kidney disease with stage 1 through stage 4 chronic kidney disease, or unspecified chronic kidney disease: Secondary | ICD-10-CM | POA: Diagnosis not present

## 2017-05-19 DIAGNOSIS — R079 Chest pain, unspecified: Secondary | ICD-10-CM | POA: Diagnosis not present

## 2017-05-19 LAB — CBC WITH DIFFERENTIAL/PLATELET
Basophils Absolute: 0 10*3/uL (ref 0.0–0.1)
Basophils Relative: 0 %
Eosinophils Absolute: 0.1 10*3/uL (ref 0.0–0.7)
Eosinophils Relative: 1 %
HEMATOCRIT: 39.1 % (ref 36.0–46.0)
HEMOGLOBIN: 13.2 g/dL (ref 12.0–15.0)
LYMPHS ABS: 0.8 10*3/uL (ref 0.7–4.0)
Lymphocytes Relative: 11 %
MCH: 31.7 pg (ref 26.0–34.0)
MCHC: 33.8 g/dL (ref 30.0–36.0)
MCV: 94 fL (ref 78.0–100.0)
MONO ABS: 0.4 10*3/uL (ref 0.1–1.0)
Monocytes Relative: 6 %
NEUTROS ABS: 6.1 10*3/uL (ref 1.7–7.7)
NEUTROS PCT: 82 %
Platelets: 114 10*3/uL — ABNORMAL LOW (ref 150–400)
RBC: 4.16 MIL/uL (ref 3.87–5.11)
RDW: 13.6 % (ref 11.5–15.5)
WBC: 7.4 10*3/uL (ref 4.0–10.5)

## 2017-05-19 LAB — URINALYSIS, ROUTINE W REFLEX MICROSCOPIC
BILIRUBIN URINE: NEGATIVE
Bacteria, UA: NONE SEEN
Ketones, ur: NEGATIVE mg/dL
LEUKOCYTES UA: NEGATIVE
NITRITE: NEGATIVE
PH: 6 (ref 5.0–8.0)
Protein, ur: NEGATIVE mg/dL
SPECIFIC GRAVITY, URINE: 1.017 (ref 1.005–1.030)

## 2017-05-19 LAB — COMPREHENSIVE METABOLIC PANEL
ALK PHOS: 115 U/L (ref 38–126)
ALT: 28 U/L (ref 14–54)
ANION GAP: 8 (ref 5–15)
AST: 25 U/L (ref 15–41)
Albumin: 3.4 g/dL — ABNORMAL LOW (ref 3.5–5.0)
BILIRUBIN TOTAL: 0.9 mg/dL (ref 0.3–1.2)
BUN: 11 mg/dL (ref 6–20)
CALCIUM: 8.8 mg/dL — AB (ref 8.9–10.3)
CO2: 25 mmol/L (ref 22–32)
Chloride: 96 mmol/L — ABNORMAL LOW (ref 101–111)
Creatinine, Ser: 0.71 mg/dL (ref 0.44–1.00)
Glucose, Bld: 305 mg/dL — ABNORMAL HIGH (ref 65–99)
POTASSIUM: 3.7 mmol/L (ref 3.5–5.1)
Sodium: 129 mmol/L — ABNORMAL LOW (ref 135–145)
TOTAL PROTEIN: 7.8 g/dL (ref 6.5–8.1)

## 2017-05-19 LAB — LIPASE, BLOOD: Lipase: 30 U/L (ref 11–51)

## 2017-05-19 MED ORDER — ASPIRIN 325 MG PO TABS
650.0000 mg | ORAL_TABLET | Freq: Once | ORAL | Status: AC
  Administered 2017-05-19: 650 mg via ORAL
  Filled 2017-05-19: qty 2

## 2017-05-19 MED ORDER — PREDNISONE 10 MG PO TABS: 10.0000 mg | ORAL_TABLET | Freq: Every day | ORAL | 0 refills | Status: DC

## 2017-05-19 MED ORDER — AMOXICILLIN 500 MG PO CAPS: 500.0000 mg | ORAL_CAPSULE | Freq: Three times a day (TID) | ORAL | 0 refills | Status: DC

## 2017-05-19 MED ORDER — SODIUM CHLORIDE 0.9 % IV BOLUS
1000.0000 mL | Freq: Once | INTRAVENOUS | Status: AC
  Administered 2017-05-19: 1000 mL via INTRAVENOUS

## 2017-05-19 MED ORDER — CYCLOBENZAPRINE HCL 10 MG PO TABS: 10.0000 mg | ORAL_TABLET | Freq: Two times a day (BID) | ORAL | 0 refills | Status: DC | PRN

## 2017-05-19 NOTE — ED Triage Notes (Signed)
Pt c/o of neck pain, cough, sore throat and congestion since Sunday. Denies fever.

## 2017-05-19 NOTE — ED Notes (Signed)
Patient transported to X-ray 

## 2017-05-19 NOTE — Discharge Instructions (Signed)
Increase fluids, gargle with salt water, aspirin for pain.  Prescription for muscle relaxer, prednisone, antibiotic.  Return if neck stiffness worsens or you have confusion or worsening pain.

## 2017-05-20 NOTE — ED Provider Notes (Signed)
[facility] EMERGENCY DEPARTMENT Provider Note   CSN: [id number] Arrival date & time: 05/19/17  1529     History   Chief Complaint Chief Complaint  Patient presents with  . Neck Pain  . Weakness    HPI Jamie Bell is a 47 y.o. female.  Patient presents with bilateral shoulder pain radiating to the lateral neck area since Sunday which she attributes to using the weedeater excessively and then a slip and fall.  No meningeal signs, fever, sweats, chills.  Additionally she has a sore throat and has "lost her voice".  Past medical history includes diabetes, chronic pain, depression, MRSA bacteremia, polysubstance abuse, hypertension, fatty liver, many others.  She is currently not using any narcotics.     Past Medical History:  Diagnosis Date  . Chronic back pain   . Cirrhosis ([place])   . Constipation   . DDD (degenerative disc disease)   . Decreased hearing on the right.   F/u by ENT in the past and no further management.  . Depression   . Diabetes mellitus   . Dyspnea    with exertion  . GERD (gastroesophageal reflux disease)   . Hypertension    took med. for HTN, several yrs. ago, no longer needs , seen by Dr. Lattie Haw last yr. during a hosp. at [facility], for MRSA bacteremia   . MRSA bacteremia 2014   Tx with Vancomycin  . NASH (nonalcoholic steatohepatitis)   . PCOS (polycystic ovarian syndrome)   . Pneumonia 2016  . Polysubstance abuse ([place])   . Sciatica    HNP- Cerv. 7- T2    Patient Active Problem List   Diagnosis Date Noted  . Rectal bleeding 10/31/2015  . Acute non-recurrent maxillary sinusitis 10/31/2015  . Constipation   . Ileus ([place]) 09/29/2015  . Generalized abdominal pain 09/29/2015  . Diarrhea 09/29/2015  . Cirrhosis ([place]) 09/29/2015  . Demand ischemia of myocardium ([place])   . Severe sepsis ([place])   . Sepsis due to pneumonia ([place])   . Type 2 diabetes mellitus with diabetic chronic kidney disease ([place])   . Anxiety state   . Depression   .  Chronic back pain   . Cellulitis of right hand   . Spinal stenosis in cervical region 11/22/2013  . Chest wall pain 08/04/2012  . H/O opioid abuse 08/04/2012  . Opiate dependence ([place]) 06/04/2011  . TOBACCO USER 11/03/2008  . OPIOID WITHDRAWAL 10/25/2008  . Backache 10/25/2008  . HYPERTENSION, BENIGN 06/10/2006  . DM (diabetes mellitus), type 2 ([place]) 04/06/2006  . POLYCYSTIC OVARIAN DISEASE 04/06/2006  . BACK PAIN, LUMBAR, CHRONIC 04/06/2006  . ABNORMAL FINDINGS, ELEVATED BP W/O HTN 04/06/2006    Past Surgical History:  Procedure Laterality Date  . BACK SURGERY    . BREAST SURGERY Left    blocked milk ducts   . CARPAL TUNNEL RELEASE    . CATARACT EXTRACTION W/PHACO Left 02/04/2015   Procedure: CATARACT EXTRACTION PHACO AND INTRAOCULAR LENS PLACEMENT (IOC);  Surgeon: [name], MD;  Location: [facility];  Service: Ophthalmology;  Laterality: Left;  CDE: 4.11  . CHOLECYSTECTOMY    . COLONOSCOPY WITH PROPOFOL N/A 11/12/2015   Procedure: COLONOSCOPY WITH PROPOFOL;  Surgeon: [name], MD;  Location: [facility] END[facility] SUITE;  Service: Endoscopy;  Laterality: N/A;  10:15 am  . ESOPHAGEAL BANDING N/A 11/12/2015   Procedure: ESOPHAGEAL BANDING;  Surgeon: [name], MD;  Location: [facility];  Service: Endoscopy;  Laterality: N/A;  . ESOPHAGOGASTRODUODENOSCOPY  2007  Dr. Oneida Alar: mild antral erythema. Future procedures need to be done with Propofol per notes.   . ESOPHAGOGASTRODUODENOSCOPY (EGD) WITH PROPOFOL N/A 11/12/2015   Procedure: ESOPHAGOGASTRODUODENOSCOPY (EGD) WITH PROPOFOL;  Surgeon: [name], MD;  Location: [facility];  Service: Endoscopy;  Laterality: N/A;  . INCISION AND DRAINAGE WOUND WITH TENDON REPAIR Right 07/03/2016   Procedure: INCISION AND DRAINAGE RIGHT LONG PROXIMAL INTERPHALANGEAL JOINT WOUND WITH  TENDON REPAIR AND CULTURES;  Surgeon: [name], MD;  Location: [place];  Service: Orthopedics;  Laterality: Right;  . NECK SURGERY    . PERIPHERALLY INSERTED  CENTRAL CATHETER INSERTION Right 2007   for treatment with Vancomycin   . POLYPECTOMY  11/12/2015   Procedure: POLYPECTOMY;  Surgeon: [name], MD;  [facility];  Service: Endoscopy;;  transverse colon polyp  . POSTERIOR CERVICAL LAMINECTOMY N/A 11/22/2013   Procedure: POSTERIOR CERVICAL LAMINECTOMY C7/T1, T/1-2;  Surgeon: [name], MD;  Location: [place] [facility] OR[facility];  Service: Neurosurgery;  Laterality: N/A;  POSTERIOR CERVICAL LAMINECTOMY C7/T1, T/1-2  . TEE WITHOUT CARDIOVERSION N/A 08/09/2012   Procedure: TRANSESOPHAGEAL ECHOCARDIOGRAM (TEE);  Surgeon: [name], MD;  [facility];  Service: Cardiovascular;  Laterality: N/A;     OB History    Gravida      Para      Term      Preterm      AB      Living  0     SAB      TAB      Ectopic      Multiple      Live Births               Home Medications    Prior to Admission medications   Medication Sig Start Date End Date Taking? Authorizing Provider  ALPRAZolam Duanne Moron) 0.25 MG tablet Take 1 tablet by mouth 3 (three) times daily as needed. 05/12/17  Yes [provider]  Aspirin-Salicylamide-Caffeine (BC HEADACHE PO) Take 1 Package by mouth daily as needed.    Yes [provider]  B Complex-Biotin-FA (B-50 COMPLEX PO) Take 1 tablet by mouth daily.   Yes [provider]  Biotin w/ Vitamins C & E (HAIR/SKIN/NAILS PO) Take 1 tablet by mouth daily.   Yes [provider]  Buprenorphine HCl-Naloxone HCl 8-2 MG FILM Place 2 Film under the tongue 2 (two) times daily. 05/18/17  Yes [provider]  CHANTIX STARTING MONTH PAK 0.5 MG X 11 & 1 MG X 42 tablet TK ACCORDING TO KIT INSTRUCTIONS 05/12/17  Yes [provider]  fluticasone furoate-vilanterol (BREO ELLIPTA) 200-25 MCG/INH AEPB Inhale 1 puff into the lungs daily.   Yes [provider]  pantoprazole (PROTONIX) 40 MG tablet Take 1 tablet (40 mg total) by mouth 2 (two) times daily.  10/02/15  Yes Sinda Du, MD  pravastatin (PRAVACHOL) 20 MG tablet Take 20 mg by mouth daily.  12/31/14  Yes [provider]  SPIRIVA HANDIHALER 18 MCG inhalation capsule Place 18 mcg into inhaler and inhale daily as needed (sob).  12/31/14  Yes [provider]  vitamin B-12 (CYANOCOBALAMIN) 250 MCG tablet Take 250 mcg by mouth daily.   Yes [provider]  amoxicillin (AMOXIL) 500 MG capsule Take 1 capsule (500 mg total) by mouth 3 (three) times daily. 05/19/17   Nat Christen, MD  azithromycin (ZITHROMAX) 250 MG tablet Take 2 tablets (500 mg total) by mouth daily. Patient not taking: Reported on  05/19/2017 03/28/17   Julianne Rice, MD  cyclobenzaprine (FLEXERIL) 10 MG tablet Take 1 tablet (10 mg total) by mouth 2 (two) times daily as needed for muscle spasms. 05/19/17   Nat Christen, MD  dicyclomine (BENTYL) 20 MG tablet Take 1 tablet (20 mg total) by mouth 2 (two) times daily as needed for spasms. Patient not taking: Reported on 05/19/2017 03/27/17   Julianne Rice, MD  ondansetron (ZOFRAN ODT) 4 MG disintegrating tablet 31m ODT q4 hours prn nausea/vomit Patient not taking: Reported on 05/19/2017 03/27/17   YJulianne Rice MD  predniSONE (DELTASONE) 10 MG tablet Take 1 tablet (10 mg total) by mouth daily with breakfast. 3 tablets for 3 days, 2 tablets for 3 days, 1 tablet for 3 days. 05/19/17   CNat Christen MD    Family History Family History  Problem Relation Age of Onset  . Hypertension Mother   . Hypertension Father   . Cirrhosis Father        Drank some alcohol  . Diabetes Brother   . Diabetes Other   . Cirrhosis Paternal Aunt        No alcohol  . Cirrhosis Paternal Aunt        No alcohol  . Colon cancer Neg Hx     Social History Social History   Tobacco Use  . Smoking status: Current Every Day Smoker    Packs/day: 1.00    Years: 20.00    Pack years: 20.00    Types: Cigarettes  . Smokeless tobacco: Never Used  Substance Use Topics  . Alcohol use:  Yes    Comment: rare  . Drug use: Yes    Types: IV, Marijuana    Comment: prior history of cocaine use, none in 15 years      Allergies   Ciprofloxacin; Nsaids; Shrimp [shellfish allergy]; and Tylenol [acetaminophen]   Review of Systems Review of Systems  All other systems reviewed and are negative.    Physical Exam Updated Vital Signs BP (!) 148/93   Pulse (!) 104   Temp 98.2 F (36.8 C) (Oral)   Resp 17   Ht 5' 5"  (1.651 m)   Wt 88.5 kg (195 lb)   SpO2 99%   BMI 32.45 kg/m   Physical Exam  Constitutional: She is oriented to person, place, and time. She appears well-developed and well-nourished.  Raspy voice.  HENT:  Head: Normocephalic and atraumatic.  Eyes: Conjunctivae are normal.  Neck:  Tenderness in the proximal superior trapezius muscle bilaterally.  No posterior neck tenderness to suggest meningitis  Cardiovascular: Normal rate and regular rhythm.  Pulmonary/Chest: Effort normal and breath sounds normal.  Abdominal: Soft. Bowel sounds are normal.  Musculoskeletal: Normal range of motion.  Neurological: She is alert and oriented to person, place, and time.  Skin: Skin is warm and dry.  Psychiatric: She has a normal mood and affect. Her behavior is normal.  Nursing note and vitals reviewed.    ED Treatments / Results  Labs (all labs ordered are listed, but only abnormal results are displayed) Labs Reviewed  CBC WITH DIFFERENTIAL/PLATELET - Abnormal; Notable for the following components:      Result Value   Platelets 114 (*)    All other components within normal limits  COMPREHENSIVE METABOLIC PANEL - Abnormal; Notable for the following components:   Sodium 129 (*)    Chloride 96 (*)    Glucose, Bld 305 (*)    Calcium 8.8 (*)    Albumin 3.4 (*)  All other components within normal limits  URINALYSIS, ROUTINE W REFLEX MICROSCOPIC - Abnormal; Notable for the following components:   APPearance HAZY (*)    Glucose, UA >=500 (*)    Hgb urine  dipstick LARGE (*)    All other components within normal limits  LIPASE, BLOOD    EKG None  Radiology Dg Chest 2 View  Result Date: 05/19/2017 CLINICAL DATA:  Cough, shortness of breath, chest pain EXAM: CHEST - 2 VIEW COMPARISON:  None. FINDINGS: The heart size and mediastinal contours are within normal limits. Both lungs are clear. The visualized skeletal structures are unremarkable except for mild thoracic spondylosis. Lower cervical fusion hardware partially imaged. IMPRESSION: No active cardiopulmonary disease. Electronically Signed   By: [name].  Shick M.D.   On: 05/19/2017 19:14    Procedures Procedures (including critical care time)  Medications Ordered in ED Medications  sodium chloride 0.9 % bolus 1,000 mL (0 mLs Intravenous Stopped 05/19/17 2025)  sodium chloride 0.9 % bolus 1,000 mL (0 mLs Intravenous Stopped 05/19/17 2147)  aspirin tablet 650 mg (650 mg Oral Given 05/19/17 2043)     Initial Impression / Assessment and Plan / ED Course  I have reviewed the triage vital signs and the nursing notes.  Pertinent labs & imaging results that were available during my care of the patient were reviewed by me and considered in my medical decision making (see chart for details).     Patient presents with weakness and muscle soreness in the trapezius muscle chain.  No evidence of meningitis on physical exam.  White count normal.  Chest x-ray negative.  Patient felt better after IV fluids and oral aspirin.  Discharge medications Flexeril 10 mg, prednisone, amoxicillin 500 mg.  She has been instructed to return if her neck becomes stiff, high fever, mental status changes, neurological deficits.  Final Clinical Impressions(s) / ED Diagnoses   Final diagnoses:  Viral syndrome  Muscle pain  Laryngitis    ED Discharge Orders        Ordered    cyclobenzaprine (FLEXERIL) 10 MG tablet  2 times daily PRN     05/19/17 2122    predniSONE (DELTASONE) 10 MG tablet  Daily with breakfast      05/19/17 2122    amoxicillin (AMOXIL) 500 MG capsule  3 times daily     05/19/17 2123       Nat Christen, MD 05/20/17 1252

## 2017-05-24 ENCOUNTER — Other Ambulatory Visit (HOSPITAL_COMMUNITY): Payer: Self-pay | Admitting: Pulmonary Disease

## 2017-05-24 DIAGNOSIS — E119 Type 2 diabetes mellitus without complications: Secondary | ICD-10-CM | POA: Diagnosis not present

## 2017-05-24 DIAGNOSIS — M542 Cervicalgia: Secondary | ICD-10-CM | POA: Diagnosis not present

## 2017-05-24 DIAGNOSIS — J449 Chronic obstructive pulmonary disease, unspecified: Secondary | ICD-10-CM | POA: Diagnosis not present

## 2017-05-24 DIAGNOSIS — I10 Essential (primary) hypertension: Secondary | ICD-10-CM | POA: Diagnosis not present

## 2017-05-28 ENCOUNTER — Emergency Department (HOSPITAL_COMMUNITY): Payer: Medicare Other

## 2017-05-28 ENCOUNTER — Inpatient Hospital Stay (HOSPITAL_COMMUNITY)
Admission: EM | Admit: 2017-05-28 | Discharge: 2017-06-04 | DRG: 029 | Disposition: A | Discharge status: Home Health | Payer: Medicare Other | Attending: Neurosurgery | Admitting: Neurosurgery

## 2017-05-28 ENCOUNTER — Encounter (HOSPITAL_COMMUNITY): Payer: Self-pay

## 2017-05-28 ENCOUNTER — Other Ambulatory Visit: Payer: Self-pay

## 2017-05-28 ENCOUNTER — Emergency Department (HOSPITAL_COMMUNITY): Payer: Medicare Other | Admitting: Anesthesiology

## 2017-05-28 ENCOUNTER — Ambulatory Visit (HOSPITAL_COMMUNITY)
Admission: RE | Admit: 2017-05-28 | Discharge: 2017-05-28 | Disposition: A | Discharge status: Home / Self Care | Payer: Medicare Other | Source: Ambulatory Visit | Attending: Pulmonary Disease | Admitting: Pulmonary Disease

## 2017-05-28 ENCOUNTER — Encounter (HOSPITAL_COMMUNITY)
Admission: EM | Disposition: A | Discharge status: Home Health | Payer: Self-pay | Source: Home / Self Care | Attending: Neurosurgery

## 2017-05-28 DIAGNOSIS — B379 Candidiasis, unspecified: Secondary | ICD-10-CM | POA: Diagnosis not present

## 2017-05-28 DIAGNOSIS — K219 Gastro-esophageal reflux disease without esophagitis: Secondary | ICD-10-CM | POA: Diagnosis not present

## 2017-05-28 DIAGNOSIS — Z981 Arthrodesis status: Secondary | ICD-10-CM

## 2017-05-28 DIAGNOSIS — F17210 Nicotine dependence, cigarettes, uncomplicated: Secondary | ICD-10-CM | POA: Diagnosis present

## 2017-05-28 DIAGNOSIS — Z91013 Allergy to seafood: Secondary | ICD-10-CM

## 2017-05-28 DIAGNOSIS — G061 Intraspinal abscess and granuloma: Secondary | ICD-10-CM | POA: Diagnosis present

## 2017-05-28 DIAGNOSIS — E1122 Type 2 diabetes mellitus with diabetic chronic kidney disease: Secondary | ICD-10-CM | POA: Diagnosis present

## 2017-05-28 DIAGNOSIS — M5023 Other cervical disc displacement, cervicothoracic region: Secondary | ICD-10-CM

## 2017-05-28 DIAGNOSIS — N189 Chronic kidney disease, unspecified: Secondary | ICD-10-CM | POA: Diagnosis present

## 2017-05-28 DIAGNOSIS — M542 Cervicalgia: Secondary | ICD-10-CM | POA: Diagnosis not present

## 2017-05-28 DIAGNOSIS — E1169 Type 2 diabetes mellitus with other specified complication: Secondary | ICD-10-CM | POA: Diagnosis present

## 2017-05-28 DIAGNOSIS — Z886 Allergy status to analgesic agent status: Secondary | ICD-10-CM

## 2017-05-28 DIAGNOSIS — I10 Essential (primary) hypertension: Secondary | ICD-10-CM | POA: Diagnosis present

## 2017-05-28 DIAGNOSIS — K7581 Nonalcoholic steatohepatitis (NASH): Secondary | ICD-10-CM | POA: Diagnosis present

## 2017-05-28 DIAGNOSIS — G9520 Unspecified cord compression: Secondary | ICD-10-CM

## 2017-05-28 DIAGNOSIS — G8929 Other chronic pain: Secondary | ICD-10-CM | POA: Diagnosis not present

## 2017-05-28 DIAGNOSIS — K7460 Unspecified cirrhosis of liver: Secondary | ICD-10-CM | POA: Diagnosis present

## 2017-05-28 DIAGNOSIS — Z79899 Other long term (current) drug therapy: Secondary | ICD-10-CM | POA: Diagnosis not present

## 2017-05-28 DIAGNOSIS — Z7984 Long term (current) use of oral hypoglycemic drugs: Secondary | ICD-10-CM

## 2017-05-28 DIAGNOSIS — Z7951 Long term (current) use of inhaled steroids: Secondary | ICD-10-CM

## 2017-05-28 DIAGNOSIS — Z881 Allergy status to other antibiotic agents status: Secondary | ICD-10-CM

## 2017-05-28 DIAGNOSIS — H9191 Unspecified hearing loss, right ear: Secondary | ICD-10-CM | POA: Diagnosis present

## 2017-05-28 DIAGNOSIS — F1990 Other psychoactive substance use, unspecified, uncomplicated: Secondary | ICD-10-CM | POA: Diagnosis present

## 2017-05-28 DIAGNOSIS — M4802 Spinal stenosis, cervical region: Secondary | ICD-10-CM | POA: Diagnosis present

## 2017-05-28 DIAGNOSIS — K5900 Constipation, unspecified: Secondary | ICD-10-CM | POA: Diagnosis not present

## 2017-05-28 DIAGNOSIS — F1120 Opioid dependence, uncomplicated: Secondary | ICD-10-CM | POA: Diagnosis not present

## 2017-05-28 DIAGNOSIS — Z6832 Body mass index (BMI) 32.0-32.9, adult: Secondary | ICD-10-CM

## 2017-05-28 DIAGNOSIS — E282 Polycystic ovarian syndrome: Secondary | ICD-10-CM | POA: Diagnosis present

## 2017-05-28 DIAGNOSIS — F17200 Nicotine dependence, unspecified, uncomplicated: Secondary | ICD-10-CM | POA: Diagnosis present

## 2017-05-28 DIAGNOSIS — B957 Other staphylococcus as the cause of diseases classified elsewhere: Secondary | ICD-10-CM | POA: Diagnosis not present

## 2017-05-28 DIAGNOSIS — M4622 Osteomyelitis of vertebra, cervical region: Secondary | ICD-10-CM | POA: Diagnosis not present

## 2017-05-28 DIAGNOSIS — L0211 Cutaneous abscess of neck: Secondary | ICD-10-CM | POA: Diagnosis not present

## 2017-05-28 DIAGNOSIS — F419 Anxiety disorder, unspecified: Secondary | ICD-10-CM | POA: Diagnosis present

## 2017-05-28 DIAGNOSIS — E669 Obesity, unspecified: Secondary | ICD-10-CM | POA: Diagnosis present

## 2017-05-28 DIAGNOSIS — Z419 Encounter for procedure for purposes other than remedying health state, unspecified: Secondary | ICD-10-CM

## 2017-05-28 DIAGNOSIS — F329 Major depressive disorder, single episode, unspecified: Secondary | ICD-10-CM | POA: Diagnosis present

## 2017-05-28 DIAGNOSIS — M6281 Muscle weakness (generalized): Secondary | ICD-10-CM | POA: Diagnosis not present

## 2017-05-28 DIAGNOSIS — Z888 Allergy status to other drugs, medicaments and biological substances status: Secondary | ICD-10-CM | POA: Diagnosis not present

## 2017-05-28 DIAGNOSIS — M4803 Spinal stenosis, cervicothoracic region: Secondary | ICD-10-CM | POA: Diagnosis not present

## 2017-05-28 DIAGNOSIS — M549 Dorsalgia, unspecified: Secondary | ICD-10-CM

## 2017-05-28 DIAGNOSIS — F32A Depression, unspecified: Secondary | ICD-10-CM | POA: Diagnosis present

## 2017-05-28 DIAGNOSIS — R531 Weakness: Secondary | ICD-10-CM | POA: Diagnosis not present

## 2017-05-28 DIAGNOSIS — E119 Type 2 diabetes mellitus without complications: Secondary | ICD-10-CM

## 2017-05-28 DIAGNOSIS — M545 Low back pain: Secondary | ICD-10-CM | POA: Diagnosis not present

## 2017-05-28 DIAGNOSIS — F411 Generalized anxiety disorder: Secondary | ICD-10-CM | POA: Diagnosis present

## 2017-05-28 DIAGNOSIS — Z8614 Personal history of Methicillin resistant Staphylococcus aureus infection: Secondary | ICD-10-CM

## 2017-05-28 DIAGNOSIS — I129 Hypertensive chronic kidney disease with stage 1 through stage 4 chronic kidney disease, or unspecified chronic kidney disease: Secondary | ICD-10-CM | POA: Diagnosis present

## 2017-05-28 HISTORY — PX: ANTERIOR CERVICAL DECOMP/DISCECTOMY FUSION: SHX1161

## 2017-05-28 HISTORY — DX: Cutaneous abscess, unspecified: L02.91

## 2017-05-28 LAB — CBC WITH DIFFERENTIAL/PLATELET
BASOS ABS: 0 10*3/uL (ref 0.0–0.1)
Basophils Relative: 0 %
EOS PCT: 0 %
Eosinophils Absolute: 0 10*3/uL (ref 0.0–0.7)
HEMATOCRIT: 44.5 % (ref 36.0–46.0)
Hemoglobin: 15.2 g/dL — ABNORMAL HIGH (ref 12.0–15.0)
LYMPHS PCT: 6 %
Lymphs Abs: 1.1 10*3/uL (ref 0.7–4.0)
MCH: 31.8 pg (ref 26.0–34.0)
MCHC: 34.2 g/dL (ref 30.0–36.0)
MCV: 93.1 fL (ref 78.0–100.0)
MONO ABS: 1 10*3/uL (ref 0.1–1.0)
MONOS PCT: 6 %
NEUTROS ABS: 15.6 10*3/uL (ref 1.7–7.7)
Neutrophils Relative %: 88 %
PLATELETS: 217 10*3/uL (ref 150–400)
RBC: 4.78 MIL/uL (ref 3.87–5.11)
RDW: 13.4 % (ref 11.5–15.5)
WBC: 17.7 10*3/uL — ABNORMAL HIGH (ref 4.0–10.5)

## 2017-05-28 LAB — I-STAT CG4 LACTIC ACID, ED
Lactic Acid, Venous: 2.73 mmol/L (ref 0.5–1.9)
Lactic Acid, Venous: 1.95 mmol/L — ABNORMAL HIGH (ref 0.5–1.9)

## 2017-05-28 LAB — I-STAT BETA HCG BLOOD, ED (MC, WL, AP ONLY)

## 2017-05-28 LAB — POCT I-STAT 4, (NA,K, GLUC, HGB,HCT)
GLUCOSE: 134 mg/dL — AB (ref 65–99)
Glucose, Bld: 132 mg/dL — ABNORMAL HIGH (ref 65–99)
HEMATOCRIT: 39 % (ref 36.0–46.0)
HEMATOCRIT: 39 % (ref 36.0–46.0)
Hemoglobin: 13.3 g/dL (ref 12.0–15.0)
Hemoglobin: 13.3 g/dL (ref 12.0–15.0)
Potassium: 4.3 mmol/L (ref 3.5–5.1)
Potassium: 4.7 mmol/L (ref 3.5–5.1)
SODIUM: 136 mmol/L (ref 135–145)
Sodium: 135 mmol/L (ref 135–145)

## 2017-05-28 LAB — COMPREHENSIVE METABOLIC PANEL
ALK PHOS: 118 U/L (ref 38–126)
ALT: 32 U/L (ref 14–54)
AST: 29 U/L (ref 15–41)
Albumin: 3.5 g/dL (ref 3.5–5.0)
Anion gap: 12 (ref 5–15)
BUN: 30 mg/dL — AB (ref 6–20)
CALCIUM: 9.6 mg/dL (ref 8.9–10.3)
CO2: 24 mmol/L (ref 22–32)
Chloride: 96 mmol/L — ABNORMAL LOW (ref 101–111)
Creatinine, Ser: 1.21 mg/dL — ABNORMAL HIGH (ref 0.44–1.00)
GFR, EST NON AFRICAN AMERICAN: 53 mL/min — AB (ref >60)
Glucose, Bld: 339 mg/dL — ABNORMAL HIGH (ref 65–99)
Potassium: 4.6 mmol/L (ref 3.5–5.1)
SODIUM: 132 mmol/L — AB (ref 135–145)
Total Bilirubin: 0.6 mg/dL (ref 0.3–1.2)
Total Protein: 8.7 g/dL — ABNORMAL HIGH (ref 6.5–8.1)

## 2017-05-28 LAB — PROTIME-INR
INR: 1.2
Prothrombin Time: 15.1 seconds (ref 11.4–15.2)

## 2017-05-28 LAB — SEDIMENTATION RATE: SED RATE: 18 mm/h (ref 0–22)

## 2017-05-28 SURGERY — ANTERIOR CERVICAL DECOMPRESSION/DISCECTOMY FUSION 2 LEVELS
Anesthesia: General | Site: Spine Cervical

## 2017-05-28 MED ORDER — HEMOSTATIC AGENTS (NO CHARGE) OPTIME
TOPICAL | Status: DC | PRN
  Administered 2017-05-28: 1 via TOPICAL

## 2017-05-28 MED ORDER — SUCCINYLCHOLINE CHLORIDE 200 MG/10ML IV SOSY
PREFILLED_SYRINGE | INTRAVENOUS | Status: AC
  Filled 2017-05-28: qty 10

## 2017-05-28 MED ORDER — MORPHINE SULFATE (PF) 4 MG/ML IV SOLN
4.0000 mg | Freq: Once | INTRAVENOUS | Status: AC
  Administered 2017-05-28: 4 mg via INTRAVENOUS
  Filled 2017-05-28: qty 1

## 2017-05-28 MED ORDER — ROCURONIUM 10MG/ML (10ML) SYRINGE FOR MEDFUSION PUMP - OPTIME
INTRAVENOUS | Status: DC | PRN
  Administered 2017-05-28: 20 mg via INTRAVENOUS
  Administered 2017-05-28: 40 mg via INTRAVENOUS

## 2017-05-28 MED ORDER — PROPOFOL 10 MG/ML IV BOLUS
INTRAVENOUS | Status: AC
  Filled 2017-05-28: qty 20

## 2017-05-28 MED ORDER — DEXAMETHASONE SODIUM PHOSPHATE 10 MG/ML IJ SOLN
INTRAMUSCULAR | Status: DC | PRN
  Administered 2017-05-28: 10 mg via INTRAVENOUS

## 2017-05-28 MED ORDER — VANCOMYCIN HCL 10 G IV SOLR
1250.0000 mg | INTRAVENOUS | Status: DC
  Administered 2017-05-28: 1250 mg via INTRAVENOUS
  Filled 2017-05-28: qty 1250

## 2017-05-28 MED ORDER — SODIUM CHLORIDE 0.9 % IV BOLUS
1000.0000 mL | Freq: Once | INTRAVENOUS | Status: AC
  Administered 2017-05-28: 1000 mL via INTRAVENOUS

## 2017-05-28 MED ORDER — PROPOFOL 10 MG/ML IV BOLUS
INTRAVENOUS | Status: DC | PRN
  Administered 2017-05-28: 170 mg via INTRAVENOUS

## 2017-05-28 MED ORDER — DEXTROSE 50 % IV SOLN: 25.0000 mL | INTRAVENOUS | Status: DC | PRN

## 2017-05-28 MED ORDER — LACTATED RINGERS IV SOLN
INTRAVENOUS | Status: DC | PRN
  Administered 2017-05-28 (×2): via INTRAVENOUS

## 2017-05-28 MED ORDER — MEPERIDINE HCL 50 MG/ML IJ SOLN: 6.2500 mg | INTRAMUSCULAR | Status: DC | PRN

## 2017-05-28 MED ORDER — INSULIN REGULAR BOLUS VIA INFUSION
0.0000 [IU] | Freq: Three times a day (TID) | INTRAVENOUS | Status: DC
  Filled 2017-05-28: qty 10

## 2017-05-28 MED ORDER — SUGAMMADEX SODIUM 200 MG/2ML IV SOLN
INTRAVENOUS | Status: AC
  Filled 2017-05-28: qty 2

## 2017-05-28 MED ORDER — 0.9 % SODIUM CHLORIDE (POUR BTL) OPTIME
TOPICAL | Status: DC | PRN
  Administered 2017-05-28: 1000 mL

## 2017-05-28 MED ORDER — SUCCINYLCHOLINE CHLORIDE 20 MG/ML IJ SOLN
INTRAMUSCULAR | Status: DC | PRN
  Administered 2017-05-28: 140 mg via INTRAVENOUS

## 2017-05-28 MED ORDER — LIDOCAINE HCL (CARDIAC) PF 100 MG/5ML IV SOSY
PREFILLED_SYRINGE | INTRAVENOUS | Status: DC | PRN
  Administered 2017-05-28: 50 mg via INTRATRACHEAL

## 2017-05-28 MED ORDER — DEXAMETHASONE SODIUM PHOSPHATE 10 MG/ML IJ SOLN
INTRAMUSCULAR | Status: AC
  Filled 2017-05-28: qty 1

## 2017-05-28 MED ORDER — LIDOCAINE 2% (20 MG/ML) 5 ML SYRINGE
INTRAMUSCULAR | Status: AC
  Filled 2017-05-28: qty 5

## 2017-05-28 MED ORDER — SODIUM CHLORIDE 0.9 % IV SOLN: INTRAVENOUS | Status: DC

## 2017-05-28 MED ORDER — PROMETHAZINE HCL 25 MG/ML IJ SOLN: 6.2500 mg | INTRAMUSCULAR | Status: DC | PRN

## 2017-05-28 MED ORDER — ONDANSETRON HCL 4 MG/2ML IJ SOLN
INTRAMUSCULAR | Status: DC | PRN
  Administered 2017-05-28: 4 mg via INTRAVENOUS

## 2017-05-28 MED ORDER — FENTANYL CITRATE (PF) 250 MCG/5ML IJ SOLN
INTRAMUSCULAR | Status: AC
Start: 2017-05-28 — End: 2017-05-28
  Filled 2017-05-28: qty 5

## 2017-05-28 MED ORDER — FENTANYL CITRATE (PF) 250 MCG/5ML IJ SOLN
INTRAMUSCULAR | Status: DC | PRN
  Administered 2017-05-28 (×3): 50 ug via INTRAVENOUS

## 2017-05-28 MED ORDER — SODIUM CHLORIDE 0.9 % IV SOLN
INTRAVENOUS | Status: DC
  Filled 2017-05-28: qty 1

## 2017-05-28 MED ORDER — GADOBENATE DIMEGLUMINE 529 MG/ML IV SOLN
18.0000 mL | Freq: Once | INTRAVENOUS | Status: AC | PRN
  Administered 2017-05-28: 18 mL via INTRAVENOUS

## 2017-05-28 MED ORDER — PIPERACILLIN-TAZOBACTAM 3.375 G IVPB 30 MIN
3.3750 g | Freq: Once | INTRAVENOUS | Status: AC
  Administered 2017-05-28: 3.375 g via INTRAVENOUS
  Filled 2017-05-28: qty 50

## 2017-05-28 MED ORDER — MIDAZOLAM HCL 2 MG/2ML IJ SOLN
INTRAMUSCULAR | Status: AC
Start: 2017-05-28 — End: 2017-05-28
  Filled 2017-05-28: qty 2

## 2017-05-28 MED ORDER — LIDOCAINE-EPINEPHRINE 0.5 %-1:200000 IJ SOLN
INTRAMUSCULAR | Status: AC
Start: 2017-05-28 — End: 2017-05-28
  Filled 2017-05-28: qty 1

## 2017-05-28 MED ORDER — HYDROMORPHONE HCL 2 MG/ML IJ SOLN
0.2500 mg | INTRAMUSCULAR | Status: DC | PRN
  Administered 2017-05-29 (×2): 0.5 mg via INTRAVENOUS

## 2017-05-28 MED ORDER — THROMBIN 5000 UNITS EX SOLR
CUTANEOUS | Status: AC
  Filled 2017-05-28: qty 10000

## 2017-05-28 MED ORDER — LIDOCAINE-EPINEPHRINE 0.5 %-1:200000 IJ SOLN
INTRAMUSCULAR | Status: DC | PRN
  Administered 2017-05-28: 4 mL

## 2017-05-28 MED ORDER — MIDAZOLAM HCL 2 MG/2ML IJ SOLN
INTRAMUSCULAR | Status: DC | PRN
  Administered 2017-05-28: 2 mg via INTRAVENOUS

## 2017-05-28 MED ORDER — PIPERACILLIN-TAZOBACTAM 3.375 G IVPB: 3.3750 g | Freq: Three times a day (TID) | INTRAVENOUS | Status: DC

## 2017-05-28 MED ORDER — LACTATED RINGERS IV SOLN: INTRAVENOUS | Status: DC

## 2017-05-28 MED ORDER — LIDOCAINE 5 % EX PTCH
1.0000 | MEDICATED_PATCH | CUTANEOUS | Status: DC
  Administered 2017-05-28: 1 via TRANSDERMAL
  Filled 2017-05-28: qty 1

## 2017-05-28 MED ORDER — THROMBIN 5000 UNITS EX SOLR
CUTANEOUS | Status: DC | PRN
  Administered 2017-05-28 (×2): 5000 [IU] via TOPICAL

## 2017-05-28 MED ORDER — ALBUTEROL SULFATE HFA 108 (90 BASE) MCG/ACT IN AERS
INHALATION_SPRAY | RESPIRATORY_TRACT | Status: AC
  Filled 2017-05-28: qty 6.7

## 2017-05-28 MED ORDER — ONDANSETRON HCL 4 MG/2ML IJ SOLN
INTRAMUSCULAR | Status: AC
  Filled 2017-05-28: qty 2

## 2017-05-28 MED ORDER — PHENYLEPHRINE HCL 10 MG/ML IJ SOLN
INTRAMUSCULAR | Status: DC | PRN
  Administered 2017-05-28 (×2): 40 ug via INTRAVENOUS

## 2017-05-28 SURGICAL SUPPLY — 54 items
ADH SKN CLS APL DERMABOND .7 (GAUZE/BANDAGES/DRESSINGS) ×1
BUR DRUM 4.0 (BURR) ×2 IMPLANT
BUR DRUM 4.0MM (BURR) ×1
BUR MATCHSTICK NEURO 3.0 LAGG (BURR) ×3 IMPLANT
CANISTER SUCT 3000ML PPV (MISCELLANEOUS) ×3 IMPLANT
CARTRIDGE OIL MAESTRO DRILL (MISCELLANEOUS) ×1 IMPLANT
CONT SPEC STER OR (MISCELLANEOUS) ×3 IMPLANT
DECANTER SPIKE VIAL GLASS SM (MISCELLANEOUS) ×3 IMPLANT
DERMABOND ADVANCED (GAUZE/BANDAGES/DRESSINGS) ×2
DERMABOND ADVANCED .7 DNX12 (GAUZE/BANDAGES/DRESSINGS) ×1 IMPLANT
DIFFUSER DRILL AIR PNEUMATIC (MISCELLANEOUS) ×3 IMPLANT
DRAPE HALF SHEET 40X57 (DRAPES) ×2 IMPLANT
DRAPE LAPAROTOMY 100X72 PEDS (DRAPES) ×3 IMPLANT
DRAPE MICROSCOPE LEICA (MISCELLANEOUS) ×3 IMPLANT
DURAPREP 6ML APPLICATOR 50/CS (WOUND CARE) ×3 IMPLANT
ELECT COATED BLADE 2.86 ST (ELECTRODE) ×3 IMPLANT
ELECT REM PT RETURN 9FT ADLT (ELECTROSURGICAL) ×3
ELECTRODE REM PT RTRN 9FT ADLT (ELECTROSURGICAL) ×1 IMPLANT
GAUZE SPONGE 4X4 16PLY XRAY LF (GAUZE/BANDAGES/DRESSINGS) IMPLANT
GLOVE BIOGEL PI IND STRL 8 (GLOVE) IMPLANT
GLOVE BIOGEL PI INDICATOR 8 (GLOVE) ×8
GLOVE ECLIPSE 6.5 STRL STRAW (GLOVE) ×3 IMPLANT
GLOVE ECLIPSE 7.5 STRL STRAW (GLOVE) ×6 IMPLANT
GLOVE EXAM NITRILE LRG STRL (GLOVE) IMPLANT
GLOVE EXAM NITRILE XL STR (GLOVE) IMPLANT
GLOVE EXAM NITRILE XS STR PU (GLOVE) IMPLANT
GOWN STRL REUS W/ TWL LRG LVL3 (GOWN DISPOSABLE) ×3 IMPLANT
GOWN STRL REUS W/ TWL XL LVL3 (GOWN DISPOSABLE) IMPLANT
GOWN STRL REUS W/TWL 2XL LVL3 (GOWN DISPOSABLE) ×4 IMPLANT
GOWN STRL REUS W/TWL LRG LVL3 (GOWN DISPOSABLE) ×3
GOWN STRL REUS W/TWL XL LVL3 (GOWN DISPOSABLE)
KIT BASIN OR (CUSTOM PROCEDURE TRAY) ×3 IMPLANT
KIT TURNOVER KIT B (KITS) ×3 IMPLANT
NDL HYPO 25X1 1.5 SAFETY (NEEDLE) ×1 IMPLANT
NDL SPNL 22GX3.5 QUINCKE BK (NEEDLE) ×1 IMPLANT
NEEDLE HYPO 25X1 1.5 SAFETY (NEEDLE) ×3 IMPLANT
NEEDLE SPNL 22GX3.5 QUINCKE BK (NEEDLE) ×3 IMPLANT
NS IRRIG 1000ML POUR BTL (IV SOLUTION) ×3 IMPLANT
OIL CARTRIDGE MAESTRO DRILL (MISCELLANEOUS) ×3
PACK LAMINECTOMY NEURO (CUSTOM PROCEDURE TRAY) ×3 IMPLANT
PAD ARMBOARD 7.5X6 YLW CONV (MISCELLANEOUS) ×9 IMPLANT
PLATE HELIX R 22MM (Plate) ×2 IMPLANT
RUBBERBAND STERILE (MISCELLANEOUS) ×6 IMPLANT
SCREW 4.0X13 (Screw) IMPLANT
SCREW 4.0X13MM (Screw) ×8 IMPLANT
SPACER ACF PARALLEL 7MM (Bone Implant) ×2 IMPLANT
SPONGE INTESTINAL PEANUT (DISPOSABLE) ×5 IMPLANT
SPONGE SURGIFOAM ABS GEL SZ50 (HEMOSTASIS) ×3 IMPLANT
SUT VIC AB 0 CT1 27 (SUTURE) ×3
SUT VIC AB 0 CT1 27XBRD ANTBC (SUTURE) IMPLANT
SUT VIC AB 3-0 SH 8-18 (SUTURE) ×5 IMPLANT
TOWEL GREEN STERILE (TOWEL DISPOSABLE) ×3 IMPLANT
TOWEL GREEN STERILE FF (TOWEL DISPOSABLE) ×3 IMPLANT
WATER STERILE IRR 1000ML POUR (IV SOLUTION) ×3 IMPLANT

## 2017-05-28 NOTE — ED Notes (Signed)
Patient transported to X-ray 

## 2017-05-28 NOTE — ED Provider Notes (Signed)
Patient placed in Quick Look pathway, seen and evaluated   Chief Complaint: neck pain  HPI:   Jamie Bell is a 47 y.o. female with hx of DM and surgery of her cervical spine who presents to the ED with neck pain that started 12 days ago. Patient reports she was evaluated in the ED last week and treated with steroids and antibiotics. Patient reports the symptoms worsened and she went to her PCP who ordered an MRI. Patient had the MRI today and was called by her PCP and told to come to the ED for evaluation by the neurosurgeon due to infection in the spinal cord.   ROS: M/S: neck pain  Physical Exam:  BP (!) 142/100 (BP Location: Right Arm)   Pulse (!) 110   Temp 98.1 F (36.7 C) (Oral)   Resp 16   Ht 5' 5"  (1.651 m)   Wt 88.5 kg (195 lb)   SpO2 98%   BMI 32.45 kg/m    Gen: No distress  Neuro: Awake and Alert  Skin: Warm and dry  Neck: Scar that is healed from surgery 2015. Tender on palpation. Limited range of motion due to pain.    Mr Cervical Spine W Wo Contrast  Result Date: 05/28/2017 CLINICAL DATA:  Neck pain. Back pain. BILATERAL lower extremity pain and weakness and numbness for the past week. Difficulty moving LEFT arm. No known injury. Previous cervical fusion. EXAM: MRI CERVICAL SPINE WITHOUT AND WITH CONTRAST TECHNIQUE: Multiplanar and multiecho pulse sequences of the cervical spine, to include the craniocervical junction and cervicothoracic junction, were obtained without and with intravenous contrast. CONTRAST:  67m MULTIHANCE GADOBENATE DIMEGLUMINE 529 MG/ML IV SOLN COMPARISON:  MRI 07/24/2013. FINDINGS: Alignment: Straightening of the normal cervical lordosis. No subluxation. Vertebrae: Abnormal edema and enhancement of the C3 and C4 vertebral bodies, concerning for osteomyelitis related to C3-4 diskitis. Solid arthrodesis C5-C6. Cord: There is severe cord compression, C3-C4 due to a combination of disc extrusion and enhancing ventral soft tissue, possible epidural  abscess. Canal diameter 4-5 mm. Abnormal cord signal is suspected, reflecting severe compression. Posterior Fossa, vertebral arteries, paraspinal tissues: Unremarkable. Disc levels: C2-3:  Normal. C3-4: Disc space narrowing. T2 hyperintensity, with regional bone marrow edema, concerning for discitis and osteomyelitis. Central disc extrusion with surrounding enhancement, concerning for epidural abscess. This could measure up to 4 mm in thickness. Severe cord compression, canal diameter 4-5 mm. C4-5:  Disc space narrowing.  No protrusion. C5-6:  Solid arthrodesis. C6-7:  Annular bulge.  No impingement. C7-T1: Central disc extrusion. BILATERAL C8 foraminal narrowing. Slight cord flattening without cord compression. T1-T2: Central and leftward disc extrusion. Slight LEFT-sided cord flattening. LEFT T1 foraminal narrowing. IMPRESSION: C3-4 discal hyperintensity, central extrusion, and enhancing ventral epidural soft tissue along with edematous adjacent vertebrae. 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 Cord compression with suspected abnormal cord signal. Solid appearing C5-6 ACDF. Potentially symptomatic compressive disc pathology at C7-T1, and T1-T2, without signs of infection. These results were called by telephone at the time of interpretation on 05/28/2017 at 1:24 pm to Dr. ESinda Du, who verbally acknowledged these results. Electronically Signed   By: JStaci RighterM.D.   On: 05/28/2017 13:25     Initiation of care has begun. The patient has been counseled on the process, plan, and necessity for staying for the completion/evaluation, and the remainder of the medical screening examination    NAshley Murrain NP 05/28/17 [phone]  Blanchie Dessert, MD 05/28/17 2100

## 2017-05-28 NOTE — ED Notes (Signed)
Aware of lactic acid.  Patient already prioritized for next room

## 2017-05-28 NOTE — H&P (Signed)
Jamie Bell is an 47 y.o. female.   Chief Complaint: weakness, acute onset, left deltoid HPI: Jamie Bell noticed pain in her neck over a week ago. She went to her primary care physician, and an MRI was ordered of the Cervical spine The neck pain was also associated with fairly rapid onset of hoarseness. MRI revealed spinal cord compression. She was told to come to the ED by her primary care physician.  Past Medical History:  Diagnosis Date  . Chronic back pain   . Cirrhosis ([place])   . Constipation   . DDD (degenerative disc disease)   . Decreased hearing on the right.   F/u by ENT in the past and no further management.  . Depression   . Diabetes mellitus   . Dyspnea    with exertion  . GERD (gastroesophageal reflux disease)   . Hypertension    took med. for HTN, several yrs. ago, no longer needs , seen by Dr. Lattie Haw last yr. during a hosp. at [facility], for MRSA bacteremia   . MRSA bacteremia 2014   Tx with Vancomycin  . NASH (nonalcoholic steatohepatitis)   . PCOS (polycystic ovarian syndrome)   . Pneumonia 2016  . Polysubstance abuse ([place])   . Sciatica    HNP- Cerv. 7- T2    Past Surgical History:  Procedure Laterality Date  . BACK SURGERY    . BREAST SURGERY Left    blocked milk ducts   . CARPAL TUNNEL RELEASE    . CATARACT EXTRACTION W/PHACO Left 02/04/2015   Procedure: CATARACT EXTRACTION PHACO AND INTRAOCULAR LENS PLACEMENT (IOC);  Surgeon: [name], MD;  Location: [facility];  Service: Ophthalmology;  Laterality: Left;  CDE: 4.11  . CHOLECYSTECTOMY    . COLONOSCOPY WITH PROPOFOL N/A 11/12/2015   Procedure: COLONOSCOPY WITH PROPOFOL;  Surgeon: [name], MD;  [facility]: [facility];  Service: Endoscopy;  Laterality: N/A;  10:15 am  . ESOPHAGEAL BANDING N/A 11/12/2015   Procedure: ESOPHAGEAL BANDING;  Surgeon: [name], MD;  [facility];  Service: Endoscopy;  Laterality: N/A;  . ESOPHAGOGASTRODUODENOSCOPY  2007   Dr. Oneida Alar: mild antral  erythema. Future procedures need to be done with Propofol per notes.   . ESOPHAGOGASTRODUODENOSCOPY (EGD) WITH PROPOFOL N/A 11/12/2015   Procedure: ESOPHAGOGASTRODUODENOSCOPY (EGD) WITH PROPOFOL;  Surgeon: [name], MD;  [facility];  Service: Endoscopy;  Laterality: N/A;  . INCISION AND DRAINAGE WOUND WITH TENDON REPAIR Right 07/03/2016   Procedure: INCISION AND DRAINAGE RIGHT LONG PROXIMAL INTERPHALANGEAL JOINT WOUND WITH  TENDON REPAIR AND CULTURES;  Surgeon: [name], MD;  Location: [place];  Service: Orthopedics;  Laterality: Right;  . NECK SURGERY    . PERIPHERALLY INSERTED CENTRAL CATHETER INSERTION Right 2007   for treatment with Vancomycin   . POLYPECTOMY  11/12/2015   Procedure: POLYPECTOMY;  Surgeon: [name], MD;  [facility];  Service: Endoscopy;;  transverse colon polyp  . POSTERIOR CERVICAL LAMINECTOMY N/A 11/22/2013   Procedure: POSTERIOR CERVICAL LAMINECTOMY C7/T1, T/1-2;  Surgeon: [name], MD;  Location: [facility];  Service: Neurosurgery;  Laterality: N/A;  POSTERIOR CERVICAL LAMINECTOMY C7/T1, T/1-2  . TEE WITHOUT CARDIOVERSION N/A 08/09/2012   Procedure: TRANSESOPHAGEAL ECHOCARDIOGRAM (TEE);  Surgeon: [name], MD;  [facility];  Service: Cardiovascular;  Laterality: N/A;    Family History  Problem Relation Age of Onset  . Hypertension Mother   . Hypertension Father   . Cirrhosis Father  Drank some alcohol  . Diabetes Brother   . Diabetes Other   . Cirrhosis Paternal Aunt        No alcohol  . Cirrhosis Paternal Aunt        No alcohol  . Colon cancer Neg Hx    Social History:  reports that she has been smoking cigarettes.  She has a 20.00 pack-year smoking history. She has never used smokeless tobacco. She reports that she drinks alcohol. She reports that she has current or past drug history. Drugs: IV and Marijuana.  Allergies:  Allergies  Allergen Reactions  . Chantix [Varenicline] Other  (See Comments)    Vivid nightmares  . Ciprofloxacin Swelling  . Nsaids Hives    Pt takes ibuprofen with no reaction  . Shrimp [Shellfish Allergy] Itching    Makes hands itch   . Tylenol [Acetaminophen] Other (See Comments)    Patient has cirrhosis of the liver and this causes elevated levels     (Not in a hospital admission)  Results for orders placed or performed during the hospital encounter of 05/28/17 (from the past 48 hour(s))  Comprehensive metabolic panel     Status: Abnormal   Collection Time: 05/28/17  3:10 PM  Result Value Ref Range   Sodium 132 (L) 135 - 145 mmol/L   Potassium 4.6 3.5 - 5.1 mmol/L   Chloride 96 (L) 101 - 111 mmol/L   CO2 24 22 - 32 mmol/L   Glucose, Bld 339 (H) 65 - 99 mg/dL   BUN 30 (H) 6 - 20 mg/dL   Creatinine, Ser 1.21 (H) 0.44 - 1.00 mg/dL   Calcium 9.6 8.9 - 10.3 mg/dL   Total Protein 8.7 (H) 6.5 - 8.1 g/dL   Albumin 3.5 3.5 - 5.0 g/dL   AST 29 15 - 41 U/L   ALT 32 14 - 54 U/L   Alkaline Phosphatase 118 38 - 126 U/L   Total Bilirubin 0.6 0.3 - 1.2 mg/dL   GFR calc non Af Amer 53 (L) >60 mL/min   GFR calc Af Amer >60 >60 mL/min    Comment: (NOTE) The eGFR has been calculated using the CKD EPI equation. This calculation has not been validated in all clinical situations. eGFR's persistently <60 mL/min signify possible Chronic Kidney Disease.    Anion gap 12 5 - 15    Comment: Performed at [facility] [address], [place], [place] [address]  CBC with Differential     Status: Abnormal   Collection Time: 05/28/17  3:10 PM  Result Value Ref Range   WBC 17.7 (H) 4.0 - 10.5 K/uL   RBC 4.78 3.87 - 5.11 MIL/uL   Hemoglobin 15.2 (H) 12.0 - 15.0 g/dL   HCT 44.5 36.0 - 46.0 %   MCV 93.1 78.0 - 100.0 fL   MCH 31.8 26.0 - 34.0 pg   MCHC 34.2 30.0 - 36.0 g/dL   RDW 13.4 11.5 - 15.5 %   Platelets 217 150 - 400 K/uL   Neutrophils Relative % 88 %   Neutro Abs 15.6 1.7 - 7.7 K/uL    Comment: CORRECTED ON 05/03 AT 1548: PREVIOUSLY  REPORTED AS 15.9   Lymphocytes Relative 6 %    Comment: CORRECTED ON 05/03 AT 1548: PREVIOUSLY REPORTED AS 7   Lymphs Abs 1.1 0.7 - 4.0 K/uL    Comment: CORRECTED ON 05/03 AT 1548: PREVIOUSLY REPORTED AS 1.2   Monocytes Relative 6 %    Comment: CORRECTED ON 05/03 AT  1548: PREVIOUSLY REPORTED AS 5   Monocytes Absolute 1.0 0.1 - 1.0 K/uL    Comment: CORRECTED ON 05/03 AT 1548: PREVIOUSLY REPORTED AS 0.9   Eosinophils Relative 0 %   Eosinophils Absolute 0.0 0.0 - 0.7 K/uL   Basophils Relative 0 %   Basophils Absolute 0.0 0.0 - 0.1 K/uL    Comment: Performed at [facility], [place] [address], [place], [place] [address]  Protime-INR     Status: None   Collection Time: 05/28/17  3:10 PM  Result Value Ref Range   Prothrombin Time 15.1 11.4 - 15.2 seconds   INR 1.20     Comment: Performed at [facility] [address] [place], [place] [address]  I-Stat beta hCG blood, ED     Status: None   Collection Time: 05/28/17  3:20 PM  Result Value Ref Range   I-stat hCG, quantitative <5.0 <5 mIU/mL   Comment 3            Comment:   GEST. AGE      CONC.  (mIU/mL)   <=1 WEEK        5 - 50     2 WEEKS       50 - 500     3 WEEKS       100 - 10,000     4 WEEKS     1,000 - 30,000        FEMALE AND NON-PREGNANT FEMALE:     LESS THAN 5 mIU/mL   I-Stat CG4 Lactic Acid, ED     Status: Abnormal   Collection Time: 05/28/17  3:22 PM  Result Value Ref Range   Lactic Acid, Venous 2.73 (HH) 0.5 - 1.9 mmol/L   Comment NOTIFIED PHYSICIAN   I-Stat CG4 Lactic Acid, ED     Status: Abnormal   Collection Time: 05/28/17  5:16 PM  Result Value Ref Range   Lactic Acid, Venous 1.95 (H) 0.5 - 1.9 mmol/L   Dg Cervical Spine Complete  Result Date: 05/28/2017 CLINICAL DATA:  47 year old female with neck pain times 12 days, progressive. Decreased range of motion. Prior surgery. EXAM: CERVICAL SPINE - COMPLETE 4+ VIEW COMPARISON:  [facility] Cervical spine MRI 0956 hours today. Cervical radiographs  10/11/2010. FINDINGS: Previous C5-C6 ACDF. Prevertebral soft tissue contours appear stable since 2012. Solid interbody arthrodesis suspected. No evidence of hardware loosening. The abnormal marrow signal in findings at the C3-C4 level today are not correlated with osteolysis or acute radiographic osseous abnormality. The L3-L4 disc space has narrow it since 2012 with chronic endplate osteophytosis. Bilateral posterior element alignment is within normal limits. Cervicothoracic junction alignment is within normal limits. Preserved C1-C2 alignment. Negative visible upper chest. IMPRESSION: 1. No acute radiographic finding to correspond to the abnormal MRI appearance of C3-C4 today on the [facility] study (please see that report). 2. Prior C5-C6 ACDF with solid appearing arthrodesis. Electronically Signed   By: Genevie Ann M.D.   On: 05/28/2017 19:14   Mr Cervical Spine W Wo Contrast  Result Date: 05/28/2017 CLINICAL DATA:  Neck pain. Back pain. BILATERAL lower extremity pain and weakness and numbness for the past week. Difficulty moving LEFT arm. No known injury. Previous cervical fusion. EXAM: MRI CERVICAL SPINE WITHOUT AND WITH CONTRAST TECHNIQUE: Multiplanar and multiecho pulse sequences of the cervical spine, to include the craniocervical junction and cervicothoracic junction, were obtained without and with intravenous contrast. CONTRAST:  50m MULTIHANCE GADOBENATE DIMEGLUMINE 529 MG/ML IV  SOLN COMPARISON:  MRI 07/24/2013. FINDINGS: Alignment: Straightening of the normal cervical lordosis. No subluxation. Vertebrae: Abnormal edema and enhancement of the C3 and C4 vertebral bodies, concerning for osteomyelitis related to C3-4 diskitis. Solid arthrodesis C5-C6. Cord: There is severe cord compression, C3-C4 due to a combination of disc extrusion and enhancing ventral soft tissue, possible epidural abscess. Canal diameter 4-5 mm. Abnormal cord signal is suspected, reflecting severe compression. Posterior  Fossa, vertebral arteries, paraspinal tissues: Unremarkable. Disc levels: C2-3:  Normal. C3-4: Disc space narrowing. T2 hyperintensity, with regional bone marrow edema, concerning for discitis and osteomyelitis. Central disc extrusion with surrounding enhancement, concerning for epidural abscess. This could measure up to 4 mm in thickness. Severe cord compression, canal diameter 4-5 mm. C4-5:  Disc space narrowing.  No protrusion. C5-6:  Solid arthrodesis. C6-7:  Annular bulge.  No impingement. C7-T1: Central disc extrusion. BILATERAL C8 foraminal narrowing. Slight cord flattening without cord compression. T1-T2: Central and leftward disc extrusion. Slight LEFT-sided cord flattening. LEFT T1 foraminal narrowing. IMPRESSION: C3-4 discal hyperintensity, central extrusion, and enhancing ventral epidural soft tissue along with edematous adjacent vertebrae. 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 Cord compression with suspected abnormal cord signal. Solid appearing C5-6 ACDF. Potentially symptomatic compressive disc pathology at C7-T1, and T1-T2, without signs of infection. These results were called by telephone at the time of interpretation on 05/28/2017 at 1:24 pm to Dr. Sinda Du , who verbally acknowledged these results. Electronically Signed   By: Staci Righter M.D.   On: 05/28/2017 13:25    ROS  Blood pressure (!) 143/121, pulse (!) 101, temperature 98.1 F (36.7 C), temperature source Oral, resp. rate 17, height _0  (1.651 m), weight 88.5 kg (195 lb), SpO2 97 %. Physical Exam  Constitutional: She is oriented to person, place, and time. She appears well-developed and well-nourished. She appears distressed.  HENT:  Head: Normocephalic and atraumatic.  Nose: Nose normal.  Mouth/Throat: Oropharynx is clear and moist.  Eyes: Pupils are equal, round, and reactive to light. Conjunctivae and EOM are normal.  Neck:  Reduced range of motion   Cardiovascular: Regular rhythm, normal heart sounds and intact distal pulses.  tachycardic  Respiratory: Effort normal and breath sounds normal.  GI: Soft. Bowel sounds are normal.  Musculoskeletal: Normal range of motion.  Neurological: She is alert and oriented to person, place, and time. She has normal reflexes. She displays normal reflexes. No cranial nerve deficit or sensory deficit. She exhibits normal muscle tone. Coordination abnormal. She displays no Babinski's sign on the right side. She displays no Babinski's sign on the left side.  Weakness left triceps, biceps, and deltoid 2/5 Normal grip, intrinsics Intact proprioception upper and lower extremities No clonus in feet, + in left hand  Skin: Skin is warm and dry.     Assessment/Plan OR for ACDF at C3/4 to relieve spinal cord compression. The spinal cord already shows abnormal signal. There is a significant discrepancy between the the plain films which show no abnormality in the C3,4 vertebral bodies, and the MRI which was read as showing osteomyelitis. She has acute deltoid, biceps and triceps weakness.  Willem Klingensmith L, MD 05/28/2017, 8:08 PM

## 2017-05-28 NOTE — ED Provider Notes (Signed)
[place] EMERGENCY DEPARTMENT Provider Note   CSN: [id number] Arrival date & time: 05/28/17  1440     History   Chief Complaint Chief Complaint  Patient presents with  . Neck Pain    HPI Jamie Bell is a 47 y.o. female who presents with neck pain. PMH significant for chronic back pain, cirrhosis, diabetes, HTN, MRSA bacteremia, hx of polysubstance abuse (last used around Christmas). Past surgical hx significant for decompressive cervical laminectomy and foraminotomies C7 through T2. She states that she was seen at [facility] last week for neck pain. It was attributed to her "weedeating" however she states that the pain didn't start till days after the weed-eating. She was discharged with a prescription for Amoxil, prednisone, Flexeril. She has been taking this medicine without significant relief. She reports ongoing severe neck pain which is worse with movement. Her voice has been hoarse since Monday. She has worsening left arm weakness and numbness/tingling.   She had an MRI done today which showed C3-4 discal hyperintensity, central extrusion, and enhancing ventral epidural soft tissue along with edematous adjacent vertebrae. 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 Cord compression with suspected abnormal cord signal. Solid appearing C5-6 ACDF. Potentially symptomatic compressive disc pathology at C7-T1, and T1-T2, without signs of infection.  HPI  Past Medical History:  Diagnosis Date  . Chronic back pain   . Cirrhosis ([place])   . Constipation   . DDD (degenerative disc disease)   . Decreased hearing on the right.   F/u by ENT in the past and no further management.  . Depression   . Diabetes mellitus   . Dyspnea    with exertion  . GERD (gastroesophageal reflux disease)   . Hypertension    took med. for HTN, several yrs. ago, no longer needs , seen by Dr. Lattie Haw last yr. during a hosp.  at [facility], for MRSA bacteremia   . MRSA bacteremia 2014   Tx with Vancomycin  . NASH (nonalcoholic steatohepatitis)   . PCOS (polycystic ovarian syndrome)   . Pneumonia 2016  . Polysubstance abuse ([place])   . Sciatica    HNP- Cerv. 7- T2    Patient Active Problem List   Diagnosis Date Noted  . Rectal bleeding 10/31/2015  . Acute non-recurrent maxillary sinusitis 10/31/2015  . Constipation   . Ileus ([place]) 09/29/2015  . Generalized abdominal pain 09/29/2015  . Diarrhea 09/29/2015  . Cirrhosis ([place]) 09/29/2015  . Demand ischemia of myocardium ([place])   . Severe sepsis ([place])   . Sepsis due to pneumonia ([place])   . Type 2 diabetes mellitus with diabetic chronic kidney disease ([place])   . Anxiety state   . Depression   . Chronic back pain   . Cellulitis of right hand   . Spinal stenosis in cervical region 11/22/2013  . Chest wall pain 08/04/2012  . H/O opioid abuse 08/04/2012  . Opiate dependence ([place]) 06/04/2011  . TOBACCO USER 11/03/2008  . OPIOID WITHDRAWAL 10/25/2008  . Backache 10/25/2008  . HYPERTENSION, BENIGN 06/10/2006  . DM (diabetes mellitus), type 2 ([place]) 04/06/2006  . POLYCYSTIC OVARIAN DISEASE 04/06/2006  . BACK PAIN, LUMBAR, CHRONIC 04/06/2006  . ABNORMAL FINDINGS, ELEVATED BP W/O HTN 04/06/2006    Past Surgical History:  Procedure Laterality Date  . BACK SURGERY    . BREAST SURGERY Left    blocked milk ducts   . CARPAL TUNNEL RELEASE    . CATARACT EXTRACTION W/PHACO  Left 02/04/2015   Procedure: CATARACT EXTRACTION PHACO AND INTRAOCULAR LENS PLACEMENT (IOC);  Surgeon: [name], MD;  Location: [facility] ORS;  Service: Ophthalmology;  Laterality: Left;  CDE: 4.11  . CHOLECYSTECTOMY    . COLONOSCOPY WITH PROPOFOL N/A 11/12/2015   Procedure: COLONOSCOPY WITH PROPOFOL;  Surgeon: [name], MD;  Location: [facility] END[facility];  Service: Endoscopy;  Laterality: N/A;  10:15 am  . ESOPHAGEAL BANDING N/A 11/12/2015   Procedure: ESOPHAGEAL BANDING;  Surgeon: [name], MD;   Location: [facility] END[facility];  Service: Endoscopy;  Laterality: N/A;  . ESOPHAGOGASTRODUODENOSCOPY  2007   Dr. Oneida Alar: mild antral erythema. Future procedures need to be done with Propofol per notes.   . ESOPHAGOGASTRODUODENOSCOPY (EGD) WITH PROPOFOL N/A 11/12/2015   Procedure: ESOPHAGOGASTRODUODENOSCOPY (EGD) WITH PROPOFOL;  Surgeon: [name], MD;  Location: [facility];  Service: Endoscopy;  Laterality: N/A;  . INCISION AND DRAINAGE WOUND WITH TENDON REPAIR Right 07/03/2016   Procedure: INCISION AND DRAINAGE RIGHT LONG PROXIMAL INTERPHALANGEAL JOINT WOUND WITH  TENDON REPAIR AND CULTURES;  Surgeon: [name], MD;  Location: [place];  Service: Orthopedics;  Laterality: Right;  . NECK SURGERY    . PERIPHERALLY INSERTED CENTRAL CATHETER INSERTION Right 2007   for treatment with Vancomycin   . POLYPECTOMY  11/12/2015   Procedure: POLYPECTOMY;  Surgeon: [name], MD;  Location[facility];  Service: Endoscopy;;  transverse colon polyp  . POSTERIOR CERVICAL LAMINECTOMY N/A 11/22/2013   Procedure: POSTERIOR CERVICAL LAMINECTOMY C7/T1, T/1-2;  Surgeon: [name], MD;  Location: [place] NEURO ORS;  Service: Neurosurgery;  Laterality: N/A;  POSTERIOR CERVICAL LAMINECTOMY C7/T1, T/1-2  . TEE WITHOUT CARDIOVERSION N/A 08/09/2012   Procedure: TRANSESOPHAGEAL ECHOCARDIOGRAM (TEE);  Surgeon: [name], MD;  Location: [facility];  Service: Cardiovascular;  Laterality: N/A;     OB History    Gravida      Para      Term      Preterm      AB      Living  0     SAB      TAB      Ectopic      Multiple      Live Births               Home Medications    Prior to Admission medications   Medication Sig Start Date End Date Taking? Authorizing Provider  ALPRAZolam Duanne Moron) 0.25 MG tablet Take 1 tablet by mouth 3 (three) times daily as needed. 05/12/17   [provider]  amoxicillin (AMOXIL) 500 MG capsule Take 1 capsule (500 mg total) by mouth 3 (three) times  daily. 05/19/17   Nat Christen, MD  Aspirin-Salicylamide-Caffeine [facility] HEADACHE PO) Take 1 Package by mouth daily as needed.     [provider]  azithromycin (ZITHROMAX) 250 MG tablet Take 2 tablets (500 mg total) by mouth daily. Patient not taking: Reported on 05/19/2017 03/28/17   Julianne Rice, MD  B Complex-Biotin-FA (B-50 COMPLEX PO) Take 1 tablet by mouth daily.    [provider]  Biotin w/ Vitamins C & E (HAIR/SKIN/NAILS PO) Take 1 tablet by mouth daily.    [provider]  Buprenorphine HCl-Naloxone HCl 8-2 MG FILM Place 2 Film under the tongue 2 (two) times daily. 05/18/17   [provider]  CHANTIX STARTING MONTH PAK 0.5 MG X 11 & 1 MG X 42 tablet TK ACCORDING TO KIT INSTRUCTIONS 05/12/17  [provider]  cyclobenzaprine (FLEXERIL) 10 MG tablet Take 1 tablet (10 mg total) by mouth 2 (two) times daily as needed for muscle spasms. 05/19/17   Nat Christen, MD  dicyclomine (BENTYL) 20 MG tablet Take 1 tablet (20 mg total) by mouth 2 (two) times daily as needed for spasms. Patient not taking: Reported on 05/19/2017 03/27/17   Julianne Rice, MD  fluticasone furoate-vilanterol (BREO ELLIPTA) 200-25 MCG/INH AEPB Inhale 1 puff into the lungs daily.    [provider]  ondansetron (ZOFRAN ODT) 4 MG disintegrating tablet 71m ODT q4 hours prn nausea/vomit Patient not taking: Reported on 05/19/2017 03/27/17   YJulianne Rice MD  pantoprazole (PROTONIX) 40 MG tablet Take 1 tablet (40 mg total) by mouth 2 (two) times daily. 10/02/15   HSinda Du MD  pravastatin (PRAVACHOL) 20 MG tablet Take 20 mg by mouth daily.  12/31/14   [provider]  predniSONE (DELTASONE) 10 MG tablet Take 1 tablet (10 mg total) by mouth daily with breakfast. 3 tablets for 3 days, 2 tablets for 3 days, 1 tablet for 3 days. 05/19/17   CNat Christen MD  SPIRIVA HANDIHALER 18 MCG inhalation capsule Place 18 mcg into inhaler and inhale daily as needed (sob).  12/31/14    [provider]  vitamin B-12 (CYANOCOBALAMIN) 250 MCG tablet Take 250 mcg by mouth daily.    [provider]    Family History Family History  Problem Relation Age of Onset  . Hypertension Mother   . Hypertension Father   . Cirrhosis Father        Drank some alcohol  . Diabetes Brother   . Diabetes Other   . Cirrhosis Paternal Aunt        No alcohol  . Cirrhosis Paternal Aunt        No alcohol  . Colon cancer Neg Hx     Social History Social History   Tobacco Use  . Smoking status: Current Every Day Smoker    Packs/day: 1.00    Years: 20.00    Pack years: 20.00    Types: Cigarettes  . Smokeless tobacco: Never Used  Substance Use Topics  . Alcohol use: Yes    Comment: rare  . Drug use: Yes    Types: IV, Marijuana    Comment: prior history of cocaine use, none in 15 years      Allergies   Ciprofloxacin; Nsaids; Shrimp [shellfish allergy]; and Tylenol [acetaminophen]   Review of Systems Review of Systems  Constitutional: Negative for chills and fever.  Respiratory: Negative for shortness of breath.   Cardiovascular: Negative for chest pain.  Gastrointestinal: Negative for abdominal pain.  Musculoskeletal: Positive for back pain and neck pain. Negative for gait problem.  Skin: Negative for wound.  Neurological: Positive for weakness and numbness. Negative for dizziness and headaches.  All other systems reviewed and are negative.    Physical Exam Updated Vital Signs BP (!) 142/100 (BP Location: Right Arm)   Pulse (!) 110   Temp 98.1 F (36.7 C) (Oral)   Resp 16   Ht 5' 5"  (1.651 m)   Wt 88.5 kg (195 lb)   SpO2 98%   BMI 32.45 kg/m   Physical Exam  Constitutional: She is oriented to person, place, and time. She appears well-developed and well-nourished. No distress.  Calm, cooperative. Voice is hoarse  HENT:  Head: Normocephalic and atraumatic.  Eyes: Pupils are equal, round, and reactive to light. Conjunctivae are normal. Right  eye exhibits  no discharge. Left eye exhibits no discharge. No scleral icterus.  Neck:  Prior surgical scar. Significant tenderness over left and right side of the neck and trapezius  Cardiovascular: Regular rhythm. Tachycardia present.  Pulmonary/Chest: Effort normal and breath sounds normal. No respiratory distress.  Abdominal: She exhibits no distension.  Neurological: She is alert and oriented to person, place, and time.  Mental Status:  Alert, oriented, thought content appropriate, able to give a coherent history. Speech fluent without evidence of aphasia. Able to follow 2 step commands without difficulty.  Cranial Nerves:  II:  Peripheral visual fields grossly normal, pupils equal, round, reactive to light III,IV, VI: ptosis not present, extra-ocular motions intact bilaterally  V,VII: smile symmetric, facial light touch sensation equal VIII: hearing grossly normal to voice  X: uvula elevates symmetrically  XI: bilateral shoulder shrug symmetric and strong XII: midline tongue extension without fassiculations Motor:  Normal tone. 5/5 strength in right upper extremity. 4/5 in left upper extremity with slightly weaker grip strength compared to right. 5/5 strength in bilateral lower extremities. Sensory: Subjective decreased sensation over left upper extremity.  Cerebellar: normal finger-to-nose with right upper extremities. Too weak to perform on left side. Gait: normal gait and balance CV: distal pulses palpable throughout    Skin: Skin is warm and dry.  Psychiatric: She has a normal mood and affect. Her behavior is normal.  Nursing note and vitals reviewed.    ED Treatments / Results  Labs (all labs ordered are listed, but only abnormal results are displayed) Labs Reviewed  COMPREHENSIVE METABOLIC PANEL - Abnormal; Notable for the following components:      Result Value   Sodium 132 (*)    Chloride 96 (*)    Glucose, Bld 339 (*)    BUN 30 (*)    Creatinine, Ser 1.21 (*)     Total Protein 8.7 (*)    GFR calc non Af Amer 53 (*)    All other components within normal limits  CBC WITH DIFFERENTIAL/PLATELET - Abnormal; Notable for the following components:   WBC 17.7 (*)    Hemoglobin 15.2 (*)    All other components within normal limits  I-STAT CG4 LACTIC ACID, ED - Abnormal; Notable for the following components:   Lactic Acid, Venous 2.73 (*)    All other components within normal limits  CULTURE, BLOOD (ROUTINE X 2)  CULTURE, BLOOD (ROUTINE X 2)  PROTIME-INR  I-STAT BETA HCG BLOOD, ED ([facility], [facility], [facility] ONLY)    EKG None  Radiology Mr Cervical Spine W Wo Contrast  Result Date: 05/28/2017 CLINICAL DATA:  Neck pain. Back pain. BILATERAL lower extremity pain and weakness and numbness for the past week. Difficulty moving LEFT arm. No known injury. Previous cervical fusion. EXAM: MRI CERVICAL SPINE WITHOUT AND WITH CONTRAST TECHNIQUE: Multiplanar and multiecho pulse sequences of the cervical spine, to include the craniocervical junction and cervicothoracic junction, were obtained without and with intravenous contrast. CONTRAST:  27m MULTIHANCE GADOBENATE DIMEGLUMINE 529 MG/ML IV SOLN COMPARISON:  MRI 07/24/2013. FINDINGS: Alignment: Straightening of the normal cervical lordosis. No subluxation. Vertebrae: Abnormal edema and enhancement of the C3 and C4 vertebral bodies, concerning for osteomyelitis related to C3-4 diskitis. Solid arthrodesis C5-C6. Cord: There is severe cord compression, C3-C4 due to a combination of disc extrusion and enhancing ventral soft tissue, possible epidural abscess. Canal diameter 4-5 mm. Abnormal cord signal is suspected, reflecting severe compression. Posterior Fossa, vertebral arteries, paraspinal tissues: Unremarkable. Disc levels: C2-3:  Normal. C3-4: Disc space narrowing. T2  hyperintensity, with regional bone marrow edema, concerning for discitis and osteomyelitis. Central disc extrusion with surrounding enhancement, concerning for epidural  abscess. This could measure up to 4 mm in thickness. Severe cord compression, canal diameter 4-5 mm. C4-5:  Disc space narrowing.  No protrusion. C5-6:  Solid arthrodesis. C6-7:  Annular bulge.  No impingement. C7-T1: Central disc extrusion. BILATERAL C8 foraminal narrowing. Slight cord flattening without cord compression. T1-T2: Central and leftward disc extrusion. Slight LEFT-sided cord flattening. LEFT T1 foraminal narrowing. IMPRESSION: C3-4 discal hyperintensity, central extrusion, and enhancing ventral epidural soft tissue along with edematous adjacent vertebrae. 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 Cord compression with suspected abnormal cord signal. Solid appearing C5-6 ACDF. Potentially symptomatic compressive disc pathology at C7-T1, and T1-T2, without signs of infection. These results were called by telephone at the time of interpretation on 05/28/2017 at 1:24 pm to Dr. Sinda Du , who verbally acknowledged these results. Electronically Signed   By: Staci Righter M.D.   On: 05/28/2017 13:25    Procedures Procedures (including critical care time)  CRITICAL CARE Performed by: Recardo Evangelist   Total critical care time: 30 minutes  Critical care time was exclusive of separately billable procedures and treating other patients.  Critical care was necessary to treat or prevent imminent or life-threatening deterioration.  Critical care was time spent personally by me on the following activities: development of treatment plan with patient and/or surrogate as well as nursing, discussions with consultants, evaluation of patient's response to treatment, examination of patient, obtaining history from patient or surrogate, ordering and performing treatments and interventions, ordering and review of laboratory studies, ordering and review of radiographic studies, pulse oximetry and re-evaluation of patient's  condition.  Medications Ordered in ED Medications - No data to display   Initial Impression / Assessment and Plan / ED Course  I have reviewed the triage vital signs and the nursing notes.  Pertinent labs & imaging results that were available during my care of the patient were reviewed by me and considered in my medical decision making (see chart for details).  47 year old female presents with MRI findings concerning for osteomyelitis and diskitis with cord compression. She is tachycardic but otherwise vitals are normal. On exam she has left sided arm weakness. CBC is remarkable for leukocytosis of 17.7 which may be combination of reactive from steroids and infection. Lactic acid is elevated. BMP is remarkable for hyponatremia, hypochloremia, hyperglycemia (339), elevated BUN/SCr. Vancomycin and Zosyn were started. I spoke with Dr. Cyndy Freeze who will come to see the patient.   Final Clinical Impressions(s) / ED Diagnoses   Final diagnoses:  Osteomyelitis of cervical spine [facility])  Cord compression [facility])    ED Discharge Orders    None       Recardo Evangelist, PA-C 05/29/17 Ofilia Neas    Virgel Manifold, MD 06/01/17 1043

## 2017-05-28 NOTE — ED Triage Notes (Signed)
Received call from patient's neurologist, sent here for further evaluation of discitis (cervical) and worried about possible cord compression.  MRI done earlier today.

## 2017-05-28 NOTE — ED Triage Notes (Signed)
Pt endorses cervical pain, hoarsness and left arm pain/weakness x 12 days. Pt went to [facility] last Tuesday and was sent home with antibiotics and prednisone. Pt received phone call this morning stating that pt had infection in disc and to come to [facility] to see neurosurgeon.

## 2017-05-28 NOTE — Anesthesia Procedure Notes (Signed)
Procedure Name: Intubation Date/Time: 05/28/2017 9:26 PM Performed by: Valetta Fuller, CRNA Pre-anesthesia Checklist: Emergency Drugs available, Patient identified, Suction available and Patient being monitored Patient Re-evaluated:Patient Re-evaluated prior to induction Oxygen Delivery Method: Circle system utilized Preoxygenation: Pre-oxygenation with 100% oxygen Induction Type: IV induction and Rapid sequence Laryngoscope Size: Glidescope Grade View: Grade I Tube type: Oral Tube size: 7.5 mm Number of attempts: 1 Airway Equipment and Method: Stylet Placement Confirmation: ETT inserted through vocal cords under direct vision,  positive ETCO2 and breath sounds checked- equal and bilateral Secured at: 23 cm Tube secured with: Tape Dental Injury: Teeth and Oropharynx as per pre-operative assessment

## 2017-05-28 NOTE — ED Notes (Signed)
Informed triage RN Katlynne of lactic acid 2.73

## 2017-05-28 NOTE — Progress Notes (Signed)
Pharmacy Antibiotic Note  Jamie Bell is a 47 y.o. female referred by her neurologist for further work up for suspected discitis. Pharmacy consulted for vancomycin and Zosyn dosing. WBC 17.7, LA 2.73, currently AF. Scr 1.21 (BL < 1), estimated CrCl ~37 mL/min.  Plan: Vancomycin 1256m IV q24h. Goal trough 15-20 mcg/mL. Zosyn 3.375g IV q8h (4 hour infusion). F/u C&S, clinical status, renal function, de-escalation, LOT, vancomycin levels as indicated  Height: 5' 5"  (165.1 cm) Weight: 195 lb (88.5 kg) IBW/kg (Calculated) : 57  Temp (24hrs), Avg:98.1 F (36.7 C), Min:98.1 F (36.7 C), Max:98.1 F (36.7 C)  Recent Labs  Lab 05/28/17 1510 05/28/17 1522  WBC 17.7*  --   CREATININE 1.21*  --   LATICACIDVEN  --  2.73*    Estimated Creatinine Clearance: 64.5 mL/min (A) (by C-G formula based on SCr of 1.21 mg/dL (H)).    Allergies  Allergen Reactions  . Ciprofloxacin Swelling  . Nsaids Hives  . Shrimp [Shellfish Allergy]     Makes hands itch   . Tylenol [Acetaminophen] Other (See Comments)    Patient has cirrhosis of the liver and this causes elevated levels   Antimicrobials this admission: Vanc 5/3 >> Zosyn 5/3 >>  Dose adjustments this admission: None  Microbiology results: 5/3 BCx: pending  Thank you for allowing pharmacy to be a part of this patient's care.  EMila MerryDGerarda Fraction PharmD PGY1 Pharmacy Resident Pager: [phone]/03/2017 4:25 PM

## 2017-05-28 NOTE — Anesthesia Preprocedure Evaluation (Addendum)
Anesthesia Evaluation  Patient identified by MRN, date of birth, ID band Patient awake    Reviewed: Allergy & Precautions, NPO status , Patient's Chart, lab work & pertinent test results  Airway Mallampati: I  TM Distance: >3 FB Neck ROM: Full    Dental  (+) Poor Dentition, Missing, Chipped,    Pulmonary Current Smoker,     + decreased breath sounds      Cardiovascular hypertension,  Rhythm:Regular Rate:Normal     Neuro/Psych PSYCHIATRIC DISORDERS Anxiety Depression  Neuromuscular disease    GI/Hepatic GERD  Medicated,(+) Cirrhosis       , Hepatitis -  Endo/Other  diabetes  Renal/GU      Musculoskeletal   Abdominal (+) + obese,   Peds  Hematology   Anesthesia Other Findings   Reproductive/Obstetrics                            Lab Results  Component Value Date   WBC 17.7 (H) 05/28/2017   HGB 15.2 (H) 05/28/2017   HCT 44.5 05/28/2017   MCV 93.1 05/28/2017   PLT 217 05/28/2017   Lab Results  Component Value Date   CREATININE 1.21 (H) 05/28/2017   BUN 30 (H) 05/28/2017   NA 132 (L) 05/28/2017   K 4.6 05/28/2017   CL 96 (L) 05/28/2017   CO2 24 05/28/2017   Lab Results  Component Value Date   INR 1.20 05/28/2017   INR 1.15 03/27/2017   INR 0.99 10/01/2015     Anesthesia Physical Anesthesia Plan  ASA: III and emergent  Anesthesia Plan: General   Post-op Pain Management:    Induction: Intravenous  PONV Risk Score and Plan: 3 and Ondansetron, Dexamethasone and Midazolam  Airway Management Planned: Oral ETT and Video Laryngoscope Planned  Additional Equipment: None  Intra-op Plan:   Post-operative Plan: Extubation in OR  Informed Consent: I have reviewed the patients History and Physical, chart, labs and discussed the procedure including the risks, benefits and alternatives for the proposed anesthesia with the patient or authorized representative who has indicated  his/her understanding and acceptance.   Dental advisory given  Plan Discussed with: CRNA  Anesthesia Plan Comments:        Anesthesia Quick Evaluation

## 2017-05-29 ENCOUNTER — Emergency Department (HOSPITAL_COMMUNITY): Payer: Medicare Other

## 2017-05-29 DIAGNOSIS — M6281 Muscle weakness (generalized): Secondary | ICD-10-CM | POA: Diagnosis not present

## 2017-05-29 DIAGNOSIS — I129 Hypertensive chronic kidney disease with stage 1 through stage 4 chronic kidney disease, or unspecified chronic kidney disease: Secondary | ICD-10-CM | POA: Diagnosis present

## 2017-05-29 DIAGNOSIS — F1120 Opioid dependence, uncomplicated: Secondary | ICD-10-CM | POA: Diagnosis present

## 2017-05-29 DIAGNOSIS — A4902 Methicillin resistant Staphylococcus aureus infection, unspecified site: Secondary | ICD-10-CM | POA: Diagnosis not present

## 2017-05-29 DIAGNOSIS — F319 Bipolar disorder, unspecified: Secondary | ICD-10-CM | POA: Diagnosis not present

## 2017-05-29 DIAGNOSIS — M4802 Spinal stenosis, cervical region: Secondary | ICD-10-CM | POA: Diagnosis not present

## 2017-05-29 DIAGNOSIS — G061 Intraspinal abscess and granuloma: Secondary | ICD-10-CM | POA: Diagnosis present

## 2017-05-29 DIAGNOSIS — R450 Nervousness: Secondary | ICD-10-CM | POA: Diagnosis not present

## 2017-05-29 DIAGNOSIS — N189 Chronic kidney disease, unspecified: Secondary | ICD-10-CM | POA: Diagnosis present

## 2017-05-29 DIAGNOSIS — Z91013 Allergy to seafood: Secondary | ICD-10-CM | POA: Diagnosis not present

## 2017-05-29 DIAGNOSIS — M4803 Spinal stenosis, cervicothoracic region: Secondary | ICD-10-CM | POA: Diagnosis not present

## 2017-05-29 DIAGNOSIS — F329 Major depressive disorder, single episode, unspecified: Secondary | ICD-10-CM | POA: Diagnosis present

## 2017-05-29 DIAGNOSIS — F1990 Other psychoactive substance use, unspecified, uncomplicated: Secondary | ICD-10-CM | POA: Diagnosis not present

## 2017-05-29 DIAGNOSIS — E282 Polycystic ovarian syndrome: Secondary | ICD-10-CM | POA: Diagnosis present

## 2017-05-29 DIAGNOSIS — R531 Weakness: Secondary | ICD-10-CM | POA: Diagnosis not present

## 2017-05-29 DIAGNOSIS — B957 Other staphylococcus as the cause of diseases classified elsewhere: Secondary | ICD-10-CM | POA: Diagnosis present

## 2017-05-29 DIAGNOSIS — B379 Candidiasis, unspecified: Secondary | ICD-10-CM | POA: Diagnosis not present

## 2017-05-29 DIAGNOSIS — K7460 Unspecified cirrhosis of liver: Secondary | ICD-10-CM | POA: Diagnosis present

## 2017-05-29 DIAGNOSIS — E119 Type 2 diabetes mellitus without complications: Secondary | ICD-10-CM | POA: Diagnosis not present

## 2017-05-29 DIAGNOSIS — M4622 Osteomyelitis of vertebra, cervical region: Secondary | ICD-10-CM | POA: Diagnosis not present

## 2017-05-29 DIAGNOSIS — E1122 Type 2 diabetes mellitus with diabetic chronic kidney disease: Secondary | ICD-10-CM | POA: Diagnosis present

## 2017-05-29 DIAGNOSIS — F17210 Nicotine dependence, cigarettes, uncomplicated: Secondary | ICD-10-CM | POA: Diagnosis present

## 2017-05-29 DIAGNOSIS — Z79899 Other long term (current) drug therapy: Secondary | ICD-10-CM | POA: Diagnosis not present

## 2017-05-29 DIAGNOSIS — K7581 Nonalcoholic steatohepatitis (NASH): Secondary | ICD-10-CM | POA: Diagnosis present

## 2017-05-29 DIAGNOSIS — Z6832 Body mass index (BMI) 32.0-32.9, adult: Secondary | ICD-10-CM | POA: Diagnosis not present

## 2017-05-29 DIAGNOSIS — E1169 Type 2 diabetes mellitus with other specified complication: Secondary | ICD-10-CM | POA: Diagnosis not present

## 2017-05-29 DIAGNOSIS — R7881 Bacteremia: Secondary | ICD-10-CM | POA: Diagnosis not present

## 2017-05-29 DIAGNOSIS — Z981 Arthrodesis status: Secondary | ICD-10-CM | POA: Diagnosis not present

## 2017-05-29 DIAGNOSIS — F419 Anxiety disorder, unspecified: Secondary | ICD-10-CM | POA: Diagnosis present

## 2017-05-29 DIAGNOSIS — G8929 Other chronic pain: Secondary | ICD-10-CM | POA: Diagnosis present

## 2017-05-29 DIAGNOSIS — Z881 Allergy status to other antibiotic agents status: Secondary | ICD-10-CM | POA: Diagnosis not present

## 2017-05-29 DIAGNOSIS — F1121 Opioid dependence, in remission: Secondary | ICD-10-CM | POA: Diagnosis not present

## 2017-05-29 DIAGNOSIS — Z4789 Encounter for other orthopedic aftercare: Secondary | ICD-10-CM | POA: Diagnosis not present

## 2017-05-29 DIAGNOSIS — Z7984 Long term (current) use of oral hypoglycemic drugs: Secondary | ICD-10-CM | POA: Diagnosis not present

## 2017-05-29 DIAGNOSIS — K219 Gastro-esophageal reflux disease without esophagitis: Secondary | ICD-10-CM | POA: Diagnosis present

## 2017-05-29 DIAGNOSIS — Z888 Allergy status to other drugs, medicaments and biological substances status: Secondary | ICD-10-CM | POA: Diagnosis not present

## 2017-05-29 DIAGNOSIS — G062 Extradural and subdural abscess, unspecified: Secondary | ICD-10-CM | POA: Diagnosis not present

## 2017-05-29 DIAGNOSIS — K760 Fatty (change of) liver, not elsewhere classified: Secondary | ICD-10-CM | POA: Diagnosis not present

## 2017-05-29 DIAGNOSIS — H9191 Unspecified hearing loss, right ear: Secondary | ICD-10-CM | POA: Diagnosis present

## 2017-05-29 DIAGNOSIS — M5023 Other cervical disc displacement, cervicothoracic region: Secondary | ICD-10-CM | POA: Diagnosis present

## 2017-05-29 DIAGNOSIS — M4642 Discitis, unspecified, cervical region: Secondary | ICD-10-CM | POA: Diagnosis not present

## 2017-05-29 DIAGNOSIS — E669 Obesity, unspecified: Secondary | ICD-10-CM | POA: Diagnosis present

## 2017-05-29 DIAGNOSIS — Z886 Allergy status to analgesic agent status: Secondary | ICD-10-CM | POA: Diagnosis not present

## 2017-05-29 LAB — MRSA PCR SCREENING: MRSA BY PCR: NEGATIVE

## 2017-05-29 LAB — GLUCOSE, CAPILLARY: GLUCOSE-CAPILLARY: 144 mg/dL — AB (ref 65–99)

## 2017-05-29 MED ORDER — PANTOPRAZOLE SODIUM 40 MG PO TBEC
40.0000 mg | DELAYED_RELEASE_TABLET | Freq: Two times a day (BID) | ORAL | Status: DC
  Administered 2017-05-29 – 2017-06-04 (×13): 40 mg via ORAL
  Filled 2017-05-29 (×13): qty 1

## 2017-05-29 MED ORDER — GABAPENTIN 300 MG PO CAPS
300.0000 mg | ORAL_CAPSULE | Freq: Three times a day (TID) | ORAL | Status: DC
  Administered 2017-05-29 – 2017-06-04 (×19): 300 mg via ORAL
  Filled 2017-05-29 (×19): qty 1

## 2017-05-29 MED ORDER — ZOLPIDEM TARTRATE 5 MG PO TABS
5.0000 mg | ORAL_TABLET | Freq: Every evening | ORAL | Status: DC | PRN
  Administered 2017-05-31 – 2017-06-03 (×3): 5 mg via ORAL
  Filled 2017-05-29 (×4): qty 1

## 2017-05-29 MED ORDER — ACETAMINOPHEN 650 MG RE SUPP: 650.0000 mg | RECTAL | Status: DC | PRN

## 2017-05-29 MED ORDER — HYDROMORPHONE HCL 2 MG/ML IJ SOLN
INTRAMUSCULAR | Status: AC
  Filled 2017-05-29: qty 1

## 2017-05-29 MED ORDER — POTASSIUM CHLORIDE IN NACL 20-0.9 MEQ/L-% IV SOLN
INTRAVENOUS | Status: DC
  Filled 2017-05-29: qty 1000

## 2017-05-29 MED ORDER — MORPHINE SULFATE (PF) 2 MG/ML IV SOLN: 1.0000 mg | INTRAVENOUS | Status: DC | PRN

## 2017-05-29 MED ORDER — SODIUM CHLORIDE 0.9% FLUSH
3.0000 mL | Freq: Two times a day (BID) | INTRAVENOUS | Status: DC
  Administered 2017-05-29: 3 mL via INTRAVENOUS

## 2017-05-29 MED ORDER — CANAGLIFLOZIN-METFORMIN HCL 150-1000 MG PO TABS: 1.0000 | ORAL_TABLET | Freq: Two times a day (BID) | ORAL | Status: DC

## 2017-05-29 MED ORDER — METFORMIN HCL 500 MG PO TABS
1000.0000 mg | ORAL_TABLET | Freq: Two times a day (BID) | ORAL | Status: DC
  Administered 2017-05-29 – 2017-06-04 (×13): 1000 mg via ORAL
  Filled 2017-05-29 (×13): qty 2

## 2017-05-29 MED ORDER — NICOTINE 21 MG/24HR TD PT24
21.0000 mg | MEDICATED_PATCH | Freq: Every day | TRANSDERMAL | Status: DC
  Administered 2017-05-29 – 2017-06-04 (×7): 21 mg via TRANSDERMAL
  Filled 2017-05-29 (×7): qty 1

## 2017-05-29 MED ORDER — ONDANSETRON HCL 4 MG PO TABS: 4.0000 mg | ORAL_TABLET | Freq: Four times a day (QID) | ORAL | Status: DC | PRN

## 2017-05-29 MED ORDER — CYCLOBENZAPRINE HCL 10 MG PO TABS
10.0000 mg | ORAL_TABLET | Freq: Two times a day (BID) | ORAL | Status: DC | PRN
  Administered 2017-05-30 – 2017-06-03 (×6): 10 mg via ORAL
  Filled 2017-05-29 (×8): qty 1

## 2017-05-29 MED ORDER — SODIUM CHLORIDE 0.9% FLUSH: 3.0000 mL | INTRAVENOUS | Status: DC | PRN

## 2017-05-29 MED ORDER — MAGNESIUM CITRATE PO SOLN: 1.0000 | Freq: Once | ORAL | Status: DC | PRN

## 2017-05-29 MED ORDER — SODIUM CHLORIDE 0.9 % IV SOLN
2.0000 g | INTRAVENOUS | Status: DC
  Administered 2017-05-29 – 2017-06-01 (×4): 2 g via INTRAVENOUS
  Filled 2017-05-29 (×5): qty 20

## 2017-05-29 MED ORDER — ACETAMINOPHEN 325 MG PO TABS
650.0000 mg | ORAL_TABLET | ORAL | Status: DC | PRN
  Administered 2017-05-30 – 2017-06-03 (×7): 650 mg via ORAL
  Filled 2017-05-29 (×8): qty 2

## 2017-05-29 MED ORDER — CANAGLIFLOZIN 300 MG PO TABS
300.0000 mg | ORAL_TABLET | Freq: Every day | ORAL | Status: DC
  Administered 2017-05-29 – 2017-06-04 (×7): 300 mg via ORAL
  Filled 2017-05-29 (×7): qty 1

## 2017-05-29 MED ORDER — PHENOL 1.4 % MT LIQD
1.0000 | OROMUCOSAL | Status: DC | PRN
  Administered 2017-05-30: 1 via OROMUCOSAL
  Filled 2017-05-29: qty 177

## 2017-05-29 MED ORDER — TIOTROPIUM BROMIDE MONOHYDRATE 18 MCG IN CAPS: 18.0000 ug | ORAL_CAPSULE | Freq: Every day | RESPIRATORY_TRACT | Status: DC | PRN

## 2017-05-29 MED ORDER — MENTHOL 3 MG MT LOZG
1.0000 | LOZENGE | OROMUCOSAL | Status: DC | PRN
  Filled 2017-05-29 (×2): qty 9

## 2017-05-29 MED ORDER — ALBUTEROL SULFATE HFA 108 (90 BASE) MCG/ACT IN AERS
INHALATION_SPRAY | RESPIRATORY_TRACT | Status: DC | PRN
  Administered 2017-05-29: 4 via RESPIRATORY_TRACT

## 2017-05-29 MED ORDER — BISACODYL 5 MG PO TBEC
5.0000 mg | DELAYED_RELEASE_TABLET | Freq: Every day | ORAL | Status: DC | PRN
Start: 2017-05-29 — End: 2017-06-04
  Administered 2017-06-02: 5 mg via ORAL
  Filled 2017-05-29: qty 1

## 2017-05-29 MED ORDER — SODIUM CHLORIDE 0.9 % IV SOLN: 250.0000 mL | INTRAVENOUS | Status: DC

## 2017-05-29 MED ORDER — OXYCODONE HCL 5 MG PO TABS
5.0000 mg | ORAL_TABLET | ORAL | Status: DC | PRN
  Administered 2017-05-30 – 2017-06-04 (×2): 5 mg via ORAL
  Filled 2017-05-29: qty 1

## 2017-05-29 MED ORDER — FLUTICASONE FUROATE-VILANTEROL 200-25 MCG/INH IN AEPB
1.0000 | INHALATION_SPRAY | Freq: Every day | RESPIRATORY_TRACT | Status: DC
  Administered 2017-05-29 – 2017-06-04 (×7): 1 via RESPIRATORY_TRACT
  Filled 2017-05-29: qty 28

## 2017-05-29 MED ORDER — OXYCODONE HCL 5 MG PO TABS
10.0000 mg | ORAL_TABLET | ORAL | Status: DC | PRN
  Administered 2017-05-29 – 2017-06-04 (×26): 10 mg via ORAL
  Filled 2017-05-29 (×27): qty 2

## 2017-05-29 MED ORDER — MORPHINE SULFATE (PF) 4 MG/ML IV SOLN
1.0000 mg | INTRAVENOUS | Status: DC | PRN
  Administered 2017-05-29 – 2017-06-01 (×4): 1 mg via INTRAVENOUS
  Filled 2017-05-29 (×4): qty 1

## 2017-05-29 MED ORDER — ONDANSETRON HCL 4 MG/2ML IJ SOLN: 4.0000 mg | Freq: Four times a day (QID) | INTRAMUSCULAR | Status: DC | PRN

## 2017-05-29 MED ORDER — VANCOMYCIN HCL IN DEXTROSE 1-5 GM/200ML-% IV SOLN
1000.0000 mg | Freq: Two times a day (BID) | INTRAVENOUS | Status: DC
  Administered 2017-05-29 – 2017-06-04 (×12): 1000 mg via INTRAVENOUS
  Filled 2017-05-29 (×14): qty 200

## 2017-05-29 MED ORDER — PRAVASTATIN SODIUM 20 MG PO TABS
20.0000 mg | ORAL_TABLET | Freq: Every day | ORAL | Status: DC
  Administered 2017-05-29 – 2017-06-03 (×6): 20 mg via ORAL
  Filled 2017-05-29 (×6): qty 1

## 2017-05-29 MED ORDER — DOCUSATE SODIUM 100 MG PO CAPS
100.0000 mg | ORAL_CAPSULE | Freq: Two times a day (BID) | ORAL | Status: DC
  Administered 2017-05-29 – 2017-06-04 (×13): 100 mg via ORAL
  Filled 2017-05-29 (×13): qty 1

## 2017-05-29 MED ORDER — SENNOSIDES-DOCUSATE SODIUM 8.6-50 MG PO TABS
1.0000 | ORAL_TABLET | Freq: Every evening | ORAL | Status: DC | PRN
Start: 2017-05-29 — End: 2017-06-04

## 2017-05-29 MED ORDER — OXYCODONE HCL ER 10 MG PO T12A
10.0000 mg | EXTENDED_RELEASE_TABLET | Freq: Two times a day (BID) | ORAL | Status: DC
  Administered 2017-05-29 – 2017-06-04 (×13): 10 mg via ORAL
  Filled 2017-05-29 (×13): qty 1

## 2017-05-29 NOTE — Op Note (Addendum)
05/28/2017 - 05/29/2017  1:06 AM  PATIENT:  Jamie Bell  47 y.o. female with severe weakness in the left deltoid, biceps, and triceps. Mri strongly suggests that she has osteomyelitis, and possible epidural abscess centered at C3/4. She is stenotic, and has abnormal spinal cord signal. I will take her to the operating room for an emergent acdf.  PRE-OPERATIVE DIAGNOSIS:  C3-C4  POST-OPERATIVE DIAGNOSIS: Cervical epidural abscess, cervical stenosis  PROCEDURE:  Anterior Cervical decompression C3/4 Arthrodesis C3-4 with 51m structural allograft Anterior instrumentation(Nuvasive) C3-4  SURGEON:   Surgeon(s): CAshok Pall MD   ASSISTANTS:none  ANESTHESIA:   general  EBL:  Total I/O In: 1000 [I.V.:1000] Out: -   BLOOD ADMINISTERED:none  CELL SAVER GIVEN:none  COUNT:per nursing  DRAINS: none   SPECIMEN:  No Specimen  DICTATION: Jamie Bell taken to the operating room, intubated, and placed under general anesthesia without difficulty. She was positioned supine with her head in slight extension on a horseshoe headrest. The neck was prepped and draped in a sterile manner. I infiltrated 3 cc's 1/2%lidocaine/1:200,000 strength epinephrine into the planned incision starting from the midline to the medial border of the right sternocleidomastoid muscle. I opened the incision with a 10 blade and dissected sharply through soft tissue to the platysma. I dissected in the plane superior to the platysma both rostrally and caudally. I then opened the platysma in a horizontal fashion with Metzenbaum scissors, and dissected in the inferior plane rostrally and caudally. With both blunt and sharp technique I created an avascular corridor to the cervical spine. There was a great deal of abnormal, thick, whitish tissue overlying the cervical spine. While the longus colli muscles on the left side were being retracted, pus emanated from the muscles. I cultured this material and continued my  dissection. I eventually was able to expose the C3/4 disc space.I placed a spinal needle(s) in the disc space at C3/4 . I then reflected the longus colli from C3 to C4 and placed self retaining retractors. I opened the disc space(s) at C3/4 with a 15 blade. I removed disc with curettes, and sent specimens for culture, Kerrison punches, and the drill. Using the drill I removed osteophytes and prepared for the decompression. I encountered pus again in the epidural space, where an abscess capsule had formed. I decompressed the spinal canal, exposed the dura and the C4 root(s) with the drill, Kerrison punches, and the curettes. I used the microscope to aid in microdissection. I removed the posterior longitudinal ligament to fully expose and decompress the thecal sac. I exposed the roots laterally taking down the C3/4 uncovertebral joints. With the decompression complete I moved on to the arthrodesis. I used the drill to level the surfaces of C3, and 4. I removed soft tissue to prepare the disc space and the bony surfaces. I measured the space and placed a 776mstructural allograft into the disc space.  I then placed the anterior instrumentation. I placed 2 screws in each vertebral body through the plate. I locked the screws into place. Intraoperative xray showed the graft, plate, and screws to be in good position. I irrigated the wound, achieved hemostasis, and closed the wound in layers. I approximated the platysma, and the subcuticular plane with vicryl sutures. I used Dermabond for a sterile dressing.   PLAN OF CARE: Admit to inpatient   PATIENT DISPOSITION:  PACU - hemodynamically stable.   Delay start of Pharmacological VTE agent (>24hrs) due to surgical blood loss or risk of bleeding:  yes

## 2017-05-29 NOTE — Progress Notes (Signed)
1900: Handoff report received from RN. Pt sitting on the side of the bed on the phone with her husband. Discussed plan of care for the shift; pt amenable to plan.  0000: Pt resting comfortably.  0400: Pt continues resting comfortably.  0700: Handoff report given to RN. No acute events overnight.

## 2017-05-29 NOTE — Progress Notes (Signed)
Pharmacy Antibiotic Note  Jamie Bell is a 47 y.o. female admitted on 05/28/2017  With possible osteo of back  Plan: Ceftriaxone 2 g q24h Vanc 1 g q12h Monitor renal fx cx vt prn  Height: 5' 5"  (165.1 cm) Weight: 195 lb (88.5 kg) IBW/kg (Calculated) : 57  Temp (24hrs), Avg:98.1 F (36.7 C), Min:97.5 F (36.4 C), Max:98.6 F (37 C)  Recent Labs  Lab 05/28/17 1510 05/28/17 1522 05/28/17 1716  WBC 17.7*  --   --   CREATININE 1.21*  --   --   LATICACIDVEN  --  2.73* 1.95*    Estimated Creatinine Clearance: 64.5 mL/min (A) (by C-G formula based on SCr of 1.21 mg/dL (H)).    Allergies  Allergen Reactions  . Chantix [Varenicline] Other (See Comments)    Vivid nightmares  . Ciprofloxacin Swelling  . Nsaids Hives    Pt takes ibuprofen with no reaction  . Shrimp [Shellfish Allergy] Itching    Makes hands itch   . Tylenol [Acetaminophen] Other (See Comments)    Patient has cirrhosis of the liver and this causes elevated levels   Levester Fresh, PharmD, BCPS, BCCCP Clinical Pharmacist Clinical phone for 05/29/2017 from 0830 - 2100: [phone] If after 2100, please call main pharmacy at: [phone] 05/29/2017 6:25 PM

## 2017-05-29 NOTE — Progress Notes (Signed)
Patient ID: Jamie Bell, female   DOB: [birth year], 47 y.o.   MRN: [id number] Vital signs are stable Motor function is intact on the right but marked weakness noted on the left Patient's neck feels somewhat better She is having some difficulty with swallowing Desires to smoke Will order nicotine patch Stable postop day 1

## 2017-05-29 NOTE — Evaluation (Signed)
Physical Therapy Evaluation Patient Details Name: Jamie Bell MRN: [id number] DOB: Nov 24, 1970 Today's Date: 05/29/2017   History of Present Illness  Patient is a 47 yo female with DM who presented for neck pain found to have cervical epidural abscess and stenosis. Patient is now s/p ACDF C3/4.  Clinical Impression  Orders received for PT evaluation. Patient demonstrates deficits in functional mobility as indicated below. Will benefit from continued skilled PT to address deficits and maximize function. Will see as indicated and progress as tolerated.  At this time, patient demonstrating limited ability to safely mobilize due to LE deficits and significant UE deficits. 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 At this time, recommend CIR upon acute discharge to regain independence and functional status.     Follow Up Recommendations CIR;Supervision/Assistance - 24 hour    Equipment Recommendations  (TBD)    Recommendations for Other Services       Precautions / Restrictions Precautions Precautions: Cervical Precaution Booklet Issued: Yes (comment) Precaution Comments: verbally reviewed      Mobility  Bed Mobility Overal bed mobility: Needs Assistance Bed Mobility: Supine to Sit     Supine to sit: Min assist     General bed mobility comments: increased time and effort, min assist to position RUE and power up to sitting  Transfers Overall transfer level: Needs assistance   Transfers: Sit to/from Stand Sit to Stand: Min assist         General transfer comment: min assist during power up to standing  Ambulation/Gait Ambulation/Gait assistance: Min assist;Mod assist Ambulation Distance (Feet): 50 Feet Assistive device: 1 person hand held assist Gait Pattern/deviations: Step-through pattern;Decreased stride  length;Shuffle;Drifts right/left Gait velocity: decreased   General Gait Details: Noted instability and LE buckling   Stairs            Wheelchair Mobility    Modified Rankin (Stroke Patients Only)       Balance Overall balance assessment: Needs assistance   Sitting balance-Leahy Scale: Fair Sitting balance - Comments: able to sit EOB      Standing balance-Leahy Scale: Poor Standing balance comment: reliance on UE support                             Pertinent Vitals/Pain Pain Assessment: 0-10 Pain Score: 6  Pain Location: neck, throat, legs and Bell Pain Descriptors / Indicators: Discomfort;Guarding;Sore;Pins and needles Pain Intervention(s): Monitored during session;Repositioned    Home Living Family/patient expects to be discharged to:: Private residence Living Arrangements: Spouse/significant other Available Help at Discharge: Family Type of Home: House Home Access: Stairs to enter Entrance Stairs-Rails: None Technical brewer of Steps: 3 Home Layout: One level Home Equipment: None      Prior Function Level of Independence: Independent               Hand Dominance   Dominant Hand: Right    Extremity/Trunk Assessment   Upper Extremity Assessment Upper Extremity Assessment: Defer to OT evaluation    Lower Extremity Assessment Lower Extremity Assessment: LLE deficits/detail RLE Deficits / Details: noted significant assymetry LLE compared to RLE, gross strength 3+/5 limited ability to take on resistance LLE Deficits / Details: noted significant assymetry LLE compared to RLE, gross strength 3+/5 limited ability to take on resistance LLE Sensation: decreased light touch;decreased proprioception;history of peripheral neuropathy LLE Coordination: decreased  fine motor;decreased gross motor    Cervical / Trunk Assessment Cervical / Trunk Assessment: (s/p cervical surgery)  Communication   Communication: No difficulties   Cognition Arousal/Alertness: Awake/alert Behavior During Therapy: WFL for tasks assessed/performed Overall Cognitive Status: Within Functional Limits for tasks assessed                                        General Comments      Exercises     Assessment/Plan    PT Assessment Patient needs continued PT services  PT Problem List Decreased strength;Decreased activity tolerance;Decreased balance;Decreased mobility;Decreased coordination;Impaired sensation;Pain       PT Treatment Interventions DME instruction;Gait training;Stair training;Functional mobility training;Therapeutic activities;Therapeutic exercise;Balance training;Neuromuscular re-education;Patient/family education    PT Goals (Current goals can be found in the Care Plan section)  Acute Rehab PT Goals Patient Stated Goal: to go home PT Goal Formulation: With patient/family Time For Goal Achievement: 06/12/17 Potential to Achieve Goals: Good    Frequency Min 5X/week   Barriers to discharge        Co-evaluation               AM-PAC PT "6 Clicks" Daily Activity  Outcome Measure Difficulty turning over in bed (including adjusting bedclothes, sheets and blankets)?: A Little Difficulty moving from lying on Bell to sitting on the side of the bed? : A Little Difficulty sitting down on and standing up from a chair with arms (e.g., wheelchair, bedside commode, etc,.)?: A Little Help needed moving to and from a bed to chair (including a wheelchair)?: A Lot Help needed walking in hospital room?: A Lot Help needed climbing 3-5 steps with a railing? : A Lot 6 Click Score: 15    End of Session Equipment Utilized During Treatment: Gait belt Activity Tolerance: Patient limited by pain Patient left: in chair;with call bell/phone within reach;with family/visitor present Nurse Communication: Mobility status;Patient requests pain meds;Precautions PT Visit Diagnosis: Muscle weakness (generalized)  (M62.81);Difficulty in walking, not elsewhere classified (R26.2)    Time: 1610-9604 PT Time Calculation (min) (ACUTE ONLY): 18 min   Charges:   PT Evaluation $PT Eval Moderate Complexity: 1 Mod     PT G Codes:        Alben Deeds, PT DPT  Board Certified Neurologic Specialist [place] 05/29/2017, 5:06 PM

## 2017-05-30 ENCOUNTER — Inpatient Hospital Stay (HOSPITAL_COMMUNITY): Payer: Medicare Other

## 2017-05-30 DIAGNOSIS — G8929 Other chronic pain: Secondary | ICD-10-CM

## 2017-05-30 DIAGNOSIS — F319 Bipolar disorder, unspecified: Secondary | ICD-10-CM

## 2017-05-30 DIAGNOSIS — Z981 Arthrodesis status: Secondary | ICD-10-CM

## 2017-05-30 DIAGNOSIS — E119 Type 2 diabetes mellitus without complications: Secondary | ICD-10-CM

## 2017-05-30 DIAGNOSIS — Z888 Allergy status to other drugs, medicaments and biological substances status: Secondary | ICD-10-CM

## 2017-05-30 DIAGNOSIS — K7460 Unspecified cirrhosis of liver: Secondary | ICD-10-CM

## 2017-05-30 DIAGNOSIS — K760 Fatty (change of) liver, not elsewhere classified: Secondary | ICD-10-CM

## 2017-05-30 DIAGNOSIS — F419 Anxiety disorder, unspecified: Secondary | ICD-10-CM

## 2017-05-30 DIAGNOSIS — R531 Weakness: Secondary | ICD-10-CM

## 2017-05-30 DIAGNOSIS — R450 Nervousness: Secondary | ICD-10-CM

## 2017-05-30 DIAGNOSIS — Z91013 Allergy to seafood: Secondary | ICD-10-CM

## 2017-05-30 DIAGNOSIS — Z8614 Personal history of Methicillin resistant Staphylococcus aureus infection: Secondary | ICD-10-CM

## 2017-05-30 DIAGNOSIS — F1121 Opioid dependence, in remission: Secondary | ICD-10-CM

## 2017-05-30 DIAGNOSIS — Z8379 Family history of other diseases of the digestive system: Secondary | ICD-10-CM

## 2017-05-30 DIAGNOSIS — M4642 Discitis, unspecified, cervical region: Secondary | ICD-10-CM

## 2017-05-30 DIAGNOSIS — F17210 Nicotine dependence, cigarettes, uncomplicated: Secondary | ICD-10-CM

## 2017-05-30 DIAGNOSIS — G061 Intraspinal abscess and granuloma: Principal | ICD-10-CM

## 2017-05-30 DIAGNOSIS — Z886 Allergy status to analgesic agent status: Secondary | ICD-10-CM

## 2017-05-30 DIAGNOSIS — F1990 Other psychoactive substance use, unspecified, uncomplicated: Secondary | ICD-10-CM | POA: Diagnosis present

## 2017-05-30 DIAGNOSIS — Z881 Allergy status to other antibiotic agents status: Secondary | ICD-10-CM

## 2017-05-30 DIAGNOSIS — Z833 Family history of diabetes mellitus: Secondary | ICD-10-CM

## 2017-05-30 NOTE — Evaluation (Signed)
Clinical/Bedside Swallow Evaluation Patient Details  Name: Jamie [name] MRN: [id number] Date of Birth: [birth year]  Today's Date: 05/30/2017 Time: SLP Start Time (ACUTE ONLY): 4680 SLP Stop Time (ACUTE ONLY): 0840 SLP Time Calculation (min) (ACUTE ONLY): 17 min  Past Medical History:  Past Medical History:  Diagnosis Date  . Chronic back pain   . Cirrhosis (Jamie [name])   . Constipation   . DDD (degenerative disc disease)   . Decreased hearing on the right.   F/u by ENT in the past and no further management.  . Depression   . Diabetes mellitus   . Dyspnea    with exertion  . GERD (gastroesophageal reflux disease)   . Hypertension    took med. for HTN, several yrs. ago, no longer needs , seen by Dr. Lattie [name] last yr. during a hosp. at [facility], for MRSA bacteremia   . MRSA bacteremia 2014   Tx with Vancomycin  . NASH (nonalcoholic steatohepatitis)   . PCOS (polycystic ovarian syndrome)   . Pneumonia 2016  . Polysubstance abuse ([place])   . Sciatica    HNP- Cerv. 7- T2   Past Surgical History:  Past Surgical History:  Procedure Laterality Date  . BACK SURGERY    . BREAST SURGERY Left    blocked milk ducts   . CARPAL TUNNEL RELEASE    . CATARACT EXTRACTION W/PHACO Left 02/04/2015   Procedure: CATARACT EXTRACTION PHACO AND INTRAOCULAR LENS PLACEMENT (IOC);  Surgeon: Jamie Branch, MD;  Location: [facility];  Service: Ophthalmology;  Laterality: Left;  CDE: 4.11  . CHOLECYSTECTOMY    . COLONOSCOPY WITH PROPOFOL N/A 11/12/2015   Procedure: COLONOSCOPY WITH PROPOFOL;  Surgeon: Jamie Binder, MD;  Location: [facility];  Service: Endoscopy;  Laterality: N/A;  10:15 am  . ESOPHAGEAL BANDING N/A 11/12/2015   Procedure: ESOPHAGEAL BANDING;  Surgeon: Jamie Binder, MD;  Location: [facility];  Service: Endoscopy;  Laterality: N/A;  . ESOPHAGOGASTRODUODENOSCOPY  2007   Dr. Oneida Alar: mild antral erythema. Future procedures need to be done with Propofol per notes.   . ESOPHAGOGASTRODUODENOSCOPY  (EGD) WITH PROPOFOL N/A 11/12/2015   Procedure: ESOPHAGOGASTRODUODENOSCOPY (EGD) WITH PROPOFOL;  Surgeon: Jamie Binder, MD;  [facility];  Service: Endoscopy;  Laterality: N/A;  . INCISION AND DRAINAGE WOUND WITH TENDON REPAIR Right 07/03/2016   Procedure: INCISION AND DRAINAGE RIGHT LONG PROXIMAL INTERPHALANGEAL JOINT WOUND WITH  TENDON REPAIR AND CULTURES;  Surgeon: Jamie Crumb, MD;  Location: [place];  Service: Orthopedics;  Laterality: Right;  . NECK SURGERY    . PERIPHERALLY INSERTED CENTRAL CATHETER INSERTION Right 2007   for treatment with Vancomycin   . POLYPECTOMY  11/12/2015   Procedure: POLYPECTOMY;  Surgeon: Jamie Binder, MD;  [facility];  Service: Endoscopy;;  transverse colon polyp  . POSTERIOR CERVICAL LAMINECTOMY N/A 11/22/2013   Procedure: POSTERIOR CERVICAL LAMINECTOMY C7/T1, T/1-2;  Surgeon: Jamie Hoops, MD;  Location: [place] [facility];  Service: Neurosurgery;  Laterality: N/A;  POSTERIOR CERVICAL LAMINECTOMY C7/T1, T/1-2  . TEE WITHOUT CARDIOVERSION N/A 08/09/2012   Procedure: TRANSESOPHAGEAL ECHOCARDIOGRAM (TEE);  Surgeon: Jamie Savannah, MD;  [facility];  Service: Cardiovascular;  Laterality: N/A;   HPI:  Patient is a 47 yo female with DM who presented for neck pain found to have cervical epidural abscess and stenosis. Patient is now s/p ACDF C3/4 on 5/3. PMH of GERD, polysubstance abuse. Note that patient seen in 2016 by SLP during acute admission for PNA  requiring intubation. At that time, swallow was noted to be normal with no f/u indicated.    Assessment / Plan / Recommendation Clinical Impression  Patient presents with evidence of a suspected severe pharyngeal dysphagia, likely a result of post op edema, with overt indication of aspiration across consistencies. MBS recommended to better differentiate function and determine potential to continue po intake with use of compensatory strategies.  SLP Visit Diagnosis: Dysphagia,  pharyngeal phase (R13.13)    Aspiration Risk  Severe aspiration risk    Diet Recommendation NPO   Medication Administration: Via alternative means    Other  Recommendations Oral Care Recommendations: Oral care QID   Follow up Recommendations (TBD)             Prognosis Prognosis for Safe Diet Advancement: Fair Barriers to Reach Goals: Severity of deficits      Swallow Study   General HPI: Patient is a 47 yo female with DM who presented for neck pain found to have cervical epidural abscess and stenosis. Patient is now s/p ACDF C3/4 on 5/3. PMH of GERD, polysubstance abuse. Note that patient seen in 2016 by SLP during acute admission for PNA requiring intubation. At that time, swallow was noted to be normal with no f/u indicated.  Type of Study: Bedside Swallow Evaluation Previous Swallow Assessment: see HPI Diet Prior to this Study: Regular;Thin liquids Temperature Spikes Noted: No Respiratory Status: Room air History of Recent Intubation: (surgery only) Behavior/Cognition: Alert;Cooperative;Pleasant mood Oral Cavity Assessment: Within Functional Limits Oral Care Completed by SLP: Yes Oral Cavity - Dentition: Poor condition;Missing dentition Vision: Functional for self-feeding Self-Feeding Abilities: Able to feed self Patient Positioning: Upright in bed Baseline Vocal Quality: Wet;Hoarse Volitional Cough: Strong Volitional Swallow: Able to elicit    Oral/Motor/Sensory Function Overall Oral Motor/Sensory Function: Within functional limits   Ice Chips Ice chips: Impaired Presentation: Spoon;Self Fed Pharyngeal Phase Impairments: Multiple swallows;Wet Vocal Quality;Cough - Immediate   Thin Liquid Thin Liquid: Impaired Presentation: Cup;Self Fed Pharyngeal  Phase Impairments: Multiple swallows;Wet Vocal Quality;Cough - Immediate    Nectar Thick Nectar Thick Liquid: Not tested   Honey Thick Honey Thick Liquid: Not tested   Puree Puree: Impaired Presentation: Spoon;Self  Fed Pharyngeal Phase Impairments: Multiple swallows;Wet Vocal Quality;Cough - Delayed   Solid   Jamie Janelle MA, CCC-SLP [phone]    Solid: Impaired Presentation: (patient expectorated, feeling as if she couldnt swallow)        Elvera Almario Meryl 05/30/2017,8:45 AM

## 2017-05-30 NOTE — Progress Notes (Signed)
Rehab Admissions Coordinator Note:  Patient was screened by Retta Diones for appropriateness for an Inpatient Acute Rehab Consult.  Noted PT/OT recommending HH therapies.  At this time, we are recommending HH.  Retta Diones 05/30/2017, 7:35 PM  I can be reached at [phone].

## 2017-05-30 NOTE — Anesthesia Postprocedure Evaluation (Signed)
Anesthesia Post Note  Patient: Jamie [name]  Procedure(s) Performed: ANTERIOR CERVICAL DECOMPRESSION CERVICAL THREE-FOUR (N/A Spine Cervical)     Patient location during evaluation: PACU Anesthesia Type: General Level of consciousness: awake and alert Pain management: pain level controlled Vital Signs Assessment: post-procedure vital signs reviewed and stable Respiratory status: spontaneous breathing, nonlabored ventilation, respiratory function stable and patient connected to nasal cannula oxygen Cardiovascular status: blood pressure returned to baseline and stable Postop Assessment: no apparent nausea or vomiting Anesthetic complications: no    Last Vitals:  Vitals:   05/30/17 0942 05/30/17 1249  BP:  (!) 144/88  Pulse: 100 (!) 117  Resp: 18 15  Temp:  36.9 C  SpO2: 91% 97%    Last Pain:  Vitals:   05/30/17 1249  TempSrc: Oral  PainSc:                  [name]

## 2017-05-30 NOTE — Consult Note (Signed)
Jamie [name]    Date of Admission:  05/28/2017           Day 1 vancomycin        Day 1 ceftriaxone       Reason for Consult: Cervical discitis with epidural abscess    Referring Provider: Dr. Ashok Pall  Assessment: She has acute C-spine infection.  So far no organism has been seen on stain or isolated in culture.  Cultures could remain falsely negative due to recent amoxicillin therapy.  I will continue vancomycin and ceftriaxone for now.  There will need to be further discussion about her history of injecting drug use in order to determine what antibiotic regimen will be safe and effective for her to receive as an outpatient.  Plan: 1. 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 canagliflozin  300 mg Oral QAC breakfast  . docusate sodium  100 mg Oral BID  . fluticasone furoate-vilanterol  1 puff Inhalation Daily  . gabapentin  300 mg Oral TID  . metFORMIN  1,000 mg Oral BID WC  . nicotine  21 mg Transdermal Daily  . oxyCODONE  10 mg Oral Q12H  . pantoprazole  40 mg Oral BID  . pravastatin  20 mg Oral q1800   Continuous Infusions: . cefTRIAXone (ROCEPHIN)  IV Stopped (05/29/17 2351)  . vancomycin Stopped (05/30/17 0852)   PRN Meds:.acetaminophen **OR** acetaminophen, bisacodyl, cyclobenzaprine, magnesium citrate, menthol-cetylpyridinium **OR** phenol, morphine injection, ondansetron **OR** ondansetron (ZOFRAN) IV, oxyCODONE, oxyCODONE,  senna-docusate, tiotropium, zolpidem  HPI: Jamie [name] is a 47 y.o. female with a history of obesity, diabetes, fatty liver, cirrhosis and chronic pain.  She underwent surgery for decompression from C7-T2 for cervical stenosis in 2015.  She woke up on 05/16/2017 with acute neck pain and hoarseness.  She was seen in the emergency department on 05/19/2017.  She was afebrile.  She was noted to be tender with palpation over her posterior neck and shoulders but no weakness was documented.  For reasons I am unable to discern she was treated with empiric amoxicillin and prednisone.  She completed amoxicillin therapy on 05/26/2017.  She had progressively severe pain and began to develop left arm weakness.  She saw Dr. at [facility] who ordered a cervical MRI which revealed her prior C5-6 fusion and acute C3-4 discitis with epidural abscess.  She was admitted and underwent surgery yesterday.  Pus was encountered.  Operative Gram stain revealed no organisms.  Cultures are negative so far.  She has a history of chronic opioid dependence.  She has a history of injecting drug use.  She was hospitalized with MRSA bacteremia in 2014.  She tells me that she was crushing and shooting pain pills up until 8 months ago.  She decided then that she needed a "new life."  She says that she has been seeing Dr. Antonietta [name], a psychiatrist in [place], [place] who has  prescribed Suboxone for her.  She currently lives in [place], [place] with her parents.   Review of Systems: Review of Systems  Constitutional: Positive for malaise/fatigue. Negative for chills, diaphoresis, fever and weight loss.  HENT: Negative for sore throat.        Hoarseness.  Respiratory: Negative for cough.   Cardiovascular: Negative for chest pain.  Gastrointestinal: Negative for abdominal pain, diarrhea, nausea and vomiting.  Genitourinary: Negative for dysuria.  Musculoskeletal: Positive for back pain and neck pain.  Skin: Negative  for rash.  Neurological: Positive for focal weakness. Negative for dizziness and headaches.  Psychiatric/Behavioral: Positive for depression and substance abuse. The patient is nervous/anxious.     Past Medical History:  Diagnosis Date  . Chronic back pain   . Cirrhosis ([place])   . Constipation   . DDD (degenerative disc [name])   . Decreased hearing on the right.   F/u by ENT in the past and no further management.  . Depression   . Diabetes mellitus   . Dyspnea    with exertion  . GERD (gastroesophageal reflux [name])   . Hypertension    took med. for HTN, several yrs. ago, no longer needs , seen by Dr. Lattie Haw last yr. during a hosp. at [facility], for MRSA bacteremia   . MRSA bacteremia 2014   Tx with Vancomycin  . NASH (nonalcoholic steatohepatitis)   . PCOS (polycystic ovarian syndrome)   . Pneumonia 2016  . Polysubstance abuse ([place])   . Sciatica    HNP- Cerv. 7- T2    Social History   Tobacco Use  . Smoking status: Current Every Day Smoker    Packs/day: 1.00    Years: 20.00    Pack years: 20.00    Types: Cigarettes  . Smokeless tobacco: Never Used  Substance Use Topics  . Alcohol use: Yes    Comment: rare  . Drug use: Yes    Types: IV, Marijuana    Comment: prior history of cocaine use, none in 15 years     Family History  Problem Relation Age of Onset  . Hypertension Mother   . Hypertension Father   . Cirrhosis Father        Drank some alcohol  . Diabetes Brother   . Diabetes Other   . Cirrhosis Paternal Aunt        No alcohol  . Cirrhosis Paternal Aunt        No alcohol  . Colon cancer Neg Hx    Allergies  Allergen Reactions  . Chantix [Varenicline] Other (See Comments)    Vivid nightmares  . Ciprofloxacin Swelling  . Nsaids Hives    Pt takes ibuprofen with no reaction  . Shrimp [Shellfish Allergy] Itching    Makes hands itch   . Tylenol [Acetaminophen] Other (See Comments)    Patient has cirrhosis of the liver and this causes elevated levels      OBJECTIVE: Blood pressure (!) 144/88, pulse (!) 117, temperature 98.4 F (36.9 C), temperature source Oral, resp. rate 15, height 5' 5"  (1.651 m), weight 195 lb (88.5 kg), SpO2 97 %.  Physical Exam  Constitutional: She is oriented to person, place, and time.  She is resting quietly in bed.  She is alert and in no distress.  HENT:  She has marked swelling of her right anterior neck with a fresh surgical incision.  She speaks with a whispered, hoarse voice.  Cardiovascular: Regular rhythm and normal heart sounds.  No murmur heard. She  is tachycardic.  Pulmonary/Chest: Effort normal and breath sounds normal.  Abdominal: Soft. She exhibits no distension. There is no tenderness.  Neurological: She is alert and oriented to person, place, and time.  Left arm weakness.  Skin: No rash noted.  Psychiatric: She has a normal mood and affect.    Lab Results Lab Results  Component Value Date   WBC 17.7 (H) 05/28/2017   HGB 13.3 05/28/2017   HCT 39.0 05/28/2017   MCV 93.1 05/28/2017   PLT 217 05/28/2017    Lab Results  Component Value Date   CREATININE 1.21 (H) 05/28/2017   BUN 30 (H) 05/28/2017   NA 135 05/28/2017   K 4.7 05/28/2017   CL 96 (L) 05/28/2017   CO2 24 05/28/2017    Lab Results  Component Value Date   ALT 32 05/28/2017   AST 29 05/28/2017   ALKPHOS 118 05/28/2017   BILITOT 0.6 05/28/2017     Microbiology: Recent Results (from the past 240 hour(s))  Culture, blood (Routine x 2)     Status: None (Preliminary result)   Collection Time: 05/28/17  3:10 PM  Result Value Ref Range Status   Specimen Description BLOOD BLOOD RIGHT FOREARM  Final   Special Requests   Final    BOTTLES DRAWN AEROBIC AND ANAEROBIC Blood Culture adequate volume   Culture   Final    NO GROWTH 2 DAYS Performed at [facility], [address] [place], [place] [address]    Report Status PENDING  Incomplete  Culture, blood (Routine x 2)     Status: None (Preliminary result)    Collection Time: 05/28/17  4:07 PM  Result Value Ref Range Status   Specimen Description BLOOD LEFT FOREARM  Final   Special Requests   Final    BOTTLES DRAWN AEROBIC AND ANAEROBIC Blood Culture adequate volume   Culture   Final    NO GROWTH 2 DAYS Performed at [facility], [place] [address], [place], [place] [address]    Report Status PENDING  Incomplete  Aerobic/Anaerobic Culture (surgical/deep wound)     Status: None (Preliminary result)   Collection Time: 05/28/17 11:41 PM  Result Value Ref Range Status   Specimen Description WOUND D  Final   Special Requests NONE  Final   Gram Stain NO WBC SEEN NO ORGANISMS SEEN   Final   Culture   Final    NO GROWTH 1 DAY Performed at [facility], [place] [address], [place], [place] [address]    Report Status PENDING  Incomplete  Aerobic/Anaerobic Culture (surgical/deep wound)     Status: None (Preliminary result)   Collection Time: 05/28/17 11:51 PM  Result Value Ref Range Status   Specimen Description WOUND A  Final   Special Requests NONE  Final   Gram Stain   Final    RARE WBC PRESENT, PREDOMINANTLY PMN NO ORGANISMS SEEN    Culture   Final    NO GROWTH 1 DAY Performed at [facility], [place] [address] [place], [place] [address]    Report Status PENDING  Incomplete  Aerobic/Anaerobic Culture (surgical/deep wound)     Status: None (Preliminary result)   Collection Time: 05/28/17 11:56 PM  Result Value Ref Range Status   Specimen Description WOUND C  Final   Special Requests NONE  Final   Gram Stain   Final    RARE WBC PRESENT, PREDOMINANTLY PMN NO ORGANISMS SEEN    Culture  Final    NO GROWTH 1 DAY Performed at [facility], [place] [address] [place], [place] [address]    Report Status PENDING  Incomplete  Aerobic/Anaerobic Culture (surgical/deep wound)     Status: None (Preliminary result)   Collection Time: 05/29/17 12:01 AM  Result Value Ref Range Status   Specimen Description WOUND B  Final    Special Requests NONE  Final   Gram Stain   Final    FEW WBC PRESENT, PREDOMINANTLY PMN NO ORGANISMS SEEN    Culture   Final    CULTURE REINCUBATED FOR BETTER GROWTH Performed at [facility], [place] [address] [place], [place] [address]    Report Status PENDING  Incomplete  MRSA PCR Screening     Status: None   Collection Time: 05/29/17 11:48 AM  Result Value Ref Range Status   MRSA by PCR NEGATIVE NEGATIVE Final    Comment:        The GeneXpert MRSA Assay (FDA approved for NASAL specimens only), is one component of a comprehensive MRSA colonization surveillance program. It is not intended to diagnose MRSA infection nor to guide or monitor treatment for MRSA infections. Performed at [facility], [place] [address] [place],  [address]     Michel Bickers, [place] [facility] [phone] pager   [phone] cell 05/30/2017, 2:11 PM

## 2017-05-30 NOTE — Progress Notes (Signed)
Modified Barium Swallow Progress Note  Patient Details  Name: Jamie [name] MRN: [id number] Date of Birth: 09-19-1970  Today's Date: 05/30/2017  Modified Barium Swallow completed.  Full report located under Chart Review in the Imaging Section.  Brief recommendations include the following:  Clinical Impression  47 year old female with an anatomically based pharyngeal dysphagia characterized by significant edema from C2-6 post ACDF causing decreased epiglottic deflection, laryngeal closure, and UES relaxation. Mild-moderate pharyngeal residuals noted post swallow, penetrating and occassionally aspirating into the airway with all consistencies provided. Patient however able to clear, avoiding deep aspiration, with combination of spontaneous and cued throat clearing/cough and dry swallows. Discussed with patient risks and benefits of NPO vs po status. Given intact cognition for carryout of compensatory strategies, strong cough, and intact mobility for mobilization of potential aspirates, decision made to continue conservative po diet with strict precautions. Prognosis for ability to advance diet good with decreased edema. If edema continues to progress causing a worsening in dysphagia, will need to consider NPO status with temporary non-oral means of nutrition.    Swallow Evaluation Recommendations       SLP Diet Recommendations: Dysphagia 1 (Puree) solids;Thin liquid   Liquid Administration via: Cup;No straw   Medication Administration: Crushed with puree   Supervision: Patient able to self feed;Intermittent supervision to cue for compensatory strategies   Compensations: Slow rate;Small sips/bites;Hard cough after swallow;Multiple dry swallows after each bite/sip   Postural Changes: Seated upright at 90 degrees   Oral Care Recommendations: Oral care BID     Gabriel Rainwater MA, CCC-SLP [phone]    [name] 05/30/2017,11:12 AM

## 2017-05-30 NOTE — Progress Notes (Signed)
Physical Therapy Treatment Patient Details Name: Jamie [name] MRN: [id number] DOB: 01-10-1971 Today's Date: 05/30/2017    History of Present Illness Patient is a 47 yo female with DM who presented for neck pain found to have cervical epidural abscess and stenosis. Patient is now s/p ACDF C3/4.  PMH includes:  Polysubstance abuse,  NASH, cirrhosis, sciatica    PT Comments    Patient seen for activity progression. Significant improvements noted compared to previous session. Patient does not show signs of buckling in LEs. Was able to ambulate without physical assist and tolerated increased distance. Patient still reports deficits in LUE but states they are improving as well. Will update POC for d/c home with home health services. Patient in agreement. Will continue to see and progress as tolerated.   Follow Up Recommendations  Home health PT;Supervision/Assistance - 24 hour     Equipment Recommendations  None recommended by PT    Recommendations for Other Services       Precautions / Restrictions Precautions Precautions: Cervical Precaution Booklet Issued: Yes (comment) Precaution Comments: Pt requires min cues for cervical precautions     Mobility  Bed Mobility Overal bed mobility: Needs Assistance Bed Mobility: Supine to Sit;Sit to Supine     Supine to sit: Supervision;HOB elevated Sit to supine: Supervision;HOB elevated      Transfers Overall transfer level: Needs assistance   Transfers: Sit to/from Stand;Stand Pivot Transfers Sit to Stand: Supervision Stand pivot transfers: Min guard       General transfer comment: supervision for safety but no physical assist required today  Ambulation/Gait Ambulation/Gait assistance: Min guard Ambulation Distance (Feet): 130 Feet Assistive device: None Gait Pattern/deviations: Step-through pattern;Decreased stride length;Shuffle;Drifts right/left Gait velocity: decreased Gait velocity interpretation: <1.8 ft/sec,  indicate of risk for recurrent falls General Gait Details: improved overall stability today, no physical assist needed.    Stairs             Wheelchair Mobility    Modified Rankin (Stroke Patients Only)       Balance Overall balance assessment: Needs assistance Sitting-balance support: Feet supported;No upper extremity supported Sitting balance-Leahy Scale: Good     Standing balance support: During functional activity;No upper extremity supported Standing balance-Leahy Scale: Fair                              Cognition Arousal/Alertness: Awake/alert Behavior During Therapy: WFL for tasks assessed/performed Overall Cognitive Status: Within Functional Limits for tasks assessed                                        Exercises Other Exercises Other Exercises: Pt performed AAROM Lt shoulder flexion x 15 reps, and abduction x 15 reps  Other Exercises: AAROM > AAROM Lt sup/pron Other Exercises: Pt instructed in lap sides and able to return demonsration.      General Comments        Pertinent Vitals/Pain Pain Assessment: Faces Pain Score: 9  Faces Pain Scale: Hurts worst Pain Location: head and neck  Pain Descriptors / Indicators: Headache;Grimacing;Guarding;Numbness Pain Intervention(s): Monitored during session;Repositioned    Home Living Family/patient expects to be discharged to:: Private residence Living Arrangements: Parent Available Help at Discharge: Family Type of Home: House Home Access: Stairs to enter Entrance Stairs-Rails: None Home Layout: One level Home Equipment: Shower seat;Grab bars - tub/shower  Prior Function Level of Independence: Independent      Comments: Pt reports she was not workind due to disability, but was able to drive    PT Goals (current goals can now be found in the care plan section) Acute Rehab PT Goals Patient Stated Goal: to go home PT Goal Formulation: With patient/family Time For  Goal Achievement: 06/12/17 Potential to Achieve Goals: Good Progress towards PT goals: Progressing toward goals    Frequency    Min 5X/week      PT Plan Discharge plan needs to be updated    Co-evaluation              AM-PAC PT "6 Clicks" Daily Activity  Outcome Measure  Difficulty turning over in bed (including adjusting bedclothes, sheets and blankets)?: A Little Difficulty moving from lying on back to sitting on the side of the bed? : A Little Difficulty sitting down on and standing up from a chair with arms (e.g., wheelchair, bedside commode, etc,.)?: A Little Help needed moving to and from a bed to chair (including a wheelchair)?: A Lot Help needed walking in hospital room?: A Lot Help needed climbing 3-5 steps with a railing? : A Lot 6 Click Score: 15    End of Session Equipment Utilized During Treatment: Gait belt Activity Tolerance: Patient limited by pain Patient left: in bed;with call bell/phone within reach(sitting EOB) Nurse Communication: Mobility status;Patient requests pain meds;Precautions PT Visit Diagnosis: Muscle weakness (generalized) (M62.81);Difficulty in walking, not elsewhere classified (R26.2)     Time: 1282-0813 PT Time Calculation (min) (ACUTE ONLY): 15 min  Charges:  $Gait Training: 8-22 mins                    G Codes:       Jamie [name], PT DPT  Board Certified Neurologic Specialist Jamie [name] 05/30/2017, 12:26 PM

## 2017-05-30 NOTE — Evaluation (Signed)
Occupational Therapy Evaluation Patient Details Name: Jamie [name] MRN: [id number] DOB: 12/27/1970 Today's Date: 05/30/2017    History of Present Illness Patient is a 47 yo female with DM who presented for neck pain found to have cervical epidural abscess and stenosis. Patient is now s/p ACDF C3/4.  PMH includes:  Polysubstance abuse,  NASH, cirrhosis, sciatica   Clinical Impression   Pt admitted with above. She demonstrates the below listed deficits and will benefit from continued OT to maximize safety and independence with BADLs.  Pt presents to OT with decreased activity tolerance, pain in head and neck, decreased Lt UE strength and function proximal worse than distal, decreased balance.  She requires set up - min guard assist for UB ADLs and min A for LB ADLs, and min guard for functional transfers.   She lived with parents PTA, and was independent with ADLs as well as driving.  She was not working.   She was instructed in initial HEP for Lt UE.  Will follow acutely.       Follow Up Recommendations  Home health OT;Supervision/Assistance - 24 hour    Equipment Recommendations  None recommended by OT    Recommendations for Other Services       Precautions / Restrictions Precautions Precautions: Cervical Precaution Booklet Issued: Yes (comment) Precaution Comments: Pt requires min cues for cervical precautions  Restrictions Weight Bearing Restrictions: No      Mobility Bed Mobility Overal bed mobility: Needs Assistance Bed Mobility: Supine to Sit;Sit to Supine     Supine to sit: Supervision;HOB elevated Sit to supine: Supervision;HOB elevated      Transfers Overall transfer level: Needs assistance   Transfers: Sit to/from Stand;Stand Pivot Transfers Sit to Stand: Min guard Stand pivot transfers: Min guard       General transfer comment: min guard assist for safety     Balance Overall balance assessment: Needs assistance Sitting-balance support: Feet  supported;No upper extremity supported Sitting balance-Leahy Scale: Good     Standing balance support: During functional activity;No upper extremity supported Standing balance-Leahy Scale: Fair                             ADL either performed or assessed with clinical judgement   ADL Overall ADL's : Needs assistance/impaired Eating/Feeding: Modified independent;Bed level;Sitting   Grooming: Wash/dry hands;Wash/dry face;Oral care;Brushing hair;Min guard;Standing   Upper Body Bathing: Sitting;Moderate assistance   Lower Body Bathing: Min guard;Sit to/from stand   Upper Body Dressing : Sitting;Moderate assistance   Lower Body Dressing: Sit to/from stand;Minimal assistance Lower Body Dressing Details (indicate cue type and reason): able to don/doff socks with supervision and increased time.  Assist to pull pants over hips  Toilet Transfer: Min guard;Ambulation;Comfort height toilet;Grab bars   Toileting- Clothing Manipulation and Hygiene: Minimal assistance;Sit to/from stand Toileting - Clothing Manipulation Details (indicate cue type and reason): assist to pull pants over hips      Functional mobility during ADLs: Min guard       Vision         Perception     Praxis      Pertinent Vitals/Pain Pain Assessment: 0-10 Pain Score: 9  Pain Location: head and neck  Pain Descriptors / Indicators: Headache;Grimacing;Guarding;Numbness Pain Intervention(s): Monitored during session     Hand Dominance Right   Extremity/Trunk Assessment Upper Extremity Assessment Upper Extremity Assessment: LUE deficits/detail LUE Deficits / Details: deltoid 2/5 (able to demonstrate shoulder flexion to ~40*;  elbow flex 3/5, sup 2+/5; pron 3/5;  ext 3-/5; wrist flex/ext 3+/5; grip 4-/5 LUE Coordination: decreased gross motor   Lower Extremity Assessment Lower Extremity Assessment: Defer to PT evaluation   Cervical / Trunk Assessment Cervical / Trunk Assessment: (c/p cervical  fusion - noticable edema surrounding incision  )   Communication Communication Communication: Expressive difficulties(low volume )   Cognition Arousal/Alertness: Awake/alert Behavior During Therapy: WFL for tasks assessed/performed Overall Cognitive Status: Within Functional Limits for tasks assessed                                     General Comments       Exercises Exercises: Other exercises Other Exercises Other Exercises: Pt performed AAROM Lt shoulder flexion x 15 reps, and abduction x 15 reps  Other Exercises: AAROM > AAROM Lt sup/pron Other Exercises: Pt instructed in lap sides and able to return demonsration.     Shoulder Instructions      Home Living Family/patient expects to be discharged to:: Private residence Living Arrangements: Parent Available Help at Discharge: Family Type of Home: House Home Access: Stairs to enter Technical brewer of Steps: 3 Entrance Stairs-Rails: None Home Layout: One level     Bathroom Shower/Tub: Corporate investment banker: [place]: Shower seat;Grab bars - tub/shower          Prior Functioning/Environment Level of Independence: Independent        Comments: Pt reports she was not workind due to disability, but was able to drive         OT Problem List: Decreased strength;Decreased range of motion;Decreased activity tolerance;Impaired balance (sitting and/or standing);Decreased knowledge of use of DME or AE;Impaired UE functional use;Pain      OT Treatment/Interventions: Self-care/ADL training;Therapeutic exercise;Neuromuscular education;DME and/or AE instruction;Therapeutic activities;Patient/family education;Balance training    OT Goals(Current goals can be found in the care plan section) Acute Rehab OT Goals Patient Stated Goal: to get my arm back  OT Goal Formulation: With patient Time For Goal Achievement: 06/13/17 Potential to Achieve Goals: Good ADL  Goals Pt Will Perform Grooming: with modified independence;standing Pt Will Perform Upper Body Bathing: with modified independence;standing;sitting Pt Will Perform Lower Body Bathing: with modified independence;sit to/from stand Pt Will Perform Upper Body Dressing: with modified independence;sitting Pt Will Perform Lower Body Dressing: with modified independence;sit to/from stand Pt Will Transfer to Toilet: with modified independence;ambulating;regular height toilet;bedside commode Pt Will Perform Toileting - Clothing Manipulation and hygiene: with modified independence;sit to/from stand Pt Will Perform Tub/Shower Transfer: Tub transfer;with supervision;ambulating;shower seat;grab bars Pt/caregiver will Perform Home Exercise Program: Increased ROM;Increased strength;Left upper extremity;With written HEP provided;With Supervision  OT Frequency: Min 2X/week   Barriers to D/C:            Co-evaluation              AM-PAC PT "6 Clicks" Daily Activity     Outcome Measure Help from another person eating meals?: None Help from another person taking care of personal grooming?: A Little Help from another person toileting, which includes using toliet, bedpan, or urinal?: A Little Help from another person bathing (including washing, rinsing, drying)?: A Little Help from another person to put on and taking off regular upper body clothing?: A Lot Help from another person to put on and taking off regular lower body clothing?: A Little 6 Click Score: 18   End  of Session Equipment Utilized During Treatment: Gait belt Nurse Communication: Mobility status;Patient requests pain meds  Activity Tolerance: Patient limited by pain Patient left: in bed;with call bell/phone within reach;with nursing/sitter in room  OT Visit Diagnosis: Unsteadiness on feet (R26.81);Pain;Muscle weakness (generalized) (M62.81) Pain - Right/Left: Left Pain - part of body: (head/neck )                Time:  7289-7915 OT Time Calculation (min): 19 min Charges:  OT General Charges $OT Visit: 1 Visit OT Evaluation $OT Eval Moderate Complexity: 1 Mod G-Codes:     [facility], OTR/L [phone]   Ladonna Snide, Arianny Pun M 05/30/2017, 10:30 AM

## 2017-05-31 ENCOUNTER — Encounter (HOSPITAL_COMMUNITY): Payer: Self-pay | Admitting: Neurosurgery

## 2017-05-31 DIAGNOSIS — M4622 Osteomyelitis of vertebra, cervical region: Secondary | ICD-10-CM

## 2017-05-31 DIAGNOSIS — M4802 Spinal stenosis, cervical region: Secondary | ICD-10-CM

## 2017-05-31 DIAGNOSIS — F1120 Opioid dependence, uncomplicated: Secondary | ICD-10-CM

## 2017-05-31 LAB — BASIC METABOLIC PANEL
Anion gap: 14 (ref 5–15)
BUN: 19 mg/dL (ref 6–20)
CHLORIDE: 95 mmol/L — AB (ref 101–111)
CO2: 26 mmol/L (ref 22–32)
CREATININE: 0.9 mg/dL (ref 0.44–1.00)
Calcium: 9.4 mg/dL (ref 8.9–10.3)
GFR calc Af Amer: >60 mL/min (ref >60)
GFR calc non Af Amer: >60 mL/min (ref >60)
Glucose, Bld: 213 mg/dL — ABNORMAL HIGH (ref 65–99)
Potassium: 3.9 mmol/L (ref 3.5–5.1)
Sodium: 135 mmol/L (ref 135–145)

## 2017-05-31 NOTE — Transfer of Care (Signed)
Immediate Anesthesia Transfer of Care Note  Patient: Jamie Bell  Procedure(s) Performed: ANTERIOR CERVICAL DECOMPRESSION CERVICAL THREE-FOUR (N/A Spine Cervical)  Patient Location: PACU  Anesthesia Type:General  Level of Consciousness: awake, drowsy and patient cooperative  Airway & Oxygen Therapy: Patient connected to nasal cannula oxygen  Post-op Assessment: Report given to RN and Post -op Vital signs reviewed and stable  Post vital signs: Reviewed and stable  Last Vitals:  Vitals Value Taken Time  BP    Temp    Pulse    Resp    SpO2      Last Pain:  Vitals:   05/31/17 1900  TempSrc: Oral  PainSc:       Patients Stated Pain Goal: 3 (68/59/92 3414)  Complications: No apparent anesthesia complications

## 2017-05-31 NOTE — Progress Notes (Signed)
Patient ID: Jamie Bell, female   DOB: 10-10-1970, 47 y.o.   MRN: [id number] BP 108/80 (BP Location: Right Arm)   Pulse 65   Temp 97.6 F (36.4 C) (Oral)   Resp 16   Ht 5' 5"  (1.651 m)   Wt 88.5 kg (195 lb)   SpO2 90%   BMI 32.45 kg/m  Alert and oriented x 4, voice is hoarse Still with substantial soft tissue swelling at incision site. No difficulty swallowing or breathing. Will need final call from ID who is awaiting final cultures. Somewhat surprised nothing growing yet despite frank purulence, but she had been on amoxicillin.  No movement left deltoid. Continue PT, OT

## 2017-05-31 NOTE — Progress Notes (Signed)
  Speech Language Pathology Treatment: Dysphagia  Patient Details Name: Jamie Bell MRN: [id number] DOB: 04-06-1970 Today's Date: 05/31/2017 Time: 1388-7195 SLP Time Calculation (min) (ACUTE ONLY): 11 min  Assessment / Plan / Recommendation Clinical Impression  F/u diet assessment complete. Patient independently positioned self at edge of bed at 90 degrees for optimal safety with po intake. Consuming small sips of liquids and small bites of pureed solid independently, requiried min-no verbal cueing for slowed rate of intake, utilizing hard cough post swallow independently with min-moderate spontaneous coughing noted in response to likely deep penetration of pharyngeal residuals as noted during MBS. Overall, function appearing consistent with MBS from 5/5, likely continued post-op edema. Hopeful to see a decrease in overt evidence of aspiration at bedside as edema decreases. Overall however, lungs remain clear and patient afebrile to suggest overall tolerance of diet. Will continue to f/u.    HPI HPI: Patient is a 47 yo female with DM who presented for neck pain found to have cervical epidural abscess and stenosis. Patient is now s/p ACDF C3/4 on 5/3. PMH of GERD, polysubstance abuse. Note that patient seen in 2016 by SLP during acute admission for PNA requiring intubation. At that time, swallow was noted to be normal with no f/u indicated.       SLP Plan  Continue with current plan of care       Recommendations  Diet recommendations: Dysphagia 1 (puree);Thin liquid Liquids provided via: Cup;Straw Medication Administration: Crushed with puree Supervision: Patient able to self feed;Intermittent supervision to cue for compensatory strategies Compensations: Slow rate;Small sips/bites;Hard cough after swallow;Multiple dry swallows after each bite/sip Postural Changes and/or Swallow Maneuvers: Seated upright 90 degrees                Oral Care Recommendations: Oral care BID SLP  Visit Diagnosis: Dysphagia, pharyngeal phase (R13.13) Plan: Continue with current plan of care       [place], [place] [phone]    [place] 05/31/2017, 10:19 AM

## 2017-05-31 NOTE — Progress Notes (Signed)
Physical Therapy Treatment Patient Details Name: Jamie Bell MRN: [id number] DOB: 04/25/1970 Today's Date: 05/31/2017    History of Present Illness Patient is a 47 yo female with DM who presented for neck pain found to have cervical epidural abscess and stenosis. Patient is now s/p ACDF C3/4.  PMH includes:  Polysubstance abuse,  NASH, cirrhosis, sciatica    PT Comments    Progressing well with mobility. Increased distance today with one standing rest break. Overall improving with LE strength. Will attempt stair negotiation next session.   Follow Up Recommendations  Home health PT;Supervision/Assistance - 24 hour     Equipment Recommendations  None recommended by PT    Recommendations for Other Services       Precautions / Restrictions Precautions Precautions: Cervical Precaution Booklet Issued: Yes (comment) Precaution Comments: Pt requires min cues for cervical precautions  Restrictions Weight Bearing Restrictions: No    Mobility  Bed Mobility               General bed mobility comments: received up in room  Transfers Overall transfer level: Needs assistance   Transfers: Sit to/from Stand;Stand Pivot Transfers Sit to Stand: Supervision Stand pivot transfers: Min guard       General transfer comment: supervision for safety but no physical assist required today  Ambulation/Gait Ambulation/Gait assistance: Min guard Ambulation Distance (Feet): 200 Feet Assistive device: (pushing IV pole for UE support) Gait Pattern/deviations: Step-through pattern;Decreased stride length;Shuffle;Drifts right/left Gait velocity: decreased Gait velocity interpretation: 1.31 - 2.62 ft/sec, indicative of limited community ambulator General Gait Details: one standing rest break   Stairs             Wheelchair Mobility    Modified Rankin (Stroke Patients Only)       Balance Overall balance assessment: Needs assistance Sitting-balance support: Feet  supported;No upper extremity supported Sitting balance-Leahy Scale: Good Sitting balance - Comments: able to sit EOB    Standing balance support: During functional activity;No upper extremity supported Standing balance-Leahy Scale: Fair Standing balance comment: reliance on UE support                            Cognition Arousal/Alertness: Awake/alert Behavior During Therapy: WFL for tasks assessed/performed Overall Cognitive Status: Within Functional Limits for tasks assessed                                        Exercises      General Comments        Pertinent Vitals/Pain Pain Assessment: Faces Faces Pain Scale: Hurts little more Pain Location: head and neck  Pain Descriptors / Indicators: Headache;Grimacing;Guarding;Numbness Pain Intervention(s): Monitored during session    Home Living                      Prior Function            PT Goals (current goals can now be found in the care plan section) Acute Rehab PT Goals Patient Stated Goal: to go home PT Goal Formulation: With patient/family Time For Goal Achievement: 06/12/17 Potential to Achieve Goals: Good Progress towards PT goals: Progressing toward goals    Frequency    Min 5X/week      PT Plan Discharge plan needs to be updated    Co-evaluation  AM-PAC PT "6 Clicks" Daily Activity  Outcome Measure  Difficulty turning over in bed (including adjusting bedclothes, sheets and blankets)?: A Little Difficulty moving from lying on back to sitting on the side of the bed? : A Little Difficulty sitting down on and standing up from a chair with arms (e.g., wheelchair, bedside commode, etc,.)?: A Little Help needed moving to and from a bed to chair (including a wheelchair)?: A Lot Help needed walking in hospital room?: A Lot Help needed climbing 3-5 steps with a railing? : A Lot 6 Click Score: 15    End of Session Equipment Utilized During  Treatment: Gait belt Activity Tolerance: Patient limited by pain Patient left: with call bell/phone within reach(sitting EOB) Nurse Communication: Mobility status;Patient requests pain meds;Precautions PT Visit Diagnosis: Muscle weakness (generalized) (M62.81);Difficulty in walking, not elsewhere classified (R26.2)     Time: 3729-0211 PT Time Calculation (min) (ACUTE ONLY): 16 min  Charges:  $Gait Training: 8-22 mins                    G Codes:       Alben Deeds, PT DPT  Board Certified Neurologic Specialist [place] 05/31/2017, 4:58 PM

## 2017-05-31 NOTE — Progress Notes (Signed)
Occupational Therapy Treatment Patient Details Name: Jamie Bell MRN: [id number] DOB: 01-21-1971 Today's Date: 05/31/2017    History of present illness Patient is a 47 yo female with DM who presented for neck pain found to have cervical epidural abscess and stenosis. Patient is now s/p ACDF C3/4.  PMH includes:  Polysubstance abuse,  NASH, cirrhosis, sciatica   OT comments  Pt demonstrates Lt shoulder flexion and abduction to ~45-50* during reaching activities.  She performed AAROM Lt shoulder flexion, abduction, horizontal abd/add.  She was instructed in HEP.  Will continue to follow.  Follow Up Recommendations  Home health OT;Supervision/Assistance - 24 hour    Equipment Recommendations  None recommended by OT    Recommendations for Other Services      Precautions / Restrictions Precautions Precautions: Cervical Precaution Booklet Issued: Yes (comment) Precaution Comments: Pt requires min cues for cervical precautions        Mobility Bed Mobility               General bed mobility comments: Pt sitting EOB with family present   Transfers                      Balance Overall balance assessment: Needs assistance Sitting-balance support: Feet supported;No upper extremity supported Sitting balance-Leahy Scale: Good                                     ADL either performed or assessed with clinical judgement   ADL                                         General ADL Comments: Pt reports she donned her shirt today mod I      Vision       Perception     Praxis      Cognition Arousal/Alertness: Awake/alert Behavior During Therapy: WFL for tasks assessed/performed Overall Cognitive Status: Within Functional Limits for tasks assessed                                          Exercises Other Exercises Other Exercises: Pt reports she has been doing Lt UE exercises.  She performed AAROM shoulder  flex and abdj 20 reps x 3 sets shoulder flex/ext, add/abduction, elbow flex/ext and horizontal abduction/adduction  Other Exercises: She performed reaching tasks to ~45-50* shoudler flexion, and abduction Other Exercises: Pt instructed to perform table slides with a towel to reduce friction    Shoulder Instructions       General Comments      Pertinent Vitals/ Pain       Pain Assessment: Faces Faces Pain Scale: Hurts little more Pain Location: head and neck  Pain Descriptors / Indicators: Headache;Grimacing;Guarding;Numbness Pain Intervention(s): Monitored during session;Repositioned  Home Living                                          Prior Functioning/Environment              Frequency  Min 2X/week        Progress Toward Goals  OT Goals(current goals  can now be found in the care plan section)  Progress towards OT goals: Progressing toward goals     Plan Discharge plan remains appropriate    Co-evaluation                 AM-PAC PT "6 Clicks" Daily Activity     Outcome Measure   Help from another person eating meals?: None Help from another person taking care of personal grooming?: A Little Help from another person toileting, which includes using toliet, bedpan, or urinal?: A Little Help from another person bathing (including washing, rinsing, drying)?: A Little Help from another person to put on and taking off regular upper body clothing?: A Little Help from another person to put on and taking off regular lower body clothing?: A Little 6 Click Score: 19    End of Session    OT Visit Diagnosis: Muscle weakness (generalized) (M62.81) Pain - Right/Left: Left Pain - part of body: Shoulder   Activity Tolerance Patient tolerated treatment well   Patient Left in bed;with call bell/phone within reach;with family/visitor present(sitting EOB )   Nurse Communication Mobility status        Time: 3353-3174 OT Time Calculation  (min): 14 min  Charges: OT General Charges $OT Visit: 1 Visit OT Treatments $Neuromuscular Re-education: 8-22 mins  [facility], OTR/L [phone]    Lucille Passy M 05/31/2017, 10:51 PM

## 2017-05-31 NOTE — Consult Note (Signed)
[facility]  Date of Admission:  05/28/2017           Day 4 Vancomycin         Day 3 Rocephin          ASSESSMENT: Jamie Bell is a 47 y.o. Female with PMH IVDU, chronic opioid dependence on Suboxone, and cervical stenosis s/p C7-T2 decompression in 2015 who presented with neck and shoulder pain and found to have C3-C4 osteomyelitis and epidural abscess. She is now POD2 s/p emergent ACDF. Wound cx with no organisms seen, but did take amoxicillin as an outpatient just prior to admission. Complaining of neck pain this AM but overall stable and improving.   Wound cx A, B, and C with no growth at 1 day  Bcx no growth at 2 days   PLAN: 1. Continue vancomycin and Rocephin   Principal Problem:   Abscess in epidural space of cervical spine Active Problems:   DM (diabetes mellitus), type 2 (HCC)   POLYCYSTIC OVARIAN DISEASE   TOBACCO USER   HYPERTENSION, BENIGN   Opiate dependence ([place])   Spinal stenosis in cervical region   Type 2 diabetes mellitus with diabetic chronic kidney disease (HCC)   Anxiety state   Depression   Chronic back pain   Cirrhosis ([place])   IVDU (intravenous drug user)   Status post cervical spinal fusion   Scheduled Meds: . canagliflozin  300 mg Oral QAC breakfast  . docusate sodium  100 mg Oral BID  . fluticasone furoate-vilanterol  1 puff Inhalation Daily  . gabapentin  300 mg Oral TID  . metFORMIN  1,000 mg Oral BID WC  . nicotine  21 mg Transdermal Daily  . oxyCODONE  10 mg Oral Q12H  . pantoprazole  40 mg Oral BID  . pravastatin  20 mg Oral q1800   Continuous Infusions: . cefTRIAXone (ROCEPHIN)  IV Stopped (05/31/17 0440)  . vancomycin Stopped (05/31/17 0948)   PRN Meds:.acetaminophen **OR** acetaminophen, bisacodyl, cyclobenzaprine, magnesium citrate, menthol-cetylpyridinium **OR** phenol, morphine injection, ondansetron **OR** ondansetron (ZOFRAN) IV, oxyCODONE, oxyCODONE, senna-docusate, tiotropium,  zolpidem   SUBJECTIVE: Afebrile. Complaining of anterior neck and back pain this AM, otherwise feeling better.   Review of Systems: Review of Systems  Constitutional: Negative for chills and fever.  HENT:       Neck pain   Cardiovascular: Positive for chest pain. Negative for palpitations.  Gastrointestinal: Negative for abdominal pain, constipation, diarrhea and nausea.  Musculoskeletal: Positive for back pain and neck pain.  Neurological: Positive for focal weakness. Negative for dizziness and headaches.    Allergies  Allergen Reactions  . Chantix [Varenicline] Other (See Comments)    Vivid nightmares  . Ciprofloxacin Swelling  . Nsaids Hives    Pt takes ibuprofen with no reaction  . Shrimp [Shellfish Allergy] Itching    Makes hands itch   . Tylenol [Acetaminophen] Other (See Comments)    Patient has cirrhosis of the liver and this causes elevated levels    OBJECTIVE: Vitals:   05/30/17 2300 05/31/17 0300 05/31/17 0800 05/31/17 0847  BP: (!) 129/92 (!) 137/100    Pulse: (!) 123 (!) 118    Resp: 16     Temp: (!) 97.5 F (36.4 C) 97.8 F (36.6 C) (!) 97.5 F (36.4 C)   TempSrc: Oral Oral    SpO2: 93%   92%  Weight:      Height:       Body mass  index is 32.45 kg/m.  Physical Exam  Constitutional: She is oriented to person, place, and time and well-developed, well-nourished, and in no distress.  HENT:  Mouth/Throat: Oropharynx is clear and moist.  Eyes: Pupils are equal, round, and reactive to light. Conjunctivae are normal.  Neck:  Marked anterior neck swelling, neck with decreased ROM, voice is hoarsed. Neck incision healing well without signs of infection   Cardiovascular: Normal rate, regular rhythm and normal heart sounds. Exam reveals no gallop and no friction rub.  No murmur heard. Pulmonary/Chest: Effort normal and breath sounds normal. No respiratory distress. She has no wheezes. She has no rales.  Abdominal: Soft. Bowel sounds are normal. She exhibits  no distension. There is no tenderness.  Lymphadenopathy:    She has no cervical adenopathy.  Neurological: She is alert and oriented to person, place, and time.      Lab Results Lab Results  Component Value Date   WBC 17.7 (H) 05/28/2017   HGB 13.3 05/28/2017   HCT 39.0 05/28/2017   MCV 93.1 05/28/2017   PLT 217 05/28/2017    Lab Results  Component Value Date   CREATININE 1.21 (H) 05/28/2017   BUN 30 (H) 05/28/2017   NA 135 05/28/2017   K 4.7 05/28/2017   CL 96 (L) 05/28/2017   CO2 24 05/28/2017    Lab Results  Component Value Date   ALT 32 05/28/2017   AST 29 05/28/2017   ALKPHOS 118 05/28/2017   BILITOT 0.6 05/28/2017     Microbiology: Recent Results (from the past 240 hour(s))  Culture, blood (Routine x 2)     Status: None (Preliminary result)   Collection Time: 05/28/17  3:10 PM  Result Value Ref Range Status   Specimen Description BLOOD BLOOD RIGHT FOREARM  Final   Special Requests   Final    BOTTLES DRAWN AEROBIC AND ANAEROBIC Blood Culture adequate volume   Culture   Final    NO GROWTH 2 DAYS Performed at [facility], [address], [place], [place] [address]    Report Status PENDING  Incomplete  Culture, blood (Routine x 2)     Status: None (Preliminary result)   Collection Time: 05/28/17  4:07 PM  Result Value Ref Range Status   Specimen Description BLOOD LEFT FOREARM  Final   Special Requests   Final    BOTTLES DRAWN AEROBIC AND ANAEROBIC Blood Culture adequate volume   Culture   Final    NO GROWTH 2 DAYS Performed at [facility], [place] [address], [place], [place] [address]    Report Status PENDING  Incomplete  Aerobic/Anaerobic Culture (surgical/deep wound)     Status: None (Preliminary result)   Collection Time: 05/28/17 11:41 PM  Result Value Ref Range Status   Specimen Description WOUND D  Final   Special Requests NONE  Final   Gram Stain NO WBC SEEN NO ORGANISMS SEEN   Final   Culture   Final    NO GROWTH 1 DAY Performed  at [facility], [place] [address] [place],  [address]    Report Status PENDING  Incomplete  Aerobic/Anaerobic Culture (surgical/deep wound)     Status: None (Preliminary result)   Collection Time: 05/28/17 11:51 PM  Result Value Ref Range Status   Specimen Description WOUND A  Final   Special Requests NONE  Final   Gram Stain   Final    RARE WBC PRESENT, PREDOMINANTLY PMN NO ORGANISMS SEEN  Culture   Final    NO GROWTH 1 DAY Performed at [facility], [place] [address] [place], [place] [address]    Report Status PENDING  Incomplete  Aerobic/Anaerobic Culture (surgical/deep wound)     Status: None (Preliminary result)   Collection Time: 05/28/17 11:56 PM  Result Value Ref Range Status   Specimen Description WOUND C  Final   Special Requests NONE  Final   Gram Stain   Final    RARE WBC PRESENT, PREDOMINANTLY PMN NO ORGANISMS SEEN    Culture   Final    NO GROWTH 1 DAY Performed at [facility], [place] [address], [place], [place] [address]    Report Status PENDING  Incomplete  Aerobic/Anaerobic Culture (surgical/deep wound)     Status: None (Preliminary result)   Collection Time: 05/29/17 12:01 AM  Result Value Ref Range Status   Specimen Description WOUND B  Final   Special Requests NONE  Final   Gram Stain   Final    FEW WBC PRESENT, PREDOMINANTLY PMN NO ORGANISMS SEEN    Culture   Final    CULTURE REINCUBATED FOR BETTER GROWTH Performed at [facility], [place] [address], [place], [place] [address]    Report Status PENDING  Incomplete  MRSA PCR Screening     Status: None   Collection Time: 05/29/17 11:48 AM  Result Value Ref Range Status   MRSA by PCR NEGATIVE NEGATIVE Final    Comment:        The GeneXpert MRSA Assay (FDA approved for NASAL specimens only), is one component of a comprehensive MRSA colonization surveillance program. It is not intended to diagnose MRSA infection nor to guide or monitor treatment for MRSA  infections. Performed at [facility], [place] [address] [place], [place] [address]     Welford Roche, [facility] [place] [phone] pager   [phone] cell 05/31/2017, 10:12 AM

## 2017-06-01 ENCOUNTER — Inpatient Hospital Stay: Payer: Self-pay

## 2017-06-01 ENCOUNTER — Encounter (HOSPITAL_COMMUNITY): Payer: Self-pay | Admitting: General Practice

## 2017-06-01 DIAGNOSIS — M4803 Spinal stenosis, cervicothoracic region: Secondary | ICD-10-CM

## 2017-06-01 DIAGNOSIS — B379 Candidiasis, unspecified: Secondary | ICD-10-CM

## 2017-06-01 DIAGNOSIS — F1990 Other psychoactive substance use, unspecified, uncomplicated: Secondary | ICD-10-CM

## 2017-06-01 LAB — HIV ANTIBODY (ROUTINE TESTING W REFLEX): HIV SCREEN 4TH GENERATION: NONREACTIVE

## 2017-06-01 LAB — HEPATITIS PANEL, ACUTE
HEP A IGM: NEGATIVE
Hep B C IgM: NEGATIVE
Hepatitis B Surface Ag: NEGATIVE

## 2017-06-01 LAB — VANCOMYCIN, TROUGH: VANCOMYCIN TR: 17 ug/mL (ref 15–20)

## 2017-06-01 MED ORDER — FLUCONAZOLE 100 MG PO TABS
100.0000 mg | ORAL_TABLET | Freq: Every day | ORAL | Status: DC
  Administered 2017-06-01 – 2017-06-04 (×4): 100 mg via ORAL
  Filled 2017-06-01 (×4): qty 1

## 2017-06-01 NOTE — Care Management Note (Signed)
Case Management Note  Patient Details  Name: Jamie Bell MRN: [id number] Date of Birth: 06-21-1970  Subjective/Objective:                 Jamie Bell is a 47 y.o. Female with PMH IVDU, chronic opioid dependence on Suboxone,who presented with neck and shoulder pain and found to have C3-C4 osteomyelitis and epidural abscess. Continues with vancomycin and ceftriaxone. Will have PICC placed. Final Cxs pending, will continue to follow for results and treatment plan to determine disposition. Patient lives at home w a parent. Carolynn Sayers w [facility] notified to follow case for potential home IV Abxs.          Action/Plan:   Expected Discharge Date:                  Expected Discharge Plan:     In-House Referral:     Discharge planning Services  CM Consult  Post Acute Care Choice:    Choice offered to:     DME Arranged:    DME Agency:     HH Arranged:    HH Agency:     Status of Service:  In process, will continue to follow  If discussed at Long Length of Stay Meetings, dates discussed:    Additional Comments:  [name], RN 06/01/2017, 1:44 PM

## 2017-06-01 NOTE — Progress Notes (Signed)
Occupational Therapy Treatment Patient Details Name: ARAH ARO MRN: [id number] DOB: [birth year] Today's Date: 06/01/2017    History of present illness Patient is a 47 yo female with DM who presented for neck pain found to have cervical epidural abscess and stenosis. Patient is now s/p ACDF C3/4.  PMH includes:  Polysubstance abuse,  NASH, cirrhosis, sciatica   OT comments  Pt is independent with current HEP for Lt UE.  She is now able to retrieve items at ~60* shoulder elevation Lt UE.  She is now consistently using Lt UE during activity.   Follow Up Recommendations  Home health OT;Supervision/Assistance - 24 hour    Equipment Recommendations  None recommended by OT    Recommendations for Other Services      Precautions / Restrictions Precautions Precautions: Cervical Precaution Booklet Issued: Yes (comment) Precaution Comments: Pt requires min cues for cervical precautions        Mobility Bed Mobility               General bed mobility comments: Pt up in chair   Transfers                      Balance Overall balance assessment: Needs assistance Sitting-balance support: Feet supported;No upper extremity supported Sitting balance-Leahy Scale: Good     Standing balance support: During functional activity;No upper extremity supported Standing balance-Leahy Scale: Fair                             ADL either performed or assessed with clinical judgement   ADL                                         General ADL Comments: Pt reports she dressed self this am without assist      Vision       Perception     Praxis      Cognition Arousal/Alertness: Awake/alert Behavior During Therapy: WFL for tasks assessed/performed Overall Cognitive Status: Within Functional Limits for tasks assessed                                          Exercises Other Exercises Other Exercises: Pt demonstrates  independence with current HEP.  She is now able to reach to retrieve items at ~60* shoulder flexion.   Other Exercises: She performed 20 reps AAROM shoulder flexion to 90* and to 160* x 20 reps, shoulder abduction and horizontal abd/add  Other Exercises: Performed reaching activities in all planes to ~60* shoulder elevation    Shoulder Instructions       General Comments      Pertinent Vitals/ Pain       Pain Assessment: Faces Faces Pain Scale: Hurts a little bit Pain Location: head and neck  Pain Descriptors / Indicators: Grimacing;Guarding Pain Intervention(s): Monitored during session  Home Living                                          Prior Functioning/Environment              Frequency  Min 2X/week  Progress Toward Goals  OT Goals(current goals can now be found in the care plan section)  Progress towards OT goals: Progressing toward goals     Plan Discharge plan remains appropriate    Co-evaluation                 AM-PAC PT "6 Clicks" Daily Activity     Outcome Measure   Help from another person eating meals?: None Help from another person taking care of personal grooming?: A Little Help from another person toileting, which includes using toliet, bedpan, or urinal?: A Little Help from another person bathing (including washing, rinsing, drying)?: A Little Help from another person to put on and taking off regular upper body clothing?: A Little Help from another person to put on and taking off regular lower body clothing?: A Little 6 Click Score: 19    End of Session    OT Visit Diagnosis: Muscle weakness (generalized) (M62.81) Pain - Right/Left: Left Pain - part of body: Shoulder   Activity Tolerance Patient tolerated treatment well   Patient Left in chair;with call bell/phone within reach   Nurse Communication Mobility status        Time: 4445-8483 OT Time Calculation (min): 12 min  Charges: OT General  Charges $OT Visit: 1 Visit OT Treatments $Neuromuscular Re-education: 8-22 mins  .Ashraf Mesta [place], OTR/L [phone]    Lucille Passy M 06/01/2017, 1:56 PM

## 2017-06-01 NOTE — Progress Notes (Signed)
Pharmacy Antibiotic Note  Jamie Bell is a 47 y.o. female admitted on 05/28/2017 with neck and shoulder pain and found to have C3-C4 osteomyelitis and epidural abscess. Pharmacy on board for Vancomycin and Rocephin dosing.    A Vancomycin trough this evening resulted as therapeutic (VT 17 mcg/ml, goal of 15-20 mcg/ml). Renal function appears stable from 5/6 labs.   Plan: - Continue Vancomycin 1g IV every 12 hours - Continue Rocephin 2g IV every 24 hours - Will enter OPAT orders once LOT addressed by ID - Will continue to follow renal function, culture results, LOT, and antibiotic de-escalation plans   Height: 5' 5"  (165.1 cm) Weight: 195 lb (88.5 kg) IBW/kg (Calculated) : 57  Temp (24hrs), Avg:98 F (36.7 C), Min:97.6 F (36.4 C), Max:98.6 F (37 C)  Recent Labs  Lab 05/28/17 1510 05/28/17 1522 05/28/17 1716 05/31/17 1002 06/01/17 1918  WBC 17.7*  --   --   --   --   CREATININE 1.21*  --   --  0.90  --   LATICACIDVEN  --  2.73* 1.95*  --   --   VANCOTROUGH  --   --   --   --  17    Estimated Creatinine Clearance: 86.7 mL/min (by C-G formula based on SCr of 0.9 mg/dL).    Allergies  Allergen Reactions  . Chantix [Varenicline] Other (See Comments)    Vivid nightmares  . Ciprofloxacin Swelling  . Nsaids Hives    Pt takes ibuprofen with no reaction  . Shrimp [Shellfish Allergy] Itching    Makes hands itch   . Tylenol [Acetaminophen] Other (See Comments)    Patient has cirrhosis of the liver and this causes elevated levels   5/3 BCx >> ngtd 5/3 Wound A cx >> ngtd 5/3 Wound B cx >> ngtd 5/3 Wound C cx >> ngtd 5/3 Wound D cx >> ngtd 5/4 MRSA PCR >> neg  Vanc 5/3 >> Zosyn 5/3 x 1 Rocephin 5/4 >> Fluconazole 5/7 >>  Thank you for allowing pharmacy to be a part of this patient's care.  Alycia Rossetti, PharmD, BCPS Clinical Pharmacist Pager: [phone] Clinical phone for 06/01/2017 [phone] 06/01/2017 9:23 PM

## 2017-06-01 NOTE — Progress Notes (Signed)
Patient ID: Jamie Bell, female   DOB: 06-09-1970, 47 y.o.   MRN: [id number] BP 96/82 (BP Location: Left Arm)   Pulse (!) 126   Temp 97.8 F (36.6 C) (Oral)   Resp 14   Ht 5' 5"  (1.651 m)   Wt 88.5 kg (195 lb)   SpO2 93%   BMI 32.45 kg/m  Alert and oriented x 4 Speech is clear and fluent Voice is stronger Still with weakness in the left deltoid Neck swelling is decreased Continuing current abx, id worried about thrush

## 2017-06-01 NOTE — Progress Notes (Addendum)
Physical Therapy Treatment Patient Details Name: Jamie Bell MRN: [id number] DOB: March 22, 1970 Today's Date: 06/01/2017    History of Present Illness Patient is a 47 yo female with DM who presented for neck pain found to have cervical epidural abscess and stenosis. Patient is now s/p ACDF C3/4.  PMH includes:  Polysubstance abuse,  NASH, cirrhosis, sciatica    PT Comments    Making steady progress.  Reinforced all education.  Emphasized gait stability and stairs.   Follow Up Recommendations  Home health PT;Supervision/Assistance - 24 hour     Equipment Recommendations  None recommended by PT    Recommendations for Other Services       Precautions / Restrictions Precautions Precautions: Cervical Precaution Booklet Issued: Yes (comment) Precaution Comments: Pt requires min cues for cervical precautions     Mobility  Bed Mobility Overal bed mobility: Needs Assistance Bed Mobility: Sidelying to Sit   Sidelying to sit: Min guard;HOB elevated       General bed mobility comments: pt could use reinforcement. Presently not wanting to mobilize from a flat bed due to pain.  Transfers Overall transfer level: Needs assistance   Transfers: Sit to/from Stand Sit to Stand: Supervision            Ambulation/Gait Ambulation/Gait assistance: Min guard Ambulation Distance (Feet): 400 Feet Assistive device: None Gait Pattern/deviations: Step-through pattern Gait velocity: decreased   General Gait Details: mild steppage gait on the left, mild unsteadiness at times which pt manages without LOB.   Stairs Stairs: Yes   Stair Management: One rail Right;Alternating pattern;Forwards Number of Stairs: 7 General stair comments: safe with rail   Wheelchair Mobility    Modified Rankin (Stroke Patients Only)       Balance     Sitting balance-Leahy Scale: Good       Standing balance-Leahy Scale: Fair                              Cognition  Arousal/Alertness: Awake/alert Behavior During Therapy: WFL for tasks assessed/performed Overall Cognitive Status: Within Functional Limits for tasks assessed                                        Exercises      General Comments        Pertinent Vitals/Pain Pain Assessment: Faces Faces Pain Scale: Hurts little more Pain Location: head and neck  Pain Descriptors / Indicators: Grimacing;Guarding    Home Living                      Prior Function            PT Goals (current goals can now be found in the care plan section) Acute Rehab PT Goals PT Goal Formulation: With patient/family Time For Goal Achievement: 06/12/17 Potential to Achieve Goals: Good Progress towards PT goals: Progressing toward goals    Frequency    Min 5X/week      PT Plan Current plan remains appropriate    Co-evaluation              AM-PAC PT "6 Clicks" Daily Activity  Outcome Measure  Difficulty turning over in bed (including adjusting bedclothes, sheets and blankets)?: A Little Difficulty moving from lying on back to sitting on the side of the bed? : A Little Difficulty sitting down  on and standing up from a chair with arms (e.g., wheelchair, bedside commode, etc,.)?: A Little Help needed moving to and from a bed to chair (including a wheelchair)?: A Little Help needed walking in hospital room?: A Little Help needed climbing 3-5 steps with a railing? : A Little 6 Click Score: 18    End of Session   Activity Tolerance: Patient limited by pain Patient left: in chair;with call bell/phone within reach Nurse Communication: Mobility status;Precautions PT Visit Diagnosis: Muscle weakness (generalized) (M62.81);Difficulty in walking, not elsewhere classified (R26.2)     Time: 1720-1735 PT Time Calculation (min) (ACUTE ONLY): 15 min  Charges:  $Gait Training: 8-22 mins                    G Codes:       2017-06-07  Donnella Sham,  PT [phone]  (pager)   Tessie Fass Kyrstyn Greear 06/07/2017, 5:55 PM

## 2017-06-01 NOTE — Progress Notes (Signed)
Pt seen by speech therapy while Nurse was in the room. Pt passed swallow evaluation. Pt diet has been advanced.  Pt refused any snacks while she waits for lunch, did take a coke.

## 2017-06-01 NOTE — Consult Note (Addendum)
[facility] for Infectious Disease  Date of Admission:  05/28/2017           Day 4 vancomycin and ceftriaxone          ASSESSMENT: Jamie Bell is a 47 y.o. Female with PMH IVDU, chronic opioid dependence on Suboxone, and cervical stenosis s/p C7-T2 decompression in 2015 who presented with neck and shoulder pain and found to have C3-C4 osteomyelitis and epidural abscess. She is now POD3 s/p emergent ACDF. Wound cx with no organisms seen, but did take amoxicillin as an outpatient just prior to admission.   PLAN: 1. Wound cx B reincubated for better growth, remaining wound cx with no growth  2. Continue vancomycin and ceftriaxone 3. Start flucon for thrush   Principal Problem:   Abscess in epidural space of cervical spine Active Problems:   DM (diabetes mellitus), type 2 (HCC)   POLYCYSTIC OVARIAN DISEASE   TOBACCO USER   HYPERTENSION, BENIGN   Opiate dependence ([place])   Spinal stenosis in cervical region   Type 2 diabetes mellitus with diabetic chronic kidney disease (HCC)   Anxiety state   Depression   Chronic back pain   Cirrhosis ([place])   IVDU (intravenous drug user)   Status post cervical spinal fusion   Scheduled Meds: . canagliflozin  300 mg Oral QAC breakfast  . docusate sodium  100 mg Oral BID  . fluticasone furoate-vilanterol  1 puff Inhalation Daily  . gabapentin  300 mg Oral TID  . metFORMIN  1,000 mg Oral BID WC  . nicotine  21 mg Transdermal Daily  . oxyCODONE  10 mg Oral Q12H  . pantoprazole  40 mg Oral BID  . pravastatin  20 mg Oral q1800   Continuous Infusions: . cefTRIAXone (ROCEPHIN)  IV Stopped (05/31/17 2326)  . vancomycin 1,000 mg (06/01/17 0831)   PRN Meds:.acetaminophen **OR** acetaminophen, bisacodyl, cyclobenzaprine, magnesium citrate, menthol-cetylpyridinium **OR** phenol, morphine injection, ondansetron **OR** ondansetron (ZOFRAN) IV, oxyCODONE, oxyCODONE, senna-docusate, tiotropium, zolpidem   SUBJECTIVE No acute events  overnight. Continues to complain of neck and back pain but overall feels better than yesterday. States no IVDU for ~ 8 months and on Suboxone since 12/2016.  Review of Systems: Review of Systems  Constitutional: Negative for chills, fever and malaise/fatigue.  Respiratory: Negative for cough and shortness of breath.   Cardiovascular: Negative for chest pain, palpitations and leg swelling.  Musculoskeletal: Positive for back pain and neck pain.  Neurological: Positive for headaches. Negative for dizziness and focal weakness.    Allergies  Allergen Reactions  . Chantix [Varenicline] Other (See Comments)    Vivid nightmares  . Ciprofloxacin Swelling  . Nsaids Hives    Pt takes ibuprofen with no reaction  . Shrimp [Shellfish Allergy] Itching    Makes hands itch   . Tylenol [Acetaminophen] Other (See Comments)    Patient has cirrhosis of the liver and this causes elevated levels    OBJECTIVE: Vitals:   05/31/17 2300 06/01/17 0300 06/01/17 0800 06/01/17 0951  BP: 114/86 140/86 111/70   Pulse: (!) 113 (!) 101 (!) 52   Resp: 16 18 18    Temp: 98 F (36.7 C) 97.6 F (36.4 C)    TempSrc: Oral Oral Oral   SpO2: 95% 92% 98% 98%  Weight:      Height:       Body mass index is 32.45 kg/m.  Physical Exam  Constitutional: She is oriented to person, place, and time and well-developed,  well-nourished, and in no distress.  HENT:  Mouth/Throat: Oropharyngeal exudate present.  Hoarseness improving   Neck:  Neck remains swollen unchanged from yesterday and tender to the touch   Cardiovascular: Normal rate, regular rhythm and normal heart sounds. Exam reveals no gallop and no friction rub.  No murmur heard. Pulmonary/Chest: Effort normal and breath sounds normal. No respiratory distress. She has no wheezes.  Abdominal: Soft. Bowel sounds are normal.  Musculoskeletal: She exhibits no edema or deformity.  Neurological: She is alert and oriented to person, place, and time.    Lab  Results Lab Results  Component Value Date   WBC 17.7 (H) 05/28/2017   HGB 13.3 05/28/2017   HCT 39.0 05/28/2017   MCV 93.1 05/28/2017   PLT 217 05/28/2017    Lab Results  Component Value Date   CREATININE 0.90 05/31/2017   BUN 19 05/31/2017   NA 135 05/31/2017   K 3.9 05/31/2017   CL 95 (L) 05/31/2017   CO2 26 05/31/2017    Lab Results  Component Value Date   ALT 32 05/28/2017   AST 29 05/28/2017   ALKPHOS 118 05/28/2017   BILITOT 0.6 05/28/2017     Microbiology: Recent Results (from the past 240 hour(s))  Culture, blood (Routine x 2)     Status: None (Preliminary result)   Collection Time: 05/28/17  3:10 PM  Result Value Ref Range Status   Specimen Description BLOOD BLOOD RIGHT FOREARM  Final   Special Requests   Final    BOTTLES DRAWN AEROBIC AND ANAEROBIC Blood Culture adequate volume   Culture   Final    NO GROWTH 3 DAYS Performed at [facility], [address], [place], [place] [address]    Report Status PENDING  Incomplete  Culture, blood (Routine x 2)     Status: None (Preliminary result)   Collection Time: 05/28/17  4:07 PM  Result Value Ref Range Status   Specimen Description BLOOD LEFT FOREARM  Final   Special Requests   Final    BOTTLES DRAWN AEROBIC AND ANAEROBIC Blood Culture adequate volume   Culture   Final    NO GROWTH 3 DAYS Performed at [facility], [place] [address] [place], [place] [address]    Report Status PENDING  Incomplete  Aerobic/Anaerobic Culture (surgical/deep wound)     Status: None (Preliminary result)   Collection Time: 05/28/17 11:41 PM  Result Value Ref Range Status   Specimen Description WOUND D  Final   Special Requests NONE  Final   Gram Stain NO WBC SEEN NO ORGANISMS SEEN   Final   Culture   Final    NO GROWTH 2 DAYS NO ANAEROBES ISOLATED; CULTURE IN PROGRESS FOR 5 DAYS Performed at [facility], [place] [address] [place], [place] [address]    Report Status PENDING  Incomplete  Aerobic/Anaerobic  Culture (surgical/deep wound)     Status: None (Preliminary result)   Collection Time: 05/28/17 11:51 PM  Result Value Ref Range Status   Specimen Description WOUND A  Final   Special Requests NONE  Final   Gram Stain   Final    RARE WBC PRESENT, PREDOMINANTLY PMN NO ORGANISMS SEEN    Culture   Final    NO GROWTH 2 DAYS NO ANAEROBES ISOLATED; CULTURE IN PROGRESS FOR 5 DAYS Performed at [facility], [place] [address], [place], [place] [address]    Report Status PENDING  Incomplete  Aerobic/Anaerobic Culture (surgical/deep wound)  Status: None (Preliminary result)   Collection Time: 05/28/17 11:56 PM  Result Value Ref Range Status   Specimen Description WOUND C  Final   Special Requests NONE  Final   Gram Stain   Final    RARE WBC PRESENT, PREDOMINANTLY PMN NO ORGANISMS SEEN    Culture   Final    NO GROWTH 2 DAYS NO ANAEROBES ISOLATED; CULTURE IN PROGRESS FOR 5 DAYS Performed at [facility], [address], [place], [place] [address]    Report Status PENDING  Incomplete  Aerobic/Anaerobic Culture (surgical/deep wound)     Status: None (Preliminary result)   Collection Time: 05/29/17 12:01 AM  Result Value Ref Range Status   Specimen Description WOUND B  Final   Special Requests   Final    NONE Performed at [facility], [place] [address] [place], [place] [address]    Gram Stain   Final    FEW WBC PRESENT, PREDOMINANTLY PMN NO ORGANISMS SEEN    Culture   Final    CULTURE REINCUBATED FOR BETTER GROWTH NO ANAEROBES ISOLATED; CULTURE IN PROGRESS FOR 5 DAYS    Report Status PENDING  Incomplete  MRSA PCR Screening     Status: None   Collection Time: 05/29/17 11:48 AM  Result Value Ref Range Status   MRSA by PCR NEGATIVE NEGATIVE Final    Comment:        The GeneXpert MRSA Assay (FDA approved for NASAL specimens only), is one component of a comprehensive MRSA colonization surveillance program. It is not intended to diagnose MRSA infection nor to guide  or monitor treatment for MRSA infections. Performed at [facility], [place] [address], [place], [place] [address]     Welford Roche, [place] for Infectious [place] [phone] pager   [phone] cell 06/01/2017, 11:07 AM

## 2017-06-01 NOTE — Progress Notes (Signed)
  Speech Language Pathology Treatment: Dysphagia  Patient Details Name: Jamie Bell MRN: [id number] DOB: 01-18-1971 Today's Date: 06/01/2017 Time: 8413-2440 SLP Time Calculation (min) (ACUTE ONLY): 19 min  Assessment / Plan / Recommendation Clinical Impression  Pt with ongoing signs of aspiration and penetration of liquids though pts immediate sensed coughing indicative of airway protection. Vocal quality only minimally dysphonic, which pt reports is much improved today. She is eager to attempt a greater variety of solids. MBS imaging reviewed and given mild quantity of residual and subjective signs of improvement, will liberalize diet. However, pt strongly counseled to make appropriate food choices off the menu. SLP reviewed all choices with pt and provided min assist with reasoning what textures are best. Pt will not yet attempt meats or granulated textures. Pt chose tomato soup, grilled cheese, banana pudding for lunch and egg salad and  soft fruit for dinner. As long pt follows precautions, masticates well, follow solids with liquids and remains cautious and observant, trialing these foods is appropriate. Will f/u tomorrow to discuss pts tolerance.   HPI HPI: Patient is a 47 yo female with DM who presented for neck pain found to have cervical epidural abscess and stenosis. Patient is now s/p ACDF C3/4 on 5/3. PMH of GERD, polysubstance abuse. Note that patient seen in 2016 by SLP during acute admission for PNA requiring intubation. At that time, swallow was noted to be normal with no f/u indicated.       SLP Plan  Continue with current plan of care       Recommendations  Diet recommendations: Regular;Thin liquid Liquids provided via: Cup;Straw Medication Administration: Crushed with puree Supervision: Patient able to self feed;Intermittent supervision to cue for compensatory strategies Compensations: Slow rate;Small sips/bites;Hard cough after swallow;Multiple dry swallows after  each bite/sip Postural Changes and/or Swallow Maneuvers: Seated upright 90 degrees                Oral Care Recommendations: Oral care BID Follow up Recommendations: Other (comment)(TBD) SLP Visit Diagnosis: Dysphagia, pharyngeal phase (R13.13) Plan: Continue with current plan of care       GO               [facility], [place] CCC-SLP [phone]  Jamie Bell 06/01/2017, 9:03 AM

## 2017-06-01 NOTE — Progress Notes (Signed)
Inpatient Diabetes Program Recommendations  AACE/ADA: New Consensus Statement on Inpatient Glycemic Control (2015)  Target Ranges:  Prepandial:   less than 140 mg/dL      Peak postprandial:   less than 180 mg/dL (1-2 hours)      Critically ill patients:  140 - 180 mg/dL   Review of Glycemic Control  Diabetes history: DM 2 Outpatient Diabetes medications: Invokamet [phone] mg BID Current orders for Inpatient glycemic control:  Invokana 300 Daily, Metformin 1000 mg BID  Inpatient Diabetes Program Recommendations:    Glucose 200, Regular diet ordered. Please consider: Carb Modified diet CBG checks ACHS Novolog Moderate Correction 0-15 units tid + Novolog HS scale 0-5 units  Thanks,  Tama Headings RN, MSN, BC-ADM, [facility] Inpatient Diabetes Coordinator Team Pager [phone] (8a-5p)

## 2017-06-02 DIAGNOSIS — M6281 Muscle weakness (generalized): Secondary | ICD-10-CM

## 2017-06-02 LAB — CULTURE, BLOOD (ROUTINE X 2)
CULTURE: NO GROWTH
Culture: NO GROWTH
Special Requests: ADEQUATE
Special Requests: ADEQUATE

## 2017-06-02 MED ORDER — SODIUM CHLORIDE 0.9% FLUSH: 10.0000 mL | INTRAVENOUS | Status: DC | PRN

## 2017-06-02 MED ORDER — SODIUM CHLORIDE 0.9% FLUSH
10.0000 mL | Freq: Two times a day (BID) | INTRAVENOUS | Status: DC
  Administered 2017-06-02 – 2017-06-04 (×4): 10 mL

## 2017-06-02 NOTE — Consult Note (Addendum)
[facility] for Infectious Disease  Date of Admission:  05/28/2017           Day 6 vancomycin         Day 5 ceftriaxone         Zosyn 5/3 ASSESSMENT: [name] is a 47 y.o. female with PMH IVDU, chronic opioid dependence on Suboxone, and cervical stenosis s/p C7-T2 decompression in 2015 who presented with neck and shoulder pain and found to have C3-C4 osteomyelitis and epidural abscess. She is now POD4 s/p emergent ACDF. Wound cx with no organisms seen, but did take amoxicillinas an outpatient just prior to admission.  Wound culture B reincubated 5/7 for better growth  Wound culture a and C with no growth at 3 days  Blood culture 5/3 no growth at 4 days    PLAN: 1. Continue vancomycin, stop ceftriaxone 2. Await sensi of staph epi from op Cx.  3. Will need PICC line for long term antibiotic therapy  4. Flucon for thrush   Principal Problem:   Abscess in epidural space of cervical spine Active Problems:   DM (diabetes mellitus), type 2 (HCC)   POLYCYSTIC OVARIAN DISEASE   TOBACCO USER   HYPERTENSION, BENIGN   Opiate dependence ([place])   Spinal stenosis in cervical region   Type 2 diabetes mellitus with diabetic chronic kidney disease (HCC)   Anxiety state   Depression   Chronic back pain   Cirrhosis ([place])   IVDU (intravenous drug user)   Status post cervical spinal fusion   Scheduled Meds: . canagliflozin  300 mg Oral QAC breakfast  . docusate sodium  100 mg Oral BID  . fluconazole  100 mg Oral Daily  . fluticasone furoate-vilanterol  1 puff Inhalation Daily  . gabapentin  300 mg Oral TID  . metFORMIN  1,000 mg Oral BID WC  . nicotine  21 mg Transdermal Daily  . oxyCODONE  10 mg Oral Q12H  . pantoprazole  40 mg Oral BID  . pravastatin  20 mg Oral q1800   Continuous Infusions: . cefTRIAXone (ROCEPHIN)  IV Stopped (06/01/17 2341)  . vancomycin Stopped (06/01/17 2243)   PRN Meds:.acetaminophen **OR** acetaminophen, bisacodyl, cyclobenzaprine,  magnesium citrate, menthol-cetylpyridinium **OR** phenol, morphine injection, ondansetron **OR** ondansetron (ZOFRAN) IV, oxyCODONE, oxyCODONE, senna-docusate, tiotropium, zolpidem   SUBJECTIVE: Afebrile. Continue to improve. Voice is stronger this AM and tolerating PO intake. Did not voice any complaints this morning.   Review of Systems: Review of Systems  Constitutional: Negative for chills, fever and malaise/fatigue.  Respiratory: Negative for cough and shortness of breath.   Cardiovascular: Negative for chest pain, palpitations and leg swelling.  Gastrointestinal: Negative for abdominal pain and nausea.  Neurological: Negative for dizziness and headaches.    Allergies  Allergen Reactions  . Chantix [Varenicline] Other (See Comments)    Vivid nightmares  . Ciprofloxacin Swelling  . Nsaids Hives    Pt takes ibuprofen with no reaction  . Shrimp [Shellfish Allergy] Itching    Makes hands itch   . Tylenol [Acetaminophen] Other (See Comments)    Patient has cirrhosis of the liver and this causes elevated levels    OBJECTIVE: Vitals:   06/01/17 1541 06/01/17 1950 06/02/17 0014 06/02/17 0318  BP: 96/82 (!) 119/95 117/89 123/79  Pulse: (!) 126 (!) 121 (!) 107 (!) 121  Resp: 14  14   Temp: 97.8 F (36.6 C) 98.3 F (36.8 C) 98.3 F (36.8 C) 97.6 F (36.4 C)  TempSrc: Oral Oral Oral Oral  SpO2: 93% 98% 92% 94%  Weight:      Height:       Body mass index is 32.45 kg/m.  Physical Exam  Constitutional: She is oriented to person, place, and time. She appears well-developed and well-nourished. No distress.  HENT:  Head: Normocephalic and atraumatic.  Mouth/Throat: Oropharynx is clear and moist.  Anterior neck wound continues to heal well without signs of infection  Eyes: Pupils are equal, round, and reactive to light. Conjunctivae are normal.  Neck: Normal range of motion. Neck supple.  Cardiovascular: Normal rate, regular rhythm and normal heart sounds.  Pulmonary/Chest:  Effort normal and breath sounds normal.  Abdominal: Soft. Bowel sounds are normal.  Neurological: She is alert and oriented to person, place, and time.  Continues to have L shoulder weakness     Lab Results Lab Results  Component Value Date   WBC 17.7 (H) 05/28/2017   HGB 13.3 05/28/2017   HCT 39.0 05/28/2017   MCV 93.1 05/28/2017   PLT 217 05/28/2017    Lab Results  Component Value Date   CREATININE 0.90 05/31/2017   BUN 19 05/31/2017   NA 135 05/31/2017   K 3.9 05/31/2017   CL 95 (L) 05/31/2017   CO2 26 05/31/2017    Lab Results  Component Value Date   ALT 32 05/28/2017   AST 29 05/28/2017   ALKPHOS 118 05/28/2017   BILITOT 0.6 05/28/2017     Microbiology: Recent Results (from the past 240 hour(s))  Culture, blood (Routine x 2)     Status: None (Preliminary result)   Collection Time: 05/28/17  3:10 PM  Result Value Ref Range Status   Specimen Description BLOOD BLOOD RIGHT FOREARM  Final   Special Requests   Final    BOTTLES DRAWN AEROBIC AND ANAEROBIC Blood Culture adequate volume   Culture   Final    NO GROWTH 4 DAYS Performed at [facility], [place] [address] [place], [place] [address]    Report Status PENDING  Incomplete  Culture, blood (Routine x 2)     Status: None (Preliminary result)   Collection Time: 05/28/17  4:07 PM  Result Value Ref Range Status   Specimen Description BLOOD LEFT FOREARM  Final   Special Requests   Final    BOTTLES DRAWN AEROBIC AND ANAEROBIC Blood Culture adequate volume   Culture   Final    NO GROWTH 4 DAYS Performed at [facility], [place] [address], [place], [place] [address]    Report Status PENDING  Incomplete  Aerobic/Anaerobic Culture (surgical/deep wound)     Status: None (Preliminary result)   Collection Time: 05/28/17 11:41 PM  Result Value Ref Range Status   Specimen Description WOUND D  Final   Special Requests NONE  Final   Gram Stain NO WBC SEEN NO ORGANISMS SEEN   Final   Culture   Final    NO  GROWTH 3 DAYS NO ANAEROBES ISOLATED; CULTURE IN PROGRESS FOR 5 DAYS Performed at [facility], [place] [address], [place], [place] [address]    Report Status PENDING  Incomplete  Aerobic/Anaerobic Culture (surgical/deep wound)     Status: None (Preliminary result)   Collection Time: 05/28/17 11:51 PM  Result Value Ref Range Status   Specimen Description WOUND A  Final   Special Requests NONE  Final   Gram Stain   Final    RARE WBC PRESENT, PREDOMINANTLY PMN NO ORGANISMS SEEN  Culture   Final    NO GROWTH 3 DAYS NO ANAEROBES ISOLATED; CULTURE IN PROGRESS FOR 5 DAYS Performed at [facility], [place] [address], [place], [place] [address]    Report Status PENDING  Incomplete  Aerobic/Anaerobic Culture (surgical/deep wound)     Status: None (Preliminary result)   Collection Time: 05/28/17 11:56 PM  Result Value Ref Range Status   Specimen Description WOUND C  Final   Special Requests NONE  Final   Gram Stain   Final    RARE WBC PRESENT, PREDOMINANTLY PMN NO ORGANISMS SEEN    Culture   Final    NO GROWTH 3 DAYS NO ANAEROBES ISOLATED; CULTURE IN PROGRESS FOR 5 DAYS Performed at [facility], [place] [address], [place], [place] [address]    Report Status PENDING  Incomplete  Aerobic/Anaerobic Culture (surgical/deep wound)     Status: None (Preliminary result)   Collection Time: 05/29/17 12:01 AM  Result Value Ref Range Status   Specimen Description WOUND B  Final   Special Requests   Final    NONE Performed at [facility], [place] [address], [place], [place] [address]    Gram Stain   Final    FEW WBC PRESENT, PREDOMINANTLY PMN NO ORGANISMS SEEN    Culture   Final    CULTURE REINCUBATED FOR BETTER GROWTH NO ANAEROBES ISOLATED; CULTURE IN PROGRESS FOR 5 DAYS    Report Status PENDING  Incomplete  MRSA PCR Screening     Status: None   Collection Time: 05/29/17 11:48 AM  Result Value Ref Range Status   MRSA by PCR NEGATIVE NEGATIVE Final    Comment:        The  GeneXpert MRSA Assay (FDA approved for NASAL specimens only), is one component of a comprehensive MRSA colonization surveillance program. It is not intended to diagnose MRSA infection nor to guide or monitor treatment for MRSA infections. Performed at [facility], [place] [address], [place], [place] [address]     Welford Roche, [place] for [place] [phone] pager   [phone] cell 06/02/2017, 7:47 AM

## 2017-06-02 NOTE — Progress Notes (Signed)
Physical Therapy Treatment Patient Details Name: Jamie Bell MRN: [id number] DOB: 04/04/1970 Today's Date: 06/02/2017    History of Present Illness Patient is a 47 yo female with DM who presented for neck pain found to have cervical epidural abscess and stenosis. Patient is now s/p ACDF C3/4.  PMH includes:  Polysubstance abuse,  NASH, cirrhosis, sciatica    PT Comments    Improving steadily.   Emphasized improving gait stability and quality, away from steppage to heel toe pattern.  Follow Up Recommendations  Home health PT;Supervision/Assistance - 24 hour     Equipment Recommendations  None recommended by PT    Recommendations for Other Services       Precautions / Restrictions Precautions Precautions: Cervical    Mobility  Bed Mobility               General bed mobility comments: up OOB with nursing on arrival  Transfers Overall transfer level: Needs assistance   Transfers: Sit to/from Stand Sit to Stand: Supervision            Ambulation/Gait Ambulation/Gait assistance: Min guard;Supervision Ambulation Distance (Feet): 400 Feet Assistive device: None Gait Pattern/deviations: Step-through pattern Gait velocity: decreased, but able to speed up to cuing Gait velocity interpretation: 1.31 - 2.62 ft/sec, indicative of limited community ambulator General Gait Details: worked on transitioning her mild steppage gait to more of a heel toe pattern which had the benefit of also help her reach close to full knee extension in stance.   Stairs             Wheelchair Mobility    Modified Rankin (Stroke Patients Only)       Balance     Sitting balance-Leahy Scale: Good       Standing balance-Leahy Scale: Fair(to good)                              Cognition Arousal/Alertness: Awake/alert Behavior During Therapy: WFL for tasks assessed/performed Overall Cognitive Status: Within Functional Limits for tasks assessed                                        Exercises      General Comments General comments (skin integrity, edema, etc.): Reinforcing cervical care.      Pertinent Vitals/Pain Pain Assessment: Faces Faces Pain Scale: Hurts little more Pain Location: head and neck  Pain Descriptors / Indicators: Guarding Pain Intervention(s): Monitored during session    Home Living                      Prior Function            PT Goals (current goals can now be found in the care plan section) Acute Rehab PT Goals Patient Stated Goal: to go home PT Goal Formulation: With patient/family Time For Goal Achievement: 06/12/17 Potential to Achieve Goals: Good Progress towards PT goals: Progressing toward goals    Frequency    Min 5X/week      PT Plan Current plan remains appropriate    Co-evaluation              AM-PAC PT "6 Clicks" Daily Activity  Outcome Measure  Difficulty turning over in bed (including adjusting bedclothes, sheets and blankets)?: A Little Difficulty moving from lying on back to sitting on the side of  the bed? : A Little Difficulty sitting down on and standing up from a chair with arms (e.g., wheelchair, bedside commode, etc,.)?: A Little Help needed moving to and from a bed to chair (including a wheelchair)?: A Little Help needed walking in hospital room?: A Little Help needed climbing 3-5 steps with a railing? : A Little 6 Click Score: 18    End of Session   Activity Tolerance: Patient tolerated treatment well Patient left: in bed;with call bell/phone within reach Nurse Communication: Mobility status;Precautions PT Visit Diagnosis: Muscle weakness (generalized) (M62.81);Other abnormalities of gait and mobility (R26.89)     Time: 1448-1856 PT Time Calculation (min) (ACUTE ONLY): 15 min  Charges:  $Gait Training: 8-22 mins                    G Codes:       06-13-17  Donnella Sham, [place] [phone]  (pager)   Tessie Fass Ethelene Closser 06-13-17, 6:55 PM

## 2017-06-02 NOTE — Progress Notes (Signed)
Peripherally Inserted Central Catheter/Midline Placement  The IV Nurse has discussed with the patient and/or persons authorized to consent for the patient, the purpose of this procedure and the potential benefits and risks involved with this procedure.  The benefits include less needle sticks, lab draws from the catheter, and the patient may be discharged home with the catheter. Risks include, but not limited to, infection, bleeding, blood clot (thrombus formation), and puncture of an artery; nerve damage and irregular heartbeat and possibility to perform a PICC exchange if needed/ordered by physician.  Alternatives to this procedure were also discussed.  Bard Power PICC patient education guide, fact sheet on infection prevention and patient information card has been provided to patient /or left at bedside.    PICC/Midline Placement Documentation  PICC Single Lumen 01/00/71 PICC Right Basilic 42 cm 1 cm (Active)  Indication for Insertion or Continuance of Line Home intravenous therapies (PICC only) 06/02/2017  4:00 PM  Exposed Catheter (cm) 1 cm 06/02/2017  4:00 PM  Site Assessment Dry;Clean;Intact 06/02/2017  4:00 PM  Line Status Flushed;Saline locked;Blood return noted 06/02/2017  4:00 PM  Dressing Type Transparent;Securing device 06/02/2017  4:00 PM  Dressing Status Clean;Dry;Intact;Antimicrobial disc in place 06/02/2017  4:00 PM  Dressing Change Due 06/09/17 06/02/2017  4:00 PM       Frances Maywood 06/02/2017, 4:24 PM

## 2017-06-02 NOTE — Progress Notes (Signed)
Pt's voice was clearer today and some of her swelling in her neck has gone down. Still has to swallow twice and clear her throat with medication and food. Stated "the fizz in the soda bothers her".  PICC was placed Family came to visit Pt ready to be discharged Spoke to Case Management and the process of discharge can be started tomorrow contingent on clearance from doctor

## 2017-06-02 NOTE — Progress Notes (Signed)
  Speech Language Pathology Treatment: Dysphagia  Patient Details Name: Jamie Bell MRN: [id number] DOB: 25-Jun-1970 Today's Date: 06/02/2017 Time: 6812-7517 SLP Time Calculation (min) (ACUTE ONLY): 25 min  Assessment / Plan / Recommendation Clinical Impression  Pt seen with am meal, vocal quality even stronger today. Pt is enjoying regualr textures, but coughing and signs of airway penetration are ongoing and frequent though cough is very hard and likely protective. Provided verbal cues for pt to masticate solids for a longer time period, then clear throat immediately after swallowing and swallow again. Pt required moderate verbal cues to complete this in a distracting environment. Reiterated importance of choosing soft foods and avoiding meats. Will f/u for further reinforcement of compensatory strategies.   HPI HPI: Patient is a 47 yo female with DM who presented for neck pain found to have cervical epidural abscess and stenosis. Patient is now s/p ACDF C3/4 on 5/3. PMH of GERD, polysubstance abuse. Note that patient seen in 2016 by SLP during acute admission for PNA requiring intubation. At that time, swallow was noted to be normal with no f/u indicated.       SLP Plan  Continue with current plan of care       Recommendations  Diet recommendations: Regular;Thin liquid Liquids provided via: Cup;Straw Medication Administration: Whole meds with liquid Supervision: Patient able to self feed;Intermittent supervision to cue for compensatory strategies Compensations: Slow rate;Small sips/bites;Hard cough after swallow;Multiple dry swallows after each bite/sip;Clear throat intermittently;Follow solids with liquid                Oral Care Recommendations: Oral care BID Follow up Recommendations: Other (comment) SLP Visit Diagnosis: Dysphagia, oropharyngeal phase (R13.12) Plan: Continue with current plan of care       GO               [facility], [place] CCC-SLP  [phone]  Jamie Bell 06/02/2017, 12:37 PM

## 2017-06-02 NOTE — Discharge Instructions (Signed)

## 2017-06-03 LAB — AEROBIC/ANAEROBIC CULTURE W GRAM STAIN (SURGICAL/DEEP WOUND)
Culture: NO GROWTH
Gram Stain: NONE SEEN

## 2017-06-03 LAB — AEROBIC/ANAEROBIC CULTURE (SURGICAL/DEEP WOUND)
CULTURE: NO GROWTH
CULTURE: NO GROWTH

## 2017-06-03 NOTE — Progress Notes (Signed)
Patient ID: Jamie Bell, female   DOB: 1971/01/26, 47 y.o.   MRN: [id number] BP 125/89 (BP Location: Left Arm)   Pulse (!) 127   Temp 97.8 F (36.6 C) (Oral)   Resp 14   Ht 5' 5"  (1.651 m)   Wt 88.5 kg (195 lb)   SpO2 98%   BMI 32.45 kg/m  Alert and oriented x 4 Moving everything except left shoulder well Wound is swollen, voice is stronger To be discharged tomorrow

## 2017-06-03 NOTE — Consult Note (Signed)
Jamie Bell for Infectious Disease  Date of Admission:  05/28/2017           Day 7 vancomycin         Ceftriaxone  5/4-5/9        Zosyn 5/3 ASSESSMENT: Jamie Bell is a 47 y.o. female with PMH IVDU, chronic opioid dependence on Suboxone, and cervical stenosis s/p C7-T2 decompression in 2015 who presented with neck and shoulder pain and found to have C3-C4 osteomyelitis and epidural abscess. She is now POD4 s/p emergent ACDF. Wound cx with no organisms seen, but did take amoxicillinas an outpatient just prior to admission.  Wound culture B 5/7 with rare staph epidermidis  Wound culture a and C with no growth at 3 days  Blood culture 5/3 no growth at 4 days    PLAN: 1. Continue vancomycin  2. Await sensitivities of staph epi from op Cx  3. Will need PICC line for long term antibiotic therapy  4. Flucon for thrush    Principal Problem:   Abscess in epidural space of cervical spine Active Problems:   DM (diabetes mellitus), type 2 (HCC)   POLYCYSTIC OVARIAN DISEASE   TOBACCO USER   HYPERTENSION, BENIGN   Opiate dependence ([place])   Spinal stenosis in cervical region   Type 2 diabetes mellitus with diabetic chronic kidney disease (HCC)   Anxiety state   Depression   Chronic back pain   Cirrhosis ([place])   IVDU (intravenous drug user)   Status post cervical spinal fusion   Scheduled Meds: . canagliflozin  300 mg Oral QAC breakfast  . docusate sodium  100 mg Oral BID  . fluconazole  100 mg Oral Daily  . fluticasone furoate-vilanterol  1 puff Inhalation Daily  . gabapentin  300 mg Oral TID  . metFORMIN  1,000 mg Oral BID WC  . nicotine  21 mg Transdermal Daily  . oxyCODONE  10 mg Oral Q12H  . pantoprazole  40 mg Oral BID  . pravastatin  20 mg Oral q1800  . sodium chloride flush  10-40 mL Intracatheter Q12H   Continuous Infusions: . vancomycin Stopped (06/02/17 2345)   PRN Meds:.acetaminophen **OR** acetaminophen, bisacodyl, cyclobenzaprine, magnesium  citrate, menthol-cetylpyridinium **OR** phenol, morphine injection, ondansetron **OR** ondansetron (ZOFRAN) IV, oxyCODONE, oxyCODONE, senna-docusate, sodium chloride flush, tiotropium, zolpidem   SUBJECTIVE: Afebrile. Doing well, no complaints this AM.   Review of Systems:  Review of Systems  Constitutional: Negative for chills and fever.  Respiratory: Positive for sputum production. Negative for cough and shortness of breath.   Cardiovascular: Negative for chest pain and palpitations.  Gastrointestinal: Negative for abdominal pain, diarrhea and nausea.  Neurological: Negative for dizziness and headaches.    Allergies  Allergen Reactions  . Chantix [Varenicline] Other (See Comments)    Vivid nightmares  . Ciprofloxacin Swelling  . Nsaids Hives    Pt takes ibuprofen with no reaction  . Shrimp [Shellfish Allergy] Itching    Makes hands itch   . Tylenol [Acetaminophen] Other (See Comments)    Patient has cirrhosis of the liver and this causes elevated levels    OBJECTIVE: Vitals:   06/02/17 1939 06/02/17 2300 06/03/17 0342 06/03/17 0757  BP: 130/76 124/73 122/82 104/81  Pulse: (!) 124  99 99  Resp:      Temp: (!) 97.5 F (36.4 C) 98.5 F (36.9 C) 97.8 F (36.6 C) 97.9 F (36.6 C)  TempSrc: Oral Oral Oral Oral  SpO2: 97%  92%  96%  Weight:      Height:       Body mass index is 32.45 kg/m.  Physical Exam  Constitutional: She is oriented to person, place, and time and well-developed, well-nourished, and in no distress.  HENT:  Head: Normocephalic and atraumatic.  Mouth/Throat: Oropharynx is clear and moist.  Neck: Normal range of motion. Neck supple.  Neck welling improving and incision healing well   Cardiovascular: Normal rate, regular rhythm and normal heart sounds. Exam reveals no gallop and no friction rub.  No murmur heard. Pulmonary/Chest: Effort normal and breath sounds normal. No respiratory distress. She has no wheezes. She has no rales.  Abdominal: Soft.  Bowel sounds are normal.  Musculoskeletal: She exhibits no edema.  Neurological: She is alert and oriented to person, place, and time.     Lab Results Lab Results  Component Value Date   WBC 17.7 (H) 05/28/2017   HGB 13.3 05/28/2017   HCT 39.0 05/28/2017   MCV 93.1 05/28/2017   PLT 217 05/28/2017    Lab Results  Component Value Date   CREATININE 0.90 05/31/2017   BUN 19 05/31/2017   NA 135 05/31/2017   K 3.9 05/31/2017   CL 95 (L) 05/31/2017   CO2 26 05/31/2017    Lab Results  Component Value Date   ALT 32 05/28/2017   AST 29 05/28/2017   ALKPHOS 118 05/28/2017   BILITOT 0.6 05/28/2017     Microbiology: Recent Results (from the past 240 hour(s))  Culture, blood (Routine x 2)     Status: None   Collection Time: 05/28/17  3:10 PM  Result Value Ref Range Status   Specimen Description BLOOD BLOOD RIGHT FOREARM  Final   Special Requests   Final    BOTTLES DRAWN AEROBIC AND ANAEROBIC Blood Culture adequate volume   Culture   Final    NO GROWTH 5 DAYS Performed at [facility], [address] [place], [place] [address]    Report Status 06/02/2017 FINAL  Final  Culture, blood (Routine x 2)     Status: None   Collection Time: 05/28/17  4:07 PM  Result Value Ref Range Status   Specimen Description BLOOD LEFT FOREARM  Final   Special Requests   Final    BOTTLES DRAWN AEROBIC AND ANAEROBIC Blood Culture adequate volume   Culture   Final    NO GROWTH 5 DAYS Performed at [facility], [place] [address] [place], [place] [address]    Report Status 06/02/2017 FINAL  Final  Aerobic/Anaerobic Culture (surgical/deep wound)     Status: None (Preliminary result)   Collection Time: 05/28/17 11:41 PM  Result Value Ref Range Status   Specimen Description WOUND D  Final   Special Requests NONE  Final   Gram Stain NO WBC SEEN NO ORGANISMS SEEN   Final   Culture   Final    NO GROWTH 4 DAYS NO ANAEROBES ISOLATED; CULTURE IN PROGRESS FOR 5 DAYS Performed at [facility], [place] [address] [place], [place] [address]    Report Status PENDING  Incomplete  Aerobic/Anaerobic Culture (surgical/deep wound)     Status: None (Preliminary result)   Collection Time: 05/28/17 11:51 PM  Result Value Ref Range Status   Specimen Description WOUND A  Final   Special Requests NONE  Final   Gram Stain   Final    RARE WBC PRESENT, PREDOMINANTLY PMN NO ORGANISMS SEEN    Culture   Final  NO GROWTH 4 DAYS NO ANAEROBES ISOLATED; CULTURE IN PROGRESS FOR 5 DAYS Performed at [facility], [place] [address] [place], [place] [address]    Report Status PENDING  Incomplete  Aerobic/Anaerobic Culture (surgical/deep wound)     Status: None (Preliminary result)   Collection Time: 05/28/17 11:56 PM  Result Value Ref Range Status   Specimen Description WOUND C  Final   Special Requests NONE  Final   Gram Stain   Final    RARE WBC PRESENT, PREDOMINANTLY PMN NO ORGANISMS SEEN    Culture   Final    NO GROWTH 4 DAYS NO ANAEROBES ISOLATED; CULTURE IN PROGRESS FOR 5 DAYS Performed at [facility], [place] [address] [place], [place] [address]    Report Status PENDING  Incomplete  Aerobic/Anaerobic Culture (surgical/deep wound)     Status: None (Preliminary result)   Collection Time: 05/29/17 12:01 AM  Result Value Ref Range Status   Specimen Description WOUND B  Final   Special Requests   Final    NONE Performed at [facility], [place] [address], [place], [place] [address]    Gram Stain   Final    FEW WBC PRESENT, PREDOMINANTLY PMN NO ORGANISMS SEEN    Culture   Final    RARE STAPHYLOCOCCUS EPIDERMIDIS NO ANAEROBES ISOLATED; CULTURE IN PROGRESS FOR 5 DAYS    Report Status PENDING  Incomplete  MRSA PCR Screening     Status: None   Collection Time: 05/29/17 11:48 AM  Result Value Ref Range Status   MRSA by PCR NEGATIVE NEGATIVE Final    Comment:        The GeneXpert MRSA Assay (FDA approved for NASAL specimens only), is one component of  a comprehensive MRSA colonization surveillance program. It is not intended to diagnose MRSA infection nor to guide or monitor treatment for MRSA infections. Performed at [facility], [place] [address], [place], [place] [address]     Welford Roche, [place] [facility] [place] [phone] pager   [phone] cell 06/03/2017, 9:12 AM

## 2017-06-03 NOTE — Care Management Important Message (Signed)
Important Message  Patient Details  Name: Jamie Bell MRN: [id number] Date of Birth: Dec 01, 1970   Medicare Important Message Given:  Yes    Polette Nofsinger 06/03/2017, 2:12 PM

## 2017-06-03 NOTE — Progress Notes (Signed)
Spoke with Dr. Christella Noa this morning about patient progression and possible discharge. At the time care team was still waiting on infectious disease for treatment of infection. Patient will d/c home with vanc b.i.d 1430 patient found in the bathroom crying, stated :she doesn't feel as good today as she did yesterday. Nurse consoled patient and she decided to try to take a nap. Patient did state that she is ready to leave.

## 2017-06-03 NOTE — Progress Notes (Signed)
Appears that ID has final cx back; pt will need IV Vancomycin via PICC x 42 days with end date of June 14.    Will need orders for [facility] for IV antibiotic teaching and PICC line care per [facility] protocol, HHPT and OT, with face to face documentation.   I have contacted [facility] to plan for discharge likely tomorrow afternoon, with start of care for tomorrow evening dose of antibiotic, between 7-8pm.    Please notify case manager if you have any questions.    Thank you,   Reinaldo Raddle, RN, BSN  Trauma/Neuro ICU Case Manager [phone]

## 2017-06-03 NOTE — Progress Notes (Signed)
PHARMACY CONSULT NOTE FOR:  OUTPATIENT  PARENTERAL ANTIBIOTIC THERAPY (OPAT)  Indication: Osteomyelitis  Regimen: Vancomycin 1 gm IV Q 12 hours End date: 07/09/17  IV antibiotic discharge orders are pended. To discharging provider:  please sign these orders via discharge navigator,  Select New Orders & click on the button choice - Manage This Unsigned Work.     Thank you for allowing pharmacy to be a part of this patient's care.  Albertina Parr, PharmD., BCPS Clinical Pharmacist Clinical phone for 06/03/17 until 3:30pm: [phone] If after 3:30pm, please call main pharmacy at: [phone]

## 2017-06-04 DIAGNOSIS — Z4789 Encounter for other orthopedic aftercare: Secondary | ICD-10-CM | POA: Diagnosis not present

## 2017-06-04 DIAGNOSIS — G062 Extradural and subdural abscess, unspecified: Secondary | ICD-10-CM | POA: Diagnosis not present

## 2017-06-04 DIAGNOSIS — E1169 Type 2 diabetes mellitus with other specified complication: Secondary | ICD-10-CM | POA: Diagnosis not present

## 2017-06-04 DIAGNOSIS — G061 Intraspinal abscess and granuloma: Secondary | ICD-10-CM | POA: Diagnosis not present

## 2017-06-04 DIAGNOSIS — A4902 Methicillin resistant Staphylococcus aureus infection, unspecified site: Secondary | ICD-10-CM | POA: Diagnosis not present

## 2017-06-04 DIAGNOSIS — Z981 Arthrodesis status: Secondary | ICD-10-CM | POA: Diagnosis not present

## 2017-06-04 DIAGNOSIS — R7881 Bacteremia: Secondary | ICD-10-CM | POA: Diagnosis not present

## 2017-06-04 DIAGNOSIS — M4622 Osteomyelitis of vertebra, cervical region: Secondary | ICD-10-CM | POA: Diagnosis not present

## 2017-06-04 MED ORDER — VANCOMYCIN IV (FOR PTA / DISCHARGE USE ONLY): 1000.0000 mg | Freq: Two times a day (BID) | INTRAVENOUS | 0 refills | Status: AC

## 2017-06-04 MED ORDER — OXYCODONE HCL 5 MG PO TABS: 5.0000 mg | ORAL_TABLET | Freq: Four times a day (QID) | ORAL | 0 refills | Status: DC | PRN

## 2017-06-04 MED ORDER — FLUCONAZOLE 100 MG PO TABS: 100.0000 mg | ORAL_TABLET | Freq: Every day | ORAL | 0 refills | Status: DC

## 2017-06-04 NOTE — Progress Notes (Signed)
  Speech Language Pathology Treatment: Dysphagia  Patient Details Name: Jamie Bell MRN: [id number] DOB: February 06, 1970 Today's Date: 06/04/2017 Time: 0920-0950 SLP Time Calculation (min) (ACUTE ONLY): 30 min  Assessment / Plan / Recommendation Clinical Impression  No significant improvement in appearance of swallow today, persistent wet vocal quality, coughing after liquids. Reinforced strategies to clear airway, prevent coughing. Pt trialing meats today with relative success, no severe globus or regurgitation though encourage use of a soft bolus to transit any pharyngeal residue. Pt to monitor swallowing closely. If problem has not improved by the end of antibiotic course outpatient MBS or f/u with ENT could be warranted given increased risk of infection without coverage. Pt aware.   HPI HPI: Patient is a 47 yo female with DM who presented for neck pain found to have cervical epidural abscess and stenosis. Patient is now s/p ACDF C3/4 on 5/3. PMH of GERD, polysubstance abuse. Note that patient seen in 2016 by SLP during acute admission for PNA requiring intubation. At that time, swallow was noted to be normal with no f/u indicated.       SLP Plan  Continue with current plan of care       Recommendations  Diet recommendations: Regular;Thin liquid Liquids provided via: Cup;Straw Medication Administration: Whole meds with liquid Supervision: Patient able to self feed;Intermittent supervision to cue for compensatory strategies Compensations: Slow rate;Small sips/bites;Hard cough after swallow;Multiple dry swallows after each bite/sip;Clear throat intermittently;Follow solids with liquid Postural Changes and/or Swallow Maneuvers: Seated upright 90 degrees                Oral Care Recommendations: Oral care BID Follow up Recommendations: Other (comment) SLP Visit Diagnosis: Dysphagia, oropharyngeal phase (R13.12) Plan: Continue with current plan of care       GO                [facility], MA CCC-SLP [phone]  Jamie Bell 06/04/2017, 10:43 AM

## 2017-06-04 NOTE — Care Management Note (Signed)
Case Management Note Original note by: [name], RN 06/01/2017, 1:44 PM    Patient Details  Name: Jamie Bell MRN: [id number] Date of Birth: 05/07/1970  Subjective/Objective:                 [name] is a 47 y.o. Female with PMH IVDU, chronic opioid dependence on Suboxone,who presented with neck and shoulder pain and found to have C3-C4 osteomyelitis and epidural abscess. Continues with vancomycin and ceftriaxone. Will have PICC placed. Final Cxs pending, will continue to follow for results and treatment plan to determine disposition. Patient lives at home w a parent. Carolynn Sayers w [facility] notified to follow case for potential home IV Abxs.     Action/Plan: Referral to [facility] for [facility] follow up for IV antibiotics at home and PICC line care, as well as PT/OT.  Pt has had PICC with home IV antibiotics in the past, and mother will assist with IV infusions.  Pt will dc home with parents to assist.    Expected Discharge Date:    06/04/17              Expected Discharge Plan:  [place]  In-House Referral:     Discharge planning Services  CM Consult  Post Acute Care Choice:  Home Health Choice offered to:  Patient  DME Arranged:  IV pump/equipment DME Agency:  [place] Arranged:  RN, PT, OT [facility] Agency:  [place]  Status of Service:  Completed, signed off  If discussed at [place] of Stay Meetings, dates discussed:    Additional Comments:  06/04/17 J. Raylin Winer, RN, BSN Pt medically stable for [employer] home today with parents and [facility] services as arranged above.  HHRN to visit pt this evening to assist with evening dose of IV Vancomycin.  IV antibiotics to be delivered to pt's home later today, per [facility].  Ella Bodo, RN 06/04/2017, 12:22 PM

## 2017-06-04 NOTE — Progress Notes (Signed)
Occupational Therapy Treatment Patient Details Name: Jamie Bell MRN: [id number] DOB: 1970-01-28 Today's Date: 06/04/2017    History of present illness Patient is a 47 yo female with DM who presented for neck pain found to have cervical epidural abscess and stenosis. Patient is now s/p ACDF C3/4.  PMH includes:  Polysubstance abuse,  NASH, cirrhosis, sciatica   OT comments  Pt continues to demonstrate improving Lt UE function.  She is independent with HEP, and requires set up assist/supervision for ADLs.  She is very eager to discharge home.    Follow Up Recommendations  Home health OT;Supervision/Assistance - 24 hour    Equipment Recommendations    None    Recommendations for Other Services  HHOT     Precautions / Restrictions Precautions Precautions: Cervical Precaution Booklet Issued: Yes (comment) Precaution Comments: requires min cues to maintain cervical precautions        Mobility Bed Mobility                  Transfers                      Balance                                           ADL either performed or assessed with clinical judgement   ADL                                               Vision       Perception     Praxis      Cognition Arousal/Alertness: Awake/alert Behavior During Therapy: WFL for tasks assessed/performed Overall Cognitive Status: Within Functional Limits for tasks assessed                                 General Comments: Pt tearful this date.  States she is feeling overwhelmed and depressed with situation.  Encouragement provided.  Pt smiling and engaged at end of session         Exercises Other Exercises Other Exercises: Pt performed 10 reps AAROM Lt shoulder flex/ext and abd/add.  She spontaneously uses Lt UE during activity.   Reinstructed her in table slides    Shoulder Instructions       General Comments      Pertinent Vitals/  Pain       Pain Assessment: Faces Faces Pain Scale: Hurts little more Pain Location: head and neck  Pain Descriptors / Indicators: Guarding Pain Intervention(s): Monitored during session;Repositioned;Limited activity within patient's tolerance(pt deferred use of heat and ice )  Home Living                                          Prior Functioning/Environment              Frequency  Min 2X/week        Progress Toward Goals  OT Goals(current goals can now be found in the care plan section)  Progress towards OT goals: Progressing toward goals     Plan Discharge plan remains appropriate  Co-evaluation                 AM-PAC PT "6 Clicks" Daily Activity     Outcome Measure   Help from another person eating meals?: None Help from another person taking care of personal grooming?: A Little Help from another person toileting, which includes using toliet, bedpan, or urinal?: A Little Help from another person bathing (including washing, rinsing, drying)?: A Little Help from another person to put on and taking off regular upper body clothing?: A Little Help from another person to put on and taking off regular lower body clothing?: A Little 6 Click Score: 19    End of Session    OT Visit Diagnosis: Muscle weakness (generalized) (M62.81) Pain - Right/Left: Left Pain - part of body: Shoulder   Activity Tolerance Patient tolerated treatment well   Patient Left in chair;with call bell/phone within reach   Nurse Communication Mobility status        Time: 1050-1107 OT Time Calculation (min): 17 min  Charges: OT General Charges $OT Visit: 1 Visit OT Treatments $Neuromuscular Re-education: 8-22 mins  [facility], OTR/L [phone]    Lucille Passy M 06/04/2017, 11:40 AM

## 2017-06-04 NOTE — Discharge Summary (Signed)
BP 116/84 (BP Location: Left Arm)   Pulse (!) 105   Temp 97.6 F (36.4 C) (Oral)   Resp 14   Ht 5' 5"  (1.651 m)   Wt 88.5 kg (195 lb)   SpO2 96%   BMI 32.45 kg/m  Physician Discharge Summary  Patient ID: Jamie Bell MRN: [id number] DOB/AGE: [birth year] 47 y.o.  Admit date: 05/28/2017 Discharge date: 06/04/2017  Admission Diagnoses:Cervical epidural abscess, Cervical osteomyelitis  Discharge Diagnoses:  Principal Problem:   Abscess in epidural space of cervical spine Active Problems:   DM (diabetes mellitus), type 2 (HCC)   POLYCYSTIC OVARIAN DISEASE   TOBACCO USER   HYPERTENSION, BENIGN   Opiate dependence ([place])   Spinal stenosis in cervical region   Type 2 diabetes mellitus with diabetic chronic kidney disease (HCC)   Anxiety state   Depression   Chronic back pain   Cirrhosis ([place])   IVDU (intravenous drug user)   Status post cervical spinal fusion   Discharged Condition: good  Hospital Course: [name] was admitted directly from the ED and taken to the operating room for the acute onset of left deltoid weakness, and MRI evidence of osteomyelitis C3/4, and an epidural abscess in the cervical spinal canal. I performed the ACDF and found pus in the longus colli, and in the epidural space. Cultures taken in the OR grew staph epidermidis. She will be sent home with IV antibiotics and followed by the ID service. She post op had some swelling in the neck, but has been able to eat, and breathe normally. She has been quite hoarse since admission, and has 1/5 strength in the left deltoid.  Her wound is clean, dry, and without signs of infection at discharge. She is ambulating, voiding, and tolerating a regular diet. [name] will have [place], OT, and nursing.   Treatments: surgery: Anterior Cervical decompression C3/4 Arthrodesis C3-4 with 29m structural allograft Anterior instrumentation(Nuvasive) C3-4     Discharge Exam: Blood pressure 116/84, pulse  (!) 105, temperature 97.6 F (36.4 C), temperature source Oral, resp. rate 14, height 5' 5"  (1.651 m), weight 88.5 kg (195 lb), SpO2 96 %. General appearance: alert, cooperative, appears stated age and no distress Neuro exam is normal except for weakness in the left deltoid.  Disposition:  C3-C4 Discharge Instructions    Home infusion instructions [facility] May follow [facility]osing Protocol; May administer Cathflo as needed to maintain patency of vascular access device.; Flushing of vascular access device: [facility]: 0.9% NaCl pre/post medica...   Complete by:  As directed    Instructions:  May follow [place]osing Protocol   Instructions:  May administer Cathflo as needed to maintain patency of vascular access device.   Instructions:  Flushing of vascular access device: per [facility]: 0.9% NaCl pre/post medication administration and prn patency; Heparin 100 u/ml, 547mfor implanted ports and Heparin 10u/ml, 68m4mor all other central venous catheters.   Instructions:  May follow [facility] Anaphylaxis Protocol for First Dose Administration in the home: 0.9% NaCl at 25-50 ml/hr to maintain IV access for protocol meds. Epinephrine 0.3 ml IV/IM PRN and Benadryl 25-50 IV/IM PRN s/s of anaphylaxis.   Instructions:  [facility] Coordinator (RN) to assist per patient IV care needs in the home PRN.     Allergies as of 06/04/2017      Reactions   Chantix [varenicline] Other (See Comments)   Vivid nightmares   Ciprofloxacin Swelling   Nsaids  Hives   Pt takes ibuprofen with no reaction   Shrimp [shellfish Allergy] Itching   Makes hands itch    Tylenol [acetaminophen] Other (See Comments)   Patient has cirrhosis of the liver and this causes elevated levels      Medication List    STOP taking these medications   ibuprofen 200 MG tablet Commonly known as:  ADVIL,MOTRIN   predniSONE 10 MG tablet Commonly known as:  DELTASONE     TAKE these medications    B-50 COMPLEX PO Take 1 tablet by mouth daily.   BC HEADACHE PO Take 1-2 packets by mouth 2 (two) times daily as needed (pain/headache).   BREO ELLIPTA 200-25 MCG/INH Aepb Generic drug:  fluticasone furoate-vilanterol Inhale 1 puff into the lungs daily.   Buprenorphine HCl-Naloxone HCl 8-2 MG Film Place 2 Film under the tongue daily.   cyclobenzaprine 10 MG tablet Commonly known as:  FLEXERIL Take 1 tablet (10 mg total) by mouth 2 (two) times daily as needed for muscle spasms.   fluconazole 100 MG tablet Commonly known as:  DIFLUCAN Take 1 tablet (100 mg total) by mouth daily. Start taking on:  06/05/2017   HAIR/SKIN/NAILS PO Take 1 tablet by mouth daily.   INVOKAMET [phone] MG Tabs Generic drug:  Canagliflozin-metFORMIN HCl Take 1 tablet by mouth 2 (two) times daily.   oxyCODONE 5 MG immediate release tablet Commonly known as:  ROXICODONE Take 1 tablet (5 mg total) by mouth every 6 (six) hours as needed for severe pain.   pantoprazole 40 MG tablet Commonly known as:  PROTONIX Take 1 tablet (40 mg total) by mouth 2 (two) times daily.   pravastatin 20 MG tablet Commonly known as:  PRAVACHOL Take 20 mg by mouth daily.   tiotropium 18 MCG inhalation capsule Commonly known as:  SPIRIVA Place 18 mcg into inhaler and inhale daily as needed (shortness of breath).   vancomycin IVPB Inject 1,000 mg into the vein every 12 (twelve) hours. Indication:  Osteomyelitis Last Day of Therapy:  07/09/17 Labs - Sunday/Monday:  CBC/D, BMP, and vancomycin trough. Labs - Thursday:  BMP and vancomycin trough Labs - Every other week:  ESR and CRP            Home Infusion Instuctions  (From admission, onward)        Start     Ordered   06/04/17 0000  Home infusion instructions [facility] May follow [facility] Dosing Protocol; May administer Cathflo as needed to maintain patency of vascular access device.; Flushing of vascular access device: per [facility] Protocol: 0.9% NaCl  pre/post medica...    Question Answer Comment  Instructions May follow [facility] Dosing Protocol   Instructions May administer Cathflo as needed to maintain patency of vascular access device.   Instructions Flushing of vascular access device: per [facility] Protocol: 0.9% NaCl pre/post medication administration and prn patency; Heparin 100 u/ml, 5ml for implanted ports and Heparin 10u/ml, 5ml for all other central venous catheters.   Instructions May follow [facility] Anaphylaxis Protocol for First Dose Administration in the home: 0.9% NaCl at 25-50 ml/hr to maintain IV access for protocol meds. Epinephrine 0.3 ml IV/IM PRN and Benadryl 25-50 IV/IM PRN s/s of anaphylaxis.   Instructions [facility] Infusion Coordinator (RN) to assist per patient IV care needs in the home PRN.      05 /10/19 1157     Follow-up Information    Ashok Pall, MD Follow up in 3 week(s).   Specialty:  Neurosurgery Why:  please call to make an appointment for 3 weeks Contact information: [address] [address] [place] [address] [phone]           Signed: Winfield Cunas 06/04/2017, 11:58 AM

## 2017-06-04 NOTE — Progress Notes (Signed)
Discharge instructions discussed with pt and pt mom, verbalized understanding of information of information with follow up appointments and medications, pt will be discharged home with mom who is driving pt home.

## 2017-06-05 DIAGNOSIS — N189 Chronic kidney disease, unspecified: Secondary | ICD-10-CM | POA: Diagnosis not present

## 2017-06-05 DIAGNOSIS — I129 Hypertensive chronic kidney disease with stage 1 through stage 4 chronic kidney disease, or unspecified chronic kidney disease: Secondary | ICD-10-CM | POA: Diagnosis not present

## 2017-06-05 DIAGNOSIS — Z4789 Encounter for other orthopedic aftercare: Secondary | ICD-10-CM | POA: Diagnosis not present

## 2017-06-05 DIAGNOSIS — Z981 Arthrodesis status: Secondary | ICD-10-CM | POA: Diagnosis not present

## 2017-06-05 DIAGNOSIS — M4622 Osteomyelitis of vertebra, cervical region: Secondary | ICD-10-CM | POA: Diagnosis not present

## 2017-06-05 DIAGNOSIS — Z452 Encounter for adjustment and management of vascular access device: Secondary | ICD-10-CM | POA: Diagnosis not present

## 2017-06-05 DIAGNOSIS — G062 Extradural and subdural abscess, unspecified: Secondary | ICD-10-CM | POA: Diagnosis not present

## 2017-06-05 DIAGNOSIS — Z792 Long term (current) use of antibiotics: Secondary | ICD-10-CM | POA: Diagnosis not present

## 2017-06-05 DIAGNOSIS — M48062 Spinal stenosis, lumbar region with neurogenic claudication: Secondary | ICD-10-CM | POA: Diagnosis not present

## 2017-06-05 DIAGNOSIS — Z5181 Encounter for therapeutic drug level monitoring: Secondary | ICD-10-CM | POA: Diagnosis not present

## 2017-06-05 DIAGNOSIS — Z79891 Long term (current) use of opiate analgesic: Secondary | ICD-10-CM | POA: Diagnosis not present

## 2017-06-05 DIAGNOSIS — Z7951 Long term (current) use of inhaled steroids: Secondary | ICD-10-CM | POA: Diagnosis not present

## 2017-06-05 DIAGNOSIS — E1122 Type 2 diabetes mellitus with diabetic chronic kidney disease: Secondary | ICD-10-CM | POA: Diagnosis not present

## 2017-06-05 DIAGNOSIS — E1169 Type 2 diabetes mellitus with other specified complication: Secondary | ICD-10-CM | POA: Diagnosis not present

## 2017-06-07 ENCOUNTER — Encounter: Payer: Self-pay | Admitting: Infectious Diseases

## 2017-06-07 DIAGNOSIS — Z981 Arthrodesis status: Secondary | ICD-10-CM | POA: Diagnosis not present

## 2017-06-07 DIAGNOSIS — Z4789 Encounter for other orthopedic aftercare: Secondary | ICD-10-CM | POA: Diagnosis not present

## 2017-06-07 DIAGNOSIS — Z792 Long term (current) use of antibiotics: Secondary | ICD-10-CM | POA: Diagnosis not present

## 2017-06-09 DIAGNOSIS — Z7951 Long term (current) use of inhaled steroids: Secondary | ICD-10-CM | POA: Diagnosis not present

## 2017-06-09 DIAGNOSIS — E1169 Type 2 diabetes mellitus with other specified complication: Secondary | ICD-10-CM | POA: Diagnosis not present

## 2017-06-09 DIAGNOSIS — E1122 Type 2 diabetes mellitus with diabetic chronic kidney disease: Secondary | ICD-10-CM | POA: Diagnosis not present

## 2017-06-09 DIAGNOSIS — Z4789 Encounter for other orthopedic aftercare: Secondary | ICD-10-CM | POA: Diagnosis not present

## 2017-06-09 DIAGNOSIS — G062 Extradural and subdural abscess, unspecified: Secondary | ICD-10-CM | POA: Diagnosis not present

## 2017-06-09 DIAGNOSIS — N189 Chronic kidney disease, unspecified: Secondary | ICD-10-CM | POA: Diagnosis not present

## 2017-06-09 DIAGNOSIS — Z5181 Encounter for therapeutic drug level monitoring: Secondary | ICD-10-CM | POA: Diagnosis not present

## 2017-06-09 DIAGNOSIS — M4622 Osteomyelitis of vertebra, cervical region: Secondary | ICD-10-CM | POA: Diagnosis not present

## 2017-06-09 DIAGNOSIS — Z981 Arthrodesis status: Secondary | ICD-10-CM | POA: Diagnosis not present

## 2017-06-09 DIAGNOSIS — Z452 Encounter for adjustment and management of vascular access device: Secondary | ICD-10-CM | POA: Diagnosis not present

## 2017-06-09 DIAGNOSIS — M48062 Spinal stenosis, lumbar region with neurogenic claudication: Secondary | ICD-10-CM | POA: Diagnosis not present

## 2017-06-09 DIAGNOSIS — Z792 Long term (current) use of antibiotics: Secondary | ICD-10-CM | POA: Diagnosis not present

## 2017-06-09 DIAGNOSIS — I129 Hypertensive chronic kidney disease with stage 1 through stage 4 chronic kidney disease, or unspecified chronic kidney disease: Secondary | ICD-10-CM | POA: Diagnosis not present

## 2017-06-09 DIAGNOSIS — Z79891 Long term (current) use of opiate analgesic: Secondary | ICD-10-CM | POA: Diagnosis not present

## 2017-06-10 DIAGNOSIS — Z792 Long term (current) use of antibiotics: Secondary | ICD-10-CM | POA: Diagnosis not present

## 2017-06-10 DIAGNOSIS — Z981 Arthrodesis status: Secondary | ICD-10-CM | POA: Diagnosis not present

## 2017-06-10 DIAGNOSIS — Z4789 Encounter for other orthopedic aftercare: Secondary | ICD-10-CM | POA: Diagnosis not present

## 2017-06-11 ENCOUNTER — Encounter (HOSPITAL_COMMUNITY): Payer: Self-pay

## 2017-06-11 ENCOUNTER — Other Ambulatory Visit: Payer: Self-pay | Admitting: Pharmacist

## 2017-06-11 DIAGNOSIS — I129 Hypertensive chronic kidney disease with stage 1 through stage 4 chronic kidney disease, or unspecified chronic kidney disease: Secondary | ICD-10-CM | POA: Diagnosis not present

## 2017-06-11 DIAGNOSIS — N189 Chronic kidney disease, unspecified: Secondary | ICD-10-CM | POA: Diagnosis not present

## 2017-06-11 DIAGNOSIS — Z7951 Long term (current) use of inhaled steroids: Secondary | ICD-10-CM | POA: Diagnosis not present

## 2017-06-11 DIAGNOSIS — M48062 Spinal stenosis, lumbar region with neurogenic claudication: Secondary | ICD-10-CM | POA: Diagnosis not present

## 2017-06-11 DIAGNOSIS — E1122 Type 2 diabetes mellitus with diabetic chronic kidney disease: Secondary | ICD-10-CM | POA: Diagnosis not present

## 2017-06-11 DIAGNOSIS — Z4789 Encounter for other orthopedic aftercare: Secondary | ICD-10-CM | POA: Diagnosis not present

## 2017-06-11 DIAGNOSIS — Z981 Arthrodesis status: Secondary | ICD-10-CM | POA: Diagnosis not present

## 2017-06-11 DIAGNOSIS — Z792 Long term (current) use of antibiotics: Secondary | ICD-10-CM | POA: Diagnosis not present

## 2017-06-11 DIAGNOSIS — E1169 Type 2 diabetes mellitus with other specified complication: Secondary | ICD-10-CM | POA: Diagnosis not present

## 2017-06-11 DIAGNOSIS — M4622 Osteomyelitis of vertebra, cervical region: Secondary | ICD-10-CM | POA: Diagnosis not present

## 2017-06-11 DIAGNOSIS — Z452 Encounter for adjustment and management of vascular access device: Secondary | ICD-10-CM | POA: Diagnosis not present

## 2017-06-11 DIAGNOSIS — Z79891 Long term (current) use of opiate analgesic: Secondary | ICD-10-CM | POA: Diagnosis not present

## 2017-06-11 DIAGNOSIS — G062 Extradural and subdural abscess, unspecified: Secondary | ICD-10-CM | POA: Diagnosis not present

## 2017-06-11 DIAGNOSIS — Z5181 Encounter for therapeutic drug level monitoring: Secondary | ICD-10-CM | POA: Diagnosis not present

## 2017-06-11 NOTE — Progress Notes (Signed)
[facility] pharmacy lab review

## 2017-06-12 DIAGNOSIS — R7881 Bacteremia: Secondary | ICD-10-CM | POA: Diagnosis not present

## 2017-06-12 DIAGNOSIS — A4902 Methicillin resistant Staphylococcus aureus infection, unspecified site: Secondary | ICD-10-CM | POA: Diagnosis not present

## 2017-06-12 DIAGNOSIS — G061 Intraspinal abscess and granuloma: Secondary | ICD-10-CM | POA: Diagnosis not present

## 2017-06-14 ENCOUNTER — Encounter: Payer: Self-pay | Admitting: Infectious Diseases

## 2017-06-14 DIAGNOSIS — N189 Chronic kidney disease, unspecified: Secondary | ICD-10-CM | POA: Diagnosis not present

## 2017-06-14 DIAGNOSIS — Z981 Arthrodesis status: Secondary | ICD-10-CM | POA: Diagnosis not present

## 2017-06-14 DIAGNOSIS — E1122 Type 2 diabetes mellitus with diabetic chronic kidney disease: Secondary | ICD-10-CM | POA: Diagnosis not present

## 2017-06-14 DIAGNOSIS — E1169 Type 2 diabetes mellitus with other specified complication: Secondary | ICD-10-CM | POA: Diagnosis not present

## 2017-06-14 DIAGNOSIS — Z5181 Encounter for therapeutic drug level monitoring: Secondary | ICD-10-CM | POA: Diagnosis not present

## 2017-06-14 DIAGNOSIS — Z792 Long term (current) use of antibiotics: Secondary | ICD-10-CM | POA: Diagnosis not present

## 2017-06-14 DIAGNOSIS — I129 Hypertensive chronic kidney disease with stage 1 through stage 4 chronic kidney disease, or unspecified chronic kidney disease: Secondary | ICD-10-CM | POA: Diagnosis not present

## 2017-06-14 DIAGNOSIS — G062 Extradural and subdural abscess, unspecified: Secondary | ICD-10-CM | POA: Diagnosis not present

## 2017-06-14 DIAGNOSIS — Z79891 Long term (current) use of opiate analgesic: Secondary | ICD-10-CM | POA: Diagnosis not present

## 2017-06-14 DIAGNOSIS — Z7951 Long term (current) use of inhaled steroids: Secondary | ICD-10-CM | POA: Diagnosis not present

## 2017-06-14 DIAGNOSIS — M4622 Osteomyelitis of vertebra, cervical region: Secondary | ICD-10-CM | POA: Diagnosis not present

## 2017-06-14 DIAGNOSIS — Z4789 Encounter for other orthopedic aftercare: Secondary | ICD-10-CM | POA: Diagnosis not present

## 2017-06-14 DIAGNOSIS — Z452 Encounter for adjustment and management of vascular access device: Secondary | ICD-10-CM | POA: Diagnosis not present

## 2017-06-14 DIAGNOSIS — M48062 Spinal stenosis, lumbar region with neurogenic claudication: Secondary | ICD-10-CM | POA: Diagnosis not present

## 2017-06-16 DIAGNOSIS — Z5181 Encounter for therapeutic drug level monitoring: Secondary | ICD-10-CM | POA: Diagnosis not present

## 2017-06-16 DIAGNOSIS — Z792 Long term (current) use of antibiotics: Secondary | ICD-10-CM | POA: Diagnosis not present

## 2017-06-16 DIAGNOSIS — M4622 Osteomyelitis of vertebra, cervical region: Secondary | ICD-10-CM | POA: Diagnosis not present

## 2017-06-16 DIAGNOSIS — E1169 Type 2 diabetes mellitus with other specified complication: Secondary | ICD-10-CM | POA: Diagnosis not present

## 2017-06-16 DIAGNOSIS — Z79891 Long term (current) use of opiate analgesic: Secondary | ICD-10-CM | POA: Diagnosis not present

## 2017-06-16 DIAGNOSIS — Z7951 Long term (current) use of inhaled steroids: Secondary | ICD-10-CM | POA: Diagnosis not present

## 2017-06-16 DIAGNOSIS — Z452 Encounter for adjustment and management of vascular access device: Secondary | ICD-10-CM | POA: Diagnosis not present

## 2017-06-16 DIAGNOSIS — Z981 Arthrodesis status: Secondary | ICD-10-CM | POA: Diagnosis not present

## 2017-06-16 DIAGNOSIS — G062 Extradural and subdural abscess, unspecified: Secondary | ICD-10-CM | POA: Diagnosis not present

## 2017-06-16 DIAGNOSIS — Z4789 Encounter for other orthopedic aftercare: Secondary | ICD-10-CM | POA: Diagnosis not present

## 2017-06-16 DIAGNOSIS — E1122 Type 2 diabetes mellitus with diabetic chronic kidney disease: Secondary | ICD-10-CM | POA: Diagnosis not present

## 2017-06-16 DIAGNOSIS — M48062 Spinal stenosis, lumbar region with neurogenic claudication: Secondary | ICD-10-CM | POA: Diagnosis not present

## 2017-06-16 DIAGNOSIS — I129 Hypertensive chronic kidney disease with stage 1 through stage 4 chronic kidney disease, or unspecified chronic kidney disease: Secondary | ICD-10-CM | POA: Diagnosis not present

## 2017-06-16 DIAGNOSIS — N189 Chronic kidney disease, unspecified: Secondary | ICD-10-CM | POA: Diagnosis not present

## 2017-06-17 ENCOUNTER — Encounter: Payer: Self-pay | Admitting: Infectious Diseases

## 2017-06-17 DIAGNOSIS — N189 Chronic kidney disease, unspecified: Secondary | ICD-10-CM | POA: Diagnosis not present

## 2017-06-17 DIAGNOSIS — I129 Hypertensive chronic kidney disease with stage 1 through stage 4 chronic kidney disease, or unspecified chronic kidney disease: Secondary | ICD-10-CM | POA: Diagnosis not present

## 2017-06-17 DIAGNOSIS — Z452 Encounter for adjustment and management of vascular access device: Secondary | ICD-10-CM | POA: Diagnosis not present

## 2017-06-17 DIAGNOSIS — G062 Extradural and subdural abscess, unspecified: Secondary | ICD-10-CM | POA: Diagnosis not present

## 2017-06-17 DIAGNOSIS — R030 Elevated blood-pressure reading, without diagnosis of hypertension: Secondary | ICD-10-CM | POA: Diagnosis not present

## 2017-06-17 DIAGNOSIS — Z4789 Encounter for other orthopedic aftercare: Secondary | ICD-10-CM | POA: Diagnosis not present

## 2017-06-17 DIAGNOSIS — M4622 Osteomyelitis of vertebra, cervical region: Secondary | ICD-10-CM | POA: Diagnosis not present

## 2017-06-17 DIAGNOSIS — Z792 Long term (current) use of antibiotics: Secondary | ICD-10-CM | POA: Diagnosis not present

## 2017-06-17 DIAGNOSIS — Z981 Arthrodesis status: Secondary | ICD-10-CM | POA: Diagnosis not present

## 2017-06-17 DIAGNOSIS — M48062 Spinal stenosis, lumbar region with neurogenic claudication: Secondary | ICD-10-CM | POA: Diagnosis not present

## 2017-06-17 DIAGNOSIS — Z5181 Encounter for therapeutic drug level monitoring: Secondary | ICD-10-CM | POA: Diagnosis not present

## 2017-06-17 DIAGNOSIS — E1169 Type 2 diabetes mellitus with other specified complication: Secondary | ICD-10-CM | POA: Diagnosis not present

## 2017-06-17 DIAGNOSIS — Z7951 Long term (current) use of inhaled steroids: Secondary | ICD-10-CM | POA: Diagnosis not present

## 2017-06-17 DIAGNOSIS — E1122 Type 2 diabetes mellitus with diabetic chronic kidney disease: Secondary | ICD-10-CM | POA: Diagnosis not present

## 2017-06-17 DIAGNOSIS — Z79891 Long term (current) use of opiate analgesic: Secondary | ICD-10-CM | POA: Diagnosis not present

## 2017-06-17 DIAGNOSIS — M542 Cervicalgia: Secondary | ICD-10-CM | POA: Diagnosis not present

## 2017-06-18 ENCOUNTER — Other Ambulatory Visit: Payer: Self-pay | Admitting: Pharmacist

## 2017-06-18 DIAGNOSIS — Z7951 Long term (current) use of inhaled steroids: Secondary | ICD-10-CM | POA: Diagnosis not present

## 2017-06-18 DIAGNOSIS — Z792 Long term (current) use of antibiotics: Secondary | ICD-10-CM | POA: Diagnosis not present

## 2017-06-18 DIAGNOSIS — G062 Extradural and subdural abscess, unspecified: Secondary | ICD-10-CM | POA: Diagnosis not present

## 2017-06-18 DIAGNOSIS — E1169 Type 2 diabetes mellitus with other specified complication: Secondary | ICD-10-CM | POA: Diagnosis not present

## 2017-06-18 DIAGNOSIS — Z5181 Encounter for therapeutic drug level monitoring: Secondary | ICD-10-CM | POA: Diagnosis not present

## 2017-06-18 DIAGNOSIS — I129 Hypertensive chronic kidney disease with stage 1 through stage 4 chronic kidney disease, or unspecified chronic kidney disease: Secondary | ICD-10-CM | POA: Diagnosis not present

## 2017-06-18 DIAGNOSIS — Z981 Arthrodesis status: Secondary | ICD-10-CM | POA: Diagnosis not present

## 2017-06-18 DIAGNOSIS — Z79891 Long term (current) use of opiate analgesic: Secondary | ICD-10-CM | POA: Diagnosis not present

## 2017-06-18 DIAGNOSIS — Z4789 Encounter for other orthopedic aftercare: Secondary | ICD-10-CM | POA: Diagnosis not present

## 2017-06-18 DIAGNOSIS — N189 Chronic kidney disease, unspecified: Secondary | ICD-10-CM | POA: Diagnosis not present

## 2017-06-18 DIAGNOSIS — M4622 Osteomyelitis of vertebra, cervical region: Secondary | ICD-10-CM | POA: Diagnosis not present

## 2017-06-18 DIAGNOSIS — M48062 Spinal stenosis, lumbar region with neurogenic claudication: Secondary | ICD-10-CM | POA: Diagnosis not present

## 2017-06-18 DIAGNOSIS — E1122 Type 2 diabetes mellitus with diabetic chronic kidney disease: Secondary | ICD-10-CM | POA: Diagnosis not present

## 2017-06-18 DIAGNOSIS — Z452 Encounter for adjustment and management of vascular access device: Secondary | ICD-10-CM | POA: Diagnosis not present

## 2017-06-18 NOTE — Progress Notes (Signed)
[facility] pharmacy lab review

## 2017-06-19 DIAGNOSIS — A4902 Methicillin resistant Staphylococcus aureus infection, unspecified site: Secondary | ICD-10-CM | POA: Diagnosis not present

## 2017-06-19 DIAGNOSIS — R7881 Bacteremia: Secondary | ICD-10-CM | POA: Diagnosis not present

## 2017-06-19 DIAGNOSIS — G061 Intraspinal abscess and granuloma: Secondary | ICD-10-CM | POA: Diagnosis not present

## 2017-06-21 ENCOUNTER — Other Ambulatory Visit (HOSPITAL_COMMUNITY)
Admission: RE | Admit: 2017-06-21 | Discharge: 2017-06-21 | Disposition: A | Discharge status: Home / Self Care | Payer: Medicare Other | Source: Other Acute Inpatient Hospital | Attending: Neurosurgery | Admitting: Neurosurgery

## 2017-06-21 DIAGNOSIS — Z4789 Encounter for other orthopedic aftercare: Secondary | ICD-10-CM | POA: Diagnosis not present

## 2017-06-21 DIAGNOSIS — Z792 Long term (current) use of antibiotics: Secondary | ICD-10-CM | POA: Diagnosis not present

## 2017-06-21 DIAGNOSIS — Z981 Arthrodesis status: Secondary | ICD-10-CM | POA: Diagnosis not present

## 2017-06-21 LAB — CBC WITH DIFFERENTIAL/PLATELET
BASOS ABS: 0 10*3/uL (ref 0.0–0.1)
Basophils Relative: 0 %
EOS ABS: 0.1 10*3/uL (ref 0.0–0.7)
Eosinophils Relative: 3 %
HEMATOCRIT: 42.4 % (ref 36.0–46.0)
Hemoglobin: 13.9 g/dL (ref 12.0–15.0)
Lymphocytes Relative: 28 %
Lymphs Abs: 1.5 10*3/uL (ref 0.7–4.0)
MCH: 31.4 pg (ref 26.0–34.0)
MCHC: 32.8 g/dL (ref 30.0–36.0)
MCV: 95.9 fL (ref 78.0–100.0)
Monocytes Absolute: 0.4 10*3/uL (ref 0.1–1.0)
Monocytes Relative: 8 %
Neutro Abs: 3.3 10*3/uL (ref 1.7–7.7)
Neutrophils Relative %: 61 %
Platelets: 147 10*3/uL — ABNORMAL LOW (ref 150–400)
RBC: 4.42 MIL/uL (ref 3.87–5.11)
RDW: 14.8 % (ref 11.5–15.5)
WBC: 5.4 10*3/uL (ref 4.0–10.5)

## 2017-06-21 LAB — BASIC METABOLIC PANEL
Anion gap: 9 (ref 5–15)
BUN: 12 mg/dL (ref 6–20)
CALCIUM: 8.7 mg/dL — AB (ref 8.9–10.3)
CO2: 27 mmol/L (ref 22–32)
CREATININE: 0.79 mg/dL (ref 0.44–1.00)
Chloride: 104 mmol/L (ref 101–111)
GFR calc Af Amer: >60 mL/min (ref >60)
Glucose, Bld: 126 mg/dL — ABNORMAL HIGH (ref 65–99)
Potassium: 3.2 mmol/L — ABNORMAL LOW (ref 3.5–5.1)
SODIUM: 140 mmol/L (ref 135–145)

## 2017-06-21 LAB — SEDIMENTATION RATE: Sed Rate: 24 mm/hr — ABNORMAL HIGH (ref 0–22)

## 2017-06-21 LAB — VANCOMYCIN, TROUGH: VANCOMYCIN TR: 13 ug/mL — AB (ref 15–20)

## 2017-06-23 DIAGNOSIS — Z981 Arthrodesis status: Secondary | ICD-10-CM | POA: Diagnosis not present

## 2017-06-23 DIAGNOSIS — Z7951 Long term (current) use of inhaled steroids: Secondary | ICD-10-CM | POA: Diagnosis not present

## 2017-06-23 DIAGNOSIS — Z452 Encounter for adjustment and management of vascular access device: Secondary | ICD-10-CM | POA: Diagnosis not present

## 2017-06-23 DIAGNOSIS — Z4789 Encounter for other orthopedic aftercare: Secondary | ICD-10-CM | POA: Diagnosis not present

## 2017-06-23 DIAGNOSIS — N189 Chronic kidney disease, unspecified: Secondary | ICD-10-CM | POA: Diagnosis not present

## 2017-06-23 DIAGNOSIS — M4622 Osteomyelitis of vertebra, cervical region: Secondary | ICD-10-CM | POA: Diagnosis not present

## 2017-06-23 DIAGNOSIS — Z792 Long term (current) use of antibiotics: Secondary | ICD-10-CM | POA: Diagnosis not present

## 2017-06-23 DIAGNOSIS — Z5181 Encounter for therapeutic drug level monitoring: Secondary | ICD-10-CM | POA: Diagnosis not present

## 2017-06-23 DIAGNOSIS — M48062 Spinal stenosis, lumbar region with neurogenic claudication: Secondary | ICD-10-CM | POA: Diagnosis not present

## 2017-06-23 DIAGNOSIS — I129 Hypertensive chronic kidney disease with stage 1 through stage 4 chronic kidney disease, or unspecified chronic kidney disease: Secondary | ICD-10-CM | POA: Diagnosis not present

## 2017-06-23 DIAGNOSIS — E1122 Type 2 diabetes mellitus with diabetic chronic kidney disease: Secondary | ICD-10-CM | POA: Diagnosis not present

## 2017-06-23 DIAGNOSIS — G062 Extradural and subdural abscess, unspecified: Secondary | ICD-10-CM | POA: Diagnosis not present

## 2017-06-23 DIAGNOSIS — Z79891 Long term (current) use of opiate analgesic: Secondary | ICD-10-CM | POA: Diagnosis not present

## 2017-06-23 DIAGNOSIS — E1169 Type 2 diabetes mellitus with other specified complication: Secondary | ICD-10-CM | POA: Diagnosis not present

## 2017-06-24 ENCOUNTER — Encounter: Payer: Self-pay | Admitting: Infectious Diseases

## 2017-06-24 DIAGNOSIS — Z452 Encounter for adjustment and management of vascular access device: Secondary | ICD-10-CM | POA: Diagnosis not present

## 2017-06-24 DIAGNOSIS — I129 Hypertensive chronic kidney disease with stage 1 through stage 4 chronic kidney disease, or unspecified chronic kidney disease: Secondary | ICD-10-CM | POA: Diagnosis not present

## 2017-06-24 DIAGNOSIS — Z792 Long term (current) use of antibiotics: Secondary | ICD-10-CM | POA: Diagnosis not present

## 2017-06-24 DIAGNOSIS — Z981 Arthrodesis status: Secondary | ICD-10-CM | POA: Diagnosis not present

## 2017-06-24 DIAGNOSIS — Z5181 Encounter for therapeutic drug level monitoring: Secondary | ICD-10-CM | POA: Diagnosis not present

## 2017-06-24 DIAGNOSIS — M48062 Spinal stenosis, lumbar region with neurogenic claudication: Secondary | ICD-10-CM | POA: Diagnosis not present

## 2017-06-24 DIAGNOSIS — Z79891 Long term (current) use of opiate analgesic: Secondary | ICD-10-CM | POA: Diagnosis not present

## 2017-06-24 DIAGNOSIS — G062 Extradural and subdural abscess, unspecified: Secondary | ICD-10-CM | POA: Diagnosis not present

## 2017-06-24 DIAGNOSIS — M4622 Osteomyelitis of vertebra, cervical region: Secondary | ICD-10-CM | POA: Diagnosis not present

## 2017-06-24 DIAGNOSIS — E1169 Type 2 diabetes mellitus with other specified complication: Secondary | ICD-10-CM | POA: Diagnosis not present

## 2017-06-24 DIAGNOSIS — Z7951 Long term (current) use of inhaled steroids: Secondary | ICD-10-CM | POA: Diagnosis not present

## 2017-06-24 DIAGNOSIS — N189 Chronic kidney disease, unspecified: Secondary | ICD-10-CM | POA: Diagnosis not present

## 2017-06-24 DIAGNOSIS — Z4789 Encounter for other orthopedic aftercare: Secondary | ICD-10-CM | POA: Diagnosis not present

## 2017-06-24 DIAGNOSIS — E1122 Type 2 diabetes mellitus with diabetic chronic kidney disease: Secondary | ICD-10-CM | POA: Diagnosis not present

## 2017-06-25 ENCOUNTER — Other Ambulatory Visit: Payer: Self-pay | Admitting: Pharmacist

## 2017-06-25 DIAGNOSIS — G062 Extradural and subdural abscess, unspecified: Secondary | ICD-10-CM | POA: Diagnosis not present

## 2017-06-25 DIAGNOSIS — Z981 Arthrodesis status: Secondary | ICD-10-CM | POA: Diagnosis not present

## 2017-06-25 DIAGNOSIS — E1122 Type 2 diabetes mellitus with diabetic chronic kidney disease: Secondary | ICD-10-CM | POA: Diagnosis not present

## 2017-06-25 DIAGNOSIS — Z792 Long term (current) use of antibiotics: Secondary | ICD-10-CM | POA: Diagnosis not present

## 2017-06-25 DIAGNOSIS — Z79891 Long term (current) use of opiate analgesic: Secondary | ICD-10-CM | POA: Diagnosis not present

## 2017-06-25 DIAGNOSIS — E1169 Type 2 diabetes mellitus with other specified complication: Secondary | ICD-10-CM | POA: Diagnosis not present

## 2017-06-25 DIAGNOSIS — Z4789 Encounter for other orthopedic aftercare: Secondary | ICD-10-CM | POA: Diagnosis not present

## 2017-06-25 DIAGNOSIS — M4622 Osteomyelitis of vertebra, cervical region: Secondary | ICD-10-CM | POA: Diagnosis not present

## 2017-06-25 DIAGNOSIS — M48062 Spinal stenosis, lumbar region with neurogenic claudication: Secondary | ICD-10-CM | POA: Diagnosis not present

## 2017-06-25 DIAGNOSIS — I129 Hypertensive chronic kidney disease with stage 1 through stage 4 chronic kidney disease, or unspecified chronic kidney disease: Secondary | ICD-10-CM | POA: Diagnosis not present

## 2017-06-25 DIAGNOSIS — N189 Chronic kidney disease, unspecified: Secondary | ICD-10-CM | POA: Diagnosis not present

## 2017-06-25 DIAGNOSIS — Z5181 Encounter for therapeutic drug level monitoring: Secondary | ICD-10-CM | POA: Diagnosis not present

## 2017-06-25 DIAGNOSIS — Z7951 Long term (current) use of inhaled steroids: Secondary | ICD-10-CM | POA: Diagnosis not present

## 2017-06-25 DIAGNOSIS — Z452 Encounter for adjustment and management of vascular access device: Secondary | ICD-10-CM | POA: Diagnosis not present

## 2017-06-25 NOTE — Progress Notes (Signed)
[facility] pharmacy lab review

## 2017-06-26 DIAGNOSIS — R7881 Bacteremia: Secondary | ICD-10-CM | POA: Diagnosis not present

## 2017-06-26 DIAGNOSIS — G061 Intraspinal abscess and granuloma: Secondary | ICD-10-CM | POA: Diagnosis not present

## 2017-06-26 DIAGNOSIS — A4902 Methicillin resistant Staphylococcus aureus infection, unspecified site: Secondary | ICD-10-CM | POA: Diagnosis not present

## 2017-06-28 DIAGNOSIS — Z5181 Encounter for therapeutic drug level monitoring: Secondary | ICD-10-CM | POA: Diagnosis not present

## 2017-06-28 DIAGNOSIS — Z4789 Encounter for other orthopedic aftercare: Secondary | ICD-10-CM | POA: Diagnosis not present

## 2017-06-28 DIAGNOSIS — M4622 Osteomyelitis of vertebra, cervical region: Secondary | ICD-10-CM | POA: Diagnosis not present

## 2017-06-28 DIAGNOSIS — G062 Extradural and subdural abscess, unspecified: Secondary | ICD-10-CM | POA: Diagnosis not present

## 2017-06-28 DIAGNOSIS — Z981 Arthrodesis status: Secondary | ICD-10-CM | POA: Diagnosis not present

## 2017-06-29 ENCOUNTER — Inpatient Hospital Stay: Payer: Self-pay | Admitting: Infectious Diseases

## 2017-06-29 DIAGNOSIS — Z452 Encounter for adjustment and management of vascular access device: Secondary | ICD-10-CM | POA: Diagnosis not present

## 2017-06-29 DIAGNOSIS — E1122 Type 2 diabetes mellitus with diabetic chronic kidney disease: Secondary | ICD-10-CM | POA: Diagnosis not present

## 2017-06-29 DIAGNOSIS — M48062 Spinal stenosis, lumbar region with neurogenic claudication: Secondary | ICD-10-CM | POA: Diagnosis not present

## 2017-06-29 DIAGNOSIS — Z792 Long term (current) use of antibiotics: Secondary | ICD-10-CM | POA: Diagnosis not present

## 2017-06-29 DIAGNOSIS — Z5181 Encounter for therapeutic drug level monitoring: Secondary | ICD-10-CM | POA: Diagnosis not present

## 2017-06-29 DIAGNOSIS — E1169 Type 2 diabetes mellitus with other specified complication: Secondary | ICD-10-CM | POA: Diagnosis not present

## 2017-06-29 DIAGNOSIS — G062 Extradural and subdural abscess, unspecified: Secondary | ICD-10-CM | POA: Diagnosis not present

## 2017-06-29 DIAGNOSIS — Z4789 Encounter for other orthopedic aftercare: Secondary | ICD-10-CM | POA: Diagnosis not present

## 2017-06-29 DIAGNOSIS — N189 Chronic kidney disease, unspecified: Secondary | ICD-10-CM | POA: Diagnosis not present

## 2017-06-29 DIAGNOSIS — M4622 Osteomyelitis of vertebra, cervical region: Secondary | ICD-10-CM | POA: Diagnosis not present

## 2017-06-29 DIAGNOSIS — Z79891 Long term (current) use of opiate analgesic: Secondary | ICD-10-CM | POA: Diagnosis not present

## 2017-06-29 DIAGNOSIS — Z7951 Long term (current) use of inhaled steroids: Secondary | ICD-10-CM | POA: Diagnosis not present

## 2017-06-29 DIAGNOSIS — Z981 Arthrodesis status: Secondary | ICD-10-CM | POA: Diagnosis not present

## 2017-06-29 DIAGNOSIS — I129 Hypertensive chronic kidney disease with stage 1 through stage 4 chronic kidney disease, or unspecified chronic kidney disease: Secondary | ICD-10-CM | POA: Diagnosis not present

## 2017-06-30 DIAGNOSIS — Z4789 Encounter for other orthopedic aftercare: Secondary | ICD-10-CM | POA: Diagnosis not present

## 2017-06-30 DIAGNOSIS — Z452 Encounter for adjustment and management of vascular access device: Secondary | ICD-10-CM | POA: Diagnosis not present

## 2017-06-30 DIAGNOSIS — G062 Extradural and subdural abscess, unspecified: Secondary | ICD-10-CM | POA: Diagnosis not present

## 2017-06-30 DIAGNOSIS — Z981 Arthrodesis status: Secondary | ICD-10-CM | POA: Diagnosis not present

## 2017-06-30 DIAGNOSIS — Z79891 Long term (current) use of opiate analgesic: Secondary | ICD-10-CM | POA: Diagnosis not present

## 2017-06-30 DIAGNOSIS — Z792 Long term (current) use of antibiotics: Secondary | ICD-10-CM | POA: Diagnosis not present

## 2017-06-30 DIAGNOSIS — M48062 Spinal stenosis, lumbar region with neurogenic claudication: Secondary | ICD-10-CM | POA: Diagnosis not present

## 2017-06-30 DIAGNOSIS — M4622 Osteomyelitis of vertebra, cervical region: Secondary | ICD-10-CM | POA: Diagnosis not present

## 2017-06-30 DIAGNOSIS — Z7951 Long term (current) use of inhaled steroids: Secondary | ICD-10-CM | POA: Diagnosis not present

## 2017-06-30 DIAGNOSIS — E1169 Type 2 diabetes mellitus with other specified complication: Secondary | ICD-10-CM | POA: Diagnosis not present

## 2017-06-30 DIAGNOSIS — I129 Hypertensive chronic kidney disease with stage 1 through stage 4 chronic kidney disease, or unspecified chronic kidney disease: Secondary | ICD-10-CM | POA: Diagnosis not present

## 2017-06-30 DIAGNOSIS — Z5181 Encounter for therapeutic drug level monitoring: Secondary | ICD-10-CM | POA: Diagnosis not present

## 2017-06-30 DIAGNOSIS — E1122 Type 2 diabetes mellitus with diabetic chronic kidney disease: Secondary | ICD-10-CM | POA: Diagnosis not present

## 2017-06-30 DIAGNOSIS — N189 Chronic kidney disease, unspecified: Secondary | ICD-10-CM | POA: Diagnosis not present

## 2017-07-01 DIAGNOSIS — Z792 Long term (current) use of antibiotics: Secondary | ICD-10-CM | POA: Diagnosis not present

## 2017-07-01 DIAGNOSIS — Z4789 Encounter for other orthopedic aftercare: Secondary | ICD-10-CM | POA: Diagnosis not present

## 2017-07-01 DIAGNOSIS — Z981 Arthrodesis status: Secondary | ICD-10-CM | POA: Diagnosis not present

## 2017-07-02 ENCOUNTER — Telehealth: Payer: Self-pay | Admitting: Pharmacist

## 2017-07-02 ENCOUNTER — Other Ambulatory Visit: Payer: Self-pay | Admitting: Pharmacist

## 2017-07-02 NOTE — Telephone Encounter (Signed)
Called patient regarding potassium results from 6/3 of 3.2.  Seems like she had some low potassium in the hospital as well. I was going to see if she had any symptoms and send in some oral potassium supplementation for her. She did not answer, will try again later.

## 2017-07-02 NOTE — Progress Notes (Signed)
[facility] pharmacy lab review

## 2017-07-03 DIAGNOSIS — A4902 Methicillin resistant Staphylococcus aureus infection, unspecified site: Secondary | ICD-10-CM | POA: Diagnosis not present

## 2017-07-03 DIAGNOSIS — G061 Intraspinal abscess and granuloma: Secondary | ICD-10-CM | POA: Diagnosis not present

## 2017-07-03 DIAGNOSIS — R7881 Bacteremia: Secondary | ICD-10-CM | POA: Diagnosis not present

## 2017-07-05 DIAGNOSIS — Z981 Arthrodesis status: Secondary | ICD-10-CM | POA: Diagnosis not present

## 2017-07-05 DIAGNOSIS — Z792 Long term (current) use of antibiotics: Secondary | ICD-10-CM | POA: Diagnosis not present

## 2017-07-05 DIAGNOSIS — Z4789 Encounter for other orthopedic aftercare: Secondary | ICD-10-CM | POA: Diagnosis not present

## 2017-07-07 NOTE — Telephone Encounter (Signed)
Patient still has some low potassium -  6/3 3.2 6/7 3.1 6/10 3.1  I can't get in touch with her to send in some PO supplementation, and she no-showed her appointment with Dr. Johnnye Sima on 6/4. Patient is on vancomycin until presumed end date of 6/14 for osteo. Will forward to Dr. Johnnye Sima.

## 2017-07-08 DIAGNOSIS — M4622 Osteomyelitis of vertebra, cervical region: Secondary | ICD-10-CM | POA: Diagnosis not present

## 2017-07-08 DIAGNOSIS — Z79891 Long term (current) use of opiate analgesic: Secondary | ICD-10-CM | POA: Diagnosis not present

## 2017-07-08 DIAGNOSIS — I129 Hypertensive chronic kidney disease with stage 1 through stage 4 chronic kidney disease, or unspecified chronic kidney disease: Secondary | ICD-10-CM | POA: Diagnosis not present

## 2017-07-08 DIAGNOSIS — N189 Chronic kidney disease, unspecified: Secondary | ICD-10-CM | POA: Diagnosis not present

## 2017-07-08 DIAGNOSIS — Z981 Arthrodesis status: Secondary | ICD-10-CM | POA: Diagnosis not present

## 2017-07-08 DIAGNOSIS — Z5181 Encounter for therapeutic drug level monitoring: Secondary | ICD-10-CM | POA: Diagnosis not present

## 2017-07-08 DIAGNOSIS — Z452 Encounter for adjustment and management of vascular access device: Secondary | ICD-10-CM | POA: Diagnosis not present

## 2017-07-08 DIAGNOSIS — Z792 Long term (current) use of antibiotics: Secondary | ICD-10-CM | POA: Diagnosis not present

## 2017-07-08 DIAGNOSIS — Z7951 Long term (current) use of inhaled steroids: Secondary | ICD-10-CM | POA: Diagnosis not present

## 2017-07-08 DIAGNOSIS — E1122 Type 2 diabetes mellitus with diabetic chronic kidney disease: Secondary | ICD-10-CM | POA: Diagnosis not present

## 2017-07-08 DIAGNOSIS — G062 Extradural and subdural abscess, unspecified: Secondary | ICD-10-CM | POA: Diagnosis not present

## 2017-07-08 DIAGNOSIS — M48062 Spinal stenosis, lumbar region with neurogenic claudication: Secondary | ICD-10-CM | POA: Diagnosis not present

## 2017-07-08 DIAGNOSIS — Z4789 Encounter for other orthopedic aftercare: Secondary | ICD-10-CM | POA: Diagnosis not present

## 2017-07-08 DIAGNOSIS — E1169 Type 2 diabetes mellitus with other specified complication: Secondary | ICD-10-CM | POA: Diagnosis not present

## 2017-07-09 ENCOUNTER — Other Ambulatory Visit: Payer: Self-pay | Admitting: Pharmacist

## 2017-07-09 DIAGNOSIS — Z981 Arthrodesis status: Secondary | ICD-10-CM | POA: Diagnosis not present

## 2017-07-09 DIAGNOSIS — Z4789 Encounter for other orthopedic aftercare: Secondary | ICD-10-CM | POA: Diagnosis not present

## 2017-07-09 NOTE — Telephone Encounter (Signed)
Most recent potassium from 6/13 is 3.9, so it has resolved on its own.

## 2017-07-09 NOTE — Progress Notes (Signed)
[facility] pharmacy lab review

## 2017-07-09 NOTE — Telephone Encounter (Signed)
Thank you Cassie!

## 2017-07-12 DIAGNOSIS — Z7951 Long term (current) use of inhaled steroids: Secondary | ICD-10-CM | POA: Diagnosis not present

## 2017-07-12 DIAGNOSIS — Z981 Arthrodesis status: Secondary | ICD-10-CM | POA: Diagnosis not present

## 2017-07-12 DIAGNOSIS — N189 Chronic kidney disease, unspecified: Secondary | ICD-10-CM | POA: Diagnosis not present

## 2017-07-12 DIAGNOSIS — Z452 Encounter for adjustment and management of vascular access device: Secondary | ICD-10-CM | POA: Diagnosis not present

## 2017-07-12 DIAGNOSIS — I129 Hypertensive chronic kidney disease with stage 1 through stage 4 chronic kidney disease, or unspecified chronic kidney disease: Secondary | ICD-10-CM | POA: Diagnosis not present

## 2017-07-12 DIAGNOSIS — M4622 Osteomyelitis of vertebra, cervical region: Secondary | ICD-10-CM | POA: Diagnosis not present

## 2017-07-12 DIAGNOSIS — E1122 Type 2 diabetes mellitus with diabetic chronic kidney disease: Secondary | ICD-10-CM | POA: Diagnosis not present

## 2017-07-12 DIAGNOSIS — Z4789 Encounter for other orthopedic aftercare: Secondary | ICD-10-CM | POA: Diagnosis not present

## 2017-07-12 DIAGNOSIS — Z79891 Long term (current) use of opiate analgesic: Secondary | ICD-10-CM | POA: Diagnosis not present

## 2017-07-12 DIAGNOSIS — G062 Extradural and subdural abscess, unspecified: Secondary | ICD-10-CM | POA: Diagnosis not present

## 2017-07-12 DIAGNOSIS — Z5181 Encounter for therapeutic drug level monitoring: Secondary | ICD-10-CM | POA: Diagnosis not present

## 2017-07-12 DIAGNOSIS — E1169 Type 2 diabetes mellitus with other specified complication: Secondary | ICD-10-CM | POA: Diagnosis not present

## 2017-07-12 DIAGNOSIS — Z792 Long term (current) use of antibiotics: Secondary | ICD-10-CM | POA: Diagnosis not present

## 2017-07-12 DIAGNOSIS — M48062 Spinal stenosis, lumbar region with neurogenic claudication: Secondary | ICD-10-CM | POA: Diagnosis not present

## 2017-07-14 ENCOUNTER — Telehealth: Payer: Self-pay

## 2017-07-14 NOTE — Telephone Encounter (Signed)
Jeani Hawking from [facility] called today requesting a pull order for pt since she continued IV antibiotics on 07/09/17. No pull date is listed in pt's chart [facility] will pull PICC for pt today. Will inform DR. Hatcher that picc line is being pulled.  [place]

## 2017-07-15 ENCOUNTER — Telehealth: Payer: Self-pay | Admitting: Pharmacist

## 2017-07-15 DIAGNOSIS — Z981 Arthrodesis status: Secondary | ICD-10-CM | POA: Diagnosis not present

## 2017-07-15 DIAGNOSIS — Z4789 Encounter for other orthopedic aftercare: Secondary | ICD-10-CM | POA: Diagnosis not present

## 2017-07-15 NOTE — Telephone Encounter (Signed)
Jamie Bell called and wanted to verify that it is ok to pull patient's PICC line. I can't find pull PICC orders anywhere in the chart but Jamie Bell gave verbal to do so yesterday.   Patient missed first appointment with Jamie Bell on 6/4 but it is rescheduled for Monday 6/24.

## 2017-07-15 NOTE — Telephone Encounter (Signed)
Appears her PIC was pulled yesterday and she has f/u with me on the 24th thanks

## 2017-07-19 ENCOUNTER — Ambulatory Visit (INDEPENDENT_AMBULATORY_CARE_PROVIDER_SITE_OTHER): Payer: Medicare Other | Admitting: Infectious Diseases

## 2017-07-19 DIAGNOSIS — F1990 Other psychoactive substance use, unspecified, uncomplicated: Secondary | ICD-10-CM

## 2017-07-19 DIAGNOSIS — G061 Intraspinal abscess and granuloma: Secondary | ICD-10-CM

## 2017-07-19 DIAGNOSIS — E1122 Type 2 diabetes mellitus with diabetic chronic kidney disease: Secondary | ICD-10-CM | POA: Diagnosis not present

## 2017-07-19 NOTE — Assessment & Plan Note (Signed)
Her new sx are concerning.  Will set her up for MRI. Will call her with results.  If negative, eval by ENT.  Will check ESR and CRP today as well.  rtc in 2-3 weeks.

## 2017-07-19 NOTE — Assessment & Plan Note (Signed)
Has been staying clean.

## 2017-07-19 NOTE — Assessment & Plan Note (Signed)
FSG have been well controlled.

## 2017-07-19 NOTE — Progress Notes (Signed)
   Subjective:    Patient ID: Jamie Bell, female    DOB: 01-02-1971, 47 y.o.   MRN: [id number]  HPI 47 yo F with a history of obesity, diabetes, fatty liver, cirrhosis, IVDA, and chronic pain.  She underwent surgery for decompression from C7-T2 for cervical stenosis in 2015.   She woke up on 05/16/2017 with acute neck pain and hoarseness.  She was seen in the emergency department on 05/19/2017.  She was afebrile.  She was noted to be tender with palpation over her posterior neck and shoulders but no weakness was documented.  She was treated with empiric amoxicillin and prednisone.  She completed amoxicillin therapy on 05/26/2017.  She had progressively severe pain and began to develop left arm weakness.  She saw Dr. at [facility] who ordered a cervical MRI which revealed her prior C5-6 fusion and acute C3-4 discitis with epidural abscess. She was adm to [facility] on 5[facility]4 and underwent anterior decompression C/4 and allograft to same site.  1 of 4 Cx showed MRSE and she was started on IV vanco to end on 07-09-17. Her BCx were (-).   Since finishing vanco has had worsening of voice. (slightly improving now).  Has restarted smoking during the period also.  Spoke with neurosurgery this AM and is to have MRI scheduled.  Worried she has nasal connection to throat ("when I blow my nose, food comes out").   CRP 6.4 (06-24-17)  Review of Systems  Constitutional: Negative for appetite change, chills, fever and unexpected weight change.  HENT: Positive for voice change. Negative for ear discharge, ear pain, mouth sores, sinus pain and sore throat.   Respiratory: Positive for choking and shortness of breath.   Gastrointestinal: Negative for constipation and diarrhea.  Genitourinary: Negative for difficulty urinating.  Neurological: Positive for weakness and numbness.  numbness, weakness L arm since 2 days prior to surgery.  Please see HPI. All other systems reviewed and negative.      Objective:   Physical Exam  Constitutional: She is oriented to person, place, and time. She appears well-developed and well-nourished.  HENT:  Ears:  Mouth/Throat: Uvula is midline. Abnormal dentition. No oropharyngeal exudate. No tonsillar exudate.    Eyes: Pupils are equal, round, and reactive to light. EOM are normal.  Neck: Normal range of motion. Neck supple.  Cardiovascular: Normal rate, regular rhythm and normal heart sounds.  Pulmonary/Chest: Effort normal and breath sounds normal.  Abdominal: Soft. Bowel sounds are normal.  Musculoskeletal: Normal range of motion.  Neurological: She is alert and oriented to person, place, and time.  Skin: Skin is warm and dry.  Psychiatric: She has a normal mood and affect.       Assessment & Plan:

## 2017-08-02 ENCOUNTER — Ambulatory Visit (HOSPITAL_COMMUNITY)
Admission: RE | Admit: 2017-08-02 | Discharge: 2017-08-02 | Disposition: A | Discharge status: Home / Self Care | Payer: Medicare Other | Source: Ambulatory Visit | Attending: Infectious Diseases | Admitting: Infectious Diseases

## 2017-08-02 DIAGNOSIS — M4802 Spinal stenosis, cervical region: Secondary | ICD-10-CM | POA: Diagnosis not present

## 2017-08-02 DIAGNOSIS — E1122 Type 2 diabetes mellitus with diabetic chronic kidney disease: Secondary | ICD-10-CM | POA: Insufficient documentation

## 2017-08-02 DIAGNOSIS — R531 Weakness: Secondary | ICD-10-CM | POA: Diagnosis not present

## 2017-08-02 DIAGNOSIS — G061 Intraspinal abscess and granuloma: Secondary | ICD-10-CM

## 2017-08-02 DIAGNOSIS — Z981 Arthrodesis status: Secondary | ICD-10-CM | POA: Insufficient documentation

## 2017-08-02 DIAGNOSIS — R200 Anesthesia of skin: Secondary | ICD-10-CM | POA: Diagnosis not present

## 2017-08-02 DIAGNOSIS — R499 Unspecified voice and resonance disorder: Secondary | ICD-10-CM | POA: Diagnosis not present

## 2017-08-02 DIAGNOSIS — M542 Cervicalgia: Secondary | ICD-10-CM | POA: Diagnosis not present

## 2017-08-02 MED ORDER — GADOBENATE DIMEGLUMINE 529 MG/ML IV SOLN
20.0000 mL | Freq: Once | INTRAVENOUS | Status: AC | PRN
  Administered 2017-08-02: 20 mL via INTRAVENOUS

## 2017-08-10 ENCOUNTER — Telehealth: Payer: Self-pay | Admitting: Gastroenterology

## 2017-08-10 ENCOUNTER — Other Ambulatory Visit (HOSPITAL_COMMUNITY): Payer: Self-pay | Admitting: Pulmonary Disease

## 2017-08-10 DIAGNOSIS — Z1231 Encounter for screening mammogram for malignant neoplasm of breast: Secondary | ICD-10-CM

## 2017-08-10 NOTE — Telephone Encounter (Signed)
Pt LMOM that she needed to speak with LSL because a lot has been going on with her since she was last seen here. She also wanted to verify her OV with [place]. Please call her at [phone] or [phone]

## 2017-08-10 NOTE — Telephone Encounter (Addendum)
I called Jamie Bell and she said she had 2 ruptured discs in her neck after her last visit with Magda Paganini. She has an appointment on 09/21/2017 for follow up. However, she did not have labs done as she was supposed to the last time. She wants to know if Magda Paganini would like to do some labs before her apt. Also, She said she was given a prescription for a medication for constipation. But she did not like that. She is asking if Magda Paganini can send her in a prescription for Lactulose. She said her dad has the same problem as she does with her liver and he takes Lactulose. She tried a dose of his and it works Engineer, manufacturing. She usually has one Bm daily, but she still feels miserable.  Jamie Bell uses [facility] now and I have entered that pharmacy.  Magda Paganini, please advise!

## 2017-08-11 MED ORDER — LACTULOSE 10 GM/15ML PO SOLN: 20.0000 g | Freq: Two times a day (BID) | ORAL | 0 refills | Status: DC | PRN

## 2017-08-11 NOTE — Addendum Note (Signed)
Addended by: Mahala Menghini on: 08/11/2017 04:21 PM   Modules accepted: Orders

## 2017-08-11 NOTE — Telephone Encounter (Signed)
Called and cannot leave a vm at this time.

## 2017-08-11 NOTE — Telephone Encounter (Signed)
She has had a lot of blood work since I last saw her. We can discuss at office visit. Only one thing, AFP not done recently. She will be due for u/s liver 09/2017. We will discuss that at ov as well.   I have sent in lactulose to her pharmacy.

## 2017-08-12 NOTE — Telephone Encounter (Signed)
PT is aware of the message from [place]. Her cell number now is [phone]. Her home number is [phone]. She said we can remove the previous home number of [phone].

## 2017-08-12 NOTE — Telephone Encounter (Signed)
I called pt at [phone] and left vm for a return call.  ( the previous number [phone] person said it was his number). Confirm numbers to be on file when I speak with her.

## 2017-08-13 ENCOUNTER — Other Ambulatory Visit (HOSPITAL_COMMUNITY)
Admission: RE | Admit: 2017-08-13 | Discharge: 2017-08-13 | Disposition: A | Discharge status: Home / Self Care | Payer: Medicare Other | Source: Ambulatory Visit | Attending: Pulmonary Disease | Admitting: Pulmonary Disease

## 2017-08-13 ENCOUNTER — Encounter (HOSPITAL_COMMUNITY): Payer: Self-pay

## 2017-08-13 ENCOUNTER — Ambulatory Visit (HOSPITAL_COMMUNITY)
Admission: RE | Admit: 2017-08-13 | Discharge: 2017-08-13 | Disposition: A | Discharge status: Home / Self Care | Payer: Medicare Other | Source: Ambulatory Visit | Attending: Pulmonary Disease | Admitting: Pulmonary Disease

## 2017-08-13 DIAGNOSIS — F1120 Opioid dependence, uncomplicated: Secondary | ICD-10-CM | POA: Insufficient documentation

## 2017-08-13 DIAGNOSIS — Z1231 Encounter for screening mammogram for malignant neoplasm of breast: Secondary | ICD-10-CM | POA: Diagnosis not present

## 2017-08-13 LAB — CBC WITH DIFFERENTIAL/PLATELET
BASOS PCT: 0 %
Basophils Absolute: 0 10*3/uL (ref 0.0–0.1)
Eosinophils Absolute: 0.1 10*3/uL (ref 0.0–0.7)
Eosinophils Relative: 1 %
HCT: 47.2 % — ABNORMAL HIGH (ref 36.0–46.0)
HEMOGLOBIN: 15.8 g/dL — AB (ref 12.0–15.0)
Lymphocytes Relative: 24 %
Lymphs Abs: 1.9 10*3/uL (ref 0.7–4.0)
MCH: 31.2 pg (ref 26.0–34.0)
MCHC: 33.5 g/dL (ref 30.0–36.0)
MCV: 93.3 fL (ref 78.0–100.0)
Monocytes Absolute: 0.5 10*3/uL (ref 0.1–1.0)
Monocytes Relative: 7 %
NEUTROS PCT: 68 %
Neutro Abs: 5.2 10*3/uL (ref 1.7–7.7)
Platelets: 172 10*3/uL (ref 150–400)
RBC: 5.06 MIL/uL (ref 3.87–5.11)
RDW: 14.1 % (ref 11.5–15.5)
WBC: 7.8 10*3/uL (ref 4.0–10.5)

## 2017-08-13 LAB — COMPREHENSIVE METABOLIC PANEL
ALBUMIN: 4.4 g/dL (ref 3.5–5.0)
ALT: 37 U/L (ref 0–44)
ANION GAP: 11 (ref 5–15)
AST: 43 U/L — ABNORMAL HIGH (ref 15–41)
Alkaline Phosphatase: 116 U/L (ref 38–126)
BILIRUBIN TOTAL: 0.5 mg/dL (ref 0.3–1.2)
BUN: 17 mg/dL (ref 6–20)
CALCIUM: 10.2 mg/dL (ref 8.9–10.3)
CO2: 30 mmol/L (ref 22–32)
Chloride: 97 mmol/L — ABNORMAL LOW (ref 98–111)
Creatinine, Ser: 1.12 mg/dL — ABNORMAL HIGH (ref 0.44–1.00)
GFR calc non Af Amer: 58 mL/min — ABNORMAL LOW (ref >60)
Glucose, Bld: 114 mg/dL — ABNORMAL HIGH (ref 70–99)
POTASSIUM: 4.3 mmol/L (ref 3.5–5.1)
SODIUM: 138 mmol/L (ref 135–145)
TOTAL PROTEIN: 8.8 g/dL — AB (ref 6.5–8.1)

## 2017-08-13 LAB — LIPID PANEL
CHOL/HDL RATIO: 5.5 ratio
Cholesterol: 214 mg/dL — ABNORMAL HIGH (ref 0–200)
HDL: 39 mg/dL — AB (ref >40)
LDL Cholesterol: 105 mg/dL — ABNORMAL HIGH (ref 0–99)
TRIGLYCERIDES: 350 mg/dL — AB (ref <150)
VLDL: 70 mg/dL — ABNORMAL HIGH (ref 0–40)

## 2017-08-13 LAB — HEMOGLOBIN A1C
HEMOGLOBIN A1C: 7.6 % — AB (ref 4.8–5.6)
Mean Plasma Glucose: 171.42 mg/dL

## 2017-08-14 LAB — VITAMIN D 25 HYDROXY (VIT D DEFICIENCY, FRACTURES): VIT D 25 HYDROXY: 12.8 ng/mL — AB (ref 30.0–100.0)

## 2017-08-15 LAB — HEPATITIS C VRS RNA DETECT BY PCR-QUAL: HEPATITIS C VRS RNA BY PCR-QUAL: NEGATIVE

## 2017-08-27 ENCOUNTER — Other Ambulatory Visit: Payer: Self-pay

## 2017-08-27 NOTE — Patient Outreach (Signed)
[facility]) Care Management  08/27/2017  REGINE CHRISTIAN [birth year] [id number]   Medication Adherence call to Mrs. Eppie Gibson patient telephone number does not accept outside calls, patient is due on Invocana 150/1000. Mrs. Sheppard Coil is showing past due under [place].  [facility] Management Direct Dial [phone]  Fax [phone] [email or web address]

## 2017-08-30 ENCOUNTER — Telehealth: Payer: Self-pay | Admitting: Gastroenterology

## 2017-08-30 NOTE — Telephone Encounter (Signed)
Spoke with pt. Per 07/2017 phone note, pt is due for u/s 09/2017 and it is to be discussed at her apt per LSL.

## 2017-08-30 NOTE — Telephone Encounter (Signed)
Pt called to reschedule her OV from 8/27 to 8/12 due to her boyfriend is coming to town. She wanted to know if she needed any labs or liver u/s done before the OV on 8/12. Please advise and call her at [phone]

## 2017-08-30 NOTE — Telephone Encounter (Signed)
Noted  

## 2017-08-31 ENCOUNTER — Other Ambulatory Visit: Payer: Medicare Other | Admitting: Women's Health

## 2017-09-06 ENCOUNTER — Other Ambulatory Visit: Payer: Self-pay | Admitting: *Deleted

## 2017-09-06 ENCOUNTER — Ambulatory Visit (INDEPENDENT_AMBULATORY_CARE_PROVIDER_SITE_OTHER): Payer: Medicare Other | Admitting: Nurse Practitioner

## 2017-09-06 ENCOUNTER — Encounter: Payer: Self-pay | Admitting: *Deleted

## 2017-09-06 ENCOUNTER — Encounter: Payer: Self-pay | Admitting: Nurse Practitioner

## 2017-09-06 VITALS — BP 121/88 | HR 99 | Temp 97.5°F | Ht 65.0 in | Wt 211.4 lb

## 2017-09-06 DIAGNOSIS — R1319 Other dysphagia: Secondary | ICD-10-CM

## 2017-09-06 DIAGNOSIS — R1310 Dysphagia, unspecified: Secondary | ICD-10-CM

## 2017-09-06 DIAGNOSIS — K7460 Unspecified cirrhosis of liver: Secondary | ICD-10-CM | POA: Diagnosis not present

## 2017-09-06 NOTE — Assessment & Plan Note (Signed)
History of dysphasia with esophageal dilation.  She is having recurrent dysphagia since she had spinal surgery.  She is apparently recovered well from this at this point.  Her dysphagia continues, worse with breads and meats.  I will give her dysphasia 3 diet recommendations.  We will schedule an upper endoscopy with possible dilation.  Return for follow-up in 3 months.  Proceed with EGD +/- dilation on propofol/MAC with Dr. Oneida Alar in near future: the risks, benefits, and alternatives have been discussed with the patient in detail. The patient states understanding and desires to proceed.  History of opioid addiction.  She is currently on Suboxone.  No other anticoagulants, anxiolytics, chronic pain medications, or antidepressants.  We will plan for the procedure on propofol/MAC to promote adequate sedation.

## 2017-09-06 NOTE — Assessment & Plan Note (Signed)
Follow-up on [place] cirrhosis.  Generally asymptomatic from a GI and hepatic standpoint.  Her last meld score was 9 in March 2019.  She did not have her last ultrasound completed.  I will recheck and update her liver labs and her right upper quadrant ultrasound.  Return for follow-up in 6 months for routine liver care.

## 2017-09-06 NOTE — Patient Instructions (Signed)
1. Have your labs drawn when you are able to. 2. We will help schedule your ultrasound. 3. We will help schedule your upper endoscopy with possible dilation. 4. Return for follow-up in 3 months to discuss your swallowing after the procedure. 5. Return for follow-up in 6 months for routine liver care. 6. Call us if you have any questions or concerns  At [facility] we value your feedback. You may receive a survey about your visit today. Please share your experience as we strive to create trusting relationships with our patients to provide genuine, compassionate, quality care.  It was great to meet you today!  I hope you have a great summer!!

## 2017-09-06 NOTE — Progress Notes (Signed)
Referring Provider: Sinda Du, MD Primary Care Physician:  Sinda Du, MD Primary GI:  Dr. Oneida Alar  Chief Complaint  Patient presents with  . Cirrhosis  . Dysphagia    HPI:   Jamie Bell is a 47 y.o. female who presents for follow-up on cirrhosis.  Patient was last seen in our office 03/22/2017 for the same.  Cirrhosis is felt due to [place].  EGD and colonoscopy up-to-date 10/2015.  No varices noted, duodenitis/gastritis without H. pylori noted.  Small internal hemorrhoids.  2 polyps removed and congestion and edema the left colon most consistent with NSAID use.  At her last visit no GI complaints.  She was off oxycodone and on Suboxone.  Feeling much better, heartburn well controlled.  Admits forgetfulness related to her labs and ultrasound follow-up.  Recommended updated labs and ultrasound, follow-up in 6 months  Labs completed 03/27/2017 found CBC with mild leukocytosis 11.3, normal platelets at 162.  CMP with mildly elevated creatinine 1.08, normal AST/ALT/alkaline phosphatase/bilirubin.  INR normal at 1.15. MELD = 9; Child-Pugh A.  It does not appear right upper quadrant ultrasound was completed as ordered.  Today she states she didn't have her U/S done in February because she had spinal surgery in April. Denies abdominal pain, N/V, hematochezia, melena, fever, chills, unintentional weight loss. Denies yellowing of skin/eyes, darkened urine, acute episodic confusion, generalized pruritis, tremors/shakes. She is having dysphagia symptom since just after her surgery, with every meal, worse with breads and meats. Keeps liquids on hand. Chewing adequately. Denies chest pain, dyspnea, dizziness, lightheadedness, syncope, near syncope. Denies any other upper or lower GI symptoms.  Past Medical History:  Diagnosis Date  . Abscess    NECK ANTERIOR  . Chronic back pain   . Cirrhosis ([place])   . Constipation   . DDD (degenerative disc disease)   . Decreased hearing on the  right.   F/u by ENT in the past and no further management.  . Depression   . Diabetes mellitus   . Dyspnea    with exertion  . GERD (gastroesophageal reflux disease)   . Hypertension    took med. for HTN, several yrs. ago, no longer needs , seen by Dr. Lattie Haw last yr. during a hosp. at [facility], for MRSA bacteremia   . MRSA bacteremia 2014   Tx with Vancomycin  . NASH (nonalcoholic steatohepatitis)   . PCOS (polycystic ovarian syndrome)   . Pneumonia 2016  . Polysubstance abuse ([place])   . Sciatica    HNP- Cerv. 7- T2    Past Surgical History:  Procedure Laterality Date  . ANTERIOR CERVICAL DECOMP/DISCECTOMY FUSION N/A 05/28/2017   Procedure: ANTERIOR CERVICAL DECOMPRESSION CERVICAL THREE-FOUR;  Surgeon: Ashok Pall, MD;  Location: [place];  Service: Neurosurgery;  Laterality: N/A;  anterior  . BACK SURGERY    . BREAST EXCISIONAL BIOPSY Left    negative  . BREAST SURGERY Left    blocked milk ducts   . CARPAL TUNNEL RELEASE    . CATARACT EXTRACTION W/PHACO Left 02/04/2015   Procedure: CATARACT EXTRACTION PHACO AND INTRAOCULAR LENS PLACEMENT (IOC);  Surgeon: [name], MD;  Location: [facility];  Service: Ophthalmology;  Laterality: Left;  CDE: 4.11  . CHOLECYSTECTOMY    . COLONOSCOPY WITH PROPOFOL N/A 11/12/2015   Procedure: COLONOSCOPY WITH PROPOFOL;  Surgeon: [name], MD;  [facility];  Service: Endoscopy;  Laterality: N/A;  10:15 am  . ESOPHAGEAL BANDING N/A 11/12/2015   Procedure: ESOPHAGEAL BANDING;  Surgeon:  [name], MD;  [facility];  Service: Endoscopy;  Laterality: N/A;  . ESOPHAGOGASTRODUODENOSCOPY  2007   Dr. Oneida Alar: mild antral erythema. Future procedures need to be done with Propofol per notes.   . ESOPHAGOGASTRODUODENOSCOPY (EGD) WITH PROPOFOL N/A 11/12/2015   Procedure: ESOPHAGOGASTRODUODENOSCOPY (EGD) WITH PROPOFOL;  Surgeon: [name], MD;  [facility];  Service: Endoscopy;  Laterality: N/A;  . INCISION AND DRAINAGE  WOUND WITH TENDON REPAIR Right 07/03/2016   Procedure: INCISION AND DRAINAGE RIGHT LONG PROXIMAL INTERPHALANGEAL JOINT WOUND WITH  TENDON REPAIR AND CULTURES;  Surgeon: [name], MD;  Location: [place];  Service: Orthopedics;  Laterality: Right;  . NECK SURGERY    . PERIPHERALLY INSERTED CENTRAL CATHETER INSERTION Right 2007   for treatment with Vancomycin   . POLYPECTOMY  11/12/2015   Procedure: POLYPECTOMY;  Surgeon: [name], MD;  [facility];  Service: Endoscopy;;  transverse colon polyp  . POSTERIOR CERVICAL LAMINECTOMY N/A 11/22/2013   Procedure: POSTERIOR CERVICAL LAMINECTOMY C7/T1, T/1-2;  Surgeon: [name], MD;  Location: [place];  Service: Neurosurgery;  Laterality: N/A;  POSTERIOR CERVICAL LAMINECTOMY C7/T1, T/1-2  . TEE WITHOUT CARDIOVERSION N/A 08/09/2012   Procedure: TRANSESOPHAGEAL ECHOCARDIOGRAM (TEE);  Surgeon: [name], MD;  [facility];  Service: Cardiovascular;  Laterality: N/A;    Current Outpatient Medications  Medication Sig Dispense Refill  . Aspirin-Salicylamide-Caffeine (BC HEADACHE PO) Take 1-2 packets by mouth 2 (two) times daily as needed (pain/headache).     . B Complex-Biotin-FA (B-50 COMPLEX PO) Take 1 tablet by mouth daily.    . Biotin w/ Vitamins C & E (HAIR/SKIN/NAILS PO) Take 1 tablet by mouth daily.    . Buprenorphine HCl-Naloxone HCl 8-2 MG FILM Place 2 Film under the tongue daily.   0  . Canagliflozin-metFORMIN HCl (INVOKAMET) [phone] MG TABS Take 1 tablet by mouth 2 (two) times daily.    . fluticasone furoate-vilanterol (BREO ELLIPTA) 200-25 MCG/INH AEPB Inhale 1 puff into the lungs daily.    Marland Kitchen lactulose (CHRONULAC) 10 GM/15ML solution Take 30 mLs (20 g total) by mouth 2 (two) times daily as needed for mild constipation. 1800 mL 0  . pantoprazole (PROTONIX) 40 MG tablet Take 1 tablet (40 mg total) by mouth 2 (two) times daily. 60 tablet 12  . pravastatin (PRAVACHOL) 20 MG tablet Take 20 mg by mouth  daily.     Marland Kitchen tiotropium (SPIRIVA) 18 MCG inhalation capsule Place 18 mcg into inhaler and inhale daily as needed (shortness of breath).     No current facility-administered medications for this visit.     Allergies as of 09/06/2017 - Review Complete 09/06/2017  Allergen Reaction Noted  . Chantix [varenicline] Other (See Comments) 05/28/2017  . Ciprofloxacin Swelling 05/20/2005  . Nsaids Hives 05/02/2012  . Shrimp [shellfish allergy] Itching 05/19/2017  . Tylenol [acetaminophen] Other (See Comments) 03/27/2017    Family History  Problem Relation Age of Onset  . Hypertension Mother   . Hypertension Father   . Cirrhosis Father        Drank some alcohol  . Diabetes Brother   . Diabetes Other   . Cirrhosis Paternal Aunt        No alcohol  . Cirrhosis Paternal Aunt        No alcohol  . Colon cancer Neg Hx     Social History   Socioeconomic History  . Marital status: Divorced    Spouse name: Not on  file  . Number of children: Not on file  . Years of education: Not on file  . Highest education level: Not on file  Occupational History  . Not on file  Social Needs  . Financial resource strain: Not on file  . Food insecurity:    Worry: Not on file    Inability: Not on file  . Transportation needs:    Medical: Not on file    Non-medical: Not on file  Tobacco Use  . Smoking status: Current Every Day Smoker    Packs/day: 1.00    Years: 20.00    Pack years: 20.00    Types: Cigarettes  . Smokeless tobacco: Never Used  Substance and Sexual Activity  . Alcohol use: Not Currently    Comment: rare  . Drug use: Not Currently    Types: IV, Marijuana    Comment: prior history of cocaine use; denied 09/06/17  . Sexual activity: Not Currently    Birth control/protection: None  Lifestyle  . Physical activity:    Days per week: Not on file    Minutes per session: Not on file  . Stress: Not on file  Relationships  . Social connections:    Talks on phone: Not on file    Gets  together: Not on file    Attends religious service: Not on file    Active member of club or organization: Not on file    Attends meetings of clubs or organizations: Not on file    Relationship status: Not on file  Other Topics Concern  . Not on file  Social History Narrative  . Not on file    Review of Systems: Complete ROS negative except as per HPI.   Physical Exam: BP 121/88   Pulse 99   Temp (!) 97.5 F (36.4 C) (Oral)   Ht 5' 5"  (1.651 m)   Wt 211 lb 6.4 oz (95.9 kg)   BMI 35.18 kg/m  General:   Alert and oriented. Pleasant and cooperative. Well-nourished and well-developed.  Eyes:  Without icterus, sclera clear and conjunctiva pink.  Ears:  Normal auditory acuity. Cardiovascular:  S1, S2 present without murmurs appreciated. Extremities without clubbing or edema. Respiratory:  Clear to auscultation bilaterally. No wheezes, rales, or rhonchi. No distress.  Gastrointestinal:  +BS, soft, non-tender and non-distended. No HSM noted. No guarding or rebound. No masses appreciated.  Rectal:  Deferred  Musculoskalatal:  Symmetrical without gross deformities. Neurologic:  Alert and oriented x4;  grossly normal neurologically. Psych:  Alert and cooperative. Normal mood and affect. Heme/Lymph/Immune: No excessive bruising noted.    09/06/2017 2:54 PM   Disclaimer: This note was dictated with voice recognition software. Similar sounding words can inadvertently be transcribed and may not be corrected upon review.

## 2017-09-07 ENCOUNTER — Telehealth: Payer: Self-pay | Admitting: *Deleted

## 2017-09-07 NOTE — Progress Notes (Signed)
CC'D TO PCP °

## 2017-09-07 NOTE — Telephone Encounter (Signed)
Pre-op scheduled for 09/29/17 at 1:15pm. Letter mailed. Called patient home #, NA and no VM. Called mobile and received message it has been changed or has been d/c'd.

## 2017-09-10 ENCOUNTER — Telehealth: Payer: Self-pay | Admitting: Gastroenterology

## 2017-09-10 ENCOUNTER — Ambulatory Visit (HOSPITAL_COMMUNITY)
Admission: RE | Admit: 2017-09-10 | Discharge: 2017-09-10 | Disposition: A | Discharge status: Home / Self Care | Payer: Medicare Other | Source: Ambulatory Visit | Attending: Nurse Practitioner | Admitting: Nurse Practitioner

## 2017-09-10 DIAGNOSIS — K7460 Unspecified cirrhosis of liver: Secondary | ICD-10-CM

## 2017-09-10 DIAGNOSIS — Z1289 Encounter for screening for malignant neoplasm of other sites: Secondary | ICD-10-CM | POA: Diagnosis not present

## 2017-09-10 DIAGNOSIS — Z9049 Acquired absence of other specified parts of digestive tract: Secondary | ICD-10-CM | POA: Insufficient documentation

## 2017-09-10 NOTE — Telephone Encounter (Signed)
Called and message said wireless customer not available, to call later.

## 2017-09-10 NOTE — Telephone Encounter (Signed)
[phone] PLEASE CALL PATIENT, SHE HAS QUESTIONS ABOUT HER LABS AND HEP VACCINE

## 2017-09-13 NOTE — Telephone Encounter (Signed)
Pt wanted to know if she has labs ordered. I told her there was no on order and she can discuss with Randall Hiss at her next appt.

## 2017-09-14 NOTE — Telephone Encounter (Signed)
CORRECTION: Labs are ordered and pt is aware to go do those.

## 2017-09-14 NOTE — Progress Notes (Signed)
Pt is aware and will go to the lab soon.

## 2017-09-21 ENCOUNTER — Ambulatory Visit: Payer: Medicare Other | Admitting: Gastroenterology

## 2017-09-24 NOTE — Patient Instructions (Signed)
Jamie Bell  09/24/2017     @PREFPERIOPPHARMACY @   Your procedure is scheduled on  10/05/2017   Report to [facility] at  1300   P.M.  Call this number if you have problems the morning of surgery:  [phone]   Remember:  Do not eat or drink after midnight.  You may drink clear liquids until  (follow the instructions given to you) .  Clear liquids allowed are:                    Water, Juice (non-citric and without pulp), Carbonated beverages, Clear Tea, Black Coffee only, Plain Jell-O only, Gatorade and Plain Popsicles only    Take these medicines the morning of surgery with A SIP OF WATER Buprenorphine, buspirone, lexapro, gabapentin, hydroxyzine, protonix. Use your spiriva before you come.    Do not wear jewelry, make-up or nail polish.  Do not wear lotions, powders, or perfumes, or deodorant.  Do not shave 48 hours prior to surgery.  Men may shave face and neck.  Do not bring valuables to the hospital.  [facility] is not responsible for any belongings or valuables.  Contacts, dentures or bridgework may not be worn into surgery.  Leave your suitcase in the car.  After surgery it may be brought to your room.  For patients admitted to the hospital, discharge time will be determined by your treatment team.  Patients discharged the day of surgery will not be allowed to drive home.   Name and phone number of your driver:   family Special instructions:  Follow the diet instructions given to you by Dr Oneida Alar office.  Please read over the following fact sheets that you were given. Anesthesia Post-op Instructions and Care and Recovery After Surgery       Esophagogastroduodenoscopy Esophagogastroduodenoscopy (EGD) is a procedure to examine the lining of the esophagus, stomach, and first part of the small intestine (duodenum). This procedure is done to check for problems such as inflammation, bleeding, ulcers, or growths. During this procedure, a long,  flexible, lighted tube with a camera attached (endoscope) is inserted down the throat. Tell a health care provider about:  Any allergies you have.  All medicines you are taking, including vitamins, herbs, eye drops, creams, and over-the-counter medicines.  Any problems you or family members have had with anesthetic medicines.  Any blood disorders you have.  Any surgeries you have had.  Any medical conditions you have.  Whether you are pregnant or may be pregnant. What are the risks? Generally, this is a safe procedure. However, problems may occur, including:  Infection.  Bleeding.  A tear (perforation) in the esophagus, stomach, or duodenum.  Trouble breathing.  Excessive sweating.  Spasms of the larynx.  A slowed heartbeat.  Low blood pressure.  What happens before the procedure?  Follow instructions from your health care provider about eating or drinking restrictions.  Ask your health care provider about: ? Changing or stopping your regular medicines. This is especially important if you are taking diabetes medicines or blood thinners. ? Taking medicines such as aspirin and ibuprofen. These medicines can thin your blood. Do not take these medicines before your procedure if your health care provider instructs you not to.  Plan to have someone take you home after the procedure.  If you wear dentures, be ready to remove them before the procedure. What happens during the procedure?  To reduce  your risk of infection, your health care team will wash or sanitize their hands.  An IV tube will be put in a vein in your hand or arm. You will get medicines and fluids through this tube.  You will be given one or more of the following: ? A medicine to help you relax (sedative). ? A medicine to numb the area (local anesthetic). This medicine may be sprayed into your throat. It will make you feel more comfortable and keep you from gagging or coughing during the procedure. ? A  medicine for pain.  A mouth guard may be placed in your mouth to protect your teeth and to keep you from biting on the endoscope.  You will be asked to lie on your left side.  The endoscope will be lowered down your throat into your esophagus, stomach, and duodenum.  Air will be put into the endoscope. This will help your health care provider see better.  The lining of your esophagus, stomach, and duodenum will be examined.  Your health care provider may: ? Take a tissue sample so it can be looked at in a lab (biopsy). ? Remove growths. ? Remove objects (foreign bodies) that are stuck. ? Treat any bleeding with medicines or other devices that stop tissue from bleeding. ? Widen (dilate) or stretch narrowed areas of your esophagus and stomach.  The endoscope will be taken out. The procedure may vary among health care providers and hospitals. What happens after the procedure?  Your blood pressure, heart rate, breathing rate, and blood oxygen level will be monitored often until the medicines you were given have worn off.  Do not eat or drink anything until the numbing medicine has worn off and your gag reflex has returned. This information is not intended to replace advice given to you by your health care provider. Make sure you discuss any questions you have with your health care provider. Document Released: 05/15/2004 Document Revised: 06/20/2015 Document Reviewed: 12/06/2014 Elsevier Interactive Patient Education  2018 [employer]. Esophagogastroduodenoscopy, Care After Refer to this sheet in the next few weeks. These instructions provide you with information about caring for yourself after your procedure. Your health care provider may also give you more specific instructions. Your treatment has been planned according to current medical practices, but problems sometimes occur. Call your health care provider if you have any problems or questions after your procedure. What can I expect  after the procedure? After the procedure, it is common to have:  A sore throat.  Nausea.  Bloating.  Dizziness.  Fatigue.  Follow these instructions at home:  Do not eat or drink anything until the numbing medicine (local anesthetic) has worn off and your gag reflex has returned. You will know that the local anesthetic has worn off when you can swallow comfortably.  Do not drive for 24 hours if you received a medicine to help you relax (sedative).  If your health care provider took a tissue sample for testing during the procedure, make sure to get your test results. This is your responsibility. Ask your health care provider or the department performing the test when your results will be ready.  Keep all follow-up visits as told by your health care provider. This is important. Contact a health care provider if:  You cannot stop coughing.  You are not urinating.  You are urinating less than usual. Get help right away if:  You have trouble swallowing.  You cannot eat or drink.  You have throat  or chest pain that gets worse.  You are dizzy or light-headed.  You faint.  You have nausea or vomiting.  You have chills.  You have a fever.  You have severe abdominal pain.  You have black, tarry, or bloody stools. This information is not intended to replace advice given to you by your health care provider. Make sure you discuss any questions you have with your health care provider. Document Released: 12/30/2011 Document Revised: 06/20/2015 Document Reviewed: 12/06/2014 Elsevier Interactive Patient Education  2018 [employer].  Esophageal Dilatation Esophageal dilatation is a procedure to open a blocked or narrowed part of the esophagus. The esophagus is the long tube in your throat that carries food and liquid from your mouth to your stomach. The procedure is also called esophageal dilation. You may need this procedure if you have a buildup of scar tissue in your  esophagus that makes it difficult, painful, or even impossible to swallow. This can be caused by gastroesophageal reflux disease (GERD). In rare cases, people need this procedure because they have cancer of the esophagus or a problem with the way food moves through the esophagus. Sometimes you may need to have another dilatation to enlarge the opening of the esophagus gradually. Tell a health care provider about:  Any allergies you have.  All medicines you are taking, including vitamins, herbs, eye drops, creams, and over-the-counter medicines.  Any problems you or family members have had with anesthetic medicines.  Any blood disorders you have.  Any surgeries you have had.  Any medical conditions you have.  Any antibiotic medicines you are required to take before dental procedures. What are the risks? Generally, this is a safe procedure. However, problems can occur and include:  Bleeding from a tear in the lining of the esophagus.  A hole (perforation) in the esophagus.  What happens before the procedure?  Do not eat or drink anything after midnight on the night before the procedure or as directed by your health care provider.  Ask your health care provider about changing or stopping your regular medicines. This is especially important if you are taking diabetes medicines or blood thinners.  Plan to have someone take you home after the procedure. What happens during the procedure?  You will be given a medicine that makes you relaxed and sleepy (sedative).  A medicine may be sprayed or gargled to numb the back of the throat.  Your health care provider can use various instruments to do an esophageal dilatation. During the procedure, the instrument used will be placed in your mouth and passed down into your esophagus. Options include: ? Simple dilators. This instrument is carefully placed in the esophagus to stretch it. ? Guided wire bougies. In this method, a flexible tube  (endoscope) is used to insert a wire into the esophagus. The dilator is passed over this wire to enlarge the esophagus. Then the wire is removed. ? Balloon dilators. An endoscope with a small balloon at the end is passed down into the esophagus. Inflating the balloon gently stretches the esophagus and opens it up. What happens after the procedure?  Your blood pressure, heart rate, breathing rate, and blood oxygen level will be monitored often until the medicines you were given have worn off.  Your throat may feel slightly sore and will probably still feel numb. This will improve slowly over time.  You will not be allowed to eat or drink until the throat numbness has resolved.  If this is a same-day procedure,  you may be allowed to go home once you have been able to drink, urinate, and sit on the edge of the bed without nausea or dizziness.  If this is a same-day procedure, you should have a friend or family member with you for the next 24 hours after the procedure. This information is not intended to replace advice given to you by your health care provider. Make sure you discuss any questions you have with your health care provider. Document Released: 03/05/2005 Document Revised: 06/20/2015 Document Reviewed: 05/24/2013 Elsevier Interactive Patient Education  2018 [place] Anesthesia is a term that refers to techniques, procedures, and medicines that help a person stay safe and comfortable during a medical procedure. Monitored anesthesia care, or sedation, is one type of anesthesia. Your anesthesia specialist may recommend sedation if you will be having a procedure that does not require you to be unconscious, such as:  Cataract surgery.  A dental procedure.  A biopsy.  A colonoscopy.  During the procedure, you may receive a medicine to help you relax (sedative). There are three levels of sedation:  Mild sedation. At this level, you may feel awake and  relaxed. You will be able to follow directions.  Moderate sedation. At this level, you will be sleepy. You may not remember the procedure.  Deep sedation. At this level, you will be asleep. You will not remember the procedure.  The more medicine you are given, the deeper your level of sedation will be. Depending on how you respond to the procedure, the anesthesia specialist may change your level of sedation or the type of anesthesia to fit your needs. An anesthesia specialist will monitor you closely during the procedure. Let your health care provider know about:  Any allergies you have.  All medicines you are taking, including vitamins, herbs, eye drops, creams, and over-the-counter medicines.  Any use of steroids (by mouth or as a cream).  Any problems you or family members have had with sedatives and anesthetic medicines.  Any blood disorders you have.  Any surgeries you have had.  Any medical conditions you have, such as sleep apnea.  Whether you are pregnant or may be pregnant.  Any use of cigarettes, alcohol, or street drugs. What are the risks? Generally, this is a safe procedure. However, problems may occur, including:  Getting too much medicine (oversedation).  Nausea.  Allergic reaction to medicines.  Trouble breathing. If this happens, a breathing tube may be used to help with breathing. It will be removed when you are awake and breathing on your own.  Heart trouble.  Lung trouble.  Before the procedure Staying hydrated Follow instructions from your health care provider about hydration, which may include:  Up to 2 hours before the procedure - you may continue to drink clear liquids, such as water, clear fruit juice, black coffee, and plain tea.  Eating and drinking restrictions Follow instructions from your health care provider about eating and drinking, which may include:  8 hours before the procedure - stop eating heavy meals or foods such as meat, fried  foods, or fatty foods.  6 hours before the procedure - stop eating light meals or foods, such as toast or cereal.  6 hours before the procedure - stop drinking milk or drinks that contain milk.  2 hours before the procedure - stop drinking clear liquids.  Medicines Ask your health care provider about:  Changing or stopping your regular medicines. This is especially important if you  are taking diabetes medicines or blood thinners.  Taking medicines such as aspirin and ibuprofen. These medicines can thin your blood. Do not take these medicines before your procedure if your health care provider instructs you not to.  Tests and exams  You will have a physical exam.  You may have blood tests done to show: ? How well your kidneys and liver are working. ? How well your blood can clot.  General instructions  Plan to have someone take you home from the hospital or clinic.  If you will be going home right after the procedure, plan to have someone with you for 24 hours.  What happens during the procedure?  Your blood pressure, heart rate, breathing, level of pain and overall condition will be monitored.  An IV tube will be inserted into one of your veins.  Your anesthesia specialist will give you medicines as needed to keep you comfortable during the procedure. This may mean changing the level of sedation.  The procedure will be performed. After the procedure  Your blood pressure, heart rate, breathing rate, and blood oxygen level will be monitored until the medicines you were given have worn off.  Do not drive for 24 hours if you received a sedative.  You may: ? Feel sleepy, clumsy, or nauseous. ? Feel forgetful about what happened after the procedure. ? Have a sore throat if you had a breathing tube during the procedure. ? Vomit. This information is not intended to replace advice given to you by your health care provider. Make sure you discuss any questions you have with your  health care provider. Document Released: 10/08/2004 Document Revised: 06/21/2015 Document Reviewed: 05/05/2015 Elsevier Interactive Patient Education  2018 [place], Care After These instructions provide you with information about caring for yourself after your procedure. Your health care provider may also give you more specific instructions. Your treatment has been planned according to current medical practices, but problems sometimes occur. Call your health care provider if you have any problems or questions after your procedure. What can I expect after the procedure? After your procedure, it is common to:  Feel sleepy for several hours.  Feel clumsy and have poor balance for several hours.  Feel forgetful about what happened after the procedure.  Have poor judgment for several hours.  Feel nauseous or vomit.  Have a sore throat if you had a breathing tube during the procedure.  Follow these instructions at home: For at least 24 hours after the procedure:   Do not: ? Participate in activities in which you could fall or become injured. ? Drive. ? Use heavy machinery. ? Drink alcohol. ? Take sleeping pills or medicines that cause drowsiness. ? Make important decisions or sign legal documents. ? Take care of children on your own.  Rest. Eating and drinking  Follow the diet that is recommended by your health care provider.  If you vomit, drink water, juice, or soup when you can drink without vomiting.  Make sure you have little or no nausea before eating solid foods. General instructions  Have a responsible adult stay with you until you are awake and alert.  Take over-the-counter and prescription medicines only as told by your health care provider.  If you smoke, do not smoke without supervision.  Keep all follow-up visits as told by your health care provider. This is important. Contact a health care provider if:  You keep feeling  nauseous or you keep vomiting.  You feel  light-headed.  You develop a rash.  You have a fever. Get help right away if:  You have trouble breathing. This information is not intended to replace advice given to you by your health care provider. Make sure you discuss any questions you have with your health care provider. Document Released: 05/05/2015 Document Revised: 09/04/2015 Document Reviewed: 05/05/2015 Elsevier Interactive Patient Education  [employer].

## 2017-09-29 ENCOUNTER — Encounter (HOSPITAL_COMMUNITY)
Admission: RE | Admit: 2017-09-29 | Discharge: 2017-09-29 | Disposition: A | Discharge status: Home / Self Care | Payer: Medicare Other | Source: Ambulatory Visit | Attending: Gastroenterology | Admitting: Gastroenterology

## 2017-10-04 ENCOUNTER — Encounter (HOSPITAL_COMMUNITY): Payer: Self-pay

## 2017-10-04 ENCOUNTER — Telehealth: Payer: Self-pay | Admitting: *Deleted

## 2017-10-04 ENCOUNTER — Encounter (HOSPITAL_COMMUNITY): Payer: Self-pay | Admitting: Anesthesiology

## 2017-10-04 ENCOUNTER — Encounter (HOSPITAL_COMMUNITY): Admission: RE | Admit: 2017-10-04 | Payer: Medicare Other | Source: Ambulatory Visit

## 2017-10-04 NOTE — Telephone Encounter (Signed)
Patient called carolyn to cancel procedure for tomorrow. She had a family emergency.   Called pt LMOVM

## 2017-10-05 ENCOUNTER — Encounter (HOSPITAL_COMMUNITY): Admission: RE | Payer: Self-pay | Source: Ambulatory Visit

## 2017-10-05 ENCOUNTER — Ambulatory Visit (HOSPITAL_COMMUNITY): Admission: RE | Admit: 2017-10-05 | Payer: Medicare Other | Source: Ambulatory Visit | Admitting: Gastroenterology

## 2017-10-05 SURGERY — ESOPHAGOGASTRODUODENOSCOPY (EGD) WITH PROPOFOL
Anesthesia: Monitor Anesthesia Care

## 2017-10-05 NOTE — Telephone Encounter (Signed)
LM w/ family member for her to call back

## 2017-10-05 NOTE — Telephone Encounter (Signed)
FYI to EG

## 2017-10-05 NOTE — Telephone Encounter (Signed)
Spoke with patient. She reports she will call us to r/s when she gets back in town.

## 2017-10-06 NOTE — Telephone Encounter (Signed)
Noted  

## 2017-10-13 ENCOUNTER — Other Ambulatory Visit: Payer: Medicare Other | Admitting: Adult Health

## 2017-11-15 ENCOUNTER — Telehealth: Payer: Self-pay

## 2017-11-15 NOTE — Telephone Encounter (Signed)
Pt called for results on her [place] that was done on 09/10/2017. I reminded her I told her on 09/14/2017. She did not remember. She will try to go to the labs this week.  She wanted me to mail results of [place] to her at [place] and I have put in the mail.

## 2017-11-17 ENCOUNTER — Inpatient Hospital Stay: Payer: Self-pay | Admitting: Infectious Diseases

## 2017-11-18 DIAGNOSIS — K7460 Unspecified cirrhosis of liver: Secondary | ICD-10-CM | POA: Diagnosis not present

## 2017-11-18 DIAGNOSIS — R1310 Dysphagia, unspecified: Secondary | ICD-10-CM | POA: Diagnosis not present

## 2017-11-19 LAB — COMPREHENSIVE METABOLIC PANEL
AG RATIO: 1.2 (calc) (ref 1.0–2.5)
ALT: 25 U/L (ref 6–29)
AST: 24 U/L (ref 10–35)
Albumin: 4 g/dL (ref 3.6–5.1)
Alkaline phosphatase (APISO): 117 U/L — ABNORMAL HIGH (ref 33–115)
BUN: 16 mg/dL (ref 7–25)
CALCIUM: 9 mg/dL (ref 8.6–10.2)
CO2: 22 mmol/L (ref 20–32)
Chloride: 105 mmol/L (ref 98–110)
Creat: 0.93 mg/dL (ref 0.50–1.10)
GLOBULIN: 3.3 g/dL (ref 1.9–3.7)
GLUCOSE: 158 mg/dL — AB (ref 65–139)
Potassium: 4 mmol/L (ref 3.5–5.3)
SODIUM: 136 mmol/L (ref 135–146)
TOTAL PROTEIN: 7.3 g/dL (ref 6.1–8.1)
Total Bilirubin: 0.4 mg/dL (ref 0.2–1.2)

## 2017-11-19 LAB — CBC WITH DIFFERENTIAL/PLATELET
BASOS ABS: 46 {cells}/uL (ref 0–200)
BASOS PCT: 0.5 %
EOS PCT: 1.4 %
Eosinophils Absolute: 127 cells/uL (ref 15–500)
HCT: 45.7 % — ABNORMAL HIGH (ref 35.0–45.0)
HEMOGLOBIN: 15.4 g/dL (ref 11.7–15.5)
LYMPHS ABS: 1529 {cells}/uL (ref 850–3900)
MCH: 30.4 pg (ref 27.0–33.0)
MCHC: 33.7 g/dL (ref 32.0–36.0)
MCV: 90.3 fL (ref 80.0–100.0)
MONOS PCT: 7.1 %
MPV: 12.2 fL (ref 7.5–12.5)
NEUTROS PCT: 74.2 %
Neutro Abs: 6752 cells/uL (ref 1500–7800)
PLATELETS: 147 10*3/uL (ref 140–400)
RBC: 5.06 10*6/uL (ref 3.80–5.10)
RDW: 14.1 % (ref 11.0–15.0)
TOTAL LYMPHOCYTE: 16.8 %
WBC mixed population: 646 cells/uL (ref 200–950)
WBC: 9.1 10*3/uL (ref 3.8–10.8)

## 2017-11-19 LAB — PROTIME-INR
INR: 1.1
Prothrombin Time: 10.9 s (ref 9.0–11.5)

## 2017-11-19 LAB — AFP TUMOR MARKER: AFP TUMOR MARKER: 4.1 ng/mL

## 2017-11-25 ENCOUNTER — Encounter: Payer: Self-pay | Admitting: *Deleted

## 2017-11-25 ENCOUNTER — Other Ambulatory Visit: Payer: Medicare Other | Admitting: Adult Health

## 2017-12-06 NOTE — Progress Notes (Signed)
Called, many rings and no answer.

## 2017-12-07 NOTE — Progress Notes (Signed)
Mailed letter for pt to call.

## 2017-12-15 ENCOUNTER — Ambulatory Visit (INDEPENDENT_AMBULATORY_CARE_PROVIDER_SITE_OTHER): Payer: Medicare Other | Admitting: Nurse Practitioner

## 2017-12-15 ENCOUNTER — Encounter: Payer: Self-pay | Admitting: Nurse Practitioner

## 2017-12-15 ENCOUNTER — Encounter: Payer: Self-pay | Admitting: Gastroenterology

## 2017-12-15 VITALS — BP 140/84 | HR 112 | Temp 96.1°F | Ht 65.0 in | Wt 203.6 lb

## 2017-12-15 DIAGNOSIS — R1319 Other dysphagia: Secondary | ICD-10-CM

## 2017-12-15 DIAGNOSIS — K649 Unspecified hemorrhoids: Secondary | ICD-10-CM | POA: Insufficient documentation

## 2017-12-15 DIAGNOSIS — R1310 Dysphagia, unspecified: Secondary | ICD-10-CM | POA: Diagnosis not present

## 2017-12-15 DIAGNOSIS — K7460 Unspecified cirrhosis of liver: Secondary | ICD-10-CM | POA: Diagnosis not present

## 2017-12-15 MED ORDER — HYDROCORTISONE 2.5 % RE CREA: 1.0000 "application " | TOPICAL_CREAM | Freq: Two times a day (BID) | RECTAL | 1 refills | Status: DC

## 2017-12-15 NOTE — Assessment & Plan Note (Signed)
Admits symptomatic hemorrhoids.  Denies hematochezia.  States it causes itching and irritation, she can feel it down there.  Is not bad at this time but she does not want to get any worse.  Recommended avoiding straining, taking MiraLAX as needed for better bowel movements.  She states her bowel movements are doing well currently.  I will send in Anusol to her pharmacy as she feels Preparation H is not helping.  Follow-up in 3 months.

## 2017-12-15 NOTE — Assessment & Plan Note (Signed)
Dysphasia has resolved.  She canceled her EGD.  She does not want to schedule at this time.  Follow-up as needed.

## 2017-12-15 NOTE — Assessment & Plan Note (Signed)
Chronic cirrhosis care up-to-date.  She is asking for hepatitis B vaccine series.  I will write her prescription for this.  Follow-up in 3 months for routine liver care.

## 2017-12-15 NOTE — Patient Instructions (Signed)
1. I have written a prescription for the hepatitis A and hepatitis B vaccines. [place] [facility] may have this.  If not, I know that [facility] can order it and give it to you. 3. I sent in a prescription for Anusol to your pharmacy.  Take this twice a day, up to 10 days at a time, as needed for hemorrhoid symptoms. 4. Return for follow-up in 3 months. 5. Call us if you have any questions or concerns  At [facility] we value your feedback. You may receive a survey about your visit today. Please share your experience as we strive to create trusting relationships with our patients to provide genuine, compassionate, quality care.  We appreciate your understanding and patience as we review any laboratory studies, imaging, and other diagnostic tests that are ordered as we care for you. Our office policy is 5 business days for review of these results, and any emergent or urgent results are addressed in a timely manner for your best interest. If you do not hear from our office in 1 week, please contact us.   We also encourage the use of MyChart, which contains your medical information for your review as well. If you are not enrolled in this feature, an access code is on this after visit summary for your convenience. Thank you for allowing [place] to be involved in your care.  It was great to see you today!  I hope you have a Happy Thanksgiving!!

## 2017-12-15 NOTE — Progress Notes (Signed)
Referring Provider: Sinda Du, MD Primary Care Physician:  Sinda Du, MD Primary GI:  Dr. Oneida Alar  Chief Complaint  Patient presents with  . Dysphagia    better  . Hemorrhoids    bleeding    HPI:   Jamie Bell is a 47 y.o. female who presents for follow-up on dysphasia.  The patient was last seen in our office 09/06/2017 for cirrhosis and dysphasia.  Likely [place] cirrhosis.  Generally well compensated with a recent meld score of 9 and child Pugh class A.  Colonoscopy and endoscopy up-to-date 10/2015 with no varices noted, duodenitis/gastritis without H. pylori, small internal hemorrhoids, 2 polyps removed with noted congestion and edema of the left colon most consistent with NSAID use.  Heartburn well controlled at that time.  Ultrasound was not completed as recommended.  No hepatic symptoms.  Described solid food dysphagia with every meal, worse with breads and meats, keeps liquids on hand and notes that she does chew adequately.  No other GI symptoms.  Recommended updated labs and imaging, upper endoscopy with possible dilation, follow-up in 3 months related to dysphasia and follow-up in 6 months for routine liver care.  She never had her endoscopy.  She called our office on 10/04/2017 (the day of the procedure) to cancel due to a family emergency.  At that time she indicated she would call us to reschedule when she was back in town.  She did not call us back to reschedule.  Labs are completed 11/18/2017 which found essentially normal CBC, normal kidney function, CMP with elevated alkaline phosphatase minimally at 117, other LFTs normal.  INR normal.  aFP normal.  Meld score calculated at 7, child Pugh class A.  Right upper quadrant ultrasound completed 09/10/2017 which found status post cholecystectomy, consistent with cirrhosis, no concerning lesions of the liver.  Her cirrhosis care is up to date and no testing needed at this time, will be see in 02/2018 for routine  cirrhosis care.  Today she states she cancelled her EGD because she got nervous. Her dysphagia is resolved now.  Today she states she's doing ok overall. Thinks her swallowing problems were due to her neck surgery. States she's getting older and needs to start paying attention to her health; is asking for a Hepatitis B vaccine Rx, has had a couple before but lost them. Denies abdominal pain, N/V, hematochezia, melena, fever, chills, unintentional weight loss. Is having hemorrhoid flares with straining including irrigation and discomfort. Preparation H not effective, is asking for Rx. Denies chest pain, dyspnea, dizziness, lightheadedness, syncope, near syncope. Denies any other upper or lower GI symptoms.  Constipation has significantly improved after stopping pain medications. Again denies hematochezia. She is on Suboxone currently. States "it's amazing when you're not on drugs you can think a lot more clearly."  Past Medical History:  Diagnosis Date  . Abscess    NECK ANTERIOR  . Chronic back pain   . Cirrhosis ([place])   . Constipation   . DDD (degenerative disc disease)   . Decreased hearing on the right.   F/u by ENT in the past and no further management.  . Depression   . Diabetes mellitus   . Dyspnea    with exertion  . GERD (gastroesophageal reflux disease)   . Hypertension    took med. for HTN, several yrs. ago, no longer needs , seen by Dr. Lattie Haw last yr. during a hosp. at [facility], for MRSA bacteremia   . MRSA bacteremia 2014  Tx with Vancomycin  . NASH (nonalcoholic steatohepatitis)   . PCOS (polycystic ovarian syndrome)   . Pneumonia 2016  . Polysubstance abuse ([place])   . Sciatica    HNP- Cerv. 7- T2    Past Surgical History:  Procedure Laterality Date  . ANTERIOR CERVICAL DECOMP/DISCECTOMY FUSION N/A 05/28/2017   Procedure: ANTERIOR CERVICAL DECOMPRESSION CERVICAL THREE-FOUR;  Surgeon: Ashok Pall, MD;  Location: [place];  Service: Neurosurgery;  Laterality: N/A;   anterior  . BACK SURGERY    . BREAST EXCISIONAL BIOPSY Left    negative  . BREAST SURGERY Left    blocked milk ducts   . CARPAL TUNNEL RELEASE    . CATARACT EXTRACTION W/PHACO Left 02/04/2015   Procedure: CATARACT EXTRACTION PHACO AND INTRAOCULAR LENS PLACEMENT (IOC);  Surgeon: [name], MD;  Location: [facility];  Service: Ophthalmology;  Laterality: Left;  CDE: 4.11  . CHOLECYSTECTOMY    . COLONOSCOPY WITH PROPOFOL N/A 11/12/2015   Procedure: COLONOSCOPY WITH PROPOFOL;  Surgeon: [name], MD;  [facility];  Service: Endoscopy;  Laterality: N/A;  10:15 am  . ESOPHAGEAL BANDING N/A 11/12/2015   Procedure: ESOPHAGEAL BANDING;  Surgeon: [name], MD;  [facility];  Service: Endoscopy;  Laterality: N/A;  . ESOPHAGOGASTRODUODENOSCOPY  2007   Dr. Oneida Alar: mild antral erythema. Future procedures need to be done with Propofol per notes.   . ESOPHAGOGASTRODUODENOSCOPY (EGD) WITH PROPOFOL N/A 11/12/2015   Procedure: ESOPHAGOGASTRODUODENOSCOPY (EGD) WITH PROPOFOL;  Surgeon: [name], MD;  [facility];  Service: Endoscopy;  Laterality: N/A;  . INCISION AND DRAINAGE WOUND WITH TENDON REPAIR Right 07/03/2016   Procedure: INCISION AND DRAINAGE RIGHT LONG PROXIMAL INTERPHALANGEAL JOINT WOUND WITH  TENDON REPAIR AND CULTURES;  Surgeon: [name], MD;  Location: [place];  Service: Orthopedics;  Laterality: Right;  . NECK SURGERY    . PERIPHERALLY INSERTED CENTRAL CATHETER INSERTION Right 2007   for treatment with Vancomycin   . POLYPECTOMY  11/12/2015   Procedure: POLYPECTOMY;  Surgeon: [name], MD;  [facility];  Service: Endoscopy;;  transverse colon polyp  . POSTERIOR CERVICAL LAMINECTOMY N/A 11/22/2013   Procedure: POSTERIOR CERVICAL LAMINECTOMY C7/T1, T/1-2;  Surgeon: [name], MD;  Location: [place] [facility];  Service: Neurosurgery;  Laterality: N/A;  POSTERIOR CERVICAL LAMINECTOMY C7/T1, T/1-2  . TEE WITHOUT CARDIOVERSION N/A  08/09/2012   Procedure: TRANSESOPHAGEAL ECHOCARDIOGRAM (TEE);  Surgeon: [name], MD;  [facility];  Service: Cardiovascular;  Laterality: N/A;    Current Outpatient Medications  Medication Sig Dispense Refill  . Aspirin-Acetaminophen-Caffeine (EXCEDRIN PO) Take by mouth as needed.    . Aspirin-Salicylamide-Caffeine (BC HEADACHE PO) Take 1-2 packets by mouth 2 (two) times daily as needed (pain/headache).     . Biotin w/ Vitamins C & E (HAIR/SKIN/NAILS PO) Take 1 tablet by mouth daily.    . Buprenorphine HCl-Naloxone HCl 8-2 MG FILM Place 1 Film under the tongue 3 (three) times daily.   0  . busPIRone (BUSPAR) 10 MG tablet Take 20 mg by mouth 3 (three) times daily.    . Canagliflozin-metFORMIN HCl (INVOKAMET) [id number] MG TABS Take 1 tablet by mouth 2 (two) times daily.    . fluticasone furoate-vilanterol (BREO ELLIPTA) 200-25 MCG/INH AEPB Inhale 1 puff into the lungs daily as needed (respiratory issues.).     Marland Kitchen omeprazole (PRILOSEC) 20 MG capsule Take 20 mg by mouth daily.    . pantoprazole (PROTONIX) 40 MG tablet Take  1 tablet (40 mg total) by mouth 2 (two) times daily. (Patient taking differently: Take 40 mg by mouth as needed. ) 60 tablet 12  . pravastatin (PRAVACHOL) 20 MG tablet Take 20 mg by mouth daily.     Marland Kitchen tiotropium (SPIRIVA) 18 MCG inhalation capsule Place 18 mcg into inhaler and inhale daily.      No current facility-administered medications for this visit.     Allergies as of 12/15/2017 - Review Complete 12/15/2017  Allergen Reaction Noted  . Chantix [varenicline] Other (See Comments) 05/28/2017  . Ciprofloxacin Swelling 05/20/2005  . Nsaids Hives 05/02/2012  . Shrimp [shellfish allergy] Itching 05/19/2017  . Tylenol [acetaminophen] Other (See Comments) 03/27/2017    Family History  Problem Relation Age of Onset  . Hypertension Mother   . Hypertension Father   . Cirrhosis Father        Drank some alcohol  . Diabetes Brother   . Diabetes Other   .  Cirrhosis Paternal Aunt        No alcohol  . Cirrhosis Paternal Aunt        No alcohol  . Colon cancer Neg Hx     Social History   Socioeconomic History  . Marital status: Divorced    Spouse name: Not on file  . Number of children: Not on file  . Years of education: Not on file  . Highest education level: Not on file  Occupational History  . Not on file  Social Needs  . Financial resource strain: Not on file  . Food insecurity:    Worry: Not on file    Inability: Not on file  . Transportation needs:    Medical: Not on file    Non-medical: Not on file  Tobacco Use  . Smoking status: Current Every Day Smoker    Packs/day: 1.00    Years: 20.00    Pack years: 20.00    Types: Cigarettes  . Smokeless tobacco: Never Used  Substance and Sexual Activity  . Alcohol use: Not Currently    Comment: rare  . Drug use: Yes    Types: IV, Marijuana    Comment: prior history of cocaine use; None since 11/26/17.  Marland Kitchen Sexual activity: Not Currently    Birth control/protection: None  Lifestyle  . Physical activity:    Days per week: Not on file    Minutes per session: Not on file  . Stress: Not on file  Relationships  . Social connections:    Talks on phone: Not on file    Gets together: Not on file    Attends religious service: Not on file    Active member of club or organization: Not on file    Attends meetings of clubs or organizations: Not on file    Relationship status: Not on file  Other Topics Concern  . Not on file  Social History Narrative  . Not on file    Review of Systems: Complete ROS negative except as per HPI.   Physical Exam: BP 140/84   Pulse (!) 112   Temp (!) 96.1 F (35.6 C) (Oral)   Ht 5' 5"  (1.651 m)   Wt 203 lb 9.6 oz (92.4 kg)   BMI 33.88 kg/m  General:   Alert and oriented. Pleasant and cooperative. Well-nourished and well-developed.  Eyes:  Without icterus, sclera clear and conjunctiva pink.  Ears:  Normal auditory acuity. Cardiovascular:   S1, S2 present without murmurs appreciated. Extremities without clubbing or edema. Respiratory:  Clear to auscultation bilaterally. No wheezes, rales, or rhonchi. No distress.  Gastrointestinal:  +BS, soft, non-tender and non-distended. No HSM noted. No guarding or rebound. No masses appreciated.  Rectal:  Deferred  Musculoskalatal:  Symmetrical without gross deformities. Skin:  Intact without significant lesions or rashes. Neurologic:  Alert and oriented x4;  grossly normal neurologically. Psych:  Alert and cooperative. Normal mood and affect. Heme/Lymph/Immune: No excessive bruising noted.    12/15/2017 2:51 PM   Disclaimer: This note was dictated with voice recognition software. Similar sounding words can inadvertently be transcribed and may not be corrected upon review.

## 2017-12-16 NOTE — Progress Notes (Signed)
CC'D TO PCP °

## 2018-03-23 ENCOUNTER — Telehealth: Payer: Self-pay | Admitting: *Deleted

## 2018-03-23 ENCOUNTER — Encounter: Payer: Self-pay | Admitting: *Deleted

## 2018-03-23 ENCOUNTER — Encounter: Payer: Self-pay | Admitting: Gastroenterology

## 2018-03-23 ENCOUNTER — Ambulatory Visit (INDEPENDENT_AMBULATORY_CARE_PROVIDER_SITE_OTHER): Payer: Medicare Other | Admitting: Gastroenterology

## 2018-03-23 VITALS — BP 154/92 | HR 98 | Temp 99.0°F | Ht 65.0 in | Wt 201.4 lb

## 2018-03-23 DIAGNOSIS — K219 Gastro-esophageal reflux disease without esophagitis: Secondary | ICD-10-CM | POA: Diagnosis not present

## 2018-03-23 DIAGNOSIS — K7460 Unspecified cirrhosis of liver: Secondary | ICD-10-CM | POA: Diagnosis not present

## 2018-03-23 NOTE — Telephone Encounter (Signed)
Checked [employer]icore website and no PA is required for u/s

## 2018-03-23 NOTE — Progress Notes (Signed)
Primary Care Physician: Sinda Du, MD  Primary Gastroenterologist:  Barney Drain, MD   Chief Complaint  Patient presents with  . Follow-up    HPI: Jamie Bell is a 48 y.o. female here for follow-up of cirrhosis felt to be due to [place].  Last seen in November.  Disease has been generally well compensated.  Colonoscopy and upper endoscopy in October 2017, no varices, duodenitis/gastritis without H. pylori, biopsy showed reactive gastropathy likely due to NSAID/aspirin use.  Small internal hemorrhoids, 2 polyps removed (tubular adenomas) with noted congestion and edema in the left colon most consistent with NSAID use.  Next colonoscopy in 5 to 10 years.  Last year she was scheduled for an EGD to evaluate dysphagia but she never had it done.  She states her symptoms resolved and was felt to be related to recent neck surgery.  She is due ultrasound for hepatoma screening.  We will update her labs at the same time.  Overall she is been doing better.  No longer on oral opioids.  Weaning off of Suboxone, down from 21 strips per week to 7 strips per week.  She had vomiting the other day with a migraine headache which is not unusual for her.  Since then she has had some vague nausea however.  She is on pantoprazole twice daily for heartburn with good relief.  Unfortunately she continues to take Excedrin with aspirin approximately 2/day.  Denies any abdominal pain.  Bowel movements are regular.  No blood in the stool or melena.  She tells me that she takes her Invokana only 1-2 times per week and never checks her sugars.    Current Outpatient Medications  Medication Sig Dispense Refill  . ALPRAZolam (XANAX) 0.25 MG tablet Take 0.25 mg by mouth 3 (three) times daily as needed for anxiety.    . Aspirin-Acetaminophen-Caffeine (EXCEDRIN PO) Take by mouth as needed.    . Biotin w/ Vitamins C & E (HAIR/SKIN/NAILS PO) Take 1 tablet by mouth daily.    . Buprenorphine HCl-Naloxone HCl 8-2  MG FILM Place 1 Film under the tongue 3 (three) times daily.   0  . Canagliflozin-metFORMIN HCl (INVOKAMET) [id number] MG TABS Take 1 tablet by mouth 2 (two) times daily.    . fluticasone furoate-vilanterol (BREO ELLIPTA) 200-25 MCG/INH AEPB Inhale 1 puff into the lungs daily as needed (respiratory issues.).     Marland Kitchen pantoprazole (PROTONIX) 40 MG tablet Take 1 tablet (40 mg total) by mouth 2 (two) times daily. (Patient taking differently: Take 40 mg by mouth as needed. ) 60 tablet 12  . tiotropium (SPIRIVA) 18 MCG inhalation capsule Place 18 mcg into inhaler and inhale daily.      No current facility-administered medications for this visit.     Allergies as of 03/23/2018 - Review Complete 03/23/2018  Allergen Reaction Noted  . Chantix [varenicline] Other (See Comments) 05/28/2017  . Ciprofloxacin Swelling 05/20/2005  . Nsaids Hives 05/02/2012  . Shrimp [shellfish allergy] Itching 05/19/2017  . Tylenol [acetaminophen] Other (See Comments) 03/27/2017    ROS:  General: Negative for anorexia, weight loss, fever, chills, fatigue, weakness. ENT: Negative for hoarseness, difficulty swallowing , nasal congestion. CV: Negative for chest pain, angina, palpitations, dyspnea on exertion, peripheral edema.  Respiratory: Negative for dyspnea at rest, dyspnea on exertion, cough, sputum, wheezing.  GI: See history of present illness. GU:  Negative for dysuria, hematuria, urinary incontinence, urinary frequency, nocturnal urination.  Endo: Negative for unusual weight change.  Physical Examination:   BP (!) 154/92   Pulse 98   Temp 99 F (37.2 C) (Oral)   Ht 5' 5"  (1.651 m)   Wt 201 lb 6.4 oz (91.4 kg)   BMI 33.51 kg/m   General: Well-nourished, well-developed in no acute distress.  Eyes: No icterus. Mouth: Oropharyngeal mucosa moist and pink , no lesions erythema or exudate. Lungs: Clear to auscultation bilaterally.  Heart: Regular rate and rhythm, no murmurs rubs or gallops.  Abdomen: Bowel  sounds are normal, nontender, nondistended, no hepatosplenomegaly or masses, no abdominal bruits or hernia , no rebound or guarding.   Extremities: No lower extremity edema. No clubbing or deformities. Neuro: Alert and oriented x 4   Skin: Warm and dry, no jaundice.   Psych: Alert and cooperative, normal mood and affect.  Imaging Studies: No results found.

## 2018-03-23 NOTE — Patient Instructions (Signed)
Please have your ultrasound and labs done as scheduled.   Take pantoprazole twice daily before a meal.   Limit Excedrin use as it has aspirin in it. Aspirin in significant dosages can cause bleeding ulcers.   Return to the office in six months. At that time we would recommend upper endoscopy to evaluate for blood vessels around the esophagus and band them if needed. This procedure is done while you are asleep and can help prevent life threatening bleeding.

## 2018-03-24 ENCOUNTER — Encounter: Payer: Self-pay | Admitting: Gastroenterology

## 2018-03-24 NOTE — Assessment & Plan Note (Signed)
Continue cirrhosis care.  Due for labs and ultrasound.  She states she is concerned that her pancreas may be acting up because she had bilious emesis the other day.  Had a family member who had issues with her pancreas.  Patient tells me she does not check her sugars nor take her Invokana on a regular basis.  She denies any abdominal pain or symptoms of pancreatic insufficiency.  Continue to monitor for now.  Encouraged her to be compliant regarding her diabetes.  Return to the office in 6 months.

## 2018-03-24 NOTE — Assessment & Plan Note (Signed)
Recent vague nausea.  Encourage continue PPI twice daily.  Limit aspirin products.  Abdominal ultrasound and labs as ordered.

## 2018-03-25 NOTE — Progress Notes (Signed)
CC'D TO PCP °

## 2018-03-30 ENCOUNTER — Ambulatory Visit (HOSPITAL_COMMUNITY)
Admission: RE | Admit: 2018-03-30 | Discharge: 2018-03-30 | Disposition: A | Discharge status: Home / Self Care | Payer: Medicare Other | Source: Ambulatory Visit | Attending: Gastroenterology | Admitting: Gastroenterology

## 2018-03-30 DIAGNOSIS — K7460 Unspecified cirrhosis of liver: Secondary | ICD-10-CM | POA: Insufficient documentation

## 2018-03-30 DIAGNOSIS — K219 Gastro-esophageal reflux disease without esophagitis: Secondary | ICD-10-CM | POA: Insufficient documentation

## 2018-04-01 NOTE — Progress Notes (Signed)
LMOM to call.

## 2018-04-04 NOTE — Progress Notes (Signed)
Letter mailed to pt to call and to do labs.

## 2018-04-04 NOTE — Progress Notes (Signed)
Letter mailed to call for results ane reminder to do labs.

## 2018-04-18 ENCOUNTER — Telehealth: Payer: Self-pay | Admitting: Gastroenterology

## 2018-04-18 NOTE — Telephone Encounter (Signed)
Pt received a letter from DS. Please call her at [phone]

## 2018-04-18 NOTE — Telephone Encounter (Signed)
I called and left message with someone for pt to call.

## 2018-04-19 NOTE — Telephone Encounter (Signed)
Tried to call, message said that customer was unavailable and to try the call later.

## 2018-06-10 DIAGNOSIS — J449 Chronic obstructive pulmonary disease, unspecified: Secondary | ICD-10-CM | POA: Diagnosis not present

## 2018-06-10 DIAGNOSIS — R2240 Localized swelling, mass and lump, unspecified lower limb: Secondary | ICD-10-CM | POA: Diagnosis not present

## 2018-06-10 DIAGNOSIS — M5134 Other intervertebral disc degeneration, thoracic region: Secondary | ICD-10-CM | POA: Diagnosis not present

## 2018-06-23 ENCOUNTER — Telehealth: Payer: Self-pay | Admitting: Gastroenterology

## 2018-06-23 NOTE — Telephone Encounter (Signed)
[phone] PLEASE CALL PATIENT, SHE STATED SHE RECEIVED A LETTER AND SHE ALSO NEEDS HER LAB ORDER MAILED TO HER

## 2018-06-27 NOTE — Telephone Encounter (Signed)
Pt was calling for [place] results from March. I had mailed her a letter to call.  I gave her the results and mailed her lab orders to her and she said she will try to do those soon.

## 2018-06-27 NOTE — Telephone Encounter (Signed)
Thanks

## 2018-07-25 DIAGNOSIS — E785 Hyperlipidemia, unspecified: Secondary | ICD-10-CM | POA: Diagnosis not present

## 2018-07-25 DIAGNOSIS — E119 Type 2 diabetes mellitus without complications: Secondary | ICD-10-CM | POA: Diagnosis not present

## 2018-07-25 DIAGNOSIS — R825 Elevated urine levels of drugs, medicaments and biological substances: Secondary | ICD-10-CM | POA: Diagnosis not present

## 2018-08-02 DIAGNOSIS — R825 Elevated urine levels of drugs, medicaments and biological substances: Secondary | ICD-10-CM | POA: Diagnosis not present

## 2018-08-02 DIAGNOSIS — E119 Type 2 diabetes mellitus without complications: Secondary | ICD-10-CM | POA: Diagnosis not present

## 2018-08-02 DIAGNOSIS — E785 Hyperlipidemia, unspecified: Secondary | ICD-10-CM | POA: Diagnosis not present

## 2018-08-03 DIAGNOSIS — E119 Type 2 diabetes mellitus without complications: Secondary | ICD-10-CM | POA: Diagnosis not present

## 2018-08-03 DIAGNOSIS — E785 Hyperlipidemia, unspecified: Secondary | ICD-10-CM | POA: Diagnosis not present

## 2018-08-03 DIAGNOSIS — R825 Elevated urine levels of drugs, medicaments and biological substances: Secondary | ICD-10-CM | POA: Diagnosis not present

## 2018-08-08 DIAGNOSIS — E785 Hyperlipidemia, unspecified: Secondary | ICD-10-CM | POA: Diagnosis not present

## 2018-08-08 DIAGNOSIS — E119 Type 2 diabetes mellitus without complications: Secondary | ICD-10-CM | POA: Diagnosis not present

## 2018-08-08 DIAGNOSIS — R825 Elevated urine levels of drugs, medicaments and biological substances: Secondary | ICD-10-CM | POA: Diagnosis not present

## 2018-08-15 DIAGNOSIS — E785 Hyperlipidemia, unspecified: Secondary | ICD-10-CM | POA: Diagnosis not present

## 2018-08-15 DIAGNOSIS — E119 Type 2 diabetes mellitus without complications: Secondary | ICD-10-CM | POA: Diagnosis not present

## 2018-08-22 ENCOUNTER — Encounter: Payer: Self-pay | Admitting: Gastroenterology

## 2018-08-22 DIAGNOSIS — E119 Type 2 diabetes mellitus without complications: Secondary | ICD-10-CM | POA: Diagnosis not present

## 2018-08-22 DIAGNOSIS — E785 Hyperlipidemia, unspecified: Secondary | ICD-10-CM | POA: Diagnosis not present

## 2018-09-05 DIAGNOSIS — E785 Hyperlipidemia, unspecified: Secondary | ICD-10-CM | POA: Diagnosis not present

## 2018-09-05 DIAGNOSIS — E119 Type 2 diabetes mellitus without complications: Secondary | ICD-10-CM | POA: Diagnosis not present

## 2018-09-12 DIAGNOSIS — E785 Hyperlipidemia, unspecified: Secondary | ICD-10-CM | POA: Diagnosis not present

## 2018-09-12 DIAGNOSIS — E119 Type 2 diabetes mellitus without complications: Secondary | ICD-10-CM | POA: Diagnosis not present

## 2018-09-15 DIAGNOSIS — M545 Low back pain: Secondary | ICD-10-CM | POA: Diagnosis not present

## 2018-09-15 DIAGNOSIS — E1165 Type 2 diabetes mellitus with hyperglycemia: Secondary | ICD-10-CM | POA: Diagnosis not present

## 2018-09-15 DIAGNOSIS — I10 Essential (primary) hypertension: Secondary | ICD-10-CM | POA: Diagnosis not present

## 2018-09-26 DIAGNOSIS — E119 Type 2 diabetes mellitus without complications: Secondary | ICD-10-CM | POA: Diagnosis not present

## 2018-09-26 DIAGNOSIS — E785 Hyperlipidemia, unspecified: Secondary | ICD-10-CM | POA: Diagnosis not present

## 2018-09-28 ENCOUNTER — Other Ambulatory Visit: Payer: Self-pay

## 2018-09-28 NOTE — Patient Outreach (Signed)
[facility]) Care Management  09/28/2018  AVANI SENSABAUGH 08-08-1970 [id number]   Medication Adherence call to Mrs. Norah Devin spoke with patients mother she explain patient does not leave there and does not a phone where it can be contact. Mrs. Sheppard Coil is showing past due on Pravastatin 20 mg and Invokamet 50/1000 under [place].   [facility] Management Direct Dial [phone]  Fax [phone] [email or web address]

## 2018-10-12 DIAGNOSIS — E119 Type 2 diabetes mellitus without complications: Secondary | ICD-10-CM | POA: Diagnosis not present

## 2018-10-12 DIAGNOSIS — E785 Hyperlipidemia, unspecified: Secondary | ICD-10-CM | POA: Diagnosis not present

## 2018-10-19 DIAGNOSIS — E119 Type 2 diabetes mellitus without complications: Secondary | ICD-10-CM | POA: Diagnosis not present

## 2018-10-19 DIAGNOSIS — E785 Hyperlipidemia, unspecified: Secondary | ICD-10-CM | POA: Diagnosis not present

## 2018-10-26 DIAGNOSIS — E119 Type 2 diabetes mellitus without complications: Secondary | ICD-10-CM | POA: Diagnosis not present

## 2018-10-26 DIAGNOSIS — E785 Hyperlipidemia, unspecified: Secondary | ICD-10-CM | POA: Diagnosis not present

## 2018-10-31 DIAGNOSIS — M5137 Other intervertebral disc degeneration, lumbosacral region: Secondary | ICD-10-CM | POA: Diagnosis not present

## 2018-10-31 DIAGNOSIS — I10 Essential (primary) hypertension: Secondary | ICD-10-CM | POA: Diagnosis not present

## 2018-10-31 DIAGNOSIS — J45998 Other asthma: Secondary | ICD-10-CM | POA: Diagnosis not present

## 2018-10-31 DIAGNOSIS — E119 Type 2 diabetes mellitus without complications: Secondary | ICD-10-CM | POA: Diagnosis not present

## 2018-11-01 DIAGNOSIS — E785 Hyperlipidemia, unspecified: Secondary | ICD-10-CM | POA: Diagnosis not present

## 2018-11-01 DIAGNOSIS — E119 Type 2 diabetes mellitus without complications: Secondary | ICD-10-CM | POA: Diagnosis not present

## 2018-11-02 DIAGNOSIS — E785 Hyperlipidemia, unspecified: Secondary | ICD-10-CM | POA: Diagnosis not present

## 2018-11-02 DIAGNOSIS — E119 Type 2 diabetes mellitus without complications: Secondary | ICD-10-CM | POA: Diagnosis not present

## 2018-11-08 DIAGNOSIS — E119 Type 2 diabetes mellitus without complications: Secondary | ICD-10-CM | POA: Diagnosis not present

## 2018-11-08 DIAGNOSIS — E785 Hyperlipidemia, unspecified: Secondary | ICD-10-CM | POA: Diagnosis not present

## 2018-11-09 DIAGNOSIS — E559 Vitamin D deficiency, unspecified: Secondary | ICD-10-CM | POA: Diagnosis not present

## 2018-11-09 DIAGNOSIS — E785 Hyperlipidemia, unspecified: Secondary | ICD-10-CM | POA: Diagnosis not present

## 2018-11-09 DIAGNOSIS — E119 Type 2 diabetes mellitus without complications: Secondary | ICD-10-CM | POA: Diagnosis not present

## 2018-11-09 DIAGNOSIS — R5383 Other fatigue: Secondary | ICD-10-CM | POA: Diagnosis not present

## 2018-11-09 DIAGNOSIS — Z79899 Other long term (current) drug therapy: Secondary | ICD-10-CM | POA: Diagnosis not present

## 2018-11-16 DIAGNOSIS — E119 Type 2 diabetes mellitus without complications: Secondary | ICD-10-CM | POA: Diagnosis not present

## 2018-11-16 DIAGNOSIS — E785 Hyperlipidemia, unspecified: Secondary | ICD-10-CM | POA: Diagnosis not present

## 2018-11-24 DIAGNOSIS — E119 Type 2 diabetes mellitus without complications: Secondary | ICD-10-CM | POA: Diagnosis not present

## 2018-11-24 DIAGNOSIS — E785 Hyperlipidemia, unspecified: Secondary | ICD-10-CM | POA: Diagnosis not present

## 2018-11-25 DIAGNOSIS — E785 Hyperlipidemia, unspecified: Secondary | ICD-10-CM | POA: Diagnosis not present

## 2018-11-25 DIAGNOSIS — E119 Type 2 diabetes mellitus without complications: Secondary | ICD-10-CM | POA: Diagnosis not present

## 2018-11-30 DIAGNOSIS — E119 Type 2 diabetes mellitus without complications: Secondary | ICD-10-CM | POA: Diagnosis not present

## 2018-11-30 DIAGNOSIS — E785 Hyperlipidemia, unspecified: Secondary | ICD-10-CM | POA: Diagnosis not present

## 2018-12-02 DIAGNOSIS — E119 Type 2 diabetes mellitus without complications: Secondary | ICD-10-CM | POA: Diagnosis not present

## 2018-12-02 DIAGNOSIS — E785 Hyperlipidemia, unspecified: Secondary | ICD-10-CM | POA: Diagnosis not present

## 2018-12-17 ENCOUNTER — Other Ambulatory Visit: Payer: Self-pay

## 2018-12-17 ENCOUNTER — Emergency Department (HOSPITAL_COMMUNITY)
Admission: EM | Admit: 2018-12-17 | Discharge: 2018-12-17 | Disposition: A | Discharge status: Home / Self Care | Payer: Medicare Other | Attending: Emergency Medicine | Admitting: Emergency Medicine

## 2018-12-17 ENCOUNTER — Encounter (HOSPITAL_COMMUNITY): Payer: Self-pay | Admitting: *Deleted

## 2018-12-17 DIAGNOSIS — L02414 Cutaneous abscess of left upper limb: Secondary | ICD-10-CM | POA: Insufficient documentation

## 2018-12-17 DIAGNOSIS — I10 Essential (primary) hypertension: Secondary | ICD-10-CM | POA: Insufficient documentation

## 2018-12-17 DIAGNOSIS — Z79899 Other long term (current) drug therapy: Secondary | ICD-10-CM | POA: Insufficient documentation

## 2018-12-17 DIAGNOSIS — L0291 Cutaneous abscess, unspecified: Secondary | ICD-10-CM

## 2018-12-17 DIAGNOSIS — F17210 Nicotine dependence, cigarettes, uncomplicated: Secondary | ICD-10-CM | POA: Diagnosis not present

## 2018-12-17 DIAGNOSIS — L02213 Cutaneous abscess of chest wall: Secondary | ICD-10-CM | POA: Diagnosis not present

## 2018-12-17 DIAGNOSIS — E119 Type 2 diabetes mellitus without complications: Secondary | ICD-10-CM | POA: Diagnosis not present

## 2018-12-17 DIAGNOSIS — F1210 Cannabis abuse, uncomplicated: Secondary | ICD-10-CM | POA: Diagnosis not present

## 2018-12-17 MED ORDER — LIDOCAINE HCL (PF) 1 % IJ SOLN
5.0000 mL | Freq: Once | INTRAMUSCULAR | Status: AC
  Administered 2018-12-17: 5 mL via INTRADERMAL
  Filled 2018-12-17: qty 6

## 2018-12-17 MED ORDER — SULFAMETHOXAZOLE-TRIMETHOPRIM 800-160 MG PO TABS: 1.0000 | ORAL_TABLET | Freq: Two times a day (BID) | ORAL | 0 refills | Status: AC

## 2018-12-17 NOTE — Discharge Instructions (Signed)
Contact a health care provider if you have:  More redness, swelling, or pain around your abscess.  More fluid or blood coming from your abscess.  Warm skin around your abscess.  More pus or a bad smell coming from your abscess.  A fever.  Muscle aches.  Chills or a general ill feeling.  Get help right away if you:  Have severe pain.  See red streaks on your skin spreading away from the abscess.

## 2018-12-17 NOTE — ED Provider Notes (Signed)
[facility] EMERGENCY DEPARTMENT Provider Note   CSN: [id number] Arrival date & time: 12/17/18  1402     History   Chief Complaint Chief Complaint  Patient presents with  . Insect Bite    HPI Jamie Bell is a 48 y.o. female he has a past medical history of recurrent MRSA infections.  She has a past medical history of polysubstance abuse but has been sober for the past 2 years.  She noticed a pimple on her left wrist thought she might have a spider bite increasingly worse and become more painful with a central ulceration.   she noticed yesterday a small pimple on her left chest. She has been applying Bactroban ointment without significant improvement.  She is afraid she may have recurrent MRSA infection.  She denies fevers, chills, red streaking or severe pain.     HPI  Past Medical History:  Diagnosis Date  . Abscess    NECK ANTERIOR  . Chronic back pain   . Cirrhosis ([place])   . Constipation   . DDD (degenerative disc disease)   . Decreased hearing on the right.   F/u by ENT in the past and no further management.  . Depression   . Diabetes mellitus   . Dyspnea    with exertion  . GERD (gastroesophageal reflux disease)   . Hypertension    took med. for HTN, several yrs. ago, no longer needs , seen by Dr. Lattie Haw last yr. during a hosp. at [facility], for MRSA bacteremia   . MRSA bacteremia 2014   Tx with Vancomycin  . NASH (nonalcoholic steatohepatitis)   . PCOS (polycystic ovarian syndrome)   . Pneumonia 2016  . Polysubstance abuse ([place])   . Sciatica    HNP- Cerv. 7- T2    Patient Active Problem List   Diagnosis Date Noted  . GERD (gastroesophageal reflux disease) 03/23/2018  . Hemorrhoids 12/15/2017  . Dysphagia 09/06/2017  . IVDU (intravenous drug user) 05/30/2017  . Status post cervical spinal fusion 05/30/2017  . Abscess in epidural space of cervical spine 05/29/2017  . Cirrhosis ([place]) 09/29/2015  . Type 2 diabetes mellitus with diabetic chronic kidney  disease ([place])   . Anxiety state   . Depression   . Chronic back pain   . Spinal stenosis in cervical region 11/22/2013  . Opiate dependence ([place]) 06/04/2011  . TOBACCO USER 11/03/2008  . HYPERTENSION, BENIGN 06/10/2006  . DM (diabetes mellitus), type 2 ([place]) 04/06/2006  . POLYCYSTIC OVARIAN DISEASE 04/06/2006    Past Surgical History:  Procedure Laterality Date  . ANTERIOR CERVICAL DECOMP/DISCECTOMY FUSION N/A 05/28/2017   Procedure: ANTERIOR CERVICAL DECOMPRESSION CERVICAL THREE-FOUR;  Surgeon: Ashok Pall, MD;  Location: [place];  Service: Neurosurgery;  Laterality: N/A;  anterior  . BACK SURGERY    . BREAST EXCISIONAL BIOPSY Left    negative  . BREAST SURGERY Left    blocked milk ducts   . CARPAL TUNNEL RELEASE    . CATARACT EXTRACTION W/PHACO Left 02/04/2015   Procedure: CATARACT EXTRACTION PHACO AND INTRAOCULAR LENS PLACEMENT (IOC);  Surgeon: [name], MD;  Location: [facility];  Service: Ophthalmology;  Laterality: Left;  CDE: 4.11  . CHOLECYSTECTOMY    . COLONOSCOPY WITH PROPOFOL N/A 11/12/2015   Dr. fields:Small internal hemorrhoids, 2 polyps removed (tubular adenomas) with noted congestion and edema in the left colon most consistent with NSAID use.  Next colonoscopy in 5 to 10 years.  . ESOPHAGEAL BANDING N/A 11/12/2015   Procedure: ESOPHAGEAL BANDING;  Surgeon: [name], MD;  [facility];  Service: Endoscopy;  Laterality: N/A;  . ESOPHAGOGASTRODUODENOSCOPY  2007   Dr. Oneida Alar: mild antral erythema. Future procedures need to be done with Propofol per notes.   . ESOPHAGOGASTRODUODENOSCOPY (EGD) WITH PROPOFOL N/A 11/12/2015   Dr. Oneida Alar: Normal esophagus with no varices.  Gastritis and duodenitis, biopsy without H. pylori, most consistent with NSAID/aspirin use.  . INCISION AND DRAINAGE WOUND WITH TENDON REPAIR Right 07/03/2016   Procedure: INCISION AND DRAINAGE RIGHT LONG PROXIMAL INTERPHALANGEAL JOINT WOUND WITH  TENDON REPAIR AND CULTURES;  Surgeon: [name], MD;  Location: [place];  Service: Orthopedics;  Laterality: Right;  . NECK SURGERY    . PERIPHERALLY INSERTED CENTRAL CATHETER INSERTION Right 2007   for treatment with Vancomycin   . POLYPECTOMY  11/12/2015   Procedure: POLYPECTOMY;  Surgeon: [name], MD;  [facility];  Service: Endoscopy;;  transverse colon polyp  . POSTERIOR CERVICAL LAMINECTOMY N/A 11/22/2013   Procedure: POSTERIOR CERVICAL LAMINECTOMY C7/T1, T/1-2;  Surgeon: [name], MD;  Location: [place] [facility];  Service: Neurosurgery;  Laterality: N/A;  POSTERIOR CERVICAL LAMINECTOMY C7/T1, T/1-2  . TEE WITHOUT CARDIOVERSION N/A 08/09/2012   Procedure: TRANSESOPHAGEAL ECHOCARDIOGRAM (TEE);  Surgeon: [name], MD;  [facility];  Service: Cardiovascular;  Laterality: N/A;     OB History    Gravida      Para      Term      Preterm      AB      Living  0     SAB      TAB      Ectopic      Multiple      Live Births               Home Medications    Prior to Admission medications   Medication Sig Start Date End Date Taking? Authorizing Provider  ALPRAZolam (XANAX) 0.25 MG tablet Take 0.25 mg by mouth 3 (three) times daily as needed for anxiety.    [provider]  Aspirin-Acetaminophen-Caffeine (EXCEDRIN PO) Take by mouth as needed.    [provider]  Biotin w/ Vitamins C & E (HAIR/SKIN/NAILS PO) Take 1 tablet by mouth daily.    [provider]  Buprenorphine HCl-Naloxone HCl 8-2 MG FILM Place 1 Film under the tongue 3 (three) times daily.  05/18/17   [provider]  Canagliflozin-metFORMIN HCl (INVOKAMET) [phone] MG TABS Take 1 tablet by mouth 2 (two) times daily.    [provider]  fluticasone furoate-vilanterol (BREO ELLIPTA) 200-25 MCG/INH AEPB Inhale 1 puff into the lungs daily as needed (respiratory issues.).     [provider]  pantoprazole (PROTONIX) 40 MG tablet Take 1 tablet (40 mg total) by mouth 2  (two) times daily. Patient taking differently: Take 40 mg by mouth as needed.  10/02/15   Sinda Du, MD  tiotropium (SPIRIVA) 18 MCG inhalation capsule Place 18 mcg into inhaler and inhale daily.     [provider]    Family History Family History  Problem Relation Age of Onset  . Hypertension Mother   . Hypertension Father   . Cirrhosis Father        Drank some alcohol  . Diabetes Brother   . Diabetes Other   . Cirrhosis Paternal Aunt        No alcohol  . Cirrhosis Paternal Aunt  No alcohol  . Colon cancer Neg Hx     Social History Social History   Tobacco Use  . Smoking status: Current Every Day Smoker    Packs/day: 1.00    Years: 20.00    Pack years: 20.00    Types: Cigarettes  . Smokeless tobacco: Never Used  Substance Use Topics  . Alcohol use: Not Currently    Comment: rare  . Drug use: Yes    Types: IV, Marijuana    Comment: prior history of cocaine use; None since 11/26/17.     Allergies   Chantix [varenicline], Ciprofloxacin, Nsaids, Shrimp [shellfish allergy], and Tylenol [acetaminophen]   Review of Systems Review of Systems  Ten systems reviewed and are negative for acute change, except as noted in the HPI.   Physical Exam Updated Vital Signs BP 138/83 (BP Location: Right Arm)   Pulse (!) 101   Temp 98 F (36.7 C) (Oral)   Resp 18   Ht 5' 5.5" (1.664 m)   Wt 90.7 kg   SpO2 100%   BMI 32.78 kg/m   Physical Exam Vitals signs and nursing note reviewed.  Constitutional:      General: She is not in acute distress.    Appearance: She is well-developed. She is not diaphoretic.  HENT:     Head: Normocephalic and atraumatic.  Eyes:     General: No scleral icterus.    Conjunctiva/sclera: Conjunctivae normal.  Neck:     Musculoskeletal: Normal range of motion.  Cardiovascular:     Rate and Rhythm: Normal rate and regular rhythm.     Heart sounds: Normal heart sounds. No murmur. No friction rub. No gallop.   Pulmonary:      Effort: Pulmonary effort is normal. No respiratory distress.     Breath sounds: Normal breath sounds.  Abdominal:     General: Bowel sounds are normal. There is no distension.     Palpations: Abdomen is soft. There is no mass.     Tenderness: There is no abdominal tenderness. There is no guarding.  Skin:    General: Skin is warm and dry.     Comments: Small erythematous lesion on the left chest wall with central fluctuance and desquamation. There is a 2 x 2 centimeter erythematous raised area on the left wrist with central ulceration.  No streaking or lymphangitis noted.  Neurological:     Mental Status: She is alert and oriented to person, place, and time.  Psychiatric:        Behavior: Behavior normal.      ED Treatments / Results  Labs (all labs ordered are listed, but only abnormal results are displayed) Labs Reviewed  AEROBIC CULTURE (SUPERFICIAL SPECIMEN)    EKG None  Radiology No results found.  Procedures .Marland KitchenIncision and Drainage  Date/Time: 12/17/2018 3:29 PM Performed by: Margarita Mail, PA-C Authorized by: Margarita Mail, PA-C   Consent:    Consent obtained:  Verbal   Consent given by:  Patient   Risks discussed:  Bleeding, pain, incomplete drainage and infection   Alternatives discussed:  No treatment Location:    Type:  Abscess   Size:  2cm   Location:  Upper extremity   Upper extremity location:  Wrist   Wrist location:  L wrist Pre-procedure details:    Skin preparation:  Chloraprep Procedure type:    Complexity:  Simple Procedure details:    Incision types:  Stab incision   Incision depth:  Dermal   Scalpel blade:  15  Wound management:  Probed and deloculated and irrigated with saline   Drainage:  Purulent   Drainage amount:  Moderate   Wound treatment:  Wound left open   Packing materials:  None Post-procedure details:    Patient tolerance of procedure:  Tolerated well, no immediate complications   (including critical care  time)  Medications Ordered in ED Medications  lidocaine (PF) (XYLOCAINE) 1 % injection 5 mL (5 mLs Intradermal Given 12/17/18 1515)     Initial Impression / Assessment and Plan / ED Course  I have reviewed the triage vital signs and the nursing notes.  Pertinent labs & imaging results that were available during my care of the patient were reviewed by me and considered in my medical decision making (see chart for details).       Patient with history of MRSA and MRSA bacteremia.  Presents here with abscess of the left wrist and left chest wall.  The one on the chest wall is too small for incision and drainage.  She may continue to apply warm compresses and Bactroban.  We will treat with Bactrim DS. Patient has no evidence of cellulitis.  She is hemodynamically stable without fever.  Will discharge with close outpatient follow-up.  Discussed return precautions and home self-care.  Final Clinical Impressions(s) / ED Diagnoses   Final diagnoses:  Abscess of multiple sites    ED Discharge Orders    None       Margarita Mail, PA-C 12/17/18 1532    Dorie Rank, MD 12/18/18 [id number]

## 2018-12-17 NOTE — ED Notes (Signed)
Small bump/reddened area on left lower back. Pt has dressing over the area on her left wrist at this time and wishes to leave it on until the provider comes in.

## 2018-12-17 NOTE — ED Triage Notes (Signed)
Pt reports she has 2 pimple like areas come up , one on her left wrist and one on her left lower back. They first appeared about one week ago. Pt states she initially thought it was spider bites but she has a history of mrsa and became concerned it was that instead. Area are painful.

## 2018-12-20 LAB — AEROBIC CULTURE W GRAM STAIN (SUPERFICIAL SPECIMEN)
Culture: NO GROWTH
Gram Stain: NONE SEEN
Special Requests: NORMAL

## 2019-01-10 ENCOUNTER — Telehealth: Payer: Self-pay | Admitting: Gastroenterology

## 2019-01-10 DIAGNOSIS — K7460 Unspecified cirrhosis of liver: Secondary | ICD-10-CM

## 2019-01-10 NOTE — Telephone Encounter (Signed)
noted 

## 2019-01-10 NOTE — Telephone Encounter (Signed)
Per last [place] 03/2018:  Mahala Menghini, PA-C  03/31/2018 9:54 AM EST    No liver tumors. Stable cirrhosis.  NIC for ruq u/s in six months.  Remind patient to have labs done.  ---  Called patient and advised she was overdue for [place]. She stated to go ahead and get this scheduled for her. Called CS [place] scheduled for 01/13/2019 at 7:30am, arrival 7:15am, npo midnight. Called patient and she is aware of appt details.

## 2019-01-10 NOTE — Telephone Encounter (Signed)
PATIENT CALLED TO LET YOU KNOW THAT SHE RESCHEDULED HER ULTRASOUND

## 2019-01-10 NOTE — Telephone Encounter (Signed)
Pt called to make her 6 month follow up that was due in August and is aware on her appointment date and time. She was asking if she was due any labs or U/S of her liver prior to her appointment. Please advise. [phone]

## 2019-01-13 ENCOUNTER — Ambulatory Visit (HOSPITAL_COMMUNITY): Payer: Medicare Other

## 2019-01-23 ENCOUNTER — Other Ambulatory Visit: Payer: Self-pay | Admitting: Gastroenterology

## 2019-01-30 ENCOUNTER — Other Ambulatory Visit: Payer: Self-pay

## 2019-01-30 ENCOUNTER — Ambulatory Visit (HOSPITAL_COMMUNITY)
Admission: RE | Admit: 2019-01-30 | Discharge: 2019-01-30 | Disposition: A | Discharge status: Home / Self Care | Payer: Medicare Other | Source: Ambulatory Visit | Attending: Gastroenterology | Admitting: Gastroenterology

## 2019-01-30 DIAGNOSIS — K7460 Unspecified cirrhosis of liver: Secondary | ICD-10-CM | POA: Diagnosis not present

## 2019-02-06 ENCOUNTER — Other Ambulatory Visit: Payer: Self-pay

## 2019-02-06 ENCOUNTER — Ambulatory Visit (INDEPENDENT_AMBULATORY_CARE_PROVIDER_SITE_OTHER): Payer: Medicare Other | Admitting: Gastroenterology

## 2019-02-06 ENCOUNTER — Encounter: Payer: Self-pay | Admitting: Gastroenterology

## 2019-02-06 VITALS — BP 130/88 | HR 89 | Temp 96.8°F | Ht 65.0 in | Wt 208.4 lb

## 2019-02-06 DIAGNOSIS — K7460 Unspecified cirrhosis of liver: Secondary | ICD-10-CM

## 2019-02-06 DIAGNOSIS — K219 Gastro-esophageal reflux disease without esophagitis: Secondary | ICD-10-CM | POA: Diagnosis not present

## 2019-02-06 NOTE — Progress Notes (Signed)
Primary Care Physician: Sinda Du, MD  Primary Gastroenterologist:  Barney Drain, MD  Chief Complaint  Patient presents with  . Cirrhosis    HPI: Jamie Bell is a 49 y.o. female here for follow-up of cirrhosis felt to be due to [place], GERD.  Last seen in February.  Disease has generally been well compensated.Colonoscopy and upper endoscopy in October 2017, no varices, duodenitis/gastritis without H. pylori, biopsy showed reactive gastropathy likely due to NSAID/aspirin use.  Small internal hemorrhoids, 2 polyps removed (tubular adenomas) with noted congestion and edema in the left colon most consistent with NSAID use.  Next colonoscopy in 5 to 10 years.   Right upper quadrant ultrasound last week with findings of cirrhosis.  No focal liver lesions.  Still needs labs.  Clinically doing well.  Denies any abdominal pain.  She has been clean for 2 years.  She is worried about upcoming dental surgery, fearful that she will be given opioids for pain management.  Heartburn well controlled.  Bowel movements regular.  No melena or rectal bleeding.  No confusion, pruritus, fatigue.  Current Outpatient Medications  Medication Sig Dispense Refill  . Aspirin-Acetaminophen-Caffeine (EXCEDRIN PO) Take by mouth as needed.    . Biotin w/ Vitamins C & E (HAIR/SKIN/NAILS PO) Take 1 tablet by mouth daily.    . Buprenorphine HCl-Naloxone HCl 8-2 MG FILM Place 1 Film under the tongue 3 (three) times daily.   0  . furosemide (LASIX) 40 MG tablet Take 40 mg by mouth daily.    Marland Kitchen lactulose (CHRONULAC) 10 GM/15ML solution TAKE 30 MLS BY MOUTH 2 TIMES A DAY AS NEEDED FOR MILD CONSTIPATION. 1892 mL 3  . pantoprazole (PROTONIX) 40 MG tablet Take 1 tablet (40 mg total) by mouth 2 (two) times daily. 60 tablet 12  . pravastatin (PRAVACHOL) 40 MG tablet Take 40 mg by mouth daily.    Marland Kitchen SYNJARDY XR 12.05-998 MG TB24 Take 1 tablet by mouth 2 (two) times daily.    Marland Kitchen tiotropium (SPIRIVA) 18 MCG inhalation  capsule Place 18 mcg into inhaler and inhale daily.      No current facility-administered medications for this visit.    Allergies as of 02/06/2019 - Review Complete 02/06/2019  Allergen Reaction Noted  . Chantix [varenicline] Other (See Comments) 05/28/2017  . Ciprofloxacin Swelling 05/20/2005  . Nsaids Hives 05/02/2012  . Shrimp [shellfish allergy] Itching 05/19/2017  . Tylenol [acetaminophen] Other (See Comments) 03/27/2017    ROS:  General: Negative for anorexia, weight loss, fever, chills, fatigue, weakness. ENT: Negative for hoarseness, difficulty swallowing , nasal congestion. CV: Negative for chest pain, angina, palpitations, dyspnea on exertion, peripheral edema.  Respiratory: Negative for dyspnea at rest, dyspnea on exertion, cough, sputum, wheezing.  GI: See history of present illness. GU:  Negative for dysuria, hematuria, urinary incontinence, urinary frequency, nocturnal urination.  Endo: Negative for unusual weight change.    Physical Examination:   BP 130/88   Pulse 89   Temp (!) 96.8 F (36 C) (Temporal)   Ht 5' 5"  (1.651 m)   Wt 208 lb 6.4 oz (94.5 kg)   BMI 34.68 kg/m   General: Well-nourished, well-developed in no acute distress.  Eyes: No icterus. Mouth:masked Abdomen: Bowel sounds are normal, nontender, nondistended, no hepatosplenomegaly or masses, no abdominal bruits or hernia , no rebound or guarding.   Extremities: No lower extremity edema. No clubbing or deformities. Neuro: Alert and oriented x 4   Skin: Warm and dry, no jaundice.  Psych: Alert and cooperative, normal mood and affect.   Imaging Studies: US ABDOMEN LIMITED RUQ  Result Date: 01/30/2019 CLINICAL DATA:  Hepatic cirrhosis EXAM: ULTRASOUND ABDOMEN LIMITED RIGHT UPPER QUADRANT COMPARISON:  Abdominal ultrasound March 30, 2018 FINDINGS: Gallbladder: Surgically absent. Common bile duct: Diameter: 9 mm, within normal limits for post cholecystectomy state. No appreciable intrahepatic or  extrahepatic biliary duct dilatation. Liver: No focal lesion identified. Liver contour is nodular with diffuse increased liver echogenicity. Liver echogenicity is mildly coarsened. Portal vein is patent on color Doppler imaging with normal direction of blood flow towards the liver. Other: None. IMPRESSION: Appearance of the liver is indicative of hepatic cirrhosis. While no focal liver lesions are evident on this study, it must be cautioned that the sensitivity of ultrasound for detection of focal liver lesions is diminished in this circumstance. Gallbladder absent. No biliary duct dilatation given post cholecystectomy state. Electronically Signed   By: Lowella Grip III M.D.   On: 01/30/2019 12:29

## 2019-02-06 NOTE — Patient Instructions (Signed)
1. Please have your labs done as your convenience.  2. We will see you back in 08/2019. Call with any questions or concerns.  3. Congratulations on all the positive changes you have made!!!!

## 2019-02-07 LAB — COMPREHENSIVE METABOLIC PANEL
AG Ratio: 1 (calc) (ref 1.0–2.5)
ALT: 26 U/L (ref 6–29)
AST: 26 U/L (ref 10–35)
Albumin: 3.9 g/dL (ref 3.6–5.1)
Alkaline phosphatase (APISO): 129 U/L — ABNORMAL HIGH (ref 31–125)
BUN/Creatinine Ratio: 15 (calc) (ref 6–22)
BUN: 18 mg/dL (ref 7–25)
CO2: 23 mmol/L (ref 20–32)
Calcium: 9.7 mg/dL (ref 8.6–10.2)
Chloride: 103 mmol/L (ref 98–110)
Creat: 1.21 mg/dL — ABNORMAL HIGH (ref 0.50–1.10)
Globulin: 4 g/dL (calc) — ABNORMAL HIGH (ref 1.9–3.7)
Glucose, Bld: 149 mg/dL — ABNORMAL HIGH (ref 65–139)
Potassium: 4.4 mmol/L (ref 3.5–5.3)
Sodium: 137 mmol/L (ref 135–146)
Total Bilirubin: 0.3 mg/dL (ref 0.2–1.2)
Total Protein: 7.9 g/dL (ref 6.1–8.1)

## 2019-02-07 LAB — CBC WITH DIFFERENTIAL/PLATELET
Absolute Monocytes: 428 cells/uL (ref 200–950)
Basophils Absolute: 46 cells/uL (ref 0–200)
Basophils Relative: 0.5 %
Eosinophils Absolute: 164 cells/uL (ref 15–500)
Eosinophils Relative: 1.8 %
HCT: 45.4 % — ABNORMAL HIGH (ref 35.0–45.0)
Hemoglobin: 15.1 g/dL (ref 11.7–15.5)
Lymphs Abs: 1492 cells/uL (ref 850–3900)
MCH: 30 pg (ref 27.0–33.0)
MCHC: 33.3 g/dL (ref 32.0–36.0)
MCV: 90.1 fL (ref 80.0–100.0)
MPV: 11.7 fL (ref 7.5–12.5)
Monocytes Relative: 4.7 %
Neutro Abs: 6971 cells/uL (ref 1500–7800)
Neutrophils Relative %: 76.6 %
Platelets: 153 10*3/uL (ref 140–400)
RBC: 5.04 10*6/uL (ref 3.80–5.10)
RDW: 13.3 % (ref 11.0–15.0)
Total Lymphocyte: 16.4 %
WBC: 9.1 10*3/uL (ref 3.8–10.8)

## 2019-02-07 LAB — PROTIME-INR
INR: 1
Prothrombin Time: 10.4 s (ref 9.0–11.5)

## 2019-02-07 NOTE — Assessment & Plan Note (Signed)
Well compensated cirrhosis. Recent u/s stable. She will go for labs to access hepatic function. Encouraged compliance with diabetes management. Congratulated her on sobriety from opioids. Will plan to see her back in 08/2019 or sooner if need arises.

## 2019-02-07 NOTE — Assessment & Plan Note (Signed)
Currently well controlled on pantoprazole bid. Return to the office in 08/2019.

## 2019-02-08 NOTE — Progress Notes (Signed)
Cc'ed to pcp °

## 2019-03-03 ENCOUNTER — Other Ambulatory Visit: Payer: Self-pay

## 2019-03-03 ENCOUNTER — Emergency Department (HOSPITAL_COMMUNITY): Payer: Medicare Other

## 2019-03-03 ENCOUNTER — Emergency Department (HOSPITAL_COMMUNITY)
Admission: EM | Admit: 2019-03-03 | Discharge: 2019-03-03 | Disposition: A | Discharge status: Home / Self Care | Payer: Medicare Other | Attending: Emergency Medicine | Admitting: Emergency Medicine

## 2019-03-03 ENCOUNTER — Encounter (HOSPITAL_COMMUNITY): Payer: Self-pay

## 2019-03-03 DIAGNOSIS — M79641 Pain in right hand: Secondary | ICD-10-CM | POA: Diagnosis present

## 2019-03-03 DIAGNOSIS — F17210 Nicotine dependence, cigarettes, uncomplicated: Secondary | ICD-10-CM | POA: Insufficient documentation

## 2019-03-03 DIAGNOSIS — M79642 Pain in left hand: Secondary | ICD-10-CM | POA: Diagnosis not present

## 2019-03-03 DIAGNOSIS — Z79899 Other long term (current) drug therapy: Secondary | ICD-10-CM | POA: Insufficient documentation

## 2019-03-03 DIAGNOSIS — N189 Chronic kidney disease, unspecified: Secondary | ICD-10-CM | POA: Diagnosis not present

## 2019-03-03 DIAGNOSIS — E1122 Type 2 diabetes mellitus with diabetic chronic kidney disease: Secondary | ICD-10-CM | POA: Diagnosis not present

## 2019-03-03 DIAGNOSIS — L03011 Cellulitis of right finger: Secondary | ICD-10-CM | POA: Diagnosis not present

## 2019-03-03 DIAGNOSIS — I129 Hypertensive chronic kidney disease with stage 1 through stage 4 chronic kidney disease, or unspecified chronic kidney disease: Secondary | ICD-10-CM | POA: Insufficient documentation

## 2019-03-03 MED ORDER — STERILE WATER FOR INJECTION IJ SOLN
INTRAMUSCULAR | Status: AC
  Administered 2019-03-03: 21:00:00 2 mL
  Filled 2019-03-03: qty 10

## 2019-03-03 MED ORDER — SULFAMETHOXAZOLE-TRIMETHOPRIM 800-160 MG PO TABS: 1.0000 | ORAL_TABLET | Freq: Two times a day (BID) | ORAL | 0 refills | Status: DC

## 2019-03-03 MED ORDER — CEFTRIAXONE SODIUM 1 G IJ SOLR
1.0000 g | Freq: Once | INTRAMUSCULAR | Status: AC
  Administered 2019-03-03: 1 g via INTRAMUSCULAR
  Filled 2019-03-03: qty 10

## 2019-03-03 MED ORDER — SODIUM CHLORIDE 0.9 % IV SOLN: 1.0000 g | Freq: Once | INTRAVENOUS | Status: DC

## 2019-03-03 NOTE — ED Provider Notes (Signed)
[facility] EMERGENCY DEPARTMENT Provider Note   CSN: [id number] Arrival date & time: 03/03/19  1823     History Chief Complaint  Patient presents with  . Hand Pain    Jamie Bell is a 49 y.o. female.  The history is provided by the patient. No language interpreter was used.  Hand Pain This is a new problem. The current episode started 12 to 24 hours ago. The problem occurs constantly. The problem has been gradually worsening. Nothing aggravates the symptoms. Nothing relieves the symptoms. She has tried nothing for the symptoms. The treatment provided no relief.  Pt reports she has a history of mrsa. Pt has used Iv drugs in the past but is currently taking suboxone.  Pt denies injecting anything into the area.      Past Medical History:  Diagnosis Date  . Abscess    NECK ANTERIOR  . Chronic back pain   . Cirrhosis ([place])   . Constipation   . DDD (degenerative disc disease)   . Decreased hearing on the right.   F/u by ENT in the past and no further management.  . Depression   . Diabetes mellitus   . Dyspnea    with exertion  . GERD (gastroesophageal reflux disease)   . Hypertension    took med. for HTN, several yrs. ago, no longer needs , seen by Dr. Lattie Haw last yr. during a hosp. at [facility], for MRSA bacteremia   . MRSA bacteremia 2014   Tx with Vancomycin  . NASH (nonalcoholic steatohepatitis)   . PCOS (polycystic ovarian syndrome)   . Pneumonia 2016  . Polysubstance abuse ([place])   . Sciatica    HNP- Cerv. 7- T2    Patient Active Problem List   Diagnosis Date Noted  . GERD (gastroesophageal reflux disease) 03/23/2018  . Hemorrhoids 12/15/2017  . Dysphagia 09/06/2017  . IVDU (intravenous drug user) 05/30/2017  . Status post cervical spinal fusion 05/30/2017  . Abscess in epidural space of cervical spine 05/29/2017  . Cirrhosis ([place]) 09/29/2015  . Type 2 diabetes mellitus with diabetic chronic kidney disease ([place])   . Anxiety state   . Depression   .  Chronic back pain   . Spinal stenosis in cervical region 11/22/2013  . Opiate dependence ([place]) 06/04/2011  . TOBACCO USER 11/03/2008  . HYPERTENSION, BENIGN 06/10/2006  . DM (diabetes mellitus), type 2 ([place]) 04/06/2006  . POLYCYSTIC OVARIAN DISEASE 04/06/2006    Past Surgical History:  Procedure Laterality Date  . ANTERIOR CERVICAL DECOMP/DISCECTOMY FUSION N/A 05/28/2017   Procedure: ANTERIOR CERVICAL DECOMPRESSION CERVICAL THREE-FOUR;  Surgeon: Ashok Pall, MD;  Location: [place];  Service: Neurosurgery;  Laterality: N/A;  anterior  . BACK SURGERY    . BREAST EXCISIONAL BIOPSY Left    negative  . BREAST SURGERY Left    blocked milk ducts   . CARPAL TUNNEL RELEASE    . CATARACT EXTRACTION W/PHACO Left 02/04/2015   Procedure: CATARACT EXTRACTION PHACO AND INTRAOCULAR LENS PLACEMENT (IOC);  Surgeon: [name], MD;  Location: [facility];  Service: Ophthalmology;  Laterality: Left;  CDE: 4.11  . CHOLECYSTECTOMY    . COLONOSCOPY WITH PROPOFOL N/A 11/12/2015   Dr. fields:Small internal hemorrhoids, 2 polyps removed (tubular adenomas) with noted congestion and edema in the left colon most consistent with NSAID use.  Next colonoscopy in 5 to 10 years.  . ESOPHAGEAL BANDING N/A 11/12/2015   Procedure: ESOPHAGEAL BANDING;  Surgeon: [name], MD;  [facility];  Service:  Endoscopy;  Laterality: N/A;  . ESOPHAGOGASTRODUODENOSCOPY  2007   Dr. Oneida Alar: mild antral erythema. Future procedures need to be done with Propofol per notes.   . ESOPHAGOGASTRODUODENOSCOPY (EGD) WITH PROPOFOL N/A 11/12/2015   Dr. Oneida Alar: Normal esophagus with no varices.  Gastritis and duodenitis, biopsy without H. pylori, most consistent with NSAID/aspirin use.  . INCISION AND DRAINAGE WOUND WITH TENDON REPAIR Right 07/03/2016   Procedure: INCISION AND DRAINAGE RIGHT LONG PROXIMAL INTERPHALANGEAL JOINT WOUND WITH  TENDON REPAIR AND CULTURES;  Surgeon: [name], MD;  Location: [place];  Service: Orthopedics;   Laterality: Right;  . NECK SURGERY    . PERIPHERALLY INSERTED CENTRAL CATHETER INSERTION Right 2007   for treatment with Vancomycin   . POLYPECTOMY  11/12/2015   Procedure: POLYPECTOMY;  Surgeon: [name], MD;  [facility];  Service: Endoscopy;;  transverse colon polyp  . POSTERIOR CERVICAL LAMINECTOMY N/A 11/22/2013   Procedure: POSTERIOR CERVICAL LAMINECTOMY C7/T1, T/1-2;  Surgeon: [name], MD;  Location: [place] [facility];  Service: Neurosurgery;  Laterality: N/A;  POSTERIOR CERVICAL LAMINECTOMY C7/T1, T/1-2  . TEE WITHOUT CARDIOVERSION N/A 08/09/2012   Procedure: TRANSESOPHAGEAL ECHOCARDIOGRAM (TEE);  Surgeon: [name], MD;  [facility];  Service: Cardiovascular;  Laterality: N/A;     OB History    Gravida      Para      Term      Preterm      AB      Living  0     SAB      TAB      Ectopic      Multiple      Live Births              Family History  Problem Relation Age of Onset  . Hypertension Mother   . Hypertension Father   . Cirrhosis Father        Drank some alcohol  . Diabetes Brother   . Diabetes Other   . Cirrhosis Paternal Aunt        No alcohol  . Cirrhosis Paternal Aunt        No alcohol  . Colon cancer Neg Hx     Social History   Tobacco Use  . Smoking status: Current Every Day Smoker    Packs/day: 1.00    Years: 20.00    Pack years: 20.00    Types: Cigarettes  . Smokeless tobacco: Never Used  Substance Use Topics  . Alcohol use: Yes    Comment: social  . Drug use: Yes    Types: IV, Marijuana    Comment: prior history of cocaine use; 03/03/19 marijuana now    Home Medications Prior to Admission medications   Medication Sig Start Date End Date Taking? Authorizing Provider  Aspirin-Acetaminophen-Caffeine (EXCEDRIN PO) Take by mouth as needed.    [provider]  Biotin w/ Vitamins C & E (HAIR/SKIN/NAILS PO) Take 1 tablet by mouth daily.    [provider]  Buprenorphine  HCl-Naloxone HCl 8-2 MG FILM Place 1 Film under the tongue 3 (three) times daily.  05/18/17   [provider]  furosemide (LASIX) 40 MG tablet Take 40 mg by mouth daily. 01/09/19   [provider]  lactulose (CHRONULAC) 10 GM/15ML solution TAKE 30 MLS BY MOUTH 2 TIMES A DAY AS NEEDED FOR MILD CONSTIPATION. 01/25/19   Carlis Stable, NP  pantoprazole (PROTONIX) 40 MG tablet Take 1 tablet (40 mg total)  by mouth 2 (two) times daily. 10/02/15   Sinda Du, MD  pravastatin (PRAVACHOL) 40 MG tablet Take 40 mg by mouth daily. 12/27/18   [provider]  sulfamethoxazole-trimethoprim (BACTRIM DS) 800-160 MG tablet Take 1 tablet by mouth 2 (two) times daily. 03/03/19   Fransico Meadow, PA-C  SYNJARDY XR 12.05-998 MG TB24 Take 1 tablet by mouth 2 (two) times daily. 12/27/18   [provider]  tiotropium (SPIRIVA) 18 MCG inhalation capsule Place 18 mcg into inhaler and inhale daily.     [provider]    Allergies    Chantix [varenicline], Ciprofloxacin, Nsaids, Shrimp [shellfish allergy], and Tylenol [acetaminophen]  Review of Systems   Review of Systems  All other systems reviewed and are negative.   Physical Exam Updated Vital Signs BP (!) 130/97 (BP Location: Right Arm)   Pulse (!) 120   Temp 98.2 F (36.8 C) (Oral)   Resp 18   Ht 5' 5"  (1.651 m)   Wt 93 kg   SpO2 95%   BMI 34.11 kg/m   Physical Exam Vitals reviewed.  Musculoskeletal:        General: Swelling and tenderness present.     Comments: Slight redness right hand at area of 2nd metacarpal,  From, nv and ns intact,   Skin:    General: Skin is warm.  Neurological:     General: No focal deficit present.     Mental Status: She is alert.  Psychiatric:        Mood and Affect: Mood normal.     ED Results / Procedures / Treatments   Labs (all labs ordered are listed, but only abnormal results are displayed) Labs Reviewed - No data to display  EKG None  Radiology DG Hand  Complete Left  Result Date: 03/03/2019 CLINICAL DATA:  Left hand pain and swelling. EXAM: LEFT HAND - COMPLETE 3+ VIEW COMPARISON:  No comparison studies available. FINDINGS: No fracture. No subluxation or dislocation. Degenerative changes are noted in the IP joints. No worrisome lytic or sclerotic osseous abnormality. IMPRESSION: Degenerative changes in the IP joints. Otherwise no acute bony abnormality. Electronically Signed   By: Misty Stanley M.D.   On: 03/03/2019 20:36    Procedures Procedures (including critical care time)  Medications Ordered in ED Medications  cefTRIAXone (ROCEPHIN) injection 1 g (has no administration in time range)    ED Course  I have reviewed the triage vital signs and the nursing notes.  Pertinent labs & imaging results that were available during my care of the patient were reviewed by me and considered in my medical decision making (see chart for details).    MDM Rules/Calculators/A&P                      MDM:  Pt given rocephin 1gram IM here.  Rx for bactrim,  Pt has multiple scratches and broken areas of skin, most likely MRSA.  Pt advised to recheck in 24 hours if not improving.  Final Clinical Impression(s) / ED Diagnoses Final diagnoses:  Cellulitis of finger of right hand    Rx / DC Orders ED Discharge Orders         Ordered    sulfamethoxazole-trimethoprim (BACTRIM DS) 800-160 MG tablet  2 times daily     03/03/19 2101        An After Visit Summary was printed and given to the patient.    Fransico Meadow, [place] 03/03/19 2110  Daleen Bo, MD 03/04/19 [phone]

## 2019-03-03 NOTE — ED Triage Notes (Addendum)
Pt presents to ED with complaints of left hand pain and swelling. Pt states she woke up Sunday with pain and swelling. Pt denies injury. Pt denies fever.

## 2019-03-03 NOTE — Discharge Instructions (Addendum)
Return for recheck if not improving in 24 hours. Soak area 20 minutes 4 times a day. Take

## 2019-04-03 ENCOUNTER — Other Ambulatory Visit: Payer: Self-pay | Admitting: Internal Medicine

## 2019-04-17 ENCOUNTER — Other Ambulatory Visit: Payer: Self-pay | Admitting: Family Medicine

## 2019-04-21 ENCOUNTER — Other Ambulatory Visit: Payer: Self-pay

## 2019-04-21 NOTE — Patient Outreach (Signed)
[facility]) Care Management  04/21/2019  Jamie Bell Jan 07, 1971 [id number]   Medication Adherence call to Jamie Bell patients telephone number is disconnected.  Jamie Bell is showing past due on Synjardy Xr under [place].   [facility] Management Direct Dial [phone]  Fax [phone] [email or web address]

## 2019-04-28 ENCOUNTER — Other Ambulatory Visit: Payer: Self-pay | Admitting: Internal Medicine

## 2019-05-24 DIAGNOSIS — D1039 Benign neoplasm of other parts of mouth: Secondary | ICD-10-CM | POA: Diagnosis not present

## 2019-06-05 ENCOUNTER — Telehealth: Payer: Self-pay

## 2019-06-05 MED ORDER — TRULANCE 3 MG PO TABS: 3.0000 mg | ORAL_TABLET | Freq: Every day | ORAL | 5 refills | Status: DC

## 2019-06-05 NOTE — Telephone Encounter (Signed)
Pt is aware that RX was sent into her pharmacy.

## 2019-06-05 NOTE — Addendum Note (Signed)
Addended by: Mahala Menghini on: 06/05/2019 04:15 PM   Modules accepted: Orders

## 2019-06-05 NOTE — Telephone Encounter (Signed)
Pt called with c/o constipation. Pt has previously tried Amitiza, Trulance, Miralax and otc products. Pt says it takes her 2 days to have a BM when taking Miralax. Pt is taking one dose of Miralax daily and has a BM every few days. Pt is aksing if Trulance can be sent to her pharmacy. Pt states the Amitiza was very harsh on her stomach. Pt also states she will increase her fluids to help with BM.

## 2019-06-05 NOTE — Telephone Encounter (Signed)
RX done

## 2019-06-06 ENCOUNTER — Telehealth: Payer: Self-pay | Admitting: Gastroenterology

## 2019-06-06 NOTE — Telephone Encounter (Signed)
Called pt. Work on [place] when received.

## 2019-06-06 NOTE — Telephone Encounter (Signed)
PATIENT CALLED STATING HER MEDICATION NEEDS A PRIOR AUTH, I TOLD HER THE PHARMACY WOULD FAX THAT INFO OVER TO [place] AND SHE ALSO SAID THAT SHE NEEDS SOMETHING TO TAKE  UNTIL THAT CAN GET COMPLETED.

## 2019-06-07 NOTE — Telephone Encounter (Signed)
PA for Trulance has been approved. Will scan approval letter when received. Pt is aware of approval.

## 2019-06-07 NOTE — Telephone Encounter (Signed)
Pharmacy was notified of approval and will deliver medication.

## 2019-06-09 ENCOUNTER — Telehealth: Payer: Self-pay | Admitting: Gastroenterology

## 2019-06-09 NOTE — Telephone Encounter (Signed)
Pt called to say that since her PCP retired she wanted [place] to refill her diabetic meds and she also had questions about another prescription that her pharmacy told her to call us about. I transferred to LL VM. Please advise. [phone]

## 2019-06-12 ENCOUNTER — Telehealth: Payer: Self-pay | Admitting: Emergency Medicine

## 2019-06-12 NOTE — Telephone Encounter (Signed)
Called spoke to pt. Note sent to provider requesting rx. Waiting on response

## 2019-06-12 NOTE — Telephone Encounter (Signed)
Pt called name and dob were verified. Pt stated she was seeing doctor hawkins before her retired but now that he is retires she does not have a pcp. Pt stated she usually sees ll but stated she is out of pravasatian 80m once a day and synjardy xr 12.5-1000mg bid and wants to know if we are able to refill this medication for her. Her next appt here in our office is not until aug

## 2019-06-14 ENCOUNTER — Other Ambulatory Visit: Payer: Self-pay | Admitting: Gastroenterology

## 2019-06-14 ENCOUNTER — Telehealth: Payer: Self-pay | Admitting: Gastroenterology

## 2019-06-14 NOTE — Telephone Encounter (Signed)
PATIENT CALLED ASKING ABOUT THE MEDS SHE REQUESTED ERIC TO SEND IN FOR HER YESTERDAY, WANTED TO KNOW THE STATUS

## 2019-06-14 NOTE — Telephone Encounter (Signed)
We generally don't refill non-GI medications. Has she made any attempts to establish with a PCP since he retired in [place]?

## 2019-06-14 NOTE — Telephone Encounter (Signed)
Note sent to provider. Waiting on response, see previous note

## 2019-06-14 NOTE — Telephone Encounter (Signed)
Called pt notified her of providers generally dont refill non- gi medications to f/u with a pcp. Pt stated that she deos not have one but she  would f/u with one and thanked me for the call

## 2019-06-17 ENCOUNTER — Other Ambulatory Visit: Payer: Self-pay | Admitting: Gastroenterology

## 2019-06-20 NOTE — Telephone Encounter (Signed)
Please disregard per pt.

## 2019-06-20 NOTE — Telephone Encounter (Signed)
Received refill request for Synjardy. We do not prescribe this medication. She will need to get this from PCP.

## 2019-07-05 ENCOUNTER — Telehealth: Payer: Self-pay | Admitting: Gastroenterology

## 2019-07-05 DIAGNOSIS — K7460 Unspecified cirrhosis of liver: Secondary | ICD-10-CM

## 2019-07-05 NOTE — Telephone Encounter (Signed)
[phone]  patient called and said that her stool was watery, may need to change medication

## 2019-07-05 NOTE — Telephone Encounter (Signed)
Called pt verified name and dob  pt stated the trulance is making her have watery stools and she thinks it is to much. Pt stated she d/c trulance on 6/3 but she did take one tablet today She wants to know if there is anything else she can take.  Also, pt wants to know if she can have her [place] before her appt in august.  That way when she comes in and sees the provider she will able to review her results with her during her visit.  Notified her that  ll is out of the office and will not return until 6/14 and that I would send this to another provider. Pt declined and stated she only wanted ll and would wait until she returns for recommendations

## 2019-07-06 NOTE — Telephone Encounter (Signed)
Noted. Will wait for LSL to return

## 2019-07-09 MED ORDER — LUBIPROSTONE 8 MCG PO CAPS: ORAL_CAPSULE | ORAL | 2 refills | Status: DC

## 2019-07-09 NOTE — Addendum Note (Signed)
Addended by: Mahala Menghini on: 07/09/2019 08:23 PM   Modules accepted: Orders

## 2019-07-09 NOTE — Telephone Encounter (Signed)
She needs ruq u/s and cbc, cmet, pt/inr prior to 08/2019 OV. Please make arrangements.   For bowels: stop Trulance. Hold lactulose (if she is taking). If diarrhea clears up, then she can try Amitiza 46mg once to twice daily with food for constipation if needed. RX sent to pharmacy.  If diarrhea does not clear up after stopping Trulance and lactulose, she needs to let u[place].

## 2019-07-10 ENCOUNTER — Other Ambulatory Visit: Payer: Self-pay | Admitting: Emergency Medicine

## 2019-07-10 ENCOUNTER — Telehealth: Payer: Self-pay | Admitting: Emergency Medicine

## 2019-07-10 DIAGNOSIS — K7460 Unspecified cirrhosis of liver: Secondary | ICD-10-CM

## 2019-07-10 MED ORDER — LUBIPROSTONE 8 MCG PO CAPS
8.0000 ug | ORAL_CAPSULE | Freq: Two times a day (BID) | ORAL | 0 refills | Status: DC
Start: 2019-07-10 — End: 2019-07-10

## 2019-07-10 NOTE — Telephone Encounter (Signed)
Called verified name and dob  notified pt of recommendations. pt is agreeable to labs and ruq u/s prior to 8/21 appt Orders for labs were placed in epic, sent to [facility] Pt agreeable to d/c trulance and an rx for amitiza 31mg  was [facility]stated she would call uKoreaif she needed u[place]oreaand thanked me for the call

## 2019-07-10 NOTE — Addendum Note (Signed)
Addended by: Hassan Rowan on: 07/10/2019 10:32 AM   Modules accepted: Orders

## 2019-07-10 NOTE — Telephone Encounter (Signed)
[place] abd ruq scheduled for 08/28/19 at 9:30am, arrive at 9:15am. NPO after midnight prior to test.  Called and informed pt of [place] appt. Appt letter mailed. Also mailed lab order per pt request.

## 2019-07-10 NOTE — Telephone Encounter (Signed)
Duplicate order placed for amitiza, d/c order for rx made today and notified Psychiatrist. Pharmacy stated they filled the rx that was sent in on   6/13  With 2 refills and it is ready for pick up, pt notified

## 2019-07-11 NOTE — Addendum Note (Signed)
Addended by: Mahala Menghini on: 07/11/2019 09:23 PM   Modules accepted: Orders

## 2019-07-24 ENCOUNTER — Other Ambulatory Visit: Payer: Self-pay | Admitting: Emergency Medicine

## 2019-07-24 DIAGNOSIS — K7460 Unspecified cirrhosis of liver: Secondary | ICD-10-CM

## 2019-08-01 ENCOUNTER — Telehealth: Payer: Self-pay | Admitting: Pulmonary Disease

## 2019-08-01 NOTE — Telephone Encounter (Signed)
Okay to send refill for spiriva.

## 2019-08-01 NOTE — Telephone Encounter (Signed)
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 PT has appt to see Dr Halford Chessman 09/12/19. Please advise if ok to send refill.

## 2019-08-01 NOTE — Telephone Encounter (Signed)
LMTC x 1  

## 2019-08-04 DIAGNOSIS — G8929 Other chronic pain: Secondary | ICD-10-CM | POA: Diagnosis not present

## 2019-08-04 DIAGNOSIS — E119 Type 2 diabetes mellitus without complications: Secondary | ICD-10-CM | POA: Diagnosis not present

## 2019-08-04 DIAGNOSIS — R300 Dysuria: Secondary | ICD-10-CM | POA: Diagnosis not present

## 2019-08-04 DIAGNOSIS — H61899 Other specified disorders of external ear, unspecified ear: Secondary | ICD-10-CM | POA: Diagnosis not present

## 2019-08-04 NOTE — Telephone Encounter (Signed)
Spoke with pt. She is aware that her medication has been refilled. Nothing further was needed. 

## 2019-08-09 ENCOUNTER — Other Ambulatory Visit: Payer: Self-pay | Admitting: Gastroenterology

## 2019-08-09 ENCOUNTER — Ambulatory Visit (INDEPENDENT_AMBULATORY_CARE_PROVIDER_SITE_OTHER): Payer: Medicare Other | Admitting: Obstetrics and Gynecology

## 2019-08-09 ENCOUNTER — Other Ambulatory Visit (HOSPITAL_COMMUNITY)
Admission: RE | Admit: 2019-08-09 | Discharge: 2019-08-09 | Disposition: A | Discharge status: Home / Self Care | Payer: Medicare Other | Source: Ambulatory Visit | Attending: Obstetrics and Gynecology | Admitting: Obstetrics and Gynecology

## 2019-08-09 ENCOUNTER — Encounter: Payer: Self-pay | Admitting: Obstetrics and Gynecology

## 2019-08-09 VITALS — BP 127/67 | HR 111 | Ht 65.0 in | Wt 206.4 lb

## 2019-08-09 DIAGNOSIS — Z124 Encounter for screening for malignant neoplasm of cervix: Secondary | ICD-10-CM | POA: Insufficient documentation

## 2019-08-09 DIAGNOSIS — Z1151 Encounter for screening for human papillomavirus (HPV): Secondary | ICD-10-CM | POA: Insufficient documentation

## 2019-08-09 DIAGNOSIS — K7460 Unspecified cirrhosis of liver: Secondary | ICD-10-CM | POA: Diagnosis not present

## 2019-08-09 DIAGNOSIS — Z113 Encounter for screening for infections with a predominantly sexual mode of transmission: Secondary | ICD-10-CM | POA: Diagnosis not present

## 2019-08-09 DIAGNOSIS — Z01419 Encounter for gynecological examination (general) (routine) without abnormal findings: Secondary | ICD-10-CM | POA: Insufficient documentation

## 2019-08-09 NOTE — Patient Instructions (Signed)
Weight loss guidelines and tips   1. Utilize measuring cups and spoons (found at a low price at [facility]) to monitor and manage serving sizes. Use them to carefully measure out serving sizes listed on food packaging to prevent overeating and better manage caloric intake. Most of [place] tell ourselves lies about how much a serving really is. For example, a serving of meat is actually 4 oz, which is the size of a deck of cards!!!   2. Utilize smart phone apps like "My Net Diary", "My Fitness Pal", "Lose It", or "Eat Better" to keep track of calorie intake and exercise. Also consider purchasing a pedometer to keep track of daily steps. Some smart phones have built in pedometers to keep track of step counts (your goal is 10,000 steps per day).   3. Make sure you are consuming enough water daily. Drink 8 oz prior to meals, or at snack time, to cut down on overeating.   4. Consider exercise programs. [employer] offers water aerobics classes that are low-impact to minimize joint pains while still burning calories. For maximum impact at home, do small exercises while watching TV during commercial breaks.   The Truth Table:   3,500 calories = 1 pound of fat  Minus 500 calories a day x 7 days will lose you 1 pound  Exercise helps, but one mile walk = 200 calories burned   10,000 steps is your goal for the day, that's 4-5 miles!   *Please cut out and put on your refrigerator*   PLEASE GO READ THESE VERY SIMPLE ARTICLES ON THE BENEFITS OF WEIGHT LOSS, RELEASING TOXINS, AND THE OTHER THINGS FAT CELLS "HOARD"  MassJudge.si  http://www.garrett.info/    ITrackBites app for weight loss management  Patient Education   Weight Loss Diet  About this topic  There are many "trendy" weight loss diets that are popular  today. Many of these diets can end up being more harmful than helpful. The healthiest way to lose weight is to burn more calories than you eat.  A weight loss diet should help you have a healthy view of eating. It is NOT healthy to stop eating to try and lose weight. A good diet plan will help you cut down your food intake and make healthy choices.  A healthy weight loss goal is 1 to 2 pounds (0.5 to 1 kg) per week. It takes 3500 calories to lose 1 pound (0.5 kg). That means cutting out 500 calories every day for 7 days. You can lose these 500 calories per day by eating fewer calories, burning them through exercise, or both.  It may be easier than you think to cut 500 calories from your daily intake. You can:   Switch from whole milk to 1% or skim milk   Switch from regular cheese to fat-free cheese   Use healthier condiment choices  ? Fat-free sour cream or salad dressings  ? Spray butter  ? Diet syrups or jellies over regular   Try frozen yogurt as a dessert rather than eating ice cream.   Skip the chips. Snack on carrots, vegetables, or fruit. If chips are a favorite of yours, try the baked style.   Eat boiled, seared fish or skinless chicken rather than red meat.   Try flavored no-calorie waters. Do not drink soda and juices that have many calories.  General   Eating smaller meals more often may be helpful. This will help keep your metabolism high and your hunger low. This will  keep you from overeating at your next meal. Also, eating meals slowly helps you feel full faster.  ? If eating 3 meals is a part of your lifestyle, choose more low-fat proteins and higher fiber to fill you up at each meal. Skip the snack at night if you can. Drink a calorie-free beverage instead.   Do not skip meals. Most often if you skip a meal, you eat too much at the next meal.   Eat smaller portions. Use a smaller plate or bowl for meals, and when you are eating out, eat half and take the rest home.    Plan ahead. Plan your meals and grocery list before going to the store. Planning will keep you from getting meals from fast foods or restaurants.   Do not go to the grocery store hungry. You are more likely to buy snacks that are not good for you.   Portion out snacks. When you are having a snack, instead of grabbing the whole bag, portion a small amount out to give yourself a stopping point.   Drink water before and after your meals to help fill you up without the calories.   When eating starchy foods, choose whole-grain products. These have a lot of fiber which will make you feel full. Fiber also helps lower cholesterol and helps with bowel function.   If you need a helpful start, ask your doctor to send you to a dietitian for weight loss help.    What will the results be?  Losing excess weight will make your whole body healthier. You will have more energy for your daily activities and lower your risk for health problems.  What lifestyle changes are needed?  Stay active. Eating healthy is not always enough to lose weight. Burning calories by exercising is a big part of weight loss.  What foods are good to eat?  The key is to watch your portion sizes. It is best to choose foods that are lower in fat and calories.   Choose low-fat meats:  ? Boneless chicken breast  ? Pork loin  ? 90% lean beef  ? Lean [place] meat  ? Fresh fish (not fried)   Choose low-fat dairy products:  ? 1% or skim milk  ? Spray butter or margarine  ? Low-fat or fat-free cheese  ? Frozen yogurt or low calorie ice cream   Choose fresh fruits, green vegetables, beans and lentils, and whole wheat products more often.   Choose smart snacks:  ? Baked chips  ? Pretzels  ? Popcorn with no butter ? use pepper, garlic, or another spice to taste  ? High fiber, low-fat crackers  ? Reduced fat cookies  ? Diet or no-calorie beverages  What foods should be limited or avoided?  Limit high-fat, high-sodium, and  high-calorie foods like:   Fried foods   Processed meats   Whole-fat dairy products   Candy, cookies, chips, pastries   Sausage, bacon, any full-fat meats   Soda, juice   Beer, wine, and mixed drinks (alcohol)  Will there be any other care needed?  What do I do first before trying to lose weight?   Talk to your doctor and dietitian to see if you need to lose weight. Work with them to set your weight loss goals.   If you have a chronic illness, such as high blood sugar or high blood pressure, ask a doctor or dietitian what diet and exercise is right for you.   Ask your doctor  about how much you are able to exercise and what type of exercise is good for you.  Helpful tips   Keep a food journal to help keep you on track.   Do not eat 2 hours before bedtime.   Join a support group.  Tips for burning calories:   If your workplace is near your house, choose to walk or bike to work instead of driving.   Take 20-minute walks each day. Walk around during your lunch break. You will not only burn calories, but raise your energy for the rest of the day.   Take the stairs over the elevators.   Join a gym or exercise class with a friend.   Try to exercise 30 minutes a day. Three 10 minute sessions works too.   Drink lots of water before, during, and after exercise.  Where can I learn more?  CashmereCloseouts.hu  CheerfulGuy.tn  CashmereCloseouts.hu  SharpAnalyst.uy  Weight-Control Information Network  PreferredVet.ca  Weight-Control Information Network  http://www.white-smith.com/  Last Reviewed Date  2014-10-23  Consumer Information Use and Disclaimer  This information is not specific medical advice and does not replace information you receive from your health care provider. This is only a brief summary of  general information. It does NOT include all information about conditions, illnesses, injuries, tests, procedures, treatments, therapies, discharge instructions or life-style choices that may apply to you. You must talk with your health care provider for complete information about your health and treatment options. This information should not be used to decide whether or not to accept your health care provider's advice, instructions or recommendations. Only your health care provider has the knowledge and training to provide advice that is right for you.  Copyright  Copyright  2019 [employer] and its affiliates and/or licensors. All rights reserved.
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
## 2019-08-10 ENCOUNTER — Telehealth: Payer: Self-pay | Admitting: Emergency Medicine

## 2019-08-10 DIAGNOSIS — N189 Chronic kidney disease, unspecified: Secondary | ICD-10-CM | POA: Diagnosis not present

## 2019-08-10 DIAGNOSIS — E119 Type 2 diabetes mellitus without complications: Secondary | ICD-10-CM | POA: Diagnosis not present

## 2019-08-10 DIAGNOSIS — E782 Mixed hyperlipidemia: Secondary | ICD-10-CM | POA: Diagnosis not present

## 2019-08-10 DIAGNOSIS — Z5181 Encounter for therapeutic drug level monitoring: Secondary | ICD-10-CM | POA: Diagnosis not present

## 2019-08-10 DIAGNOSIS — R000 Tachycardia, unspecified: Secondary | ICD-10-CM | POA: Diagnosis not present

## 2019-08-10 LAB — CBC/DIFF AMBIGUOUS DEFAULT
Basophils Absolute: 0.1 10*3/uL (ref 0.0–0.2)
Basos: 1 %
EOS (ABSOLUTE): 0.2 10*3/uL (ref 0.0–0.4)
Eos: 2 %
Hematocrit: 40.6 % (ref 34.0–46.6)
Hemoglobin: 13.5 g/dL (ref 11.1–15.9)
Immature Grans (Abs): 0.1 10*3/uL (ref 0.0–0.1)
Immature Granulocytes: 1 %
Lymphocytes Absolute: 1.5 10*3/uL (ref 0.7–3.1)
Lymphs: 19 %
MCH: 30.4 pg (ref 26.6–33.0)
MCHC: 33.3 g/dL (ref 31.5–35.7)
MCV: 91 fL (ref 79–97)
Monocytes Absolute: 0.6 10*3/uL (ref 0.1–0.9)
Monocytes: 8 %
Neutrophils Absolute: 5.5 10*3/uL (ref 1.4–7.0)
Neutrophils: 69 %
Platelets: 194 10*3/uL (ref 150–450)
RBC: 4.44 x10E6/uL (ref 3.77–5.28)
RDW: 14.3 % (ref 11.7–15.4)
WBC: 7.9 10*3/uL (ref 3.4–10.8)

## 2019-08-10 LAB — COMPREHENSIVE METABOLIC PANEL
ALT: 69 IU/L — ABNORMAL HIGH (ref 0–32)
AST: 77 IU/L — ABNORMAL HIGH (ref 0–40)
Albumin/Globulin Ratio: 1 — ABNORMAL LOW (ref 1.2–2.2)
Albumin: 3.9 g/dL (ref 3.8–4.8)
Alkaline Phosphatase: 133 IU/L — ABNORMAL HIGH (ref 48–121)
BUN/Creatinine Ratio: 19 (ref 9–23)
BUN: 23 mg/dL (ref 6–24)
Bilirubin Total: 0.3 mg/dL (ref 0.0–1.2)
CO2: 19 mmol/L — ABNORMAL LOW (ref 20–29)
Calcium: 9.2 mg/dL (ref 8.7–10.2)
Chloride: 103 mmol/L (ref 96–106)
Creatinine, Ser: 1.18 mg/dL — ABNORMAL HIGH (ref 0.57–1.00)
GFR calc Af Amer: 63 mL/min/{1.73_m2} (ref >59)
GFR calc non Af Amer: 55 mL/min/{1.73_m2} — ABNORMAL LOW (ref >59)
Globulin, Total: 3.9 g/dL (ref 1.5–4.5)
Glucose: 231 mg/dL — ABNORMAL HIGH (ref 65–99)
Potassium: 3.8 mmol/L (ref 3.5–5.2)
Sodium: 142 mmol/L (ref 134–144)
Total Protein: 7.8 g/dL (ref 6.0–8.5)

## 2019-08-10 LAB — HIV ANTIBODY (ROUTINE TESTING W REFLEX): HIV Screen 4th Generation wRfx: NONREACTIVE

## 2019-08-10 LAB — SPECIMEN STATUS REPORT

## 2019-08-10 LAB — RPR: RPR Ser Ql: NONREACTIVE

## 2019-08-10 LAB — PROTIME-INR
INR: 1.1 (ref 0.9–1.2)
Prothrombin Time: 11.6 s (ref 9.1–12.0)

## 2019-08-10 NOTE — Telephone Encounter (Signed)
[facility] results placed in ll box

## 2019-08-10 NOTE — Telephone Encounter (Signed)
Reviewed labs dated 08/09/2019:  White blood cell count 7900, hemoglobin 13.5, platelets 194,000, glucose 231, BUN 23, creatinine 1.18, sodium 142, potassium 3.8, albumin 3.9, total bilirubin 0.3, alkaline phosphatase 133, AST 77, ALT 69, INR 1.1.  Her LFTS are up. MELD is 9 essentially stable. Await u/s.  Please let pt know she needs to keep upcoming ov to address bump in lfts.

## 2019-08-10 NOTE — Telephone Encounter (Signed)
Called notified pt of providers recommendations

## 2019-08-16 LAB — CYTOLOGY - PAP
Chlamydia: NEGATIVE
Comment: NEGATIVE
Comment: NEGATIVE
Comment: NEGATIVE
Comment: NORMAL
Diagnosis: NEGATIVE
HPV 16: NEGATIVE
HPV 18 / 45: NEGATIVE
High risk HPV: POSITIVE — AB
Neisseria Gonorrhea: NEGATIVE

## 2019-08-17 ENCOUNTER — Telehealth: Payer: Self-pay | Admitting: *Deleted

## 2019-08-17 NOTE — Progress Notes (Signed)
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

## 2019-08-17 NOTE — Telephone Encounter (Signed)
I called Cytology to add reflex cytology to pap. Cytology says it was negative for 16 18 & 45. I let Dr. Glo Herring know. He recommends repeat pap in 1 year. Pt aware of results and will come back in for pap in 1 year. [place]

## 2019-08-21 DIAGNOSIS — H61811 Exostosis of right external canal: Secondary | ICD-10-CM | POA: Diagnosis not present

## 2019-08-21 DIAGNOSIS — H66001 Acute suppurative otitis media without spontaneous rupture of ear drum, right ear: Secondary | ICD-10-CM | POA: Diagnosis not present

## 2019-08-21 DIAGNOSIS — H7201 Central perforation of tympanic membrane, right ear: Secondary | ICD-10-CM | POA: Diagnosis not present

## 2019-08-22 DIAGNOSIS — G8929 Other chronic pain: Secondary | ICD-10-CM | POA: Diagnosis not present

## 2019-08-22 DIAGNOSIS — E119 Type 2 diabetes mellitus without complications: Secondary | ICD-10-CM | POA: Diagnosis not present

## 2019-08-22 DIAGNOSIS — E782 Mixed hyperlipidemia: Secondary | ICD-10-CM | POA: Diagnosis not present

## 2019-08-22 DIAGNOSIS — J449 Chronic obstructive pulmonary disease, unspecified: Secondary | ICD-10-CM | POA: Diagnosis not present

## 2019-08-25 NOTE — Progress Notes (Signed)
Higher risk HPV virus noted, so recommendation is f/u in 1 yr, as 5 year risk of a severe abnormal result is 4.8%

## 2019-08-28 ENCOUNTER — Other Ambulatory Visit: Payer: Self-pay

## 2019-08-28 ENCOUNTER — Ambulatory Visit (HOSPITAL_COMMUNITY)
Admission: RE | Admit: 2019-08-28 | Discharge: 2019-08-28 | Disposition: A | Discharge status: Home / Self Care | Payer: Medicare Other | Source: Ambulatory Visit | Attending: Gastroenterology | Admitting: Gastroenterology

## 2019-08-28 DIAGNOSIS — K7460 Unspecified cirrhosis of liver: Secondary | ICD-10-CM | POA: Diagnosis not present

## 2019-08-28 DIAGNOSIS — K7689 Other specified diseases of liver: Secondary | ICD-10-CM | POA: Diagnosis not present

## 2019-08-29 ENCOUNTER — Telehealth: Payer: Self-pay | Admitting: *Deleted

## 2019-08-29 NOTE — Telephone Encounter (Signed)
Patient informed per Dr Glo Herring higher risk HPV virus noted, so recommendation is f/u in 1 yr, as 5 year risk of a severe abnormal result is 4.8%. Pt verbalized understanding and placed on recall list for 1 year.

## 2019-08-29 NOTE — Telephone Encounter (Signed)
LMOVM requesting patient return call regarding pap smear results.  Needs 1 year f/u for +HPV

## 2019-09-06 ENCOUNTER — Encounter: Payer: Self-pay | Admitting: Internal Medicine

## 2019-09-06 ENCOUNTER — Telehealth: Payer: Self-pay | Admitting: *Deleted

## 2019-09-06 ENCOUNTER — Encounter: Payer: Self-pay | Admitting: *Deleted

## 2019-09-06 ENCOUNTER — Encounter: Payer: Self-pay | Admitting: Gastroenterology

## 2019-09-06 ENCOUNTER — Telehealth (INDEPENDENT_AMBULATORY_CARE_PROVIDER_SITE_OTHER): Payer: Medicare Other | Admitting: Gastroenterology

## 2019-09-06 ENCOUNTER — Other Ambulatory Visit: Payer: Self-pay

## 2019-09-06 DIAGNOSIS — K5900 Constipation, unspecified: Secondary | ICD-10-CM

## 2019-09-06 DIAGNOSIS — K7460 Unspecified cirrhosis of liver: Secondary | ICD-10-CM | POA: Diagnosis not present

## 2019-09-06 DIAGNOSIS — K219 Gastro-esophageal reflux disease without esophagitis: Secondary | ICD-10-CM

## 2019-09-06 DIAGNOSIS — R1310 Dysphagia, unspecified: Secondary | ICD-10-CM

## 2019-09-06 DIAGNOSIS — K760 Fatty (change of) liver, not elsewhere classified: Secondary | ICD-10-CM

## 2019-09-06 DIAGNOSIS — R1319 Other dysphagia: Secondary | ICD-10-CM

## 2019-09-06 MED ORDER — LUBIPROSTONE 24 MCG PO CAPS
24.0000 ug | ORAL_CAPSULE | Freq: Two times a day (BID) | ORAL | 3 refills | Status: DC
Start: 2019-09-06 — End: 2019-10-17

## 2019-09-06 MED ORDER — DIAZEPAM 10 MG PO TABS
ORAL_TABLET | ORAL | 0 refills | Status: DC
Start: 2019-09-06 — End: 2019-10-17

## 2019-09-06 NOTE — Patient Instructions (Addendum)
1. Stop Amitiza 8 mcg.  Start Amitiza 24 mcg twice daily with food for constipation.  Prescription sent to your pharmacy. 2. Please go to [facility] for labs.  You need to have these done at least 3 or 4 days before your MRI. 3. MRI abdomen/liver to be scheduled.  Our office will contact you with further information. 4. Valium 10 mg once, 1 hour prior to MRI to help with anxiety.  Do not drive due to risk of sedation. 5. We will contact you to schedule upper endoscopy in the near future with Dr. Abbey Chatters. 6. Return to the office in 6 months.

## 2019-09-06 NOTE — Telephone Encounter (Signed)
Jamie Bell, you are scheduled for a virtual visit with your provider today.  Just as we do with appointments in the office, we must obtain your consent to participate.  Your consent will be active for this visit and any virtual visit you may have with one of our providers in the next 365 days.  If you have a MyChart account, I can also send a copy of this consent to you electronically.  All virtual visits are billed to your insurance company just like a traditional visit in the office.  As this is a virtual visit, video technology does not allow for your provider to perform a traditional examination.  This may limit your provider's ability to fully assess your condition.  If your provider identifies any concerns that need to be evaluated in person or the need to arrange testing such as labs, EKG, etc, we will make arrangements to do so.  Although advances in technology are sophisticated, we cannot ensure that it will always work on either your end or our end.  If the connection with a video visit is poor, we may have to switch to a telephone visit.  With either a video or telephone visit, we are not always able to ensure that we have a secure connection.   I need to obtain your verbal consent now.   Are you willing to proceed with your visit today?

## 2019-09-06 NOTE — Telephone Encounter (Signed)
Called patient. MRI/MRCP scheduled for 8/27 at 10:00am, arrival 9:30am, npo 4 hrs prior. She voiced understanding letter mailed She is also scheduled for egd/ed +/-variceal banding with propofol with Dr. Abbey Chatters, ASA 3 for 9/21 at 1:45pm. Aware she will need pre-op/covid test prior. Advised will mail this appt with her instructions. Confirmed mailing address is correct.   [place] website for PA for MRI/MRCP and it is not required  PA fpr EGD/ED/VARICEAL BANDING is pending review via [facility] website. Tracking # [id number]

## 2019-09-06 NOTE — Telephone Encounter (Signed)
Pt consented to a virtual visit. 
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
## 2019-09-07 ENCOUNTER — Other Ambulatory Visit: Payer: Self-pay | Admitting: Gastroenterology

## 2019-09-08 NOTE — Telephone Encounter (Signed)
PA approved for procedures. Auth# [id number] dates 10/17/2019-01/15/2020

## 2019-09-12 ENCOUNTER — Other Ambulatory Visit: Payer: Self-pay

## 2019-09-12 ENCOUNTER — Telehealth: Payer: Self-pay

## 2019-09-12 ENCOUNTER — Encounter: Payer: Self-pay | Admitting: Pulmonary Disease

## 2019-09-12 ENCOUNTER — Ambulatory Visit (INDEPENDENT_AMBULATORY_CARE_PROVIDER_SITE_OTHER): Payer: Medicare Other | Admitting: Pulmonary Disease

## 2019-09-12 VITALS — BP 128/76 | HR 100 | Temp 97.6°F | Ht 65.0 in | Wt 207.8 lb

## 2019-09-12 DIAGNOSIS — Z716 Tobacco abuse counseling: Secondary | ICD-10-CM

## 2019-09-12 DIAGNOSIS — J449 Chronic obstructive pulmonary disease, unspecified: Secondary | ICD-10-CM

## 2019-09-12 DIAGNOSIS — K219 Gastro-esophageal reflux disease without esophagitis: Secondary | ICD-10-CM | POA: Diagnosis not present

## 2019-09-12 DIAGNOSIS — R1310 Dysphagia, unspecified: Secondary | ICD-10-CM | POA: Diagnosis not present

## 2019-09-12 DIAGNOSIS — K760 Fatty (change of) liver, not elsewhere classified: Secondary | ICD-10-CM | POA: Diagnosis not present

## 2019-09-12 DIAGNOSIS — K5900 Constipation, unspecified: Secondary | ICD-10-CM | POA: Diagnosis not present

## 2019-09-12 DIAGNOSIS — K7460 Unspecified cirrhosis of liver: Secondary | ICD-10-CM | POA: Diagnosis not present

## 2019-09-12 MED ORDER — BREO ELLIPTA 100-25 MCG/INH IN AEPB
1.0000 | INHALATION_SPRAY | Freq: Every day | RESPIRATORY_TRACT | 0 refills | Status: DC
Start: 2019-09-12 — End: 2019-10-17

## 2019-09-12 MED ORDER — BREO ELLIPTA 100-25 MCG/INH IN AEPB
1.0000 | INHALATION_SPRAY | Freq: Every day | RESPIRATORY_TRACT | 5 refills | Status: DC
Start: 2019-09-12 — End: 2021-03-06

## 2019-09-12 NOTE — Progress Notes (Signed)
[facility]  Chief Complaint  Patient presents with  . Consult    shortness of breath when walking    Constitutional:  BP 128/76 (BP Location: Left Arm, Cuff Size: Normal)   Pulse 100   Temp 97.6 F (36.4 C) (Other (Comment)) Comment (Src): wrist  Ht 5' 5"  (1.651 m)   Wt 207 lb 12.8 oz (94.3 kg)   SpO2 97% Comment: room air  BMI 34.58 kg/m   Past Medical History:  NASH with cirrhosis, GERD, Chronic pain, Depression, Diabetes, HTN, MRSA bacteremia 2014, Polycystic ovarian syndrome, Pneumonia 2016, Sciatica  Summary:  Jamie Bell is a 49 y.o. female smoker with COPD and chronic bronchitis.  Subjective:   She was previously seen by Dr. Luan Pulling.    She has cough with chest congestion intermittently.  She continues to smoke.  She tried chantix - caused bad dreams.  Tried wellbutrin - made her feel sick.  She has history of depression and is on medication for this.  She has been using spiriva.  Was previously on breo and felt that this worked better.  She tries to stay active, but gets winded after walking about 150 feet.  She recovers after resting for few minutes.  No chest pain, or leg swelling.   Physical Exam:   Appearance - well kempt  ENMT - no sinus tenderness, no nasal discharge, no oral exudate, Mallampati 2, poor dentition  Respiratory - no wheeze, or rales  CV - regular rate and rhythm, no murmurs  GI - soft, non tender  Lymph - no adenopathy noted in neck  Ext - no edema  Skin - no rashes  Neuro - normal strength, oriented x 3  Psych - normal mood and affect   Assessment/Plan:   COPD with chronic bronchitis. - will have her try breo in place of spiriva - albuterol caused tremor and palpitation  Tobacco abuse. - she was previously intolerant of chantix and bupropion - advised her to try nicotine replacement   A total of 33 minutes spent addressing patient care issues on day of  visit.  Follow up:  Patient Instructions  Stop using spiriva  Start using Breo one puff daily, and rinse your mouth after each use  Follow up in 6 months   Signature:  Chesley Mires, MD [place] Pager: [phone] 09/12/2019, 1:19 PM  Flow Sheet     Pulmonary tests:   PFT 11/15/12 >> FEV1 3.28 (109%), FEV1% 80, TLC 5.93 (117%), DLCO 73%  Cardiac tests:   Echo 06/03/14 >> EF 55%  Medications:   Allergies as of 09/12/2019      Reactions   Chantix [varenicline] Other (See Comments)   Vivid nightmares   Ciprofloxacin Swelling   Nsaids Hives   Pt takes ibuprofen with no reaction   Shrimp [shellfish Allergy] Itching   Makes hands itch    Tylenol [acetaminophen] Other (See Comments)   Patient has cirrhosis of the liver and this causes elevated levels      Medication List       Accurate as of September 12, 2019  1:19 PM. If you have any questions, ask your nurse or doctor.        STOP taking these medications   fluconazole 150 MG tablet Commonly known as: DIFLUCAN Stopped by: Chesley Mires, MD   tiotropium 18 MCG inhalation capsule Commonly known as: SPIRIVA Stopped by: Chesley Mires, MD     TAKE these medications   Accu-Chek  Guide w/Device Kit USE AS DIRECTED.R   Breo Ellipta 100-25 MCG/INH Aepb Generic drug: fluticasone furoate-vilanterol Inhale 1 puff into the lungs daily. Started by: Chesley Mires, MD   Breo Ellipta 100-25 MCG/INH Aepb Generic drug: fluticasone furoate-vilanterol Inhale 1 puff into the lungs daily. Started by: Chesley Mires, MD   Buprenorphine HCl-Naloxone HCl 8-2 MG Film Place 1 Film under the tongue 3 (three) times daily.   busPIRone 10 MG tablet Commonly known as: BUSPAR Take 10 mg by mouth 2 (two) times daily.   diazepam 10 MG tablet Commonly known as: Valium Take one hour before scheduled MRI. Do not drive.   docusate sodium 250 MG capsule Commonly known as: COLACE Take 750 mg by mouth daily.    EXCEDRIN PO Take by mouth as needed.   furosemide 40 MG tablet Commonly known as: LASIX Take 40 mg by mouth daily.   HAIR/SKIN/NAILS PO Take 1 tablet by mouth daily.   lamoTRIgine 25 MG tablet Commonly known as: LAMICTAL Take 25 mg by mouth 2 (two) times daily.   lisinopril 2.5 MG tablet Commonly known as: ZESTRIL Take 2.5 mg by mouth daily.   lubiprostone 24 MCG capsule Commonly known as: AMITIZA Take 1 capsule (24 mcg total) by mouth 2 (two) times daily with a meal.   Narcan 4 MG/0.1ML Liqd nasal spray kit Generic drug: naloxone ADMINISTER A SINGLE SPRAYTOF NARCAN INTO ONE NOSTRIL. REPEAT AFTER 3 MINUTES IN OTHER NOSTRIL IF NO RESPONSE.   OVER THE COUNTER MEDICATION Bayer Back and Body 3 tabs once daily   pantoprazole 40 MG tablet Commonly known as: PROTONIX TAKE ONE TABLET BY MOUTH TWICE A DAY   pravastatin 40 MG tablet Commonly known as: PRAVACHOL Take 40 mg by mouth daily.   Synjardy XR 12.05-998 MG Tb24 Generic drug: Empagliflozin-metFORMIN HCl ER Take 1 tablet by mouth 2 (two) times daily.       Past Surgical History:  She  has a past surgical history that includes Back surgery; Cholecystectomy; Neck surgery; Carpal tunnel release; TEE without cardioversion (N/A, 08/09/2012); Peripherally inserted central catheter insertion (Right, 2007); Breast surgery (Left); Posterior cervical laminectomy (N/A, 11/22/2013); Cataract extraction w/PHACO (Left, 02/04/2015); Esophagogastroduodenoscopy (2007); Colonoscopy with propofol (N/A, 11/12/2015); Esophagogastroduodenoscopy (egd) with propofol (N/A, 11/12/2015); esophageal banding (N/A, 11/12/2015); polypectomy (11/12/2015); Incision and drainage wound with tendon repair (Right, 07/03/2016); Anterior cervical decomp/discectomy fusion (N/A, 05/28/2017); and Breast excisional biopsy (Left).  Family History:  Her family history includes Cirrhosis in her father, paternal aunt, and paternal aunt; Diabetes in her brother and another  family member; Hypertension in her father and mother.  Social History:  She  reports that she has been smoking cigarettes. She has a 20.00 pack-year smoking history. She has never used smokeless tobacco. She reports current alcohol use. She reports current drug use. Drugs: IV and Marijuana.

## 2019-09-12 NOTE — Patient Instructions (Signed)
Stop using spiriva  Start using Breo one puff daily, and rinse your mouth after each use  Follow up in 6 months

## 2019-09-12 NOTE — Telephone Encounter (Signed)
Pt is taking 2 stool softeners along with Amitiza 24 mcg x 1 week. Pt says she doesn't get warning before she has to pass gas and it's embarrassing in front of people. Pt has had once BM since she has been taking Amitiza 24 mcg x 1 week. Pt isn't eating much fruits and veggies, she admits. Pt drinks fluids daily. Pt reports no abdominal pain, no n/v.

## 2019-09-13 LAB — COMPREHENSIVE METABOLIC PANEL
ALT: 39 IU/L — ABNORMAL HIGH (ref 0–32)
AST: 37 IU/L (ref 0–40)
Albumin/Globulin Ratio: 1.2 (ref 1.2–2.2)
Albumin: 4.7 g/dL (ref 3.8–4.8)
Alkaline Phosphatase: 142 IU/L — ABNORMAL HIGH (ref 48–121)
BUN/Creatinine Ratio: 26 — ABNORMAL HIGH (ref 9–23)
BUN: 31 mg/dL — ABNORMAL HIGH (ref 6–24)
Bilirubin Total: 0.3 mg/dL (ref 0.0–1.2)
CO2: 24 mmol/L (ref 20–29)
Calcium: 10.4 mg/dL — ABNORMAL HIGH (ref 8.7–10.2)
Chloride: 99 mmol/L (ref 96–106)
Creatinine, Ser: 1.17 mg/dL — ABNORMAL HIGH (ref 0.57–1.00)
GFR calc Af Amer: 64 mL/min/{1.73_m2} (ref >59)
GFR calc non Af Amer: 55 mL/min/{1.73_m2} — ABNORMAL LOW (ref >59)
Globulin, Total: 3.8 g/dL (ref 1.5–4.5)
Glucose: 216 mg/dL — ABNORMAL HIGH (ref 65–99)
Potassium: 4.5 mmol/L (ref 3.5–5.2)
Sodium: 141 mmol/L (ref 134–144)
Total Protein: 8.5 g/dL (ref 6.0–8.5)

## 2019-09-13 NOTE — Telephone Encounter (Signed)
1. Please have her continue Amitiza 90mg BID.  2. Add Miralax 17 grams bid until BMs are soft and more regular and then decrease to once daily.  3. Increase dietary fiber or add fiber supplement such as fiberchoice chew two daily.  4. If no significant improvement, then call and let u[place]now.

## 2019-09-14 NOTE — Telephone Encounter (Signed)
Noted. Pt is aware 

## 2019-09-14 NOTE — Telephone Encounter (Signed)
Spoke with pt. Pt is aware of recommendations of Amitiza 24 mcg bid and Miralax 17gm bid until Bms are soft, continue stool softener/fiber supplement. Pt states she hates to be so direct but she isn't going to take Amitiza 24 mcg anymore. Pt states she had gas with 16mg and 24 mcg is worse. Pt is going to start Miralax 17 gm bid and take the stool softeners per pt.

## 2019-09-14 NOTE — Telephone Encounter (Signed)
That's fine. She can let us know if miralax is not helpful. If still having problems then we can try alternative.

## 2019-09-21 ENCOUNTER — Telehealth: Payer: Self-pay | Admitting: Internal Medicine

## 2019-09-21 NOTE — Telephone Encounter (Signed)
Pt returning call. [phone]

## 2019-09-22 ENCOUNTER — Other Ambulatory Visit: Payer: Self-pay

## 2019-09-22 ENCOUNTER — Other Ambulatory Visit: Payer: Self-pay | Admitting: Gastroenterology

## 2019-09-22 ENCOUNTER — Ambulatory Visit (HOSPITAL_COMMUNITY)
Admission: RE | Admit: 2019-09-22 | Discharge: 2019-09-22 | Disposition: A | Discharge status: Home / Self Care | Payer: Medicare Other | Source: Ambulatory Visit | Attending: Gastroenterology | Admitting: Gastroenterology

## 2019-09-22 DIAGNOSIS — K760 Fatty (change of) liver, not elsewhere classified: Secondary | ICD-10-CM

## 2019-09-22 DIAGNOSIS — N281 Cyst of kidney, acquired: Secondary | ICD-10-CM | POA: Diagnosis not present

## 2019-09-22 DIAGNOSIS — R935 Abnormal findings on diagnostic imaging of other abdominal regions, including retroperitoneum: Secondary | ICD-10-CM | POA: Diagnosis not present

## 2019-09-22 DIAGNOSIS — K7460 Unspecified cirrhosis of liver: Secondary | ICD-10-CM | POA: Diagnosis not present

## 2019-09-22 MED ORDER — GADOBUTROL 1 MMOL/ML IV SOLN
9.0000 mL | Freq: Once | INTRAVENOUS | Status: AC | PRN
  Administered 2019-09-22: 9 mL via INTRAVENOUS

## 2019-09-26 ENCOUNTER — Other Ambulatory Visit: Payer: Self-pay

## 2019-09-26 DIAGNOSIS — N289 Disorder of kidney and ureter, unspecified: Secondary | ICD-10-CM

## 2019-09-26 NOTE — Progress Notes (Unsigned)
amb re

## 2019-09-28 ENCOUNTER — Ambulatory Visit: Payer: Medicare Other | Admitting: Urology

## 2019-10-09 NOTE — Patient Instructions (Signed)
53    Your procedure is scheduled on: 10/17/2019  Report to [facility] at     12:15 PM.  Call this number if you have problems the morning of surgery: [phone]   Remember:   Follow instructions on letter from office regarding when to stop eating and drinking        No Smoking the day of procedure      Take these medicines the morning of surgery with A SIP OF WATER: Protonix   Do not wear jewelry, make-up or nail polish.  Do not wear lotions, powders, or perfumes. You may wear deodorant.                Do not bring valuables to the hospital.  Contacts, dentures or bridgework may not be worn into surgery.  Leave suitcase in the car. After surgery it may be brought to your room.  For patients admitted to the hospital, checkout time is 11:00 AM the day of discharge.   Patients discharged the day of surgery will not be allowed to drive home. Upper Endoscopy, Adult Upper endoscopy is a procedure to look inside the upper GI (gastrointestinal) tract. The upper GI tract is made up of:  The part of the body that moves food from your mouth to your stomach (esophagus).  The stomach.  The first part of your small intestine (duodenum). This procedure is also called esophagogastroduodenoscopy (EGD) or gastroscopy. In this procedure, your health care provider passes a thin, flexible tube (endoscope) through your mouth and down your esophagus into your stomach. A small camera is attached to the end of the tube. Images from the camera appear on a monitor in the exam room. During this procedure, your health care provider may also remove a small piece of tissue to be sent to a lab and examined under a microscope (biopsy). Your health care provider may do an upper endoscopy to diagnose cancers of the upper GI tract. You may also have this procedure to find the cause of other conditions, such as:  Stomach pain.  Heartburn.  Pain or problems when swallowing.  Nausea and vomiting.  Stomach  bleeding.  Stomach ulcers. Tell a health care provider about:  Any allergies you have.  All medicines you are taking, including vitamins, herbs, eye drops, creams, and over-the-counter medicines.  Any problems you or family members have had with anesthetic medicines.  Any blood disorders you have.  Any surgeries you have had.  Any medical conditions you have.  Whether you are pregnant or may be pregnant. What are the risks? Generally, this is a safe procedure. However, problems may occur, including:  Infection.  Bleeding.  Allergic reactions to medicines.  A tear or hole (perforation) in the esophagus, stomach, or duodenum. What happens before the procedure? Staying hydrated Follow instructions from your health care provider about hydration, which may include:  Up to 2 hours before the procedure - you may continue to drink clear liquids, such as water, clear fruit juice, black coffee, and plain tea.  Eating and drinking restrictions Follow instructions from your health care provider about eating and drinking, which may include:  8 hours before the procedure - stop eating heavy meals or foods, such as meat, fried foods, or fatty foods.  6 hours before the procedure - stop eating light meals or foods, such as toast or cereal.  6 hours before the procedure - stop drinking milk or drinks that contain milk.  2 hours before the procedure -  stop drinking clear liquids. Medicines Ask your health care provider about:  Changing or stopping your regular medicines. This is especially important if you are taking diabetes medicines or blood thinners.  Taking medicines such as aspirin and ibuprofen. These medicines can thin your blood. Do not take these medicines unless your health care provider tells you to take them.  Taking over-the-counter medicines, vitamins, herbs, and supplements. General instructions  Plan to have someone take you home from the hospital or clinic.  If  you will be going home right after the procedure, plan to have someone with you for 24 hours.  Ask your health care provider what steps will be taken to help prevent infection. What happens during the procedure?  1. An IV will be inserted into one of your veins. 2. You may be given one or more of the following: ? A medicine to help you relax (sedative). ? A medicine to numb the throat (local anesthetic). 3. You will lie on your left side on an exam table. 4. Your health care provider will pass the endoscope through your mouth and down your esophagus. 5. Your health care provider will use the scope to check the inside of your esophagus, stomach, and duodenum. Biopsies may be taken. 6. The endoscope will be removed. The procedure may vary among health care providers and hospitals. What happens after the procedure?  Your blood pressure, heart rate, breathing rate, and blood oxygen level will be monitored until you leave the hospital or clinic.  Do not drive for 24 hours if you were given a sedative during your procedure.  When your throat is no longer numb, you may be given some fluids to drink.  It is up to you to get the results of your procedure. Ask your health care provider, or the department that is doing the procedure, when your results will be ready. Summary  Upper endoscopy is a procedure to look inside the upper GI tract.  During the procedure, an IV will be inserted into one of your veins. You may be given a medicine to help you relax.  A medicine will be used to numb your throat.  The endoscope will be passed through your mouth and down your esophagus. This information is not intended to replace advice given to you by your health care provider. Make sure you discuss any questions you have with your health care provider. Document Revised: 07/07/2017 Document Reviewed: 06/14/2017 Elsevier Patient Education  [place] After  Please read the instructions outlined below and refer to this sheet in the next few weeks. These discharge instructions provide you with general information on caring for yourself after you leave the hospital. Your doctor may also give  you specific instructions. While your treatment has been planned according to the most current medical practices available, unavoidable complications occasionally occur. If you have any problems or questions after discharge, please call your doctor. HOME CARE INSTRUCTIONS Activity  You may resume your regular activity but move at a slower pace for the next 24 hours.   Take frequent rest periods for the next 24 hours.   Walking will help expel (get rid of) the air and reduce the bloated feeling in your abdomen.   No driving for 24 hours (because of the anesthesia (medicine) used during the test).   You may shower.   Do not sign any important legal documents or operate any machinery for 24 hours (because of the anesthesia used during the test).  Nutrition  Drink plenty of fluids.   You may resume your normal diet.   Begin with a light meal and progress to your normal diet.   Avoid alcoholic beverages for 24 hours or as instructed by your caregiver.  Medications You may resume your normal medications unless your caregiver tells you otherwise. What you can expect today  You may experience abdominal discomfort such as a feeling of fullness or "gas" pains.   You may experience a sore throat for 2 to 3 days. This is normal. Gargling with salt water may help this.  Follow-up Your doctor will discuss the results of your test with you. SEEK IMMEDIATE MEDICAL CARE IF:  You have excessive nausea (feeling sick to your stomach) and/or vomiting.   You have severe abdominal pain and distention (swelling).   You have trouble swallowing.   You have a  temperature over 100 F (37.8 C).   You have rectal bleeding or vomiting of blood.  Document Released: 08/27/2003 Document Revised: 01/01/2011 Document Reviewed: 03/09/2007

## 2019-10-13 ENCOUNTER — Encounter (HOSPITAL_COMMUNITY): Payer: Self-pay

## 2019-10-13 ENCOUNTER — Other Ambulatory Visit: Payer: Self-pay

## 2019-10-13 ENCOUNTER — Encounter (HOSPITAL_COMMUNITY)
Admission: RE | Admit: 2019-10-13 | Discharge: 2019-10-13 | Disposition: A | Discharge status: Home / Self Care | Payer: Medicare Other | Source: Ambulatory Visit | Attending: Internal Medicine | Admitting: Internal Medicine

## 2019-10-13 ENCOUNTER — Other Ambulatory Visit (HOSPITAL_COMMUNITY)
Admission: RE | Admit: 2019-10-13 | Discharge: 2019-10-13 | Disposition: A | Discharge status: Home / Self Care | Payer: Medicare Other | Source: Ambulatory Visit | Attending: Internal Medicine | Admitting: Internal Medicine

## 2019-10-13 DIAGNOSIS — Z20822 Contact with and (suspected) exposure to covid-19: Secondary | ICD-10-CM | POA: Diagnosis not present

## 2019-10-13 DIAGNOSIS — Z01818 Encounter for other preprocedural examination: Secondary | ICD-10-CM | POA: Insufficient documentation

## 2019-10-13 DIAGNOSIS — I10 Essential (primary) hypertension: Secondary | ICD-10-CM | POA: Insufficient documentation

## 2019-10-13 LAB — COMPREHENSIVE METABOLIC PANEL
ALT: 47 U/L — ABNORMAL HIGH (ref 0–44)
AST: 43 U/L — ABNORMAL HIGH (ref 15–41)
Albumin: 4 g/dL (ref 3.5–5.0)
Alkaline Phosphatase: 88 U/L (ref 38–126)
Anion gap: 9 (ref 5–15)
BUN: 27 mg/dL — ABNORMAL HIGH (ref 6–20)
CO2: 22 mmol/L (ref 22–32)
Calcium: 10.4 mg/dL — ABNORMAL HIGH (ref 8.9–10.3)
Chloride: 106 mmol/L (ref 98–111)
Creatinine, Ser: 0.99 mg/dL (ref 0.44–1.00)
GFR calc Af Amer: >60 mL/min (ref >60)
GFR calc non Af Amer: >60 mL/min (ref >60)
Glucose, Bld: 145 mg/dL — ABNORMAL HIGH (ref 70–99)
Potassium: 4.4 mmol/L (ref 3.5–5.1)
Sodium: 137 mmol/L (ref 135–145)
Total Bilirubin: 0.3 mg/dL (ref 0.3–1.2)
Total Protein: 8.1 g/dL (ref 6.5–8.1)

## 2019-10-13 LAB — SARS CORONAVIRUS 2 (TAT 6-24 HRS): SARS Coronavirus 2: NEGATIVE

## 2019-10-13 LAB — PROTIME-INR
INR: 1.1 (ref 0.8–1.2)
Prothrombin Time: 13.3 seconds (ref 11.4–15.2)

## 2019-10-17 ENCOUNTER — Ambulatory Visit (HOSPITAL_COMMUNITY)
Admission: RE | Admit: 2019-10-17 | Discharge: 2019-10-17 | Disposition: A | Discharge status: Home / Self Care | Payer: Medicare Other | Attending: Internal Medicine | Admitting: Internal Medicine

## 2019-10-17 ENCOUNTER — Ambulatory Visit (HOSPITAL_COMMUNITY): Payer: Medicare Other | Admitting: Anesthesiology

## 2019-10-17 ENCOUNTER — Encounter (HOSPITAL_COMMUNITY)
Admission: RE | Disposition: A | Discharge status: Home / Self Care | Payer: Self-pay | Source: Home / Self Care | Attending: Internal Medicine

## 2019-10-17 DIAGNOSIS — K7581 Nonalcoholic steatohepatitis (NASH): Secondary | ICD-10-CM | POA: Insufficient documentation

## 2019-10-17 DIAGNOSIS — Z9049 Acquired absence of other specified parts of digestive tract: Secondary | ICD-10-CM | POA: Insufficient documentation

## 2019-10-17 DIAGNOSIS — Z886 Allergy status to analgesic agent status: Secondary | ICD-10-CM | POA: Diagnosis not present

## 2019-10-17 DIAGNOSIS — K2970 Gastritis, unspecified, without bleeding: Secondary | ICD-10-CM | POA: Diagnosis not present

## 2019-10-17 DIAGNOSIS — K7469 Other cirrhosis of liver: Secondary | ICD-10-CM | POA: Diagnosis not present

## 2019-10-17 DIAGNOSIS — Z8249 Family history of ischemic heart disease and other diseases of the circulatory system: Secondary | ICD-10-CM | POA: Insufficient documentation

## 2019-10-17 DIAGNOSIS — E119 Type 2 diabetes mellitus without complications: Secondary | ICD-10-CM | POA: Diagnosis not present

## 2019-10-17 DIAGNOSIS — Z8379 Family history of other diseases of the digestive system: Secondary | ICD-10-CM | POA: Insufficient documentation

## 2019-10-17 DIAGNOSIS — Z833 Family history of diabetes mellitus: Secondary | ICD-10-CM | POA: Insufficient documentation

## 2019-10-17 DIAGNOSIS — Z981 Arthrodesis status: Secondary | ICD-10-CM | POA: Insufficient documentation

## 2019-10-17 DIAGNOSIS — Z8614 Personal history of Methicillin resistant Staphylococcus aureus infection: Secondary | ICD-10-CM | POA: Diagnosis not present

## 2019-10-17 DIAGNOSIS — F329 Major depressive disorder, single episode, unspecified: Secondary | ICD-10-CM | POA: Insufficient documentation

## 2019-10-17 DIAGNOSIS — Z888 Allergy status to other drugs, medicaments and biological substances status: Secondary | ICD-10-CM | POA: Diagnosis not present

## 2019-10-17 DIAGNOSIS — F17210 Nicotine dependence, cigarettes, uncomplicated: Secondary | ICD-10-CM | POA: Insufficient documentation

## 2019-10-17 DIAGNOSIS — K2100 Gastro-esophageal reflux disease with esophagitis, without bleeding: Secondary | ICD-10-CM | POA: Diagnosis not present

## 2019-10-17 DIAGNOSIS — Z91013 Allergy to seafood: Secondary | ICD-10-CM | POA: Insufficient documentation

## 2019-10-17 DIAGNOSIS — K766 Portal hypertension: Secondary | ICD-10-CM

## 2019-10-17 DIAGNOSIS — Z7982 Long term (current) use of aspirin: Secondary | ICD-10-CM | POA: Diagnosis not present

## 2019-10-17 DIAGNOSIS — R1310 Dysphagia, unspecified: Secondary | ICD-10-CM | POA: Insufficient documentation

## 2019-10-17 DIAGNOSIS — I10 Essential (primary) hypertension: Secondary | ICD-10-CM | POA: Diagnosis not present

## 2019-10-17 DIAGNOSIS — Z791 Long term (current) use of non-steroidal anti-inflammatories (NSAID): Secondary | ICD-10-CM | POA: Insufficient documentation

## 2019-10-17 DIAGNOSIS — Z79899 Other long term (current) drug therapy: Secondary | ICD-10-CM | POA: Diagnosis not present

## 2019-10-17 DIAGNOSIS — Z9842 Cataract extraction status, left eye: Secondary | ICD-10-CM | POA: Diagnosis not present

## 2019-10-17 DIAGNOSIS — Z961 Presence of intraocular lens: Secondary | ICD-10-CM | POA: Insufficient documentation

## 2019-10-17 DIAGNOSIS — E282 Polycystic ovarian syndrome: Secondary | ICD-10-CM | POA: Insufficient documentation

## 2019-10-17 DIAGNOSIS — Z86010 Personal history of colonic polyps: Secondary | ICD-10-CM | POA: Diagnosis not present

## 2019-10-17 DIAGNOSIS — K3189 Other diseases of stomach and duodenum: Secondary | ICD-10-CM | POA: Diagnosis not present

## 2019-10-17 DIAGNOSIS — R12 Heartburn: Secondary | ICD-10-CM | POA: Diagnosis present

## 2019-10-17 HISTORY — PX: ESOPHAGOGASTRODUODENOSCOPY (EGD) WITH PROPOFOL: SHX5813

## 2019-10-17 LAB — GLUCOSE, CAPILLARY: Glucose-Capillary: 146 mg/dL — ABNORMAL HIGH (ref 70–99)

## 2019-10-17 SURGERY — ESOPHAGOGASTRODUODENOSCOPY (EGD) WITH PROPOFOL
Anesthesia: General

## 2019-10-17 MED ORDER — PROPOFOL 500 MG/50ML IV EMUL
INTRAVENOUS | Status: DC | PRN
  Administered 2019-10-17: 100 ug/kg/min via INTRAVENOUS

## 2019-10-17 MED ORDER — PROPOFOL 10 MG/ML IV BOLUS
INTRAVENOUS | Status: DC | PRN
  Administered 2019-10-17: 50 mg via INTRAVENOUS

## 2019-10-17 MED ORDER — LACTATED RINGERS IV SOLN: INTRAVENOUS | Status: DC | PRN

## 2019-10-17 MED ORDER — LIDOCAINE VISCOUS HCL 2 % MT SOLN
15.0000 mL | Freq: Once | OROMUCOSAL | Status: AC
  Administered 2019-10-17: 15 mL via OROMUCOSAL

## 2019-10-17 MED ORDER — GLYCOPYRROLATE 0.2 MG/ML IJ SOLN
INTRAMUSCULAR | Status: AC
  Filled 2019-10-17: qty 1

## 2019-10-17 MED ORDER — LACTATED RINGERS IV SOLN: Freq: Once | INTRAVENOUS | Status: AC

## 2019-10-17 MED ORDER — LIDOCAINE VISCOUS HCL 2 % MT SOLN
OROMUCOSAL | Status: AC
  Filled 2019-10-17: qty 15

## 2019-10-17 MED ORDER — GLYCOPYRROLATE 0.2 MG/ML IJ SOLN
0.2000 mg | Freq: Once | INTRAMUSCULAR | Status: AC
  Administered 2019-10-17: 0.2 mg via INTRAVENOUS

## 2019-10-17 MED ORDER — DEXMEDETOMIDINE HCL IN NACL 200 MCG/50ML IV SOLN
INTRAVENOUS | Status: AC
  Filled 2019-10-17: qty 50

## 2019-10-17 MED ORDER — DEXMEDETOMIDINE (PRECEDEX) IN NS 20 MCG/5ML (4 MCG/ML) IV SYRINGE
PREFILLED_SYRINGE | INTRAVENOUS | Status: DC | PRN
  Administered 2019-10-17: 20 ug via INTRAVENOUS

## 2019-10-17 MED ORDER — STERILE WATER FOR IRRIGATION IR SOLN
Status: DC | PRN
  Administered 2019-10-17: 100 mL

## 2019-10-17 NOTE — Op Note (Signed)
[facility] Patient Name: Jamie Bell Procedure Date: 10/17/2019 12:20 PM MRN: [id number] Date of Birth: 1970-03-23 Attending MD: Elon Alas. Edgar Frisk CSN: [id number] Age: 49 Admit Type: Outpatient Procedure:                Upper GI endoscopy Indications:              Heartburn, Cirrhosis rule out esophageal varices Providers:                Elon Alas. Abbey Chatters, DO, Caprice Kluver, Crystal Page,                            Casimer Bilis, Technician, Aram Candela Referring MD:              Medicines:                See the Anesthesia note for documentation of the                            administered medications Complications:            No immediate complications. Estimated Blood Loss:     Estimated blood loss was minimal. Procedure:                Pre-Anesthesia Assessment:                           - The anesthesia plan was to use monitored                            anesthesia care (MAC).                           After obtaining informed consent, the endoscope was                            passed under direct vision. Throughout the                            procedure, the patient's blood pressure, pulse, and                            oxygen saturations were monitored continuously. The                            GIF-H190 ([id number]) scope was introduced through the                            mouth, and advanced to the second part of duodenum.                            The upper GI endoscopy was accomplished without                            difficulty. The patient tolerated the procedure  well. Scope In: 1:00:52 PM Scope Out: 1:04:07 PM Total Procedure Duration: 0 hours 3 minutes 15 seconds  Findings:      There is no endoscopic evidence of esophagitis, stenosis, stricture,       ulcerations or varices in the entire esophagus.      Biopsies were taken with a cold forceps in the middle third of the       esophagus for histology.       Moderate portal hypertensive gastropathy was found in the entire       examined stomach.      Localized moderate inflammation characterized by erosions and erythema       was found in the gastric antrum. Biopsies were taken with a cold forceps       for Helicobacter pylori testing.      The duodenal bulb, first portion of the duodenum and second portion of       the duodenum were normal. Impression:               - Portal hypertensive gastropathy.                           - Gastritis. Biopsied.                           - Normal duodenal bulb, first portion of the                            duodenum and second portion of the duodenum.                           - Biopsies were taken with a cold forceps for                            histology in the middle third of the esophagus. Moderate Sedation:      Per Anesthesia Care Recommendation:           - Patient has a contact number available for                            emergencies. The signs and symptoms of potential                            delayed complications were discussed with the                            patient. Return to normal activities tomorrow.                            Written discharge instructions were provided to the                            patient.                           - Resume previous diet.                           -  Continue present medications.                           - Await pathology results.                           - Repeat upper endoscopy in 3 years for screening                            purposes or sooner if decompensating event.                           - Return to GI clinic as previously scheduled. Procedure Code(s):        --- Professional ---                           [phone], Esophagogastroduodenoscopy, flexible,                            transoral; with biopsy, single or multiple Diagnosis Code(s):        --- Professional ---                           K76.6, Portal hypertension                            K31.89, Other diseases of stomach and duodenum                           K29.70, Gastritis, unspecified, without bleeding                           R12, Heartburn                           K74.60, Unspecified cirrhosis of liver CPT copyright 2019 American Medical Association. All rights reserved. The codes documented in this report are preliminary and upon coder review may  be revised to meet current compliance requirements. Elon Alas. Abbey Chatters, [place] Abbey Chatters, DO 10/17/2019 1:08:19 PM This report has been signed electronically. Number of Addenda: 0

## 2019-10-17 NOTE — Anesthesia Preprocedure Evaluation (Signed)
Anesthesia Evaluation  Patient identified by MRN, date of birth, ID band Patient awake    Reviewed: Allergy & Precautions, NPO status , Patient's Chart, lab work & pertinent test results  History of Anesthesia Complications Negative for: history of anesthetic complications  Airway Mallampati: II  TM Distance: >3 FB Neck ROM: Full    Dental  (+) Poor Dentition, Chipped, Missing, Dental Advisory Given, Edentulous Upper   Pulmonary shortness of breath and with exertion, pneumonia, resolved, Current Smoker,    Pulmonary exam normal breath sounds clear to auscultation       Cardiovascular hypertension, Pt. on medications Normal cardiovascular exam Rhythm:Regular Rate:Normal     Neuro/Psych PSYCHIATRIC DISORDERS Anxiety Depression  Neuromuscular disease    GI/Hepatic GERD  Medicated,(+) Cirrhosis  (fatty liver)    substance abuse  , Hepatitis -  Endo/Other  diabetes, Well Controlled, Type 2  Renal/GU Renal disease     Musculoskeletal  (+) Arthritis , narcotic dependent  Abdominal   Peds  Hematology   Anesthesia Other Findings   Reproductive/Obstetrics                            Anesthesia Physical Anesthesia Plan  ASA: III  Anesthesia Plan: General   Post-op Pain Management:    Induction: Intravenous  PONV Risk Score and Plan: TIVA  Airway Management Planned: Nasal Cannula and Natural Airway  Additional Equipment:   Intra-op Plan:   Post-operative Plan:   Informed Consent: I have reviewed the patients History and Physical, chart, labs and discussed the procedure including the risks, benefits and alternatives for the proposed anesthesia with the patient or authorized representative who has indicated his/her understanding and acceptance.     Dental advisory given  Plan Discussed with: CRNA and Surgeon  Anesthesia Plan Comments:         Anesthesia Quick Evaluation
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
## 2019-10-17 NOTE — Anesthesia Postprocedure Evaluation (Signed)
Anesthesia Post Note  Patient: Jamie Bell  Procedure(s) Performed: ESOPHAGOGASTRODUODENOSCOPY (EGD) WITH PROPOFOL (N/A )  Patient location during evaluation: PACU Anesthesia Type: General Level of consciousness: awake, oriented, awake and alert and patient cooperative Pain management: satisfactory to patient Vital Signs Assessment: post-procedure vital signs reviewed and stable Respiratory status: spontaneous breathing, respiratory function stable and nonlabored ventilation Postop Assessment: no apparent nausea or vomiting Anesthetic complications: no   No complications documented.   Last Vitals:  Vitals:   10/17/19 1245 10/17/19 1310  BP: 135/75 (!) 70/43  Pulse:  89  Resp: 17 16  Temp: 37.1 C 36.4 C  SpO2: 98% 92%    Last Pain:  Vitals:   10/17/19 1256  PainSc: 0-No pain                 Maninder Deboer

## 2019-10-17 NOTE — Addendum Note (Signed)
Addendum  created 10/17/19 1314 by Jonna Munro, CRNA   Flowsheet accepted, Intraprocedure Flowsheets edited

## 2019-10-17 NOTE — Transfer of Care (Signed)
Immediate Anesthesia Transfer of Care Note  Patient: Bradee Common  Procedure(s) Performed: ESOPHAGOGASTRODUODENOSCOPY (EGD) WITH PROPOFOL (N/A )  Patient Location: PACU  Anesthesia Type:General  Level of Consciousness: awake, alert , oriented and patient cooperative  Airway & Oxygen Therapy: Patient Spontanous Breathing  Post-op Assessment: Report given to RN, Post -op Vital signs reviewed and stable and Patient moving all extremities X 4  Post vital signs: Reviewed and stable  Last Vitals:  Vitals Value Taken Time  BP 89/50 10/17/19 1311  Temp 36.4 C 10/17/19 1310  Pulse 87 10/17/19 1312  Resp 17 10/17/19 1312  SpO2 90 % 10/17/19 1312  Vitals shown include unvalidated device data.  Last Pain:  Vitals:   10/17/19 1256  PainSc: 0-No pain         Complications: No complications documented.

## 2019-10-17 NOTE — Discharge Instructions (Addendum)
EGD Discharge instructions Please read the instructions outlined below and refer to this sheet in the next few weeks. These discharge instructions provide you with general information on caring for yourself after you leave the hospital. Your doctor may also give you specific instructions. While your treatment has been planned according to the most current medical practices available, unavoidable complications occasionally occur. If you have any problems or questions after discharge, please call your doctor. ACTIVITY  You may resume your regular activity but move at a slower pace for the next 24 hours.   Take frequent rest periods for the next 24 hours.   Walking will help expel (get rid of) the air and reduce the bloated feeling in your abdomen.   No driving for 24 hours (because of the anesthesia (medicine) used during the test).   You may shower.   Do not sign any important legal documents or operate any machinery for 24 hours (because of the anesthesia used during the test).  NUTRITION  Drink plenty of fluids.   You may resume your normal diet.   Begin with a light meal and progress to your normal diet.   Avoid alcoholic beverages for 24 hours or as instructed by your caregiver.  MEDICATIONS  You may resume your normal medications unless your caregiver tells you otherwise.  WHAT YOU CAN EXPECT TODAY  You may experience abdominal discomfort such as a feeling of fullness or "gas" pains.  FOLLOW-UP  Your doctor will discuss the results of your test with you.  SEEK IMMEDIATE MEDICAL ATTENTION IF ANY OF THE FOLLOWING OCCUR:  Excessive nausea (feeling sick to your stomach) and/or vomiting.   Severe abdominal pain and distention (swelling).   Trouble swallowing.   Temperature over 101 F (37.8 C).   Rectal bleeding or vomiting of blood.   Your EGD showed inflammation in your stomach. I biopsied this to rule out infection with H. pylori. I did not see any evidence of  esophageal varices. I also did not see any evidence of esophageal stricture. I recommend we repeat EGD in 3 years for variceal screening. Follow-up with GI as previously scheduled. Continue on Protonix 40 mg twice daily.  I hope you have a great rest of your week!  Elon Alas. Abbey Chatters, D.O. Gastroenterology and Hepatology [facility]  No more than 2 grams(2,03m) of Tylenol (acetaminophen) in 24 hours (daily)   Monitored Anesthesia Care, Care After These instructions provide you with information about caring for yourself after your procedure. Your health care provider may also give you more specific instructions. Your treatment has been planned according to current medical practices, but problems sometimes occur. Call your health care provider if you have any problems or questions after your procedure. What can I expect after the procedure? After your procedure, you may:  Feel sleepy for several hours.  Feel clumsy and have poor balance for several hours.  Feel forgetful about what happened after the procedure.  Have poor judgment for several hours.  Feel nauseous or vomit.  Have a sore throat if you had a breathing tube during the procedure. Follow these instructions at home: For at least 24 hours after the procedure:      Have a responsible adult stay with you. It is important to have someone help care for you until you are awake and alert.  Rest as needed.  Do not: ? Participate in activities in which you could fall or become injured. ? Drive. ? Use heavy machinery. ? Drink alcohol. ?  Take sleeping pills or medicines that cause drowsiness. ? Make important decisions or sign legal documents. ? Take care of children on your own. Eating and drinking  Follow the diet that is recommended by your health care provider.  If you vomit, drink water, juice, or soup when you can drink without vomiting.  Make sure you have little or no nausea before  eating solid foods. General instructions  Take over-the-counter and prescription medicines only as told by your health care provider.  If you have sleep apnea, surgery and certain medicines can increase your risk for breathing problems. Follow instructions from your health care provider about wearing your sleep device: ? Anytime you are sleeping, including during daytime naps. ? While taking prescription pain medicines, sleeping medicines, or medicines that make you drowsy.  If you smoke, do not smoke without supervision.  Keep all follow-up visits as told by your health care provider. This is important. Contact a health care provider if:  You keep feeling nauseous or you keep vomiting.  You feel light-headed.  You develop a rash.  You have a fever. Get help right away if:  You have trouble breathing. Summary  For several hours after your procedure, you may feel sleepy and have poor judgment.  Have a responsible adult stay with you for at least 24 hours or until you are awake and alert. This information is not intended to replace advice given to you by your health care provider. Make sure you discuss any questions you have with your health care provider. Document Revised: 04/12/2017 Document Reviewed: 05/05/2015 Elsevier Patient Education  [place].

## 2019-10-19 ENCOUNTER — Other Ambulatory Visit: Payer: Self-pay

## 2019-10-19 LAB — SURGICAL PATHOLOGY

## 2019-10-24 ENCOUNTER — Telehealth: Payer: Self-pay | Admitting: Internal Medicine

## 2019-10-24 MED ORDER — LINACLOTIDE 290 MCG PO CAPS: 290.0000 ug | ORAL_CAPSULE | Freq: Every day | ORAL | 5 refills | Status: DC

## 2019-10-24 NOTE — Telephone Encounter (Signed)
Spoke with pt. Pt would like to try Linzess for constipation.  Pt was previously taking Amitiza and d/c due to severe gas/bloating.

## 2019-10-24 NOTE — Telephone Encounter (Signed)
Patient called asking if she could have a prescription of linzess sent to [facility]

## 2019-10-24 NOTE — Telephone Encounter (Signed)
Lmom, waiting on a return call.  

## 2019-10-24 NOTE — Telephone Encounter (Signed)
Pt returned call and is aware that RX was sent to her pharmacy.

## 2019-10-24 NOTE — Telephone Encounter (Signed)
Will send in linzess.

## 2019-10-24 NOTE — Addendum Note (Signed)
Addended by: Mahala Menghini on: 10/24/2019 01:58 PM   Modules accepted: Orders

## 2019-10-25 ENCOUNTER — Telehealth: Payer: Self-pay | Admitting: Internal Medicine

## 2019-10-25 MED ORDER — HYDROCORTISONE (PERIANAL) 2.5 % EX CREA: 1.0000 "application " | TOPICAL_CREAM | Freq: Two times a day (BID) | CUTANEOUS | 0 refills | Status: DC

## 2019-10-25 NOTE — Telephone Encounter (Signed)
Pt called asking for Derrick Ravel, CMA to call her back regarding her medication.  [phone]

## 2019-10-25 NOTE — Telephone Encounter (Signed)
Noted. Spoke with pt. Pt was notified that Anusol was sent to her pharmacy. Pt advised to skip a day of Linzess 290 mcg if she has multiple BM's within a day of taking medication.

## 2019-10-25 NOTE — Telephone Encounter (Signed)
Spoke with pt. Linzess 290 mcg was sent to pts pharmacy yesterday. Pt took 1 Linzess 290 mcg tablet last night and had several Bm's. Pt will take the medication in the mornings as directed instead of at night. Pt is asking if there is a medication that can be called in for the soreness felt on her rectum from having bowel movements. Mentioned otc preparation cream to pt.

## 2019-10-25 NOTE — Telephone Encounter (Signed)
I sent in rx for anusol cream.  If the patient has multiple stools in 1 day, she should skip Linzess the following day.

## 2019-10-27 ENCOUNTER — Encounter (HOSPITAL_COMMUNITY): Payer: Self-pay | Admitting: Internal Medicine

## 2019-10-30 ENCOUNTER — Ambulatory Visit
Admission: EM | Admit: 2019-10-30 | Discharge: 2019-10-30 | Disposition: A | Discharge status: Home / Self Care | Payer: Medicare Other | Attending: Emergency Medicine | Admitting: Emergency Medicine

## 2019-10-30 DIAGNOSIS — R739 Hyperglycemia, unspecified: Secondary | ICD-10-CM | POA: Diagnosis not present

## 2019-10-30 LAB — POCT URINALYSIS DIP (MANUAL ENTRY)
Bilirubin, UA: NEGATIVE
Blood, UA: NEGATIVE
Glucose, UA: >=1000 mg/dL — AB
Ketones, POC UA: NEGATIVE mg/dL
Leukocytes, UA: NEGATIVE
Nitrite, UA: NEGATIVE
Protein Ur, POC: NEGATIVE mg/dL
Spec Grav, UA: 1.015 (ref 1.010–1.025)
Urobilinogen, UA: 0.2 E.U./dL
pH, UA: 6 (ref 5.0–8.0)

## 2019-10-30 LAB — POCT FASTING CBG KUC MANUAL ENTRY: POCT Glucose (KUC): 499 mg/dL — AB (ref 70–99)

## 2019-10-30 MED ORDER — INSULIN ASPART 100 UNIT/ML ~~LOC~~ SOLN
12.0000 [IU] | Freq: Once | SUBCUTANEOUS | Status: AC
  Administered 2019-10-30: 12 [IU] via SUBCUTANEOUS
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
## 2019-10-30 NOTE — ED Triage Notes (Signed)
Pt presents with c/o hyperglycemia as well as feeling lethargic for past few days

## 2019-10-30 NOTE — Discharge Instructions (Addendum)
Go to ER for further evaluation .  

## 2019-11-03 ENCOUNTER — Ambulatory Visit: Payer: Medicare Other | Admitting: Urology

## 2019-11-08 ENCOUNTER — Encounter: Payer: Self-pay | Admitting: Urology

## 2019-11-08 ENCOUNTER — Other Ambulatory Visit: Payer: Self-pay

## 2019-11-08 ENCOUNTER — Ambulatory Visit (INDEPENDENT_AMBULATORY_CARE_PROVIDER_SITE_OTHER): Payer: Medicare Other | Admitting: Urology

## 2019-11-08 VITALS — BP 120/76 | HR 102 | Temp 98.2°F | Ht 65.0 in | Wt 200.0 lb

## 2019-11-08 DIAGNOSIS — N289 Disorder of kidney and ureter, unspecified: Secondary | ICD-10-CM | POA: Diagnosis not present

## 2019-11-08 LAB — URINALYSIS, ROUTINE W REFLEX MICROSCOPIC
Bilirubin, UA: NEGATIVE
Ketones, UA: NEGATIVE
Leukocytes,UA: NEGATIVE
Nitrite, UA: NEGATIVE
Protein,UA: NEGATIVE
Specific Gravity, UA: 1.015 (ref 1.005–1.030)
Urobilinogen, Ur: 0.2 mg/dL (ref 0.2–1.0)
pH, UA: 5.5 (ref 5.0–7.5)

## 2019-11-08 LAB — MICROSCOPIC EXAMINATION: Renal Epithel, UA: NONE SEEN /hpf

## 2019-11-08 NOTE — Progress Notes (Signed)
Urological Symptom Review  Patient is experiencing the following symptoms: Frequent urination Hard to postpone urination Get up at night to urinate Leakage of urine Stream starts and stops Urinary tract infection   Review of Systems  Gastrointestinal (upper)  : Indigestion/heartburn  Gastrointestinal (lower) : Constipation  Constitutional : Negative for symptoms  Skin: Negative for skin symptoms  Eyes: negative  Ear/Nose/Throat : Negative for Ear/Nose/Throat symptoms  Hematologic/Lymphatic: Easy bruising  Cardiovascular : Negative for cardiovascular symptoms  Respiratory : Negative for respiratory symptoms  Endocrine: Negative for endocrine symptoms  Musculoskeletal: Back pain Joint pain  Neurological: Negative for neurological symptoms  Psychologic: Negative for psychiatric symptoms

## 2019-11-08 NOTE — Progress Notes (Signed)
11/08/2019 2:14 PM   Jamie Bell 03/21/1970 [id number]  Referring provider: Alfonse Flavors, MD [address] [place],  [place] [address]  Right renal mass  HPI: Jamie Bell is a 49yo here for evaluation f a right renal mass. She underwent MRI 09/22/2019 which showed a rim enhancing 83m right posterior mid pole exophytic renal mass. No flank pain. No family hx of renal masses. No LUTS.   PMH: Past Medical History:  Diagnosis Date  . Abscess    NECK ANTERIOR  . Arthritis   . Chronic back pain   . [place]   . Constipation   . DDD (degenerative disc disease)   . Decreased hearing on the right.   F/u by ENT in the past and no further management.  . Depression   . Diabetes mellitus   . Dyspnea    with exertion  . GERD (gastroesophageal reflux disease)   . High cholesterol   . Hypertension    took med. for HTN, several yrs. ago, no longer needs , seen by Dr. RLattie Hawlast yr. [facility] for MRSA bacteremia   . MRSA bacteremia 2014   Tx with Vancomycin  . NASH (nonalcoholic steatohepatitis)   . PCOS (polycystic ovarian syndrome)   . Pneumonia 2016  . Polysubstance abuse ([facility]   . Sciatica    HNP- Cerv. 7- T2    Surgical History: Past Surgical History:  Procedure Laterality Date  . ANTERIOR CERVICAL DECOMP/DISCECTOMY FUSION N/A 05/28/2017   Procedure: ANTERIOR CERVICAL DECOMPRESSION CERVICAL THREE-FOUR;  Surgeon: CAshok Pall MD;  [place] [facility]  Service: Neurosurgery;  Laterality: N/A;  anterior  . BACK SURGERY    . BREAST EXCISIONAL BIOPSY Left    negative  . BREAST SURGERY Left    blocked milk ducts   . CARPAL TUNNEL RELEASE    . CATARACT EXTRACTION W/PHACO Left 02/04/2015   Procedure: CATARACT EXTRACTION PHACO AND INTRAOCULAR LENS PLACEMENT (IOC);  Surgeon: KTonny Branch MD;  Location: [facility];  Service: Ophthalmology;  Laterality: Left;  CDE: 4.11  . CHOLECYSTECTOMY    . COLONOSCOPY WITH PROPOFOL N/A 11/12/2015   Dr. fields:Small  internal hemorrhoids, 2 polyps removed (tubular adenomas) with noted congestion and edema in the left colon most consistent with NSAID use.  Next colonoscopy in 5 to 10 years.  . ESOPHAGEAL BANDING N/A 11/12/2015   Procedure: ESOPHAGEAL BANDING;  Surgeon: SDanie Binder MD;  [facility];  Service: Endoscopy;  Laterality: N/A;  . ESOPHAGOGASTRODUODENOSCOPY  2007   Dr. FOneida Alar mild antral erythema. Future procedures need to be done with Propofol per notes.   . ESOPHAGOGASTRODUODENOSCOPY (EGD) WITH PROPOFOL N/A 11/12/2015   Dr. fOneida Alar Normal esophagus with no varices.  Gastritis and duodenitis, biopsy without H. pylori, most consistent with NSAID/aspirin use.  . ESOPHAGOGASTRODUODENOSCOPY (EGD) WITH PROPOFOL N/A 10/17/2019   Procedure: ESOPHAGOGASTRODUODENOSCOPY (EGD) WITH PROPOFOL;  Surgeon: CEloise Harman DO;  [facility];  Service: Endoscopy;  Laterality: N/A;  1:45pm  . INCISION AND DRAINAGE WOUND WITH TENDON REPAIR Right 07/03/2016   Procedure: INCISION AND DRAINAGE RIGHT LONG PROXIMAL INTERPHALANGEAL JOINT WOUND WITH  TENDON REPAIR AND CULTURES;  Surgeon: WCharlotte Crumb MD;  [place] [facility]  Service: Orthopedics;  Laterality: Right;  . NECK SURGERY    . PERIPHERALLY INSERTED CENTRAL CATHETER INSERTION Right 2007   for treatment with Vancomycin   . POLYPECTOMY  11/12/2015   Procedure: POLYPECTOMY;  Surgeon: SDanie Binder MD;  [facility];  Service: Endoscopy;;  transverse colon polyp  . POSTERIOR CERVICAL LAMINECTOMY N/A 11/22/2013   Procedure: POSTERIOR CERVICAL LAMINECTOMY C7/T1, T/1-2;  Surgeon: [name], MD;  Location: [place] [facility];  Service: Neurosurgery;  Laterality: N/A;  POSTERIOR CERVICAL LAMINECTOMY C7/T1, T/1-2  . TEE WITHOUT CARDIOVERSION N/A 08/09/2012   Procedure: TRANSESOPHAGEAL ECHOCARDIOGRAM (TEE);  Surgeon: [name], MD;  [facility];  Service: Cardiovascular;  Laterality: N/A;    Home Medications:    Allergies as of 11/08/2019      Reactions   Chantix [varenicline] Other (See Comments)   Vivid nightmares   Ciprofloxacin Swelling   Nsaids Hives   Pt takes ibuprofen with no reaction   Shrimp [shellfish Allergy] Itching   Makes hands itch    Tylenol [acetaminophen] Other (See Comments)   Patient has cirrhosis of the liver and this causes elevated levels      Medication List       Accurate as of November 08, 2019  2:14 PM. If you have any questions, ask your nurse or doctor.        Accu-Chek Guide test strip Generic drug: glucose blood USE TO TEST ONCE DAILY.   Accu-Chek Guide w/Device Kit USE AS DIRECTED.R   ARIPiprazole 10 MG tablet Commonly known as: ABILIFY Take 10 mg by mouth daily.   Breo Ellipta 100-25 MCG/INH Aepb Generic drug: fluticasone furoate-vilanterol Inhale 1 puff into the lungs daily.   Buprenorphine HCl-Naloxone HCl 8-2 MG Film Place 1 Film under the tongue 3 (three) times daily.   busPIRone 10 MG tablet Commonly known as: BUSPAR Take 10 mg by mouth 2 (two) times daily as needed (anxiety).   Docusate Sodium 100 MG capsule Take 200 mg by mouth 2 (two) times daily.   furosemide 40 MG tablet Commonly known as: LASIX Take 40 mg by mouth daily.   HAIR/SKIN/NAILS PO Take 1 tablet by mouth daily.   hydrocortisone 2.5 % rectal cream Commonly known as: Anusol-HC Place 1 application rectally 2 (two) times daily.   lamoTRIgine 25 MG tablet Commonly known as: LAMICTAL Take 25 mg by mouth 2 (two) times daily.   Lantus SoloStar 100 UNIT/ML Solostar Pen Generic drug: insulin glargine SMARTSIG:15 Unit(s) SUB-Q Every Night   linaclotide 290 MCG Caps capsule Commonly known as: Linzess Take 1 capsule (290 mcg total) by mouth daily before breakfast.   lisinopril 2.5 MG tablet Commonly known as: ZESTRIL Take 2.5 mg by mouth daily.   Narcan 4 MG/0.1ML Liqd nasal spray kit Generic drug: naloxone Place 0.4 mg into the nose once.   pantoprazole 40  MG tablet Commonly known as: PROTONIX TAKE ONE TABLET BY MOUTH TWICE A DAY   pravastatin 40 MG tablet Commonly known as: PRAVACHOL Take 40 mg by mouth daily.   Spiriva HandiHaler 18 MCG inhalation capsule Generic drug: tiotropium 1 capsule daily.   Sure Comfort Pen Needles 31G X 5 MM Misc Generic drug: Insulin Pen Needle INJECT AS DIRECTED.O   Synjardy XR 12.05-998 MG Tb24 Generic drug: Empagliflozin-metFORMIN HCl ER Take 1 tablet by mouth 2 (two) times daily.   Vraylar capsule Generic drug: cariprazine Take 1.5 mg by mouth daily.       Allergies:  Allergies  Allergen Reactions  . Chantix [Varenicline] Other (See Comments)    Vivid nightmares  . Ciprofloxacin Swelling  . Nsaids Hives    Pt takes ibuprofen with no reaction  . Shrimp [Shellfish Allergy] Itching    Makes hands itch   .  Tylenol [Acetaminophen] Other (See Comments)    Patient has cirrhosis of the liver and this causes elevated levels    Family History: Family History  Problem Relation Age of Onset  . Hypertension Mother   . Hypertension Father   . Cirrhosis Father        Drank some alcohol  . Diabetes Brother   . Diabetes Other   . Cirrhosis Paternal Aunt        No alcohol  . Cirrhosis Paternal Aunt        No alcohol  . Colon cancer Neg Hx     Social History:  reports that she has been smoking cigarettes. She has a 45.00 pack-year smoking history. She has never used smokeless tobacco. She reports current alcohol use. She reports current drug use. Drugs: IV and Marijuana.  ROS: All other review of systems were reviewed and are negative except what is noted above in HPI  Physical Exam: BP 120/76   Pulse (!) 102   Temp 98.2 F (36.8 C)   Ht _0  (1.651 m)   Wt 200 lb (90.7 kg)   LMP 01/04/2017 (Approximate)   BMI 33.28 kg/m   Constitutional:  Alert and oriented, No acute distress. HEENT: [place] AT, moist mucus membranes.  Trachea midline, no masses. Cardiovascular: No clubbing, cyanosis,  or edema. Respiratory: Normal respiratory effort, no increased work of breathing. GI: Abdomen is soft, nontender, nondistended, no abdominal masses GU: No CVA tenderness.  Lymph: No cervical or inguinal lymphadenopathy. Skin: No rashes, bruises or suspicious lesions. Neurologic: Grossly intact, no focal deficits, moving all 4 extremities. Psychiatric: Normal mood and affect.  Laboratory Data: Lab Results  Component Value Date   WBC 7.9 08/09/2019   HGB 13.5 08/09/2019   HCT 40.6 08/09/2019   MCV 91 08/09/2019   PLT 194 08/09/2019    Lab Results  Component Value Date   CREATININE 0.99 10/13/2019    No results found for: PSA  No results found for: TESTOSTERONE  Lab Results  Component Value Date   HGBA1C 7.6 (H) 08/13/2017    Urinalysis    Component Value Date/Time   COLORURINE YELLOW 05/19/2017 1810   APPEARANCEUR Clear 11/08/2019 1333   LABSPEC 1.017 05/19/2017 1810   PHURINE 6.0 05/19/2017 1810   GLUCOSEU 3+ (A) 11/08/2019 1333   HGBUR LARGE (A) 05/19/2017 1810   BILIRUBINUR Negative 11/08/2019 1333   KETONESUR negative 10/30/2019 1936   KETONESUR NEGATIVE 05/19/2017 1810   PROTEINUR Negative 11/08/2019 1333   PROTEINUR NEGATIVE 05/19/2017 1810   UROBILINOGEN 0.2 10/30/2019 1936   UROBILINOGEN 1.0 05/31/2014 0949   NITRITE Negative 11/08/2019 1333   NITRITE NEGATIVE 05/19/2017 1810   LEUKOCYTESUR Negative 11/08/2019 1333    Lab Results  Component Value Date   LABMICR See below: 11/08/2019   WBCUA 0-5 11/08/2019   LABEPIT 0-10 11/08/2019   BACTERIA Few (A) 11/08/2019    Pertinent Imaging: MRI 09/22/2019: Images reviewed and discussed with the patient Results for orders placed during the hospital encounter of 09/29/15  DG Abd 1 View  Narrative CLINICAL DATA:  Ileus, lower abdominal pain 6 days  EXAM: ABDOMEN - 1 VIEW  COMPARISON:  10/26/2015 CT  FINDINGS: Gas within mildly prominent colon. Moderate stool. No obstruction or free air. No  organomegaly or suspicious calcification. Rightward scoliosis in the lumbar spine with degenerative changes.  IMPRESSION: Moderate stool burden. Mild gaseous distention of the colon without evidence of obstruction.   Electronically Signed By: Rolm Baptise M.D. On: 10/01/2015  15:19  No results found for this or any previous visit.  No results found for this or any previous visit.  No results found for this or any previous visit.  Results for orders placed during the hospital encounter of 05/30/14  US Renal  Narrative CLINICAL DATA:  Renal failure  EXAM: RENAL / URINARY TRACT ULTRASOUND COMPLETE  COMPARISON:  None.  FINDINGS: Right Kidney:  Length: 12.0 cm. Echogenicity within normal limits. No mass or hydronephrosis visualized.  Left Kidney:  Length: 12.6 cm. Echogenicity within normal limits. No mass or hydronephrosis visualized.  Bladder:  Decompressed around a Foley catheter and cannot be evaluated.  IMPRESSION: Negative for hydronephrosis.  No significant abnormality.   Electronically Signed By: Andreas Newport M.D. On: 06/01/2014 06:47  No results found for this or any previous visit.  No results found for this or any previous visit.  No results found for this or any previous visit.   Assessment & Plan:    1. Kidney lesion --We discussed the natural hx of renal masses and the various causes. We discussed active surveillance, ablation, partial/radical nephrectomy. After discussing the options the patient elects for surveillance. I will see her back in 6 months with a renal US - Urinalysis, Routine w reflex microscopic   No follow-ups on file.  Nicolette Bang, MD  [facility] [place]

## 2019-11-08 NOTE — Patient Instructions (Signed)
Renal Mass  A renal mass is a growth in the kidney. A renal mass may be found while performing an MRI, CT scan, or ultrasound for other problems of the abdomen. Certain types of cancers, infections, or injuries can cause a renal mass. A renal mass that is cancerous (malignant) may grow or spread quickly. Others are harmless (benign). What are common types of renal masses? Renal masses include:  Tumors. These may be cancerous (malignant) or noncancerous (benign). ? The most common type of kidney cancer is renal cell carcinoma. ? The most common benign tumors of the kidney include renal adenomas, oncocytomas, and angiomyolipoma (AML).  Cysts. These are fluid-filled sacs that form on or in the kidney. ? It is not always known what causes a cyst to develop in or on the kidney. ? Most kidney cysts do not cause symptoms and do not need to be treated. What type of testing might I need? Your health care provider may recommend that you have tests to diagnose the cause of your renal mass. The following tests may be done if a renal mass is found:  Physical exam.  Blood tests.  Urine tests.  Imaging tests, such as ultrasound, CT scan, or MRI.  Biopsy. This is a small sample that is removed from the renal mass and tested in a lab. The exact tests and how often they are done will depend on:  The size and appearance of the renal mass.  Risk factors or medical conditions that increase your risk for problems.  Any symptoms associated with the renal mass, or concerns that you have about it. Tests and physical exams may be done once, or they may be done regularly for a period of time. Tests and exams that are done regularly will help monitor whether the mass is growing and beginning to cause problems. What are common treatments for renal masses? Treatment is not always needed for this condition. Your health care provider may recommend careful monitoring (watchful waiting) and regular tests and exams.  Treatment will depend on the cause of the mass. Follow these instructions at home: What you need to do at home will depend on the cause of the mass. Follow the instructions that your health care provider gives to you. In general:  Take over-the-counter and prescription medicines only as told by your health care provider.  If you are prescribed an antibiotic medicine, take it as told by your health care provider. Do not stop taking the antibiotic even if you start to feel better.  Follow any restrictions that are given to you by your health care provider.  Keep all follow-up visits as told by your health care provider. This is important. ? You may need to see your health care provider once or twice a year to have CT scans and ultrasounds done. These tests will show if your renal mass has changed or grown bigger. Contact a health care provider if you:  Have pain in the side or back (flank pain).  Have a fever.  Feel full soon after eating.  Have pain or swelling in the abdomen.  Lose weight. Get help right away if:  Your pain gets worse.  There is blood in your urine.  You cannot urinate.  You have chest pain.  You have trouble breathing. Summary  A renal mass is a growth in the kidney. It may be cancerous (malignant) and grow or spread quickly, or it may be harmless (benign).  Renal masses may be found while performing  an MRI, CT scan, or ultrasound for other problems of the abdomen.  Your health care provider may recommend that you have tests to diagnose the cause of your renal mass. This may include a physical exam, blood tests, urine tests, imaging, or a biopsy.  Treatment is not always needed for this condition. Careful monitoring (watchful waiting) may be recommended. This information is not intended to replace advice given to you by your health care provider. Make sure you discuss any questions you have with your health care provider. Document Revised: 02/18/2017  Document Reviewed: 02/18/2017 Elsevier Patient Education  2020 [employer].

## 2019-12-14 ENCOUNTER — Other Ambulatory Visit (HOSPITAL_COMMUNITY): Payer: Self-pay | Admitting: Family Medicine

## 2019-12-14 DIAGNOSIS — Z1239 Encounter for other screening for malignant neoplasm of breast: Secondary | ICD-10-CM

## 2020-03-12 ENCOUNTER — Ambulatory Visit: Payer: Medicare Other | Admitting: Gastroenterology

## 2020-03-12 ENCOUNTER — Telehealth: Payer: Self-pay | Admitting: Internal Medicine

## 2020-03-12 NOTE — Telephone Encounter (Signed)
Jamie Bell  Pt. Jamie Bell (DOB 31-Mar-1970) has rescheduled her appt from today she had with you (due to lack of transportation) until 04/24/2020 @ 2:30 pm.

## 2020-03-12 NOTE — Telephone Encounter (Signed)
Prefer to wait until seen and then we will order appropriate labs and u/s.

## 2020-03-12 NOTE — Telephone Encounter (Signed)
Pt called to reschedule her OV for today due to no transportation. She is aware of new appointment. She is asking if we can go ahead and schedule her liver US. Please advise. [phone]

## 2020-03-12 NOTE — Telephone Encounter (Signed)
Jamie Bell, please advise is she needs liver US.

## 2020-03-12 NOTE — Telephone Encounter (Signed)
noted 

## 2020-03-12 NOTE — Telephone Encounter (Signed)
Tried to call pt, no answer, no answering machine.

## 2020-03-13 NOTE — Telephone Encounter (Signed)
Letter mailed

## 2020-04-24 ENCOUNTER — Telehealth: Payer: Self-pay | Admitting: Gastroenterology

## 2020-04-24 ENCOUNTER — Telehealth (INDEPENDENT_AMBULATORY_CARE_PROVIDER_SITE_OTHER): Payer: Medicare Other | Admitting: Gastroenterology

## 2020-04-24 ENCOUNTER — Telehealth: Payer: Medicare Other | Admitting: Gastroenterology

## 2020-04-24 ENCOUNTER — Other Ambulatory Visit: Payer: Self-pay

## 2020-04-24 ENCOUNTER — Encounter: Payer: Self-pay | Admitting: Gastroenterology

## 2020-04-24 DIAGNOSIS — K219 Gastro-esophageal reflux disease without esophagitis: Secondary | ICD-10-CM | POA: Diagnosis not present

## 2020-04-24 DIAGNOSIS — K5900 Constipation, unspecified: Secondary | ICD-10-CM

## 2020-04-24 DIAGNOSIS — K7460 Unspecified cirrhosis of liver: Secondary | ICD-10-CM

## 2020-04-24 NOTE — Telephone Encounter (Signed)
Patient seen virtually.   Please mail lab orders to patient so she can take with her to PCP OV next month.   Please schedule for ruq u/s. She has renal u/s due (ordered by [facility]). See if we can do at same time.   Needs return ov in six months.

## 2020-04-24 NOTE — Telephone Encounter (Signed)
[place] abd RUQ scheduled for 05/01/20 at 9:30am, arrive at 9:15am. NPO after midnight before test.   Called and informed pt of [place] appt. Informed pt we are unable to schedule US renal d/t unable to schedule tests ordered by another provider.

## 2020-04-24 NOTE — Telephone Encounter (Signed)
Noted  

## 2020-04-24 NOTE — Patient Instructions (Addendum)
1. Please take our lab orders with you to PCP next month and have drawn with PCP labs. Otherwise you can take them to [facility] in [place] at any time.  2. We will try and coordinate your liver and kidney ultrasounds to be done same day next month.  3. Continue pantoprazole 62m daily for reflux. Call if you need dosage increased at any time.  4. Continue Linzess for constipation.  5. Return to the office in six months.
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
## 2020-04-24 NOTE — Telephone Encounter (Signed)
Lab orders in the mail

## 2020-05-01 ENCOUNTER — Ambulatory Visit (HOSPITAL_COMMUNITY)
Admission: RE | Admit: 2020-05-01 | Discharge: 2020-05-01 | Disposition: A | Discharge status: Home / Self Care | Payer: Medicare Other | Source: Ambulatory Visit | Attending: Gastroenterology | Admitting: Gastroenterology

## 2020-05-01 ENCOUNTER — Ambulatory Visit (HOSPITAL_COMMUNITY)
Admission: RE | Admit: 2020-05-01 | Discharge: 2020-05-01 | Disposition: A | Discharge status: Home / Self Care | Payer: Medicare Other | Source: Ambulatory Visit | Attending: Urology | Admitting: Urology

## 2020-05-01 DIAGNOSIS — K5900 Constipation, unspecified: Secondary | ICD-10-CM | POA: Insufficient documentation

## 2020-05-01 DIAGNOSIS — N289 Disorder of kidney and ureter, unspecified: Secondary | ICD-10-CM

## 2020-05-01 DIAGNOSIS — K7460 Unspecified cirrhosis of liver: Secondary | ICD-10-CM | POA: Diagnosis present

## 2020-05-01 DIAGNOSIS — K219 Gastro-esophageal reflux disease without esophagitis: Secondary | ICD-10-CM | POA: Insufficient documentation

## 2020-05-01 DIAGNOSIS — N281 Cyst of kidney, acquired: Secondary | ICD-10-CM | POA: Diagnosis not present

## 2020-05-02 ENCOUNTER — Telehealth: Payer: Self-pay

## 2020-05-02 NOTE — Telephone Encounter (Signed)
Hi Jamie Bell from [facility] phoned to advise me to let the Dr know who ordered US Abd Limited (Liver/Gallbladder) lesion has gotten larger and she advised me to go into Epic to pull it. She also advised of the US Renal but I saw where another Dr ordered that one after I had got off the phone with her.

## 2020-05-03 NOTE — Telephone Encounter (Signed)
noted 

## 2020-05-03 NOTE — Telephone Encounter (Signed)
Unfortunately the "lesion" that has gotten bigger is not on the u/s that I ordered but on the u/s that Dr. Alyson Ingles (urologist) ordered. The enlarging lesion is of the kidney.   According to Epic, Dr. Noland Fordyce nurse is aware.   Please see result note sent to you today. Patient's liver u/s is stable. She needs to complete labs that I ordered previously.   Please tell pt to follow up with Dr. Alyson Ingles about renal u/s findings.

## 2020-05-08 ENCOUNTER — Ambulatory Visit: Payer: Medicare Other | Admitting: Urology

## 2020-05-13 NOTE — Progress Notes (Signed)
Dear Pamala Hurry   We have attempted on several occasions to reach you without success. Please call our office at your earliest convenience.      Thank you, Oleh Genin, CMA

## 2020-05-15 NOTE — H&P (Signed)
Surgical History & Physical  Patient Name: Jamie Bell DOB: Jan 01, 1971  Surgery: Cataract extraction with intraocular lens implant phacoemulsification; Right Eye  Surgeon: Baruch Goldmann MD Surgery Date:  05/24/2020 Pre-Op Date:  04/25/2020  HPI: A 9 Yr. old female patient Pt referred by Dr. Jorja Loa for cataract evaluation. The patient complains of difficulty when recognizing people, which began 2 years ago. The right eye is affected. The condition's severity is worsening. Pt has d/c driving during the day and night due to very poor vision right eye. Symptoms are negatively affecting pt's quality of life. Pt denies any increase in floaters/flashes. Patient had cataract surgery in the left eye several years ago - that eye is doing well. HPI was performed by Baruch Goldmann .  Medical History: Cataracts Diabetes LDL  Review of Systems Negative Allergic/Immunologic Negative Cardiovascular Negative Constitutional Negative Ear, Nose, Mouth & Throat Negative Endocrine Negative Eyes Negative Gastrointestinal Negative Genitourinary Negative Hemotologic/Lymphatic Negative Integumentary Negative Musculoskeletal Negative Neurological Negative Psychiatry Negative Respiratory  Social   Current every day smoker of Cigarettes   Medication Pravastatin, Synjardy, Lantus, Protonix, Lisinopril, TRAZODONE, LAMOTRIGINE, SUBOXONE, VICTOZA, VRAYLAR, FUROSEMIDE, LOSARTAN POTASSIUM, PANTOPRAZOLE SODIUM, LINZESS, HYDROXYZINE HYDROCHLORIDE,   Sx/Procedures Phaco c IOL,  Back Surgery, Neck surgery, Gallbladder,   Drug Allergies  Cipro,   History & Physical: Heent:  Cataract, Right eye NECK: supple without bruits LUNGS: lungs clear to auscultation CV: regular rate and rhythm Abdomen: soft and non-tender  Impression & Plan: Assessment: 1.  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  INTRAOCULAR LENS IOL (Z96.1) 3.  BLEPHARITIS; Right Upper Lid, Right Lower  Lid, Left Upper Lid, Left Lower Lid (H01.001, H01.002,H01.004,H01.005) 4.  PCO; Left Eye [phone])  Plan: 1.  Cataract accounts for the patient's decreased vision. This visual impairment is not correctable with a tolerable change in glasses or contact lenses. Cataract surgery with an implantation of a new lens should significantly improve the visual and functional status of the patient. Discussed all risks, benefits, alternatives, and potential complications. Discussed the procedures and recovery. Patient desires to have surgery. A-scan ordered and performed today for intra-ocular lens calculations. The surgery will be performed in order to improve vision for driving, reading, and for eye examinations. Recommend phacoemulsification with intra-ocular lens. Recommend Dextenza for post-operative pain and inflammation. Right Eye. Surgery required to correct imbalance of vision. Dilates poorly - shugacaine by protocol. [facility] [place]. Malyugin Ring. Omidira.  2.  Stable.  3.  regular lid cleaning.  4.  Asymptomatic. Findings, prognosis and treatment options reviewed. No indication for laser at this point, will observe for changes.

## 2020-05-17 ENCOUNTER — Other Ambulatory Visit: Payer: Self-pay

## 2020-05-17 ENCOUNTER — Encounter (HOSPITAL_COMMUNITY)
Admission: RE | Admit: 2020-05-17 | Discharge: 2020-05-17 | Disposition: A | Discharge status: Home / Self Care | Payer: Medicare Other | Source: Ambulatory Visit | Attending: Ophthalmology | Admitting: Ophthalmology

## 2020-05-22 ENCOUNTER — Other Ambulatory Visit: Payer: Self-pay

## 2020-05-22 ENCOUNTER — Other Ambulatory Visit (HOSPITAL_COMMUNITY)
Admission: RE | Admit: 2020-05-22 | Discharge: 2020-05-22 | Disposition: A | Discharge status: Home / Self Care | Payer: Medicare Other | Source: Ambulatory Visit | Attending: Ophthalmology | Admitting: Ophthalmology

## 2020-05-22 DIAGNOSIS — Z01812 Encounter for preprocedural laboratory examination: Secondary | ICD-10-CM | POA: Insufficient documentation

## 2020-05-22 DIAGNOSIS — Z20822 Contact with and (suspected) exposure to covid-19: Secondary | ICD-10-CM | POA: Diagnosis not present

## 2020-05-22 LAB — SARS CORONAVIRUS 2 (TAT 6-24 HRS): SARS Coronavirus 2: NEGATIVE

## 2020-05-23 ENCOUNTER — Telehealth: Payer: Self-pay | Admitting: Internal Medicine

## 2020-05-23 NOTE — Telephone Encounter (Signed)
noted 

## 2020-05-23 NOTE — Telephone Encounter (Signed)
Phoned the back @ [phone] advising her of her result note. Reminded the pt to get her labs done and she stated to me she has an appt on June 2nd to have this done and she has a follow-up with Dr. Alyson Ingles on May 18th regarding kidney lesion.

## 2020-05-23 NOTE — Telephone Encounter (Signed)
Pt received a letter that we were unable to reach her by phone. Please call [phone] or [phone]

## 2020-05-24 ENCOUNTER — Encounter (HOSPITAL_COMMUNITY): Payer: Self-pay | Admitting: Ophthalmology

## 2020-05-24 ENCOUNTER — Encounter (HOSPITAL_COMMUNITY)
Admission: RE | Disposition: A | Discharge status: Home / Self Care | Payer: Self-pay | Source: Home / Self Care | Attending: Ophthalmology

## 2020-05-24 ENCOUNTER — Ambulatory Visit (HOSPITAL_COMMUNITY): Payer: Medicare Other | Admitting: Anesthesiology

## 2020-05-24 ENCOUNTER — Ambulatory Visit (HOSPITAL_COMMUNITY)
Admission: RE | Admit: 2020-05-24 | Discharge: 2020-05-24 | Disposition: A | Discharge status: Home / Self Care | Payer: Medicare Other | Attending: Ophthalmology | Admitting: Ophthalmology

## 2020-05-24 DIAGNOSIS — F17210 Nicotine dependence, cigarettes, uncomplicated: Secondary | ICD-10-CM | POA: Diagnosis not present

## 2020-05-24 DIAGNOSIS — E1136 Type 2 diabetes mellitus with diabetic cataract: Secondary | ICD-10-CM | POA: Diagnosis not present

## 2020-05-24 DIAGNOSIS — H0100A Unspecified blepharitis right eye, upper and lower eyelids: Secondary | ICD-10-CM | POA: Diagnosis not present

## 2020-05-24 DIAGNOSIS — Z79899 Other long term (current) drug therapy: Secondary | ICD-10-CM | POA: Diagnosis not present

## 2020-05-24 DIAGNOSIS — H2521 Age-related cataract, morgagnian type, right eye: Secondary | ICD-10-CM | POA: Insufficient documentation

## 2020-05-24 DIAGNOSIS — H0100B Unspecified blepharitis left eye, upper and lower eyelids: Secondary | ICD-10-CM | POA: Diagnosis not present

## 2020-05-24 DIAGNOSIS — Z794 Long term (current) use of insulin: Secondary | ICD-10-CM | POA: Diagnosis not present

## 2020-05-24 HISTORY — PX: CATARACT EXTRACTION W/PHACO: SHX586

## 2020-05-24 LAB — GLUCOSE, CAPILLARY: Glucose-Capillary: 122 mg/dL — ABNORMAL HIGH (ref 70–99)

## 2020-05-24 SURGERY — PHACOEMULSIFICATION, CATARACT, WITH IOL INSERTION
Anesthesia: Monitor Anesthesia Care | Site: Eye | Laterality: Right

## 2020-05-24 MED ORDER — BSS IO SOLN
INTRAOCULAR | Status: DC | PRN
  Administered 2020-05-24: 15 mL

## 2020-05-24 MED ORDER — TROPICAMIDE 1 % OP SOLN
1.0000 [drp] | OPHTHALMIC | Status: AC
  Administered 2020-05-24 (×3): 1 [drp] via OPHTHALMIC

## 2020-05-24 MED ORDER — TETRACAINE HCL 0.5 % OP SOLN
1.0000 [drp] | OPHTHALMIC | Status: AC | PRN
  Administered 2020-05-24 (×3): 1 [drp] via OPHTHALMIC

## 2020-05-24 MED ORDER — POVIDONE-IODINE 5 % OP SOLN
OPHTHALMIC | Status: DC | PRN
  Administered 2020-05-24: 1 via OPHTHALMIC

## 2020-05-24 MED ORDER — FENTANYL CITRATE (PF) 100 MCG/2ML IJ SOLN
INTRAMUSCULAR | Status: DC | PRN
  Administered 2020-05-24 (×2): 50 ug via INTRAVENOUS

## 2020-05-24 MED ORDER — LIDOCAINE HCL (PF) 1 % IJ SOLN
INTRAOCULAR | Status: DC | PRN
  Administered 2020-05-24: 1 mL via OPHTHALMIC

## 2020-05-24 MED ORDER — FENTANYL CITRATE (PF) 100 MCG/2ML IJ SOLN
INTRAMUSCULAR | Status: AC
  Filled 2020-05-24: qty 2

## 2020-05-24 MED ORDER — STERILE WATER FOR IRRIGATION IR SOLN
Status: DC | PRN
  Administered 2020-05-24: 250 mL

## 2020-05-24 MED ORDER — PHENYLEPHRINE HCL 2.5 % OP SOLN
1.0000 [drp] | OPHTHALMIC | Status: AC | PRN
  Administered 2020-05-24 (×3): 1 [drp] via OPHTHALMIC

## 2020-05-24 MED ORDER — MIDAZOLAM HCL 2 MG/2ML IJ SOLN
INTRAMUSCULAR | Status: DC | PRN
  Administered 2020-05-24 (×2): 1 mg via INTRAVENOUS

## 2020-05-24 MED ORDER — EPINEPHRINE PF 1 MG/ML IJ SOLN
INTRAMUSCULAR | Status: AC
  Filled 2020-05-24: qty 1

## 2020-05-24 MED ORDER — SODIUM HYALURONATE 23MG/ML IO SOSY
PREFILLED_SYRINGE | INTRAOCULAR | Status: DC | PRN
  Administered 2020-05-24: 0.6 mL via INTRAOCULAR

## 2020-05-24 MED ORDER — LIDOCAINE HCL 3.5 % OP GEL
1.0000 "application " | Freq: Once | OPHTHALMIC | Status: AC
  Administered 2020-05-24: 1 via OPHTHALMIC

## 2020-05-24 MED ORDER — PHENYLEPHRINE-KETOROLAC 1-0.3 % IO SOLN
INTRAOCULAR | Status: AC
  Filled 2020-05-24: qty 4

## 2020-05-24 MED ORDER — NEOMYCIN-POLYMYXIN-DEXAMETH 3.5-10000-0.1 OP SUSP
OPHTHALMIC | Status: DC | PRN
  Administered 2020-05-24: 1 [drp] via OPHTHALMIC

## 2020-05-24 MED ORDER — MIDAZOLAM HCL 2 MG/2ML IJ SOLN
INTRAMUSCULAR | Status: AC
  Filled 2020-05-24: qty 2

## 2020-05-24 MED ORDER — TRYPAN BLUE 0.06 % OP SOLN
OPHTHALMIC | Status: DC | PRN
  Administered 2020-05-24: 0.5 mL via INTRAOCULAR

## 2020-05-24 MED ORDER — SODIUM HYALURONATE 10 MG/ML IO SOLUTION
PREFILLED_SYRINGE | INTRAOCULAR | Status: DC | PRN
  Administered 2020-05-24 (×2): 0.85 mL via INTRAOCULAR

## 2020-05-24 MED ORDER — PHENYLEPHRINE-KETOROLAC 1-0.3 % IO SOLN
INTRAOCULAR | Status: DC | PRN
  Administered 2020-05-24: 500 mL via OPHTHALMIC

## 2020-05-24 MED ORDER — TRYPAN BLUE 0.06 % OP SOLN
OPHTHALMIC | Status: AC
  Filled 2020-05-24: qty 0.5

## 2020-05-24 SURGICAL SUPPLY — 11 items
CLOTH BEACON ORANGE TIMEOUT ST (SAFETY) ×2 IMPLANT
EYE SHIELD UNIVERSAL CLEAR (GAUZE/BANDAGES/DRESSINGS) ×1 IMPLANT
GLOVE SURG UNDER POLY LF SZ7 (GLOVE) ×2 IMPLANT
NEEDLE HYPO 18GX1.5 BLUNT FILL (NEEDLE) ×2 IMPLANT
PAD ARMBOARD 7.5X6 YLW CONV (MISCELLANEOUS) ×1 IMPLANT
RAYNER RAYONE EMV US PRELOADED HYDROPHILIC ACRYLIC (Intraocular Lens) ×1 IMPLANT
SYR TB 1ML LL NO SAFETY (SYRINGE) ×1 IMPLANT
TAPE SURG TRANSPORE 1 IN (GAUZE/BANDAGES/DRESSINGS) IMPLANT
TAPE SURGICAL TRANSPORE 1 IN (GAUZE/BANDAGES/DRESSINGS) ×2
VISCOELASTIC ADDITIONAL (OPHTHALMIC RELATED) IMPLANT
WATER STERILE IRR 250ML POUR (IV SOLUTION) ×1 IMPLANT

## 2020-05-24 NOTE — Anesthesia Preprocedure Evaluation (Signed)
Anesthesia Evaluation  Patient identified by MRN, date of birth, ID band Patient awake    Reviewed: Allergy & Precautions, NPO status , Patient's Chart, lab work & pertinent test results  History of Anesthesia Complications Negative for: history of anesthetic complications  Airway Mallampati: II  TM Distance: >3 FB Neck ROM: Full   Comment: ACDF Dental  (+) Poor Dentition, Chipped, Missing, Dental Advisory Given, Edentulous Upper   Pulmonary shortness of breath and with exertion, pneumonia, resolved, Current Smoker and Patient abstained from smoking.,    Pulmonary exam normal breath sounds clear to auscultation       Cardiovascular hypertension, Pt. on medications Normal cardiovascular exam Rhythm:Regular Rate:Normal     Neuro/Psych PSYCHIATRIC DISORDERS Anxiety Depression  Neuromuscular disease    GI/Hepatic GERD  Medicated,(+) Cirrhosis  (fatty liver)    substance abuse  , Hepatitis -  Endo/Other  diabetes, Well Controlled, Type 2, Oral Hypoglycemic Agents  Renal/GU Renal disease     Musculoskeletal  (+) Arthritis  (ACDF), narcotic dependent  Abdominal   Peds  Hematology   Anesthesia Other Findings On suboxone   Reproductive/Obstetrics                            Anesthesia Physical  Anesthesia Plan  ASA: III  Anesthesia Plan: MAC   Post-op Pain Management:    Induction:   PONV Risk Score and Plan:   Airway Management Planned: Nasal Cannula and Natural Airway  Additional Equipment:   Intra-op Plan:   Post-operative Plan:   Informed Consent: I have reviewed the patients History and Physical, chart, labs and discussed the procedure including the risks, benefits and alternatives for the proposed anesthesia with the patient or authorized representative who has indicated his/her understanding and acceptance.     Dental advisory given  Plan Discussed with: CRNA and  Surgeon  Anesthesia Plan Comments:         Anesthesia Quick Evaluation

## 2020-05-24 NOTE — Op Note (Signed)
Date of procedure: 05/24/20  Pre-operative diagnosis: Mature Visually significant age-related cataract, Right Eye (H25.21)  Post-operative diagnosis: Mature Visually significant age-related cataract, Right Eye  Procedure: Complex Removal of cataract via phacoemulsification and insertion of intra-ocular lens [id number]  +23.5D into the capsular bag of the Right Eye  Attending surgeon: Gerda Diss. Lanea Vankirk, MD, MA  Anesthesia: MAC, Topical Akten  Complications: None  Estimated Blood Loss: <36m (minimal)  Specimens: None  Implants: As above  Indications:  Mature Visually significant age-related cataract, Right Eye  Procedure:  The patient was seen and identified in the pre-operative area. The operative eye was identified and dilated.  The operative eye was marked.  Topical anesthesia was administered to the operative eye.     The patient was then to the operative suite and placed in the supine position.  A timeout was performed confirming the patient, procedure to be performed, and all other relevant information.   The patient's face was prepped and draped in the usual fashion for intra-ocular surgery.  A lid speculum was placed into the operative eye and the surgical microscope moved into place and focused.  A lack of red reflex due to a mature cataract was confirmed.  A superotemporal paracentesis was created using a 20 gauge paracentesis blade.  Vision blue was injected into the anterior chamber.  Shugarcaine was injected into the anterior chamber.  Viscoelastic was injected into the anterior chamber.  A temporal clear-corneal main wound incision was created using a 2.430mmicrokeratome.  A continuous curvilinear capsulorrhexis was initiated using an irrigating cystitome and completed using capsulorrhexis forceps.  Hydrodissection and hydrodeliniation were performed.  Viscoelastic was injected into the anterior chamber.  A phacoemulsification handpiece and a chopper as a second instrument were used  to remove the nucleus and epinucleus. The irrigation/aspiration handpiece was used to remove any remaining cortical material.   The capsular bag was reinflated with viscoelastic, checked, and found to be intact. The intraocular lens was inserted into the capsular bag and dialed into place using a kuglen hook.  The irrigation/aspiration handpiece was used to remove any remaining viscoelastic.  The clear corneal wound and paracentesis wounds were then hydrated and checked with Weck-Cels to be watertight.  The lid-speculum and drape was removed, and the patient's face was cleaned with a wet and dry 4x4.  Maxitrol was instilled in the eye before a clear shield was taped over the eye. The patient was taken to the post-operative care unit in good condition, having tolerated the procedure well.  Post-Op Instructions: [facility] and will receive all other orders and instructions.

## 2020-05-24 NOTE — Discharge Instructions (Signed)
Please discharge patient when stable, will follow up today with Dr. Marisa Hua at the [facility] office immediately following discharge.  Leave shield in place until visit.  All paperwork with discharge instructions will be given at the office.  [facility] Address:  [address], [place] [address]   PATIENT INSTRUCTIONS POST-ANESTHESIA  IMMEDIATELY FOLLOWING SURGERY:  Do not drive or operate machinery for the first twenty four hours after surgery.  Do not make any important decisions for twenty four hours after surgery or while taking narcotic pain medications or sedatives.  If you develop intractable nausea and vomiting or a severe headache please notify your doctor immediately.  FOLLOW-UP:  Please make an appointment with your surgeon as instructed. You do not need to follow up with anesthesia unless specifically instructed to do so.  WOUND CARE INSTRUCTIONS (if applicable):  Keep a dry clean dressing on the anesthesia/puncture wound site if there is drainage.  Once the wound has quit draining you may leave it open to air.  Generally you should leave the bandage intact for twenty four hours unless there is drainage.  If the epidural site drains for more than 36-48 hours please call the anesthesia department.  QUESTIONS?:  Please feel free to call your physician or the hospital operator if you have any questions, and they will be happy to assist you.

## 2020-05-24 NOTE — Interval H&P Note (Signed)
History and Physical Interval Note:  05/24/2020 9:57 AM  Jamie Bell  has presented today for surgery, with the diagnosis of Nuclear sclerotic cataract - Right eye.  The various methods of treatment have been discussed with the patient and family. After consideration of risks, benefits and other options for treatment, the patient has consented to  Procedure(s) with comments: CATARACT EXTRACTION PHACO AND INTRAOCULAR LENS PLACEMENT RIGHT EYE (Right) - right as a surgical intervention.  The patient's history has been reviewed, patient examined, no change in status, stable for surgery.  I have reviewed the patient's chart and labs.  Questions were answered to the patient's satisfaction.     Baruch Goldmann

## 2020-05-24 NOTE — Transfer of Care (Signed)
Immediate Anesthesia Transfer of Care Note  Patient: Jamie Bell  Procedure(s) Performed: CATARACT EXTRACTION PHACO AND INTRAOCULAR LENS PLACEMENT RIGHT EYE (Right Eye)  Patient Location: PACU  Anesthesia Type:MAC  Level of Consciousness: awake, alert  and oriented  Airway & Oxygen Therapy: Patient Spontanous Breathing  Post-op Assessment: Report given to RN and Post -op Vital signs reviewed and stable  Post vital signs: Reviewed and stable  Last Vitals:  Vitals Value Taken Time  BP    Temp    Pulse    Resp    SpO2      Last Pain:  Vitals:   05/24/20 0904  TempSrc: Oral  PainSc: 0-No pain      Patients Stated Pain Goal: 5 (62/82/41 7530)  Complications: No complications documented.

## 2020-05-24 NOTE — Anesthesia Postprocedure Evaluation (Signed)
Anesthesia Post Note  Patient: Jamie Bell  Procedure(s) Performed: CATARACT EXTRACTION PHACO AND INTRAOCULAR LENS PLACEMENT RIGHT EYE (Right Eye)  Patient location during evaluation: Phase II Anesthesia Type: MAC Level of consciousness: awake and alert and oriented Pain management: pain level controlled Vital Signs Assessment: post-procedure vital signs reviewed and stable Respiratory status: spontaneous breathing and respiratory function stable Cardiovascular status: stable Postop Assessment: no apparent nausea or vomiting Anesthetic complications: no   No complications documented.   Last Vitals:  Vitals:   05/24/20 0904 05/24/20 1031  BP: (!) 122/57 105/80  Pulse: (!) 105 98  Resp: 20 18  Temp: 36.6 C 36.5 C  SpO2: 98% 94%    Last Pain:  Vitals:   05/24/20 1031  TempSrc: Oral  PainSc: 0-No pain                 Morton Simson C Elsey Holts

## 2020-05-28 ENCOUNTER — Encounter (HOSPITAL_COMMUNITY): Payer: Self-pay | Admitting: Ophthalmology

## 2020-06-05 ENCOUNTER — Ambulatory Visit (HOSPITAL_COMMUNITY): Payer: Medicare Other

## 2020-06-12 ENCOUNTER — Telehealth: Payer: Self-pay | Admitting: Urology

## 2020-06-12 ENCOUNTER — Encounter: Payer: Self-pay | Admitting: Urology

## 2020-06-12 ENCOUNTER — Other Ambulatory Visit: Payer: Self-pay

## 2020-06-12 ENCOUNTER — Ambulatory Visit (INDEPENDENT_AMBULATORY_CARE_PROVIDER_SITE_OTHER): Payer: Medicare Other | Admitting: Urology

## 2020-06-12 VITALS — BP 137/86 | HR 76 | Temp 97.8°F | Ht 65.0 in | Wt 207.0 lb

## 2020-06-12 DIAGNOSIS — N2889 Other specified disorders of kidney and ureter: Secondary | ICD-10-CM | POA: Diagnosis not present

## 2020-06-12 LAB — URINALYSIS, ROUTINE W REFLEX MICROSCOPIC
Bilirubin, UA: NEGATIVE
Glucose, UA: NEGATIVE
Ketones, UA: NEGATIVE
Leukocytes,UA: NEGATIVE
Nitrite, UA: NEGATIVE
RBC, UA: NEGATIVE
Specific Gravity, UA: 1.025 (ref 1.005–1.030)
Urobilinogen, Ur: 0.2 mg/dL (ref 0.2–1.0)
pH, UA: 5.5 (ref 5.0–7.5)

## 2020-06-12 LAB — MICROSCOPIC EXAMINATION
RBC, Urine: NONE SEEN /hpf (ref 0–2)
Renal Epithel, UA: NONE SEEN /hpf

## 2020-06-12 MED ORDER — DIAZEPAM 10 MG PO TABS: 10.0000 mg | ORAL_TABLET | Freq: Once | ORAL | 0 refills | Status: AC

## 2020-06-12 NOTE — Progress Notes (Signed)
06/12/2020 2:12 PM   Jamie Bell Feb 28, 1970 [id number]  Referring provider: Alfonse Flavors, MD [address] [place],  [place] [address]  followup left renal mass  HPI: Jamie Bell is a 50yo here for followup for a left renal mass. Renal US shows the left upper pole mass has grown from 29m to 6.5cm. No flank pain. No LUTS. No Hematuria. NO other complaints today   PMH: Past Medical History:  Diagnosis Date  . Abscess    NECK ANTERIOR  . Arthritis   . Chronic back pain   . [place]osis [place]   . Constipation   . DDD (degenerative disc disease)   . Decreased hearing on the right.   F/u by ENT in the past and no further management.  . Depression   . Diabetes mellitus   . Dyspnea    with exertion  . GERD (gastroesophageal reflux disease)   . High cholesterol   . Hypertension    took med. for HTN, several yrs. ago, no longer needs , [facility] Dr[facility] RLattie Hawlast [facility]. [facility]   . MRSA bacteremia 2014   Tx with Vancomycin  . NASH (nonalcoholic steatohepatitis)   . PCOS (polycystic ovarian syndrome)   . Pneumonia 2016  . Polysubstance abuse ([place]   . Sciatica    HNP- Cerv. 7- T2    Surgical History: Past Surgical History:  Procedure Laterality Date  . ANTERIOR CERVICAL DECOMP/DISCECTOMY FUSION N/A 05/28/2017   Procedure: ANTERIOR CERVICAL DECOMPRESSION CERVICAL THREE-FOUR;  Surgeon: CAshok Pall MD;  [facility]  Service: Neurosurgery;  Laterality: N/A;  anterior  . BACK SURGERY    . BREAST EXCISIONAL BIOPSY Left    negative  . BREAST SURGERY Left    blocked milk ducts   . CARPAL TUNNEL RELEASE    . CATARACT EXTRACTION W/PHACO Left 02/04/2015   Procedure: CATARACT EXTRACTION PHACO AND INTRAOCULAR LENS PLACEMENT (IOC);  Surgeon: KTonny Branch MD;  [facility];  Service: Ophthalmology;  Laterality: Left;  CDE: 4.11  . CATARACT EXTRACTION W/PHACO Right 05/24/2020   Procedure: CATARACT EXTRACTION PHACO AND INTRAOCULAR LENS  PLACEMENT RIGHT EYE;  Surgeon: WBaruch Goldmann MD;  Location: [facility];  Service: Ophthalmology;  Laterality: Right;  right CDE=28.55  . CHOLECYSTECTOMY    . COLONOSCOPY WITH PROPOFOL N/A 11/12/2015   Dr. fields:Small internal hemorrhoids, 2 polyps removed (tubular adenomas) with noted congestion and edema in the left colon most consistent with NSAID use.  Next colonoscopy in 5 to 10 years.  . ESOPHAGEAL BANDING N/A 11/12/2015   Procedure: ESOPHAGEAL BANDING;  Surgeon: SDanie Binder MD;  [facility];  Service: Endoscopy;  Laterality: N/A;  . ESOPHAGOGASTRODUODENOSCOPY  2007   Dr. FOneida Alar mild antral erythema. Future procedures need to be done with Propofol per notes.   . ESOPHAGOGASTRODUODENOSCOPY (EGD) WITH PROPOFOL N/A 11/12/2015   Dr. fOneida Alar Normal esophagus with no varices.  Gastritis and duodenitis, biopsy without H. pylori, most consistent with NSAID/aspirin use.  . ESOPHAGOGASTRODUODENOSCOPY (EGD) WITH PROPOFOL N/A 10/17/2019   Carver: no esophageal varices. biopsies from esophagus c/w reflux esophagitis. she had portal hypertensive gastropathy. gastric bx negative for H.pylori and showed nonspecific reactive gastropathy. next EGD in 3 years.   . INCISION AND DRAINAGE WOUND WITH TENDON REPAIR Right 07/03/2016   Procedure: INCISION AND DRAINAGE RIGHT LONG PROXIMAL INTERPHALANGEAL JOINT WOUND WITH  TENDON REPAIR AND CULTURES;  Surgeon: WCharlotte Crumb MD;  [facility]  Service: Orthopedics;  Laterality: Right;  . NECK SURGERY    . PERIPHERALLY INSERTED CENTRAL CATHETER INSERTION Right 2007   for treatment with Vancomycin   . POLYPECTOMY  11/12/2015   Procedure: POLYPECTOMY;  Surgeon: [name], MD;  [facility];  Service: Endoscopy;;  transverse colon polyp  . POSTERIOR CERVICAL LAMINECTOMY N/A 11/22/2013   Procedure: POSTERIOR CERVICAL LAMINECTOMY C7/T1, T/1-2;  Surgeon: [name], MD;  Location: [place] [facility];  Service: Neurosurgery;  Laterality: N/A;   POSTERIOR CERVICAL LAMINECTOMY C7/T1, T/1-2  . TEE WITHOUT CARDIOVERSION N/A 08/09/2012   Procedure: TRANSESOPHAGEAL ECHOCARDIOGRAM (TEE);  Surgeon: [name], MD;  [facility];  Service: Cardiovascular;  Laterality: N/A;    Home Medications:  Allergies as of 06/12/2020      Reactions   Chantix [varenicline] Other (See Comments)   Vivid nightmares   Ciprofloxacin Swelling   Nsaids Hives   Pt takes ibuprofen with no reaction   Shrimp [shellfish Allergy] Itching   Makes hands itch    Tylenol [acetaminophen] Other (See Comments)   Patient has cirrhosis of the liver and this causes elevated levels      Medication List       Accurate as of Jun 12, 2020  2:12 PM. If you have any questions, ask your nurse or doctor.        Accu-Chek Guide test strip Generic drug: glucose blood USE TO TEST ONCE DAILY.   Accu-Chek Guide w/Device Kit USE AS DIRECTED.R   Breo Ellipta 100-25 MCG/INH Aepb Generic drug: fluticasone furoate-vilanterol Inhale 1 puff into the lungs daily.   Buprenorphine HCl-Naloxone HCl 8-2 MG Film Place 3 Film under the tongue 3 (three) times daily.   busPIRone 10 MG tablet Commonly known as: BUSPAR Take 10 mg by mouth 2 (two) times daily as needed (anxiety).   furosemide 20 MG tablet Commonly known as: LASIX Take 20 mg by mouth daily.   HAIR/SKIN/NAILS PO Take 1 tablet by mouth daily.   lamoTRIgine 25 MG tablet Commonly known as: LAMICTAL Take 25 mg by mouth 2 (two) times daily.   Lantus SoloStar 100 UNIT/ML Solostar Pen Generic drug: insulin glargine Inject 26 Units into the skin at bedtime.   linaclotide 290 MCG Caps capsule Commonly known as: Linzess Take 1 capsule (290 mcg total) by mouth daily before breakfast.   liraglutide 18 MG/3ML Sopn Commonly known as: VICTOZA Inject 1.2 mg into the skin every morning.   losartan 50 MG tablet Commonly known as: COZAAR Take 50 mg by mouth daily.   Melatonin 10 MG Tabs Take 10 mg by  mouth at bedtime.   metFORMIN 1000 MG tablet Commonly known as: GLUCOPHAGE Take 1,000 mg by mouth 2 (two) times daily with a meal.   multivitamin with minerals tablet Take 1 tablet by mouth daily.   Narcan 4 MG/0.1ML Liqd nasal spray kit Generic drug: naloxone Place 0.4 mg into the nose once.   pantoprazole 20 MG tablet Commonly known as: PROTONIX Take 20 mg by mouth daily.   pravastatin 40 MG tablet Commonly known as: PRAVACHOL Take 40 mg by mouth daily.   Spiriva HandiHaler 18 MCG inhalation capsule Generic drug: tiotropium Place 1 capsule into inhaler and inhale daily.   Sure Comfort Pen Needles 31G X 5 MM Misc Generic drug: Insulin Pen Needle INJECT AS DIRECTED.O   traZODone 50 MG tablet Commonly known as: DESYREL Take 50-100 mg by mouth at bedtime as needed for sleep.   Vraylar 4.5 MG Caps Generic  drug: Cariprazine HCl Take 4.5 mg by mouth daily.       Allergies:  Allergies  Allergen Reactions  . Chantix [Varenicline] Other (See Comments)    Vivid nightmares  . Ciprofloxacin Swelling  . Nsaids Hives    Pt takes ibuprofen with no reaction  . Shrimp [Shellfish Allergy] Itching    Makes hands itch   . Tylenol [Acetaminophen] Other (See Comments)    Patient has cirrhosis of the liver and this causes elevated levels    Family History: Family History  Problem Relation Age of Onset  . Hypertension Mother   . Hypertension Father   . Cirrhosis Father        Drank some alcohol  . Diabetes Brother   . Diabetes Other   . Cirrhosis Paternal Aunt        No alcohol  . Cirrhosis Paternal Aunt        No alcohol  . Colon cancer Neg Hx     Social History:  reports that she has been smoking cigarettes. She has a 45.00 pack-year smoking history. She has never used smokeless tobacco. She reports current alcohol use. She reports current drug use. Drugs: IV and Marijuana.  ROS: All other review of systems were reviewed and are negative except what is noted above  in HPI  Physical Exam: BP 137/86   Pulse 76   Temp 97.8 F (36.6 C) (Oral)   Ht 5' 5" (1.651 m)   Wt 207 lb (93.9 kg)   LMP 01/04/2017 (Approximate)   BMI 34.45 kg/m   Constitutional:  Alert and oriented, No acute distress. HEENT: [place] AT, moist mucus membranes.  Trachea midline, no masses. Cardiovascular: No clubbing, cyanosis, or edema. Respiratory: Normal respiratory effort, no increased work of breathing. GI: Abdomen is soft, nontender, nondistended, no abdominal masses GU: No CVA tenderness.  Lymph: No cervical or inguinal lymphadenopathy. Skin: No rashes, bruises or suspicious lesions. Neurologic: Grossly intact, no focal deficits, moving all 4 extremities. Psychiatric: Normal mood and affect.  Laboratory Data: Lab Results  Component Value Date   WBC 7.9 08/09/2019   HGB 13.5 08/09/2019   HCT 40.6 08/09/2019   MCV 91 08/09/2019   PLT 194 08/09/2019    Lab Results  Component Value Date   CREATININE 0.99 10/13/2019    No results found for: PSA  No results found for: TESTOSTERONE  Lab Results  Component Value Date   HGBA1C 7.6 (H) 08/13/2017    Urinalysis    Component Value Date/Time   COLORURINE YELLOW 05/19/2017 1810   APPEARANCEUR Clear 11/08/2019 1333   LABSPEC 1.017 05/19/2017 1810   PHURINE 6.0 05/19/2017 1810   GLUCOSEU 3+ (A) 11/08/2019 1333   HGBUR LARGE (A) 05/19/2017 1810   BILIRUBINUR Negative 11/08/2019 1333   KETONESUR negative 10/30/2019 1936   KETONESUR NEGATIVE 05/19/2017 1810   PROTEINUR Negative 11/08/2019 1333   PROTEINUR NEGATIVE 05/19/2017 1810   UROBILINOGEN 0.2 10/30/2019 1936   UROBILINOGEN 1.0 05/31/2014 0949   NITRITE Negative 11/08/2019 1333   NITRITE NEGATIVE 05/19/2017 1810   LEUKOCYTESUR Negative 11/08/2019 1333    Lab Results  Component Value Date   LABMICR See below: 11/08/2019   WBCUA 0-5 11/08/2019   LABEPIT 0-10 11/08/2019   BACTERIA Few (A) 11/08/2019    Pertinent Imaging: Renal US 05/01/2020: Images  reviewed and discussed with the patient Results for orders placed during the hospital encounter of 09/29/15  DG Abd 1 View  Narrative CLINICAL DATA:  Ileus, lower abdominal pain 6  days  EXAM: ABDOMEN - 1 VIEW  COMPARISON:  10/26/2015 CT  FINDINGS: Gas within mildly prominent colon. Moderate stool. No obstruction or free air. No organomegaly or suspicious calcification. Rightward scoliosis in the lumbar spine with degenerative changes.  IMPRESSION: Moderate stool burden. Mild gaseous distention of the colon without evidence of obstruction.   Electronically Signed By: Rolm Baptise M.D. On: 10/01/2015 15:19  No results found for this or any previous visit.  No results found for this or any previous visit.  No results found for this or any previous visit.  Results for orders placed during the hospital encounter of 05/01/20  Ultrasound renal complete  Narrative CLINICAL DATA:  Kidney lesion seen on MRI.  EXAM: RENAL / URINARY TRACT ULTRASOUND COMPLETE  COMPARISON:  MRI of September 22, 2019.  Ultrasound of March 30, 2018.  FINDINGS: Right Kidney:  Renal measurements: 12.4 x 5.1 x 5.0 cm = volume: 168 mL. Echogenicity within normal limits. No mass or hydronephrosis visualized.  Left Kidney:  Renal measurements: 11.9 x 5.9 x 5.5 cm = volume: 202 mL. 6.5 x 3.7 x 3.1 cm complex cystic lesion is seen involving the upper pole. This corresponds in location to abnormality seen on prior MRI. This lesion appears to have thickened irregular walls and at least 1 thickened irregular septum. Echogenicity within normal limits. No hydronephrosis visualized.  Bladder:  Appears normal for degree of bladder distention.  Other:  None.  IMPRESSION: 6.5 x 3.7 x 3.1 cm complex cystic lesion is seen involving the upper pole of left kidney which corresponds to abnormality noted on MRI. It appears to be significantly enlarged compared to prior exam, and is highly concerning for  neoplasm or malignancy. Consultation with urology is recommended. These results will be called to the ordering clinician or representative by the Radiologist Assistant, and communication documented in the PACS or zVision Dashboard.   Electronically Signed By: Marijo Conception M.D. On: 05/02/2020 10:36  No results found for this or any previous visit.  No results found for this or any previous visit.  No results found for this or any previous visit.   Assessment & Plan:    1. Left renal mass -We will repeat MRI abd.    No follow-ups on file.  Nicolette Bang, MD  [facility] [place]

## 2020-06-12 NOTE — Addendum Note (Signed)
Addended byIris Pert on: 06/12/2020 03:21 PM   Modules accepted: Orders

## 2020-06-12 NOTE — Addendum Note (Signed)
Addended by: Cleon Gustin on: 06/12/2020 02:47 PM   Modules accepted: Orders

## 2020-06-12 NOTE — Telephone Encounter (Signed)
MD aware and valium sent in.

## 2020-06-12 NOTE — Progress Notes (Signed)
Urological Symptom Review  Patient is experiencing the following symptoms: Getting up at night to urinate   Review of Systems  Gastrointestinal (upper)  : Negative for upper GI symptoms  Gastrointestinal (lower) : Negative for lower GI symptoms  Constitutional : Negative for symptoms  Skin: Negative for skin symptoms  Eyes: Negative for eye symptoms  Ear/Nose/Throat : Negative for Ear/Nose/Throat symptoms  Hematologic/Lymphatic: Negative for Hematologic/Lymphatic symptoms  Cardiovascular : Negative for cardiovascular symptoms  Respiratory : Negative for respiratory symptoms  Endocrine: Negative for endocrine symptoms  Musculoskeletal: Negative for musculoskeletal symptoms  Neurological: Negative for neurological symptoms  Psychologic: Negative for psychiatric symptoms

## 2020-06-12 NOTE — Addendum Note (Signed)
Addended by: Cleon Gustin on: 06/12/2020 02:27 PM   Modules accepted: Orders

## 2020-06-12 NOTE — Patient Instructions (Signed)

## 2020-06-12 NOTE — Telephone Encounter (Signed)
Pt needs to have a valium called in for her MRI on 06/13/20. Please call it into [place].

## 2020-06-13 ENCOUNTER — Ambulatory Visit (HOSPITAL_COMMUNITY)
Admission: RE | Admit: 2020-06-13 | Discharge: 2020-06-13 | Disposition: A | Discharge status: Home / Self Care | Payer: Medicare Other | Source: Ambulatory Visit | Attending: Urology | Admitting: Urology

## 2020-06-13 DIAGNOSIS — N2889 Other specified disorders of kidney and ureter: Secondary | ICD-10-CM | POA: Insufficient documentation

## 2020-06-13 MED ORDER — GADOBUTROL 1 MMOL/ML IV SOLN
10.0000 mL | Freq: Once | INTRAVENOUS | Status: AC | PRN
  Administered 2020-06-13: 10 mL via INTRAVENOUS

## 2020-06-25 ENCOUNTER — Other Ambulatory Visit: Payer: Self-pay | Admitting: Urology

## 2020-06-25 DIAGNOSIS — N151 Renal and perinephric abscess: Secondary | ICD-10-CM

## 2020-06-25 MED ORDER — SULFAMETHOXAZOLE-TRIMETHOPRIM 800-160 MG PO TABS: 1.0000 | ORAL_TABLET | Freq: Two times a day (BID) | ORAL | 0 refills | Status: DC

## 2020-06-25 NOTE — Progress Notes (Signed)
Patient called and notified of the MRI results. She will be started on bactrim and she will be scheduled left perinephric abscess drainage

## 2020-06-26 ENCOUNTER — Telehealth: Payer: Medicare Other | Admitting: Urology

## 2020-06-26 ENCOUNTER — Other Ambulatory Visit: Payer: Self-pay | Admitting: Urology

## 2020-06-26 NOTE — Addendum Note (Signed)
Addended by: Dessie Coma on: 06/26/2020 11:22 AM   Modules accepted: Orders

## 2020-06-26 NOTE — Progress Notes (Signed)
Changed to STAT per Radiology tech. It has been scheduled.

## 2020-06-27 ENCOUNTER — Other Ambulatory Visit: Payer: Self-pay | Admitting: Student

## 2020-06-27 ENCOUNTER — Encounter (HOSPITAL_COMMUNITY): Payer: Self-pay

## 2020-06-27 NOTE — Progress Notes (Signed)
  Jamie Bell Female, 50 y.o., 07-Aug-1970  MRN:  [id number] Phone:  [phone] [name])       PCP:  Alfonse Flavors, MD Primary Cvg:  [place] [employer]  Next Appt With Radiology ([facility]-CT 1) 07/02/2020 at 9:00 AM           RE: Percutaneous drainage Received: Today Arne Cleveland, MD  Lenore Cordia  Ok   CT aspirate/drain if possible L perinephric abscess   DDH        Previous Messages   ----- Message -----  From: Lenore Cordia  Sent: 06/27/2020 10:15 AM EDT  To: Ir Procedure Requests  Subject: Percutaneous drainage               Procedure Requested: CT Percutaneous drainage    Reason for Procedure: left perinephric abscess    Provider Requesting: Dr Nicolette Bang  Provider Telephone: [phone]   Other Info: Culture, fungus without smear

## 2020-06-28 ENCOUNTER — Ambulatory Visit (HOSPITAL_COMMUNITY): Admission: RE | Admit: 2020-06-28 | Payer: Medicare Other | Source: Ambulatory Visit

## 2020-07-01 ENCOUNTER — Other Ambulatory Visit: Payer: Self-pay | Admitting: Radiology

## 2020-07-01 NOTE — H&P (Signed)
Chief Complaint: Patient was seen in consultation today for image guided aspiration and possible drain placement for left perirenal abscess at the request of [place]  Referring Physician(s): Damon L  Supervising Physician: Mir, Sharen Heck  Patient Status: [facility] - Out-pt  History of Present Illness: Jamie Bell is a 50 y.o. female PMH of hypertension, pneumonia, high cholesterol, DM, MRSA bacteremia, Karlene Lineman cirrhosis, PCOS, polysubstance abuse, anterior neck abscess, and possible peri-nephritic abscess seen on recent MRI abdomen on 06/13/2020.  Was originally referred to urology due to a 11 mm lesion with enhancing rim suspicious for renal neoplasm in the lateral left upper kidney first found on MRI abdomen 09/22/2019. Patient was evaluated by Dr. Alyson Ingles on 11/08/19 and has been undergoing surveillance for the renall lesion. Follow up US renal on 05/01/20 showed:   6.5 x 3.7 x 3.1 cm complex cystic lesion is seen involving the upper pole of left kidney which corresponds to abnormality noted on MRI. It appears to be significantly enlarged compared to prior exam, and is highly concerning for neoplasm or malignancy.  Patient underwent subsequent MR abd w wo on 06/13/20 which showed:   1. Complex 6 cm extrarenal process adjacent to the left kidney. This is most likely a perinephric abscess. On the prior MRI examination I believe the lesion was probably a small abscess that has ruptured out into the perinephric space. Aspiration with culture and sensitivities might be helpful for further evaluation. No worrisome intrarenal lesions. 2. No other significant abdominal findings. Stable common bile duct dilatation likely due to prior cholecystectomy.  The MR result was reviewed by Dr. Alyson Ingles who started patient on bactrim and referred her to IR for aspiration and possible drain placement for the left perinephritic abscess.   IR was requested for image guided aspiration  and possible drain placement for the left perinephritic abscess.   Patient laying in bed, not in acute distress.  Denise headache, fever, chills, shortness of breath, cough, chest pain, abdominal pain, nausea ,vomiting, and bleeding.   Past Medical History:  Diagnosis Date  . Abscess    NECK ANTERIOR  . Arthritis   . Chronic back pain   . Cirrhosis ([place])   . Constipation   . DDD (degenerative disc disease)   . Decreased hearing on the right.   F/u by ENT in the past and no further management.  . Depression   . Diabetes mellitus   . Dyspnea    with exertion  . GERD (gastroesophageal reflux disease)   . High cholesterol   . Hypertension    took med. for HTN, several yrs. ago, no longer needs , seen by Dr. Lattie Haw last yr. during a hosp. at [facility], for MRSA bacteremia   . MRSA bacteremia 2014   Tx with Vancomycin  . NASH (nonalcoholic steatohepatitis)   . PCOS (polycystic ovarian syndrome)   . Pneumonia 2016  . Polysubstance abuse ([place])   . Sciatica    HNP- Cerv. 7- T2    Past Surgical History:  Procedure Laterality Date  . ANTERIOR CERVICAL DECOMP/DISCECTOMY FUSION N/A 05/28/2017   Procedure: ANTERIOR CERVICAL DECOMPRESSION CERVICAL THREE-FOUR;  Surgeon: Ashok Pall, MD;  Location: [place];  Service: Neurosurgery;  Laterality: N/A;  anterior  . BACK SURGERY    . BREAST EXCISIONAL BIOPSY Left    negative  . BREAST SURGERY Left    blocked milk ducts   . CARPAL TUNNEL RELEASE    . CATARACT EXTRACTION W/PHACO Left 02/04/2015   Procedure: CATARACT EXTRACTION PHACO  AND INTRAOCULAR LENS PLACEMENT (IOC);  Surgeon: [name], MD;  Location: [facility];  Service: Ophthalmology;  Laterality: Left;  CDE: 4.11  . CATARACT EXTRACTION W/PHACO Right 05/24/2020   Procedure: CATARACT EXTRACTION PHACO AND INTRAOCULAR LENS PLACEMENT RIGHT EYE;  Surgeon: Baruch Goldmann, MD;  Location: [facility];  Service: Ophthalmology;  Laterality: Right;  right CDE=28.55  . CHOLECYSTECTOMY    . COLONOSCOPY WITH  PROPOFOL N/A 11/12/2015   Dr. fields:Small internal hemorrhoids, 2 polyps removed (tubular adenomas) with noted congestion and edema in the left colon most consistent with NSAID use.  Next colonoscopy in 5 to 10 years.  . ESOPHAGEAL BANDING N/A 11/12/2015   Procedure: ESOPHAGEAL BANDING;  Surgeon: [name], MD;  [facility];  Service: Endoscopy;  Laterality: N/A;  . ESOPHAGOGASTRODUODENOSCOPY  2007   Dr. Oneida Alar: mild antral erythema. Future procedures need to be done with Propofol per notes.   . ESOPHAGOGASTRODUODENOSCOPY (EGD) WITH PROPOFOL N/A 11/12/2015   Dr. Oneida Alar: Normal esophagus with no varices.  Gastritis and duodenitis, biopsy without H. pylori, most consistent with NSAID/aspirin use.  . ESOPHAGOGASTRODUODENOSCOPY (EGD) WITH PROPOFOL N/A 10/17/2019   Carver: no esophageal varices. biopsies from esophagus c/w reflux esophagitis. she had portal hypertensive gastropathy. gastric bx negative for H.pylori and showed nonspecific reactive gastropathy. next EGD in 3 years.   . INCISION AND DRAINAGE WOUND WITH TENDON REPAIR Right 07/03/2016   Procedure: INCISION AND DRAINAGE RIGHT LONG PROXIMAL INTERPHALANGEAL JOINT WOUND WITH  TENDON REPAIR AND CULTURES;  Surgeon: [name], MD;  Location: [place];  Service: Orthopedics;  Laterality: Right;  . NECK SURGERY    . PERIPHERALLY INSERTED CENTRAL CATHETER INSERTION Right 2007   for treatment with Vancomycin   . POLYPECTOMY  11/12/2015   Procedure: POLYPECTOMY;  Surgeon: [name], MD;  [facility];  Service: Endoscopy;;  transverse colon polyp  . POSTERIOR CERVICAL LAMINECTOMY N/A 11/22/2013   Procedure: POSTERIOR CERVICAL LAMINECTOMY C7/T1, T/1-2;  Surgeon: [name], MD;  Location: [place] [facility];  Service: Neurosurgery;  Laterality: N/A;  POSTERIOR CERVICAL LAMINECTOMY C7/T1, T/1-2  . TEE WITHOUT CARDIOVERSION N/A 08/09/2012   Procedure: TRANSESOPHAGEAL ECHOCARDIOGRAM (TEE);  Surgeon: [name], MD;   [facility];  Service: Cardiovascular;  Laterality: N/A;    Allergies: Chantix [varenicline], Ciprofloxacin, Nsaids, Shrimp [shellfish allergy], and Tylenol [acetaminophen]  Medications: Prior to Admission medications   Medication Sig Start Date End Date Taking? Authorizing Provider  ACCU-CHEK GUIDE test strip USE TO TEST ONCE DAILY. 11/04/19   [provider]  Biotin w/ Vitamins C & E (HAIR/SKIN/NAILS PO) Take 1 tablet by mouth daily.    [provider]  Blood Glucose Monitoring Suppl (ACCU-CHEK GUIDE) w/Device KIT USE AS DIRECTED.R 08/22/19   [provider]  Buprenorphine HCl-Naloxone HCl 8-2 MG FILM Place 3 Film under the tongue 3 (three) times daily. 05/18/17   [provider]  busPIRone (BUSPAR) 10 MG tablet Take 10 mg by mouth 2 (two) times daily as needed (anxiety).  07/11/19   [provider]  fluticasone furoate-vilanterol (BREO ELLIPTA) 100-25 MCG/INH AEPB Inhale 1 puff into the lungs daily. 09/12/19   Chesley Mires, MD  furosemide (LASIX) 20 MG tablet Take 20 mg by mouth daily. 01/09/19   [provider]  lamoTRIgine (LAMICTAL) 25 MG tablet Take 25 mg by mouth 2 (two) times daily.  08/08/19   [provider]  LANTUS SOLOSTAR 100 UNIT/ML Solostar Pen Inject 26 Units into the skin at bedtime.  10/31/19   [provider]  linaclotide Rolan Lipa) 290 MCG CAPS capsule Take 1 capsule (290 mcg total) by mouth daily before breakfast. 10/24/19   Mahala Menghini, PA-C  liraglutide (VICTOZA) 18 MG/3ML SOPN Inject 1.2 mg into the skin every morning.    [provider]  losartan (COZAAR) 50 MG tablet Take 50 mg by mouth daily. 03/29/20   [provider]  Melatonin 10 MG TABS Take 10 mg by mouth at bedtime.    [provider]  metFORMIN (GLUCOPHAGE) 1000 MG tablet Take 1,000 mg by mouth 2 (two) times daily with a meal.    [provider]  Multiple Vitamins-Minerals (MULTIVITAMIN WITH  MINERALS) tablet Take 1 tablet by mouth daily.    [provider]  NARCAN 4 MG/0.1ML LIQD nasal spray kit Place 0.4 mg into the nose once.  04/03/19   [provider]  pantoprazole (PROTONIX) 20 MG tablet Take 20 mg by mouth daily.    [provider]  pravastatin (PRAVACHOL) 40 MG tablet Take 40 mg by mouth daily. 12/27/18   [provider]  SPIRIVA HANDIHALER 18 MCG inhalation capsule Place 1 capsule into inhaler and inhale daily. 10/26/19   [provider]  sulfamethoxazole-trimethoprim (BACTRIM DS) 800-160 MG tablet Take 1 tablet by mouth 2 (two) times daily. 06/25/20   McKenzie, Candee Furbish, MD  SURE COMFORT PEN NEEDLES 31G X 5 MM MISC INJECT AS DIRECTED.O 10/31/19   [provider]  traZODone (DESYREL) 50 MG tablet Take 50-100 mg by mouth at bedtime as needed for sleep. 04/20/20   [provider]  VRAYLAR 4.5 MG CAPS Take 4.5 mg by mouth daily. 05/07/20   [provider]     Family History  Problem Relation Age of Onset  . Hypertension Mother   . Hypertension Father   . Cirrhosis Father        Drank some alcohol  . Diabetes Brother   . Diabetes Other   . Cirrhosis Paternal Aunt        No alcohol  . Cirrhosis Paternal Aunt        No alcohol  . Colon cancer Neg Hx     Social History   Socioeconomic History  . Marital status: Divorced    Spouse name: Not on file  . Number of children: Not on file  . Years of education: Not on file  . Highest education level: Not on file  Occupational History  . Occupation: disable  Tobacco Use  . Smoking status: Former Smoker    Packs/day: 1.50    Years: 30.00    Pack years: 45.00    Types: Cigarettes  . Smokeless tobacco: Never Used  . Tobacco comment: smokes 1.5 packs per day  Vaping Use  . Vaping Use: Every day  Substance and Sexual Activity  . Alcohol use: Yes    Comment: social  . Drug use: Yes    Types: IV, Marijuana    Comment: prior history of cocaine use;  09/06/19 marijuana now  . Sexual activity: Yes    Birth control/protection: None  Other Topics Concern  . Not on file  Social History Narrative  . Not on file   Social Determinants of Health   Financial Resource Strain: Medium Risk  . Difficulty of Paying Living Expenses: Somewhat hard  Food Insecurity: No Food Insecurity  . Worried About Charity fundraiser in the Last Year: Never true  . Ran Out of Food in the Last Year:  Never true  Transportation Needs: Unmet Transportation Needs  . Lack of Transportation (Medical): Yes  . Lack of Transportation (Non-Medical): Yes  Physical Activity: Insufficiently Active  . Days of Exercise per Week: 2 days  . Minutes of Exercise per Session: 20 min  Stress: Stress Concern Present  . Feeling of Stress : Rather much  Social Connections: Moderately Isolated  . Frequency of Communication with Friends and Family: Twice a week  . Frequency of Social Gatherings with Friends and Family: Once a week  . Attends Religious Services: 1 to 4 times per year  . Active Member of Clubs or Organizations: No  . Attends Archivist Meetings: Never  . Marital Status: Divorced     Review of Systems: A 12 point ROS discussed and pertinent positives are indicated in the HPI above.  All other systems are negative.   Vital Signs: BP 129/87   Pulse 88   Temp 97.8 F (36.6 C) (Oral)   Resp 15   LMP 01/04/2017 (Approximate)   SpO2 95%   Physical Exam Vitals reviewed.  Constitutional:      General: She is not in acute distress.    Appearance: She is not ill-appearing.  HENT:     Head: Normocephalic and atraumatic.     Mouth/Throat:     Mouth: Mucous membranes are moist.  Cardiovascular:     Rate and Rhythm: Normal rate and regular rhythm.     Pulses: Normal pulses.  Pulmonary:     Effort: Pulmonary effort is normal.     Breath sounds: Wheezing present.     Comments: Wheezing in all lobes  Abdominal:     General: Abdomen is flat. Bowel  sounds are normal.     Palpations: Abdomen is soft.  Musculoskeletal:     Cervical back: Neck supple.  Skin:    General: Skin is warm and dry.     Coloration: Skin is not jaundiced or pale.  Neurological:     Mental Status: She is alert and oriented to person, place, and time.  Psychiatric:        Mood and Affect: Mood normal.        Behavior: Behavior normal.        Thought Content: Thought content normal.        Judgment: Judgment normal.     MD Evaluation Airway: WNL Heart: WNL Abdomen: WNL Chest/ Lungs: WNL ASA  Classification: 2 Mallampati/Airway Score: Two  Imaging: MR Abdomen W Wo Contrast  Result Date: 06/13/2020 CLINICAL DATA:  Evaluate left renal lesion seen on prior MRI from August 2021 and appearing larger are on recent ultrasound from 05/01/2020 EXAM: MRI ABDOMEN WITHOUT AND WITH CONTRAST TECHNIQUE: Multiplanar multisequence MR imaging of the abdomen was performed both before and after the administration of intravenous contrast. CONTRAST:  44m GADAVIST GADOBUTROL 1 MMOL/ML IV SOLN COMPARISON:  Prior MRI 09/22/2019 and renal ultrasound 05/01/2020 FINDINGS: Lower chest: The lung bases are clear of an acute process. No pleural effusions or pulmonary lesion. No pericardial effusion. Hepatobiliary: No hepatic lesions or intrahepatic biliary dilatation. There is moderate common bile duct dilatation likely due to prior cholecystectomy. This appears relatively stable. No common bile duct stones. Pancreas:  No mass, inflammation or ductal dilatation. Spleen:  Normal size.  No focal lesions. Adrenals/Urinary Tract: The adrenal glands are unremarkable and stable. The right kidney is unremarkable. Tiny upper pole cyst noted posteriorly. The inflammatory or infectious process seen on the prior MRI has resolved. There is a  complex extra renal process involving the left kidney. There is a irregular 3.5 cm fluid collection with rim enhancement. There is also thick enhancing soft tissue  around this area along with marked inflammatory changes in the perinephric fat. I think this is most likely a perinephric abscess. On the prior MRI examination I believe the lesion was probably a small abscess that has ruptured out into the perinephric space. Aspiration with culture and sensitivities might be helpful for further evaluation. Stomach/Bowel: The stomach, duodenum, visualized small bowel and visualized colon are grossly normal. Vascular/Lymphatic: The aorta and branch vessels are patent. The major venous structures are patent. There are borderline retroperitoneal lymph nodes which are likely reactive. Other:  No ascites or abdominal wall hernia. Musculoskeletal: No significant findings. Stable scoliosis and degenerative spondylosis. IMPRESSION: 1. Complex 6 cm extrarenal process adjacent to the left kidney. This is most likely a perinephric abscess. On the prior MRI examination I believe the lesion was probably a small abscess that has ruptured out into the perinephric space. Aspiration with culture and sensitivities might be helpful for further evaluation. No worrisome intrarenal lesions. 2. No other significant abdominal findings. Stable common bile duct dilatation likely due to prior cholecystectomy. Electronically Signed   By: Marijo Sanes M.D.   On: 06/13/2020 16:32    Labs:  CBC: Recent Labs    08/09/19 1131 07/02/20 0826  WBC 7.9 5.2  HGB 13.5 13.8  HCT 40.6 42.8  PLT 194 141*    COAGS: Recent Labs    08/09/19 1131 10/13/19 1000 07/02/20 0826  INR 1.1 1.1 1.1    BMP: Recent Labs    08/09/19 1131 09/12/19 1209 10/13/19 1000  NA 142 141 137  K 3.8 4.5 4.4  CL 103 99 106  CO2 19* 24 22  GLUCOSE 231* 216* 145*  BUN 23 31* 27*  CALCIUM 9.2 10.4* 10.4*  CREATININE 1.18* 1.17* 0.99  GFRNONAA 55* 55* >60  GFRAA 63 64 >60    LIVER FUNCTION TESTS: Recent Labs    08/09/19 1131 09/12/19 1209 10/13/19 1000  BILITOT 0.3 0.3 0.3  AST 77* 37 43*  ALT 69* 39*  47*  ALKPHOS 133* 142* 88  PROT 7.8 8.5 8.1  ALBUMIN 3.9 4.7 4.0    TUMOR MARKERS: No results for input(s): AFPTM, CEA, CA199, CHROMGRNA in the last 8760 hours.  Assessment and Plan: 50 y.o. female with known 11 mm lesion with enhancing rim suspicious for renal neoplasm in the lateral left upper kidney first found on MRI abdomen 09/22/2019. Patient was evaluated by Dr. Alyson Ingles on 11/08/19 and has been undergoing surveillance for the renall lesion. Follow up US showed enlargement of the left renal lesion, and subsequent MR revealed the lesion is most likely a perinephritic abscess.   IR was requested for image guided aspiration and possible drain placement for the left perinephritic abscess by urology.  Case was reviewed by Dr. Vernard Gambles, who approved a CT guided aspiration and possible drain placement of the left perinephritic abscess.  Pt presents to IR today for the procedure.  NPO since midnight  On Excedrin Migraine which contains 250 mg ASA. Discussed with Dr. Dwaine Gale, ok to proceed.  INR 1.1  CBC mild thrombocytopenia, plt 141  VSS   Risks and benefits discussed with the patient including bleeding, infection, damage to adjacent structures, bowel perforation/fistula connection, and sepsis.  All of the patient's questions were answered, patient is agreeable to proceed. Consent signed and in chart.   Thank you for this  interesting consult.  I greatly enjoyed meeting Jamie Bell and look forward to participating in their care.  A copy of this report was sent to the requesting provider on this date.  Electronically Signed: Tera Mater, PA-C 07/02/2020, 9:06 AM   I spent a total of  30 Minutes   in face to face in clinical consultation, greater than 50% of which was counseling/coordinating care for aspiration and possible drain placement for left perinephritic abscess.

## 2020-07-02 ENCOUNTER — Ambulatory Visit (HOSPITAL_COMMUNITY)
Admission: RE | Admit: 2020-07-02 | Discharge: 2020-07-02 | Disposition: A | Discharge status: Home / Self Care | Payer: Medicare Other | Source: Ambulatory Visit | Attending: Urology | Admitting: Urology

## 2020-07-02 ENCOUNTER — Encounter (HOSPITAL_COMMUNITY): Payer: Self-pay

## 2020-07-02 ENCOUNTER — Other Ambulatory Visit: Payer: Self-pay

## 2020-07-02 ENCOUNTER — Telehealth: Payer: Self-pay

## 2020-07-02 DIAGNOSIS — N151 Renal and perinephric abscess: Secondary | ICD-10-CM | POA: Insufficient documentation

## 2020-07-02 LAB — CBC
HCT: 42.8 % (ref 36.0–46.0)
Hemoglobin: 13.8 g/dL (ref 12.0–15.0)
MCH: 28.9 pg (ref 26.0–34.0)
MCHC: 32.2 g/dL (ref 30.0–36.0)
MCV: 89.7 fL (ref 80.0–100.0)
Platelets: 141 10*3/uL — ABNORMAL LOW (ref 150–400)
RBC: 4.77 MIL/uL (ref 3.87–5.11)
RDW: 16 % — ABNORMAL HIGH (ref 11.5–15.5)
WBC: 5.2 10*3/uL (ref 4.0–10.5)
nRBC: 0 % (ref 0.0–0.2)

## 2020-07-02 LAB — PROTIME-INR
INR: 1.1 (ref 0.8–1.2)
Prothrombin Time: 14.2 seconds (ref 11.4–15.2)

## 2020-07-02 LAB — GLUCOSE, CAPILLARY: Glucose-Capillary: 89 mg/dL (ref 70–99)

## 2020-07-02 MED ORDER — LIDOCAINE HCL (PF) 1 % IJ SOLN
INTRAMUSCULAR | Status: AC | PRN
  Administered 2020-07-02: 10 mL

## 2020-07-02 MED ORDER — SODIUM CHLORIDE 0.9 % IV SOLN: INTRAVENOUS | Status: DC

## 2020-07-02 MED ORDER — FENTANYL CITRATE (PF) 100 MCG/2ML IJ SOLN
INTRAMUSCULAR | Status: AC
  Filled 2020-07-02: qty 4

## 2020-07-02 MED ORDER — FENTANYL CITRATE (PF) 100 MCG/2ML IJ SOLN
INTRAMUSCULAR | Status: AC | PRN
  Administered 2020-07-02 (×2): 50 ug via INTRAVENOUS

## 2020-07-02 MED ORDER — MIDAZOLAM HCL 2 MG/2ML IJ SOLN
INTRAMUSCULAR | Status: AC
  Filled 2020-07-02: qty 4

## 2020-07-02 MED ORDER — DIPHENHYDRAMINE HCL 50 MG/ML IJ SOLN
INTRAMUSCULAR | Status: AC | PRN
  Administered 2020-07-02: 25 mg via INTRAVENOUS

## 2020-07-02 MED ORDER — DIPHENHYDRAMINE HCL 50 MG/ML IJ SOLN
INTRAMUSCULAR | Status: AC
  Filled 2020-07-02: qty 1

## 2020-07-02 MED ORDER — CEFAZOLIN SODIUM-DEXTROSE 2-4 GM/100ML-% IV SOLN: 2.0000 g | Freq: Once | INTRAVENOUS | Status: DC

## 2020-07-02 MED ORDER — MIDAZOLAM HCL 2 MG/2ML IJ SOLN
INTRAMUSCULAR | Status: AC | PRN
  Administered 2020-07-02 (×2): 1 mg via INTRAVENOUS

## 2020-07-02 NOTE — Discharge Instructions (Signed)
Interventional radiology phone numbers [phone] After hours [phone]  Needle Aspiration, Care After These instructions tell you how to care for yourself after your procedure. Your doctor may also give you more specific instructions. Call your doctor if you have any problems or questions. What can I expect after the procedure? After the procedure, it is common to have:  Soreness.  Bruising.  Mild pain. Follow these instructions at home: 1. Return to your normal activities as told by your doctor. Ask your doctor what activities are safe for you. 2. Take over-the-counter and prescription medicines only as told by your doctor. 3. Wash your hands with soap and water before you change your bandage (dressing). If you cannot use soap and water, use hand sanitizer. 4. Follow instructions from your doctor about: ? Change the band aid over the site daily until it has healed. 5. Check your puncture site every day for signs of infection. Watch for: ? Redness, swelling, or pain. ? Fluid or blood. ? Pus or a bad smell. ? Warmth. 6. Do not take baths, swim, or use a hot tub until your doctor approves. You may remove your dressing tomorrow and shower. 7. Keep all follow-up visits as told by your doctor. This is important.   Contact a doctor if you have:  A fever.  Redness, swelling, or pain at the puncture site, and it lasts longer than a few days.  Fluid, blood, or pus coming from the puncture site.  Warmth coming from the puncture site. Get help right away if:  You have a lot of bleeding from the puncture site. Summary  After the procedure, it is common to have soreness, bruising, or mild pain at the puncture site.  Check your puncture site every day for signs of infection, such as redness, swelling, or pain.  Get help right away if you have severe bleeding from your puncture site. This information is not intended to replace advice given to you by your health care provider.  Make sure you discuss any questions you have with your health care provider. Document Revised: 07/13/2019 Document Reviewed: 07/13/2019 Elsevier Patient Education  2021 [employer].     Moderate Conscious Sedation, Adult, Care After This sheet gives you information about how to care for yourself after your procedure. Your health care provider may also give you more specific instructions. If you have problems or questions, contact your health care provider. What can I expect after the procedure? After the procedure, it is common to have:  Sleepiness for several hours.  Impaired judgment for several hours.  Difficulty with balance.  Vomiting if you eat too soon. Follow these instructions at home: For the time period you were told by your health care provider:  Rest.  Do not participate in activities where you could fall or become injured.  Do not drive or use machinery.  Do not drink alcohol.  Do not take sleeping pills or medicines that cause drowsiness.  Do not make important decisions or sign legal documents.  Do not take care of children on your own.      Eating and drinking 1. Follow the diet recommended by your health care provider. 2. Drink enough fluid to keep your urine pale yellow. 3. If you vomit: ? Drink water, juice, or soup when you can drink without vomiting. ? Make sure you have little or no nausea before eating solid foods.   General instructions  Take over-the-counter and prescription medicines only as told by your health care provider.  Have a responsible adult stay with you for the time you are told. It is important to have someone help care for you until you are awake and alert.  Do not smoke.  Keep all follow-up visits as told by your health care provider. This is important. Contact a health care provider if:  You are still sleepy or having trouble with balance after 24 hours.  You feel light-headed.  You keep feeling nauseous or you  keep vomiting.  You develop a rash.  You have a fever.  You have redness or swelling around the IV site. Get help right away if:  You have trouble breathing.  You have new-onset confusion at home. Summary  After the procedure, it is common to feel sleepy, have impaired judgment, or feel nauseous if you eat too soon.  Rest after you get home. Know the things you should not do after the procedure.  Follow the diet recommended by your health care provider and drink enough fluid to keep your urine pale yellow.  Get help right away if you have trouble breathing or new-onset confusion at home. This information is not intended to replace advice given to you by your health care provider. Make sure you discuss any questions you have with your health care provider. Document Revised: 05/12/2019 Document Reviewed: 12/08/2018 Elsevier Patient Education  2021 [employer].

## 2020-07-02 NOTE — Telephone Encounter (Signed)
Patient called office today to inquire about f/u appt. Message sent to MD

## 2020-07-02 NOTE — Procedures (Signed)
Interventional Radiology Procedure Note  Procedure: CT guided aspiration of left peri-nephric fluid collection  Indication: Left peri-nephric fluid collection  Findings: Please refer to procedural dictation for full description.  Complications: None  EBL: < 10 mL  Miachel Roux, MD [phone]

## 2020-07-03 LAB — CYTOLOGY - NON PAP

## 2020-07-07 LAB — AEROBIC/ANAEROBIC CULTURE W GRAM STAIN (SURGICAL/DEEP WOUND)

## 2020-07-19 ENCOUNTER — Other Ambulatory Visit: Payer: Self-pay

## 2020-07-19 ENCOUNTER — Ambulatory Visit (INDEPENDENT_AMBULATORY_CARE_PROVIDER_SITE_OTHER): Payer: Medicare Other | Admitting: Urology

## 2020-07-19 VITALS — BP 102/68 | HR 110 | Temp 98.0°F

## 2020-07-19 DIAGNOSIS — N281 Cyst of kidney, acquired: Secondary | ICD-10-CM | POA: Diagnosis not present

## 2020-07-19 DIAGNOSIS — N151 Renal and perinephric abscess: Secondary | ICD-10-CM | POA: Diagnosis not present

## 2020-07-19 LAB — URINALYSIS, ROUTINE W REFLEX MICROSCOPIC
Bilirubin, UA: NEGATIVE
Glucose, UA: NEGATIVE
Ketones, UA: NEGATIVE
Leukocytes,UA: NEGATIVE
Nitrite, UA: NEGATIVE
RBC, UA: NEGATIVE
Specific Gravity, UA: 1.025 (ref 1.005–1.030)
Urobilinogen, Ur: 0.2 mg/dL (ref 0.2–1.0)
pH, UA: 5.5 (ref 5.0–7.5)

## 2020-07-19 LAB — MICROSCOPIC EXAMINATION
RBC, Urine: NONE SEEN /hpf (ref 0–2)
Renal Epithel, UA: NONE SEEN /hpf

## 2020-07-19 MED ORDER — DIAZEPAM 10 MG PO TABS: 10.0000 mg | ORAL_TABLET | Freq: Once | ORAL | 0 refills | Status: AC

## 2020-07-19 MED ORDER — SULFAMETHOXAZOLE-TRIMETHOPRIM 800-160 MG PO TABS: 1.0000 | ORAL_TABLET | Freq: Two times a day (BID) | ORAL | 0 refills | Status: DC

## 2020-07-19 NOTE — Progress Notes (Signed)
Urological Symptom Review  Patient is experiencing the following symptoms: Frequent urination Get up at night to urinate Stream starts and stops   Review of Systems  Gastrointestinal (upper)  : Negative for upper GI symptoms  Gastrointestinal (lower) : Negative for lower GI symptoms  Constitutional : Negative for symptoms  Skin: Negative for skin symptoms  Eyes: Negative for eye symptoms  Ear/Nose/Throat : Negative for Ear/Nose/Throat symptoms  Hematologic/Lymphatic: Negative for Hematologic/Lymphatic symptoms  Cardiovascular : Negative for cardiovascular symptoms  Respiratory : Negative for respiratory symptoms  Endocrine: Negative for endocrine symptoms  Musculoskeletal: Back pain  Neurological: Negative for neurological symptoms  Psychologic: Negative for psychiatric symptoms

## 2020-07-19 NOTE — Progress Notes (Signed)
07/19/2020 3:01 PM   Jamie Bell [birth year] [id number]  Referring provider: Alfonse Flavors, MD [address] [place],  [place] [address]  Followuop perinephric abscess   HPI: Jamie Bell is a 50yo here for followup after perinephric abscess drainage. She underwent aspiration of 6/7 without issues. Culture showed gram positive cocci. She denies any flank pain. She remains on bactrim DS BID. No fevers/chills/sweats. No other complaints today   PMH: Past Medical History:  Diagnosis Date   Abscess    NECK ANTERIOR   Arthritis    Chronic back pain    Cirrhosis ([place])    Constipation    DDD (degenerative disc disease)    Decreased hearing on the right.   F/u by ENT in the past and no further management.   Depression    Diabetes mellitus    Dyspnea    with exertion   GERD (gastroesophageal reflux disease)    High cholesterol    Hypertension    took med. for HTN, several yrs. ago, no longer needs , seen by Jamie Bell last yr. during a hosp. at [facility], for MRSA bacteremia    MRSA bacteremia 2014   Tx with Vancomycin   NASH (nonalcoholic steatohepatitis)    PCOS (polycystic ovarian syndrome)    Pneumonia 2016   Polysubstance abuse ([place])    Sciatica    HNP- Cerv. 7- T2    Surgical History: Past Surgical History:  Procedure Laterality Date   ANTERIOR CERVICAL DECOMP/DISCECTOMY FUSION N/A 05/28/2017   Procedure: ANTERIOR CERVICAL DECOMPRESSION CERVICAL THREE-FOUR;  Surgeon: Jamie Pall, MD;  Location: [place];  Service: Neurosurgery;  Laterality: N/A;  anterior   BACK SURGERY     BREAST EXCISIONAL BIOPSY Left    negative   BREAST SURGERY Left    blocked milk ducts    CARPAL TUNNEL RELEASE     CATARACT EXTRACTION W/PHACO Left 02/04/2015   Procedure: CATARACT EXTRACTION PHACO AND INTRAOCULAR LENS PLACEMENT (Jamie Bell);  Surgeon: Jamie Branch, MD;  Location: [facility];  Service: Ophthalmology;  Laterality: Left;  CDE: 4.11   CATARACT EXTRACTION W/PHACO Right 05/24/2020    Procedure: CATARACT EXTRACTION PHACO AND INTRAOCULAR LENS PLACEMENT RIGHT EYE;  Surgeon: Jamie Goldmann, MD;  Location: [facility];  Service: Ophthalmology;  Laterality: Right;  right CDE=28.55   CHOLECYSTECTOMY     COLONOSCOPY WITH PROPOFOL N/A 11/12/2015   Dr. fields:Small internal hemorrhoids, 2 polyps removed (tubular adenomas) with noted congestion and edema in the left colon most consistent with NSAID use.  Next colonoscopy in 5 to 10 years.   ESOPHAGEAL BANDING N/A 11/12/2015   Procedure: ESOPHAGEAL BANDING;  Surgeon: Jamie Binder, MD;  [facility];  Service: Endoscopy;  Laterality: N/A;   ESOPHAGOGASTRODUODENOSCOPY  2007   Dr. Oneida Bell: mild antral erythema. Future procedures need to be done with Propofol per notes.    ESOPHAGOGASTRODUODENOSCOPY (EGD) WITH PROPOFOL N/A 11/12/2015   Dr. Oneida Bell: Normal esophagus with no varices.  Gastritis and duodenitis, biopsy without H. pylori, most consistent with NSAID/aspirin use.   ESOPHAGOGASTRODUODENOSCOPY (EGD) WITH PROPOFOL N/A 10/17/2019   Jamie Bell: no esophageal varices. biopsies from esophagus c/w reflux esophagitis. she had portal hypertensive gastropathy. gastric bx negative for H.pylori and showed nonspecific reactive gastropathy. next EGD in 3 years.    INCISION AND DRAINAGE WOUND WITH TENDON REPAIR Right 07/03/2016   Procedure: INCISION AND DRAINAGE RIGHT LONG PROXIMAL INTERPHALANGEAL JOINT WOUND WITH  TENDON REPAIR AND CULTURES;  Surgeon: Jamie Crumb, MD;  Location: [place];  Service: Orthopedics;  Laterality: Right;   NECK SURGERY     PERIPHERALLY INSERTED CENTRAL CATHETER INSERTION Right 2007   for treatment with Vancomycin    POLYPECTOMY  11/12/2015   Procedure: POLYPECTOMY;  Surgeon: Jamie Binder, MD;  [facility];  Service: Endoscopy;;  transverse colon polyp   POSTERIOR CERVICAL LAMINECTOMY N/A 11/22/2013   Procedure: POSTERIOR CERVICAL LAMINECTOMY C7/T1, T/1-2;  Surgeon: Jamie Hoops, MD;  Location: [facility];  Service: Neurosurgery;  Laterality: N/A;  POSTERIOR CERVICAL LAMINECTOMY C7/T1, T/1-2   TEE WITHOUT CARDIOVERSION N/A 08/09/2012   Procedure: TRANSESOPHAGEAL ECHOCARDIOGRAM (TEE);  Surgeon: Jamie Savannah, MD;  [facility];  Service: Cardiovascular;  Laterality: N/A;    Home Medications:  Allergies as of 07/19/2020       Reactions   Chantix [varenicline] Other (See Comments)   Vivid nightmares   Ciprofloxacin Swelling   Nsaids Hives   Pt takes ibuprofen with no reaction   Shrimp [shellfish Allergy] Itching   Makes hands itch    Tylenol [acetaminophen] Other (See Comments)   Patient has cirrhosis of the liver and this causes elevated levels        Medication List        Accurate as of July 19, 2020  3:01 PM. If you have any questions, ask your nurse or doctor.          STOP taking these medications    multivitamin with minerals tablet Stopped by: Jamie Bang, MD       TAKE these medications    Accu-Chek Guide test strip Generic drug: glucose blood USE TO TEST ONCE DAILY.   Accu-Chek Guide w/Device Kit USE AS DIRECTED.R   aspirin-acetaminophen-caffeine 250-250-65 MG tablet Commonly known as: EXCEDRIN MIGRAINE Take 2 tablets by mouth every 6 (six) hours as needed for headache.   Breo Ellipta 100-25 MCG/INH Aepb Generic drug: fluticasone furoate-vilanterol Inhale 1 puff into the lungs daily.   Buprenorphine HCl-Naloxone HCl 8-2 MG Film Place 3 Film under the tongue 3 (three) times daily.   busPIRone 10 MG tablet Commonly known as: BUSPAR Take 10 mg by mouth 2 (two) times daily as needed (anxiety).   furosemide 20 MG tablet Commonly known as: LASIX Take 20 mg by mouth daily.   HAIR/SKIN/NAILS PO Take 1 tablet by mouth daily.   lamoTRIgine 25 MG tablet Commonly known as: LAMICTAL Take 25 mg by mouth 2 (two) times daily.   Lantus SoloStar 100 UNIT/ML Solostar Pen Generic drug: insulin glargine Inject 26 Units into the skin  at bedtime.   linaclotide 290 MCG Caps capsule Commonly known as: Linzess Take 1 capsule (290 mcg total) by mouth daily before breakfast.   liraglutide 18 MG/3ML Sopn Commonly known as: VICTOZA Inject 1.2 mg into the skin every morning.   losartan 50 MG tablet Commonly known as: COZAAR Take 50 mg by mouth daily.   Melatonin 10 MG Tabs Take 10 mg by mouth at bedtime.   metFORMIN 1000 MG tablet Commonly known as: GLUCOPHAGE Take 1,000 mg by mouth 2 (two) times daily with a meal.   Narcan 4 MG/0.1ML Liqd nasal spray kit Generic drug: naloxone Place 0.4 mg into the nose once.   pantoprazole 20 MG tablet Commonly known as: PROTONIX Take 20 mg by mouth daily.   pravastatin 40 MG tablet Commonly known as: PRAVACHOL Take 40 mg by mouth daily.   Spiriva HandiHaler 18 MCG inhalation capsule Generic drug: tiotropium Place 1 capsule into inhaler and  inhale daily.   sulfamethoxazole-trimethoprim 800-160 MG tablet Commonly known as: BACTRIM DS Take 1 tablet by mouth 2 (two) times daily.   Sure Comfort Pen Needles 31G X 5 MM Misc Generic drug: Insulin Pen Needle INJECT AS DIRECTED.O   traZODone 50 MG tablet Commonly known as: DESYREL Take 50-100 mg by mouth at bedtime as needed for sleep.   Vraylar 4.5 MG Caps Generic drug: Cariprazine HCl Take 4.5 mg by mouth daily.        Allergies:  Allergies  Allergen Reactions   Chantix [Varenicline] Other (See Comments)    Vivid nightmares   Ciprofloxacin Swelling   Nsaids Hives    Pt takes ibuprofen with no reaction   Shrimp [Shellfish Allergy] Itching    Makes hands itch    Tylenol [Acetaminophen] Other (See Comments)    Patient has cirrhosis of the liver and this causes elevated levels    Family History: Family History  Problem Relation Age of Onset   Hypertension Mother    Hypertension Father    Cirrhosis Father        Drank some alcohol   Diabetes Brother    Diabetes Other    Cirrhosis Paternal Aunt         No alcohol   Cirrhosis Paternal Aunt        No alcohol   Colon cancer Neg Hx     Social History:  reports that she has quit smoking. Her smoking use included cigarettes. She has a 45.00 pack-year smoking history. She has never used smokeless tobacco. She reports current alcohol use. She reports current drug use. Drugs: IV and Marijuana.  ROS: All other review of systems were reviewed and are negative except what is noted above in HPI  Physical Exam: BP 102/68   Pulse (!) 110   Temp 98 F (36.7 C)   LMP 01/04/2017 (Approximate)   Constitutional:  Alert and oriented, No acute distress. HEENT: [place] AT, moist mucus membranes.  Trachea midline, no masses. Cardiovascular: No clubbing, cyanosis, or edema. Respiratory: Normal respiratory effort, no increased work of breathing. GI: Abdomen is soft, nontender, nondistended, no abdominal masses GU: No CVA tenderness.  Lymph: No cervical or inguinal lymphadenopathy. Skin: No rashes, bruises or suspicious lesions. Neurologic: Grossly intact, no focal deficits, moving all 4 extremities. Psychiatric: Normal mood and affect.  Laboratory Data: Lab Results  Component Value Date   WBC 5.2 07/02/2020   HGB 13.8 07/02/2020   HCT 42.8 07/02/2020   MCV 89.7 07/02/2020   PLT 141 (L) 07/02/2020    Lab Results  Component Value Date   CREATININE 0.99 10/13/2019    No results found for: PSA  No results found for: TESTOSTERONE  Lab Results  Component Value Date   HGBA1C 7.6 (H) 08/13/2017    Urinalysis    Component Value Date/Time   COLORURINE YELLOW 05/19/2017 1810   APPEARANCEUR Clear 06/12/2020 1553   LABSPEC 1.017 05/19/2017 1810   PHURINE 6.0 05/19/2017 1810   GLUCOSEU Negative 06/12/2020 1553   HGBUR LARGE (A) 05/19/2017 1810   BILIRUBINUR Negative 06/12/2020 1553   KETONESUR negative 10/30/2019 1936   KETONESUR NEGATIVE 05/19/2017 1810   PROTEINUR 1+ (A) 06/12/2020 1553   PROTEINUR NEGATIVE 05/19/2017 1810   UROBILINOGEN 0.2  10/30/2019 1936   UROBILINOGEN 1.0 05/31/2014 0949   NITRITE Negative 06/12/2020 1553   NITRITE NEGATIVE 05/19/2017 1810   LEUKOCYTESUR Negative 06/12/2020 1553    Lab Results  Component Value Date   LABMICR See below: 06/12/2020  [place] 0-5 06/12/2020   LABEPIT 0-10 06/12/2020   MUCUS Present 06/12/2020   BACTERIA Few 06/12/2020    Pertinent Imaging: CT 07/02/2020: Images reviewed and discussed with the patient  Results for orders placed during the hospital encounter of 09/29/15  DG Abd 1 View  Narrative CLINICAL DATA:  Ileus, lower abdominal pain 6 days  EXAM: ABDOMEN - 1 VIEW  COMPARISON:  10/26/2015 CT  FINDINGS: Gas within mildly prominent colon. Moderate stool. No obstruction or free air. No organomegaly or suspicious calcification. Rightward scoliosis in the lumbar spine with degenerative changes.  IMPRESSION: Moderate stool burden. Mild gaseous distention of the colon without evidence of obstruction.   Electronically Signed By: Rolm Baptise M.D. On: 10/01/2015 15:19  No results found for this or any previous visit.  No results found for this or any previous visit.  No results found for this or any previous visit.  Results for orders placed during the hospital encounter of 05/01/20  Ultrasound renal complete  Narrative CLINICAL DATA:  Kidney lesion seen on MRI.  EXAM: RENAL / URINARY TRACT ULTRASOUND COMPLETE  COMPARISON:  MRI of September 22, 2019.  Ultrasound of March 30, 2018.  FINDINGS: Right Kidney:  Renal measurements: 12.4 x 5.1 x 5.0 cm = volume: 168 mL. Echogenicity within normal limits. No mass or hydronephrosis visualized.  Left Kidney:  Renal measurements: 11.9 x 5.9 x 5.5 cm = volume: 202 mL. 6.5 x 3.7 x 3.1 cm complex cystic lesion is seen involving the upper pole. This corresponds in location to abnormality seen on prior MRI. This lesion appears to have thickened irregular walls and at least 1 thickened irregular septum.  Echogenicity within normal limits. No hydronephrosis visualized.  Bladder:  Appears normal for degree of bladder distention.  Other:  None.  IMPRESSION: 6.5 x 3.7 x 3.1 cm complex cystic lesion is seen involving the upper pole of left kidney which corresponds to abnormality noted on MRI. It appears to be significantly enlarged compared to prior exam, and is highly concerning for neoplasm or malignancy. Consultation with urology is recommended. These results will be called to the ordering clinician or representative by the Radiologist Assistant, and communication documented in the PACS or zVision Dashboard.   Electronically Signed By: Marijo Conception M.D. On: 05/02/2020 10:36  No results found for this or any previous visit.  No results found for this or any previous visit.  No results found for this or any previous visit.   Assessment & Plan:    1. Perinephric abscess -Continue bactrim for 2 weeks. RTC 1 month with MRI abd w/wo - Urinalysis, Routine w reflex microscopic   No follow-ups on file.  Jamie Bang, MD  [facility] [place]

## 2020-07-23 ENCOUNTER — Encounter: Payer: Self-pay | Admitting: Urology

## 2020-07-25 ENCOUNTER — Other Ambulatory Visit (HOSPITAL_COMMUNITY): Payer: Medicare Other

## 2020-07-31 LAB — FUNGUS CULTURE WITH STAIN

## 2020-07-31 LAB — FUNGUS CULTURE RESULT

## 2020-07-31 LAB — FUNGAL ORGANISM REFLEX

## 2020-08-04 ENCOUNTER — Telehealth: Payer: Self-pay | Admitting: Gastroenterology

## 2020-08-04 NOTE — Telephone Encounter (Signed)
Received labs from 07/02/20:  AFP 3.6 INR 1 WBC 6900, H/H 13.6/41.9, platelets 159000 Creatinine 1.15, Na 140, alb 4.1, Tbili 0.2, AP 114, AST 29, ALT 29 MELD 8  Please let pt know I have reviewed labs done by PCP last month. Labs stable. Keep ov as scheduled.

## 2020-08-06 NOTE — Telephone Encounter (Signed)
Result letter and reminder of office visit was mailed to the pt.

## 2020-08-20 ENCOUNTER — Ambulatory Visit (HOSPITAL_COMMUNITY)
Admission: RE | Admit: 2020-08-20 | Discharge: 2020-08-20 | Disposition: A | Discharge status: Home / Self Care | Payer: Medicare Other | Source: Ambulatory Visit | Attending: Urology | Admitting: Urology

## 2020-08-20 ENCOUNTER — Other Ambulatory Visit: Payer: Self-pay

## 2020-08-20 DIAGNOSIS — N281 Cyst of kidney, acquired: Secondary | ICD-10-CM | POA: Diagnosis not present

## 2020-08-20 MED ORDER — GADOBUTROL 1 MMOL/ML IV SOLN
10.0000 mL | Freq: Once | INTRAVENOUS | Status: AC | PRN
  Administered 2020-08-20: 10 mL via INTRAVENOUS

## 2020-08-27 ENCOUNTER — Telehealth: Payer: Medicare Other | Admitting: Urology

## 2020-08-28 ENCOUNTER — Ambulatory Visit: Payer: Medicare Other | Admitting: Urology

## 2020-09-03 ENCOUNTER — Encounter: Payer: Self-pay | Admitting: Urology

## 2020-09-03 ENCOUNTER — Telehealth (INDEPENDENT_AMBULATORY_CARE_PROVIDER_SITE_OTHER): Payer: Medicare Other | Admitting: Urology

## 2020-09-03 DIAGNOSIS — N151 Renal and perinephric abscess: Secondary | ICD-10-CM

## 2020-09-03 NOTE — Progress Notes (Signed)
09/03/2020 1:36 PM   Jamie Bell 09/08/1970 [id number]  Referring provider: Alfonse Flavors, MD [address] [place],  [place] [address]  Patient location: home Physician location: office I connected with  Jamie Bell on 09/03/20 by a video enabled telemedicine application and verified that I am speaking with the correct person using two identifiers.   I discussed the limitations of evaluation and management by telemedicine. The patient expressed understanding and agreed to proceed.    Followup perinephric abscess   HPI: Ms Ruffino is a 50yo here for followup for left perinephric abscess. MRI from 7/26 shows persistent 6cm abscess with rim enhancement.  She continues to have intermittent left flank pain. No LUTS. No fevers. No hematuria.    PMH: Past Medical History:  Diagnosis Date   Abscess    NECK ANTERIOR   Arthritis    Chronic back pain    Cirrhosis ([place])    Constipation    DDD (degenerative disc disease)    Decreased hearing on the right.   F/u by ENT in the past and no further management.   Depression    Diabetes mellitus    Dyspnea    with exertion   GERD (gastroesophageal reflux disease)    High cholesterol    Hypertension    took med. for HTN, several yrs. ago, no longer needs , seen by Dr. Lattie Haw last yr. during a hosp. at [facility], for MRSA bacteremia    MRSA bacteremia 2014   Tx with Vancomycin   NASH (nonalcoholic steatohepatitis)    PCOS (polycystic ovarian syndrome)    Pneumonia 2016   Polysubstance abuse ([place])    Sciatica    HNP- Cerv. 7- T2    Surgical History: Past Surgical History:  Procedure Laterality Date   ANTERIOR CERVICAL DECOMP/DISCECTOMY FUSION N/A 05/28/2017   Procedure: ANTERIOR CERVICAL DECOMPRESSION CERVICAL THREE-FOUR;  Surgeon: Ashok Pall, MD;  Location: [place];  Service: Neurosurgery;  Laterality: N/A;  anterior   BACK SURGERY     BREAST EXCISIONAL BIOPSY Left    negative   BREAST SURGERY Left    blocked  milk ducts    CARPAL TUNNEL RELEASE     CATARACT EXTRACTION W/PHACO Left 02/04/2015   Procedure: CATARACT EXTRACTION PHACO AND INTRAOCULAR LENS PLACEMENT ([place]);  Surgeon: [name], MD;  Location: [facility];  Service: Ophthalmology;  Laterality: Left;  CDE: 4.11   CATARACT EXTRACTION W/PHACO Right 05/24/2020   Procedure: CATARACT EXTRACTION PHACO AND INTRAOCULAR LENS PLACEMENT RIGHT EYE;  Surgeon: Baruch Goldmann, MD;  Location: [facility];  Service: Ophthalmology;  Laterality: Right;  right CDE=28.55   CHOLECYSTECTOMY     COLONOSCOPY WITH PROPOFOL N/A 11/12/2015   Dr. fields:Small internal hemorrhoids, 2 polyps removed (tubular adenomas) with noted congestion and edema in the left colon most consistent with NSAID use.  Next colonoscopy in 5 to 10 years.   ESOPHAGEAL BANDING N/A 11/12/2015   Procedure: ESOPHAGEAL BANDING;  Surgeon: [name], MD;  [facility];  Service: Endoscopy;  Laterality: N/A;   ESOPHAGOGASTRODUODENOSCOPY  2007   Dr. Oneida Alar: mild antral erythema. Future procedures need to be done with Propofol per notes.    ESOPHAGOGASTRODUODENOSCOPY (EGD) WITH PROPOFOL N/A 11/12/2015   Dr. Oneida Alar: Normal esophagus with no varices.  Gastritis and duodenitis, biopsy without H. pylori, most consistent with NSAID/aspirin use.   ESOPHAGOGASTRODUODENOSCOPY (EGD) WITH PROPOFOL N/A 10/17/2019   [place]: no esophageal varices. biopsies from esophagus c/w reflux esophagitis. she had portal hypertensive gastropathy. gastric bx  negative for H.pylori and showed nonspecific reactive gastropathy. next EGD in 3 years.    INCISION AND DRAINAGE WOUND WITH TENDON REPAIR Right 07/03/2016   Procedure: INCISION AND DRAINAGE RIGHT LONG PROXIMAL INTERPHALANGEAL JOINT WOUND WITH  TENDON REPAIR AND CULTURES;  Surgeon: [name], MD;  Location: [place];  Service: Orthopedics;  Laterality: Right;   NECK SURGERY     PERIPHERALLY INSERTED CENTRAL CATHETER INSERTION Right 2007   for treatment with Vancomycin     POLYPECTOMY  11/12/2015   Procedure: POLYPECTOMY;  Surgeon: [name], MD;  [facility];  Service: Endoscopy;;  transverse colon polyp   POSTERIOR CERVICAL LAMINECTOMY N/A 11/22/2013   Procedure: POSTERIOR CERVICAL LAMINECTOMY C7/T1, T/1-2;  Surgeon: [name], MD;  Location: [place] [facility];  Service: Neurosurgery;  Laterality: N/A;  POSTERIOR CERVICAL LAMINECTOMY C7/T1, T/1-2   TEE WITHOUT CARDIOVERSION N/A 08/09/2012   Procedure: TRANSESOPHAGEAL ECHOCARDIOGRAM (TEE);  Surgeon: [name], MD;  [facility];  Service: Cardiovascular;  Laterality: N/A;    Home Medications:  Allergies as of 09/03/2020       Reactions   Chantix [varenicline] Other (See Comments)   Vivid nightmares   Ciprofloxacin Swelling   Nsaids Hives   Pt takes ibuprofen with no reaction   Shrimp [shellfish Allergy] Itching   Makes hands itch    Tylenol [acetaminophen] Other (See Comments)   Patient has cirrhosis of the liver and this causes elevated levels        Medication List        Accurate as of September 03, 2020  1:36 PM. If you have any questions, ask your nurse or doctor.          Accu-Chek Guide test strip Generic drug: glucose blood USE TO TEST ONCE DAILY.   Accu-Chek Guide w/Device Kit USE AS DIRECTED.R   aspirin-acetaminophen-caffeine 250-250-65 MG tablet Commonly known as: EXCEDRIN MIGRAINE Take 2 tablets by mouth every 6 (six) hours as needed for headache.   Breo Ellipta 100-25 MCG/INH Aepb Generic drug: fluticasone furoate-vilanterol Inhale 1 puff into the lungs daily.   Buprenorphine HCl-Naloxone HCl 8-2 MG Film Place 3 Film under the tongue 3 (three) times daily.   busPIRone 10 MG tablet Commonly known as: BUSPAR Take 10 mg by mouth 2 (two) times daily as needed (anxiety).   furosemide 20 MG tablet Commonly known as: LASIX Take 20 mg by mouth daily.   HAIR/SKIN/NAILS PO Take 1 tablet by mouth daily.   lamoTRIgine 25 MG tablet Commonly  known as: LAMICTAL Take 25 mg by mouth 2 (two) times daily.   Lantus SoloStar 100 UNIT/ML Solostar Pen Generic drug: insulin glargine Inject 26 Units into the skin at bedtime.   linaclotide 290 MCG Caps capsule Commonly known as: Linzess Take 1 capsule (290 mcg total) by mouth daily before breakfast.   liraglutide 18 MG/3ML Sopn Commonly known as: VICTOZA Inject 1.2 mg into the skin every morning.   losartan 50 MG tablet Commonly known as: COZAAR Take 50 mg by mouth daily.   Melatonin 10 MG Tabs Take 10 mg by mouth at bedtime.   metFORMIN 1000 MG tablet Commonly known as: GLUCOPHAGE Take 1,000 mg by mouth 2 (two) times daily with a meal.   Narcan 4 MG/0.1ML Liqd nasal spray kit Generic drug: naloxone Place 0.4 mg into the nose once.   pantoprazole 20 MG tablet Commonly known as: PROTONIX Take 20 mg by mouth daily.   pravastatin 40 MG tablet Commonly known  as: PRAVACHOL Take 40 mg by mouth daily.   Spiriva HandiHaler 18 MCG inhalation capsule Generic drug: tiotropium Place 1 capsule into inhaler and inhale daily.   sulfamethoxazole-trimethoprim 800-160 MG tablet Commonly known as: BACTRIM DS Take 1 tablet by mouth 2 (two) times daily.   Sure Comfort Pen Needles 31G X 5 MM Misc Generic drug: Insulin Pen Needle INJECT AS DIRECTED.O   traZODone 50 MG tablet Commonly known as: DESYREL Take 50-100 mg by mouth at bedtime as needed for sleep.   Vraylar 4.5 MG Caps Generic drug: Cariprazine HCl Take 4.5 mg by mouth daily.        Allergies:  Allergies  Allergen Reactions   Chantix [Varenicline] Other (See Comments)    Vivid nightmares   Ciprofloxacin Swelling   Nsaids Hives    Pt takes ibuprofen with no reaction   Shrimp [Shellfish Allergy] Itching    Makes hands itch    Tylenol [Acetaminophen] Other (See Comments)    Patient has cirrhosis of the liver and this causes elevated levels    Family History: Family History  Problem Relation Age of Onset    Hypertension Mother    Hypertension Father    Cirrhosis Father        Drank some alcohol   Diabetes Brother    Diabetes Other    Cirrhosis Paternal Aunt        No alcohol   Cirrhosis Paternal Aunt        No alcohol   Colon cancer Neg Hx     Social History:  reports that she has quit smoking. Her smoking use included cigarettes. She has a 45.00 pack-year smoking history. She has never used smokeless tobacco. She reports current alcohol use. She reports current drug use. Drugs: IV and Marijuana.  ROS: All other review of systems were reviewed and are negative except what is noted above in HPI   Laboratory Data: Lab Results  Component Value Date   WBC 5.2 07/02/2020   HGB 13.8 07/02/2020   HCT 42.8 07/02/2020   MCV 89.7 07/02/2020   PLT 141 (L) 07/02/2020    Lab Results  Component Value Date   CREATININE 0.99 10/13/2019    No results found for: PSA  No results found for: TESTOSTERONE  Lab Results  Component Value Date   HGBA1C 7.6 (H) 08/13/2017    Urinalysis    Component Value Date/Time   COLORURINE YELLOW 05/19/2017 1810   APPEARANCEUR Clear 07/19/2020 1458   LABSPEC 1.017 05/19/2017 1810   PHURINE 6.0 05/19/2017 1810   GLUCOSEU Negative 07/19/2020 1458   HGBUR LARGE (A) 05/19/2017 1810   BILIRUBINUR Negative 07/19/2020 1458   KETONESUR negative 10/30/2019 1936   KETONESUR NEGATIVE 05/19/2017 1810   PROTEINUR 1+ (A) 07/19/2020 1458   PROTEINUR NEGATIVE 05/19/2017 1810   UROBILINOGEN 0.2 10/30/2019 1936   UROBILINOGEN 1.0 05/31/2014 0949   NITRITE Negative 07/19/2020 1458   NITRITE NEGATIVE 05/19/2017 1810   LEUKOCYTESUR Negative 07/19/2020 1458    Lab Results  Component Value Date   LABMICR See below: 07/19/2020   WBCUA 0-5 07/19/2020   LABEPIT 0-10 07/19/2020   MUCUS Present 07/19/2020   BACTERIA Few 07/19/2020    Pertinent Imaging: MRi 08/20/2020: Images reviewed and discussed with the patient Results for orders placed during the hospital  encounter of 09/29/15  DG Abd 1 View  Narrative CLINICAL DATA:  Ileus, lower abdominal pain 6 days  EXAM: ABDOMEN - 1 VIEW  COMPARISON:  10/26/2015 CT  FINDINGS: Adult nurse  within mildly prominent colon. Moderate stool. No obstruction or free air. No organomegaly or suspicious calcification. Rightward scoliosis in the lumbar spine with degenerative changes.  IMPRESSION: Moderate stool burden. Mild gaseous distention of the colon without evidence of obstruction.   Electronically Signed By: Rolm Baptise M.D. On: 10/01/2015 15:19  No results found for this or any previous visit.  No results found for this or any previous visit.  No results found for this or any previous visit.  Results for orders placed during the hospital encounter of 05/01/20  Ultrasound renal complete  Narrative CLINICAL DATA:  Kidney lesion seen on MRI.  EXAM: RENAL / URINARY TRACT ULTRASOUND COMPLETE  COMPARISON:  MRI of September 22, 2019.  Ultrasound of March 30, 2018.  FINDINGS: Right Kidney:  Renal measurements: 12.4 x 5.1 x 5.0 cm = volume: 168 mL. Echogenicity within normal limits. No mass or hydronephrosis visualized.  Left Kidney:  Renal measurements: 11.9 x 5.9 x 5.5 cm = volume: 202 mL. 6.5 x 3.7 x 3.1 cm complex cystic lesion is seen involving the upper pole. This corresponds in location to abnormality seen on prior MRI. This lesion appears to have thickened irregular walls and at least 1 thickened irregular septum. Echogenicity within normal limits. No hydronephrosis visualized.  Bladder:  Appears normal for degree of bladder distention.  Other:  None.  IMPRESSION: 6.5 x 3.7 x 3.1 cm complex cystic lesion is seen involving the upper pole of left kidney which corresponds to abnormality noted on MRI. It appears to be significantly enlarged compared to prior exam, and is highly concerning for neoplasm or malignancy. Consultation with urology is recommended. These results will  be called to the ordering clinician or representative by the Radiologist Assistant, and communication documented in the PACS or zVision Dashboard.   Electronically Signed By: Marijo Conception M.D. On: 05/02/2020 10:36  No results found for this or any previous visit.  No results found for this or any previous visit.  No results found for this or any previous visit.   Assessment & Plan:    1. Perinephric abscess -We will schedule for left perinephric abscess drain placement   No follow-ups on file.  Nicolette Bang, MD  [facility] [place]

## 2020-09-03 NOTE — Patient Instructions (Signed)
[email or web address]://[email or web address]/[email or web address]-disorders/bacterial-[email or web address]-infections/cutaneous-abscess">  Percutaneous Abscess Drain Placement Percutaneous abscess drain placement is a procedure to place a drain to remove a collection of infected fluid from inside the body (abscess). This is done by placing a thin needle under the skin and moving it into the abscess. A small tube (catheter) is then inserted and is left in place for a few days to continue to drain theabscess. Tell a health care provider about: Any allergies you have. All medicines you are taking, including vitamins, herbs, eye drops, creams, and over-the-counter medicines. Any problems you or family members have had with anesthetic medicines. Any blood disorders you have. Any surgeries you have had. Any medical conditions you have. Whether you are pregnant or may be pregnant. Any history of tobacco use or smoking. What are the risks? Generally, this is a safe procedure. However, problems may occur, including: Infection. Bleeding. Allergic reaction to medicines or materials used. Damage to nearby structures or organs. Blockage of the catheter, requiring placement of a new catheter. A need to repeat the procedure. Failure of the procedure to drain the abscess completely, requiring an open surgical procedure to drain the abscess. An open procedure is done through a larger incision. What happens before the procedure? Staying hydrated Follow instructions from your health care provider about hydration, which may include: Up to 2 hours before the procedure - you may continue to drink clear liquids, such as water, clear fruit juice, black coffee, and plain tea.  Eating and drinking restrictions Follow instructions from your health care provider about eating and drinking, which may include: 8 hours before the procedure - stop eating heavy meals or foods, such as meat, fried foods, or fatty foods. 6 hours before the procedure  - stop eating light meals or foods, such as toast or cereal. 6 hours before the procedure - stop drinking milk or drinks that contain milk. 2 hours before the procedure - stop drinking clear liquids. Medicines Ask your health care provider about: Changing or stopping your regular medicines. This is especially important if you are taking diabetes medicines or blood thinners. Taking medicines such as aspirin and ibuprofen. These medicines can thin your blood. Do not take these medicines unless your health care provider tells you to take them. Taking over-the-counter medicines, vitamins, herbs, and supplements. Tests You may have: Blood tests or urine tests. Imaging tests, such as an ultrasound, to check how large or deep your abscess is. General instructions Do not use any products that contain nicotine or tobacco for at least 4 weeks before the procedure. These products include cigarettes, e-cigarettes, and chewing tobacco. If you need help quitting, ask your health care provider. You may get a tetanus shot. Plan to have someone take you home from the hospital or clinic. Ask your health care provider what steps will be taken to help prevent infection. These may include: Removing hair at the procedure site. Washing skin with a germ-killing soap. Receiving antibiotic medicine. What happens during the procedure?  An IV will be inserted into one of your veins. You may be given one or more of the following: A medicine to help you relax (sedative). A medicine to numb the area where the catheter will be placed (local anesthetic). Placement of the catheter varies depending on where your abscess is located. A small incision will be made in your skin. A needle will be inserted under your skin and moved into the abscess. Images from ultrasound, X-rays, or a CT scan will be used to help guide the  needle to the abscess. A catheter will be inserted into your incision and moved underneath your skin until  it reaches the abscess. Images will be used to help guide the catheter to the abscess. After the catheter is in place, the needle will be removed. The catheter will be connected to a bag or drainage bulb outside of your body. The catheter will stay in place until the fluid has stopped draining and the infection is gone. The catheter may be held in place with stitches (sutures), skin glue, or adhesive strips. The procedure may vary among health care providers and hospitals. What happens after the procedure? Your blood pressure, heart rate, breathing rate, and blood oxygen level will be monitored until you leave the hospital or clinic. You may have some pain or nausea. Medicines will be available to help you. If you were given a sedative during the procedure, it can affect you for several hours. Do not drive or operate machinery until your health care provider says that it is safe. Summary An abscess is a collection of infected fluid inside the body. During the procedure, a catheter will be inserted into your incision and moved underneath your skin until it reaches the abscess. Images will be used to help guide the catheter to the abscess. The catheter will be left in place to continue to drain the abscess after the procedure. This information is not intended to replace advice given to you by your health care provider. Make sure you discuss any questions you have with your healthcare provider. Document Revised: 01/07/2019 Document Reviewed: 01/07/2019 Elsevier Patient Education  2022 [employer].

## 2020-09-04 ENCOUNTER — Encounter (HOSPITAL_COMMUNITY): Payer: Self-pay | Admitting: Radiology

## 2020-09-04 NOTE — Progress Notes (Signed)
Patient Demographics  Patient Name  T[place], Provencio Legal Sex  Female DOB  July 20, 1970 SSN  [id number] Address  [place] [address] Phone  [phone] (Home)  [phone] (Mobile) *Preferred*     RE: CT IMAGE GUIDED DRAINAGE BY PERCUTANEOUS CATHETER Received: Today Arne Cleveland, MD  [place] [place]   CT perinephric drain left w/ sedation   DDH         Previous Messages    ----- Message -----  From: Garth Bigness D  Sent: 09/04/2020  11:48 AM EDT  To: Ir Procedure Requests  Subject: CT IMAGE GUIDED DRAINAGE BY PERCUTANEOUS CAT*   Procedure:  CT IMAGE GUIDED DRAINAGE BY PERCUTANEOUS CATHETER   Reason:  Perinephric abscess, left perinephric abscess   History:  MR, CT Drain, [place] in computer   Provider:  Cleon Gustin   Provider Contact:  [phone]

## 2020-09-10 ENCOUNTER — Other Ambulatory Visit: Payer: Self-pay | Admitting: Internal Medicine

## 2020-09-11 ENCOUNTER — Other Ambulatory Visit: Payer: Self-pay

## 2020-09-11 ENCOUNTER — Ambulatory Visit (HOSPITAL_COMMUNITY)
Admission: RE | Admit: 2020-09-11 | Discharge: 2020-09-11 | Disposition: A | Discharge status: Home / Self Care | Payer: Medicare Other | Source: Ambulatory Visit | Attending: Urology | Admitting: Urology

## 2020-09-11 ENCOUNTER — Encounter (HOSPITAL_COMMUNITY): Payer: Self-pay

## 2020-09-11 ENCOUNTER — Other Ambulatory Visit: Payer: Self-pay | Admitting: Student

## 2020-09-11 DIAGNOSIS — I10 Essential (primary) hypertension: Secondary | ICD-10-CM | POA: Insufficient documentation

## 2020-09-11 DIAGNOSIS — Z79899 Other long term (current) drug therapy: Secondary | ICD-10-CM | POA: Insufficient documentation

## 2020-09-11 DIAGNOSIS — Z7984 Long term (current) use of oral hypoglycemic drugs: Secondary | ICD-10-CM | POA: Insufficient documentation

## 2020-09-11 DIAGNOSIS — Z87891 Personal history of nicotine dependence: Secondary | ICD-10-CM | POA: Insufficient documentation

## 2020-09-11 DIAGNOSIS — E119 Type 2 diabetes mellitus without complications: Secondary | ICD-10-CM | POA: Insufficient documentation

## 2020-09-11 DIAGNOSIS — N151 Renal and perinephric abscess: Secondary | ICD-10-CM | POA: Diagnosis not present

## 2020-09-11 DIAGNOSIS — Z888 Allergy status to other drugs, medicaments and biological substances status: Secondary | ICD-10-CM | POA: Insufficient documentation

## 2020-09-11 DIAGNOSIS — Z794 Long term (current) use of insulin: Secondary | ICD-10-CM | POA: Insufficient documentation

## 2020-09-11 DIAGNOSIS — Z886 Allergy status to analgesic agent status: Secondary | ICD-10-CM | POA: Diagnosis not present

## 2020-09-11 DIAGNOSIS — Z881 Allergy status to other antibiotic agents status: Secondary | ICD-10-CM | POA: Diagnosis not present

## 2020-09-11 DIAGNOSIS — Z91013 Allergy to seafood: Secondary | ICD-10-CM | POA: Insufficient documentation

## 2020-09-11 DIAGNOSIS — E7800 Pure hypercholesterolemia, unspecified: Secondary | ICD-10-CM | POA: Insufficient documentation

## 2020-09-11 DIAGNOSIS — N289 Disorder of kidney and ureter, unspecified: Secondary | ICD-10-CM

## 2020-09-11 LAB — CBC
HCT: 36.2 % (ref 36.0–46.0)
Hemoglobin: 12.1 g/dL (ref 12.0–15.0)
MCH: 29.7 pg (ref 26.0–34.0)
MCHC: 33.4 g/dL (ref 30.0–36.0)
MCV: 88.7 fL (ref 80.0–100.0)
Platelets: 240 10*3/uL (ref 150–400)
RBC: 4.08 MIL/uL (ref 3.87–5.11)
RDW: 13.5 % (ref 11.5–15.5)
WBC: 10.1 10*3/uL (ref 4.0–10.5)
nRBC: 0 % (ref 0.0–0.2)

## 2020-09-11 LAB — GLUCOSE, CAPILLARY: Glucose-Capillary: 222 mg/dL — ABNORMAL HIGH (ref 70–99)

## 2020-09-11 LAB — PROTIME-INR
INR: 1.2 (ref 0.8–1.2)
Prothrombin Time: 14.8 seconds (ref 11.4–15.2)

## 2020-09-11 MED ORDER — SODIUM CHLORIDE 0.9% FLUSH: 5.0000 mL | Freq: Three times a day (TID) | INTRAVENOUS | Status: DC

## 2020-09-11 MED ORDER — MIDAZOLAM HCL 2 MG/2ML IJ SOLN
INTRAMUSCULAR | Status: AC
  Filled 2020-09-11: qty 2

## 2020-09-11 MED ORDER — FENTANYL CITRATE (PF) 100 MCG/2ML IJ SOLN
INTRAMUSCULAR | Status: AC | PRN
  Administered 2020-09-11: 50 ug via INTRAVENOUS

## 2020-09-11 MED ORDER — SODIUM CHLORIDE 0.9 % IV SOLN
2.0000 g | INTRAVENOUS | Status: DC
  Filled 2020-09-11: qty 20

## 2020-09-11 MED ORDER — FENTANYL CITRATE (PF) 100 MCG/2ML IJ SOLN
INTRAMUSCULAR | Status: AC
  Filled 2020-09-11: qty 2

## 2020-09-11 MED ORDER — LIDOCAINE HCL 1 % IJ SOLN
INTRAMUSCULAR | Status: AC
  Filled 2020-09-11: qty 10

## 2020-09-11 MED ORDER — MIDAZOLAM HCL 2 MG/2ML IJ SOLN
INTRAMUSCULAR | Status: AC | PRN
  Administered 2020-09-11: 1 mg via INTRAVENOUS

## 2020-09-11 MED ORDER — SODIUM CHLORIDE 0.9 % IV SOLN: INTRAVENOUS | Status: DC

## 2020-09-11 NOTE — Progress Notes (Addendum)
Ambulated to the bathroom and in the hallway tol well. JP drain noted and charged

## 2020-09-11 NOTE — Procedures (Signed)
Interventional Radiology Procedure Note  Procedure: Image guided drain placement, left perinephric abscess.  93F pigtail drain.  Complications: None  EBL: None Sample: Culture sent  Recommendations: - Routine drain care, with sterile flushes, record output - follow up Cx - routine wound care - 1 hr dc home - drain education  Signed,  Dulcy Fanny. Earleen Newport, DO

## 2020-09-11 NOTE — Progress Notes (Signed)
Discharge instructions reviewed with pt and her mom both voice understanding. Drain teaching complete.

## 2020-09-11 NOTE — H&P (Signed)
Chief Complaint: Patient was seen in consultation today for image guided aspiration and drain placement for left perinephric abscess at the request of Aurora L  Referring Physician(s): Perham L  Supervising Physician: Corrie Mckusick  Patient Status: [facility] - Out-pt  History of Present Illness: Jamie Bell is a 50 y.o. female w/ PMH of hypertension, pneumonia, high cholesterol, DM, MRSA bacteremia, Karlene Lineman cirrhosis, PCOS, polysubstance abuse, anterior neck abscess, and possible peri-nephritic abscess seen on recent MRI abdomen on 06/13/2020, s/p aspiration of  the left perinephric abscess with IR on 07/02/2020.  Patient has been followed by Dr. Alyson Ingles and underwent follow-up MRI abdomen with and without contrast on 08/21/2020 which showed redemonstrated rim-enhancing perinephric fluid collection about the posterior superior pole of the left kidney.  IR was requested for image guided aspiration and possible drain placement for left perinephric abscess. Case was reviewed and approved for CT-guided left perinephric abscess drain drain placement by Dr. Vernard Gambles. Patient presents to [facility] IR today for the procedure.   Patient laying in bed, not in acute distress.  Reports back pain, which she usually takes Subaxone for her pain but she did not take it this morning.  Denise headache, fever, chills, shortness of breath, cough, chest pain, abdominal pain, nausea ,vomiting, and bleeding.   Past Medical History:  Diagnosis Date   Abscess    NECK ANTERIOR   Arthritis    Chronic back pain    Cirrhosis ([place])    Constipation    DDD (degenerative disc disease)    Decreased hearing on the right.   F/u by ENT in the past and no further management.   Depression    Diabetes mellitus    Dyspnea    with exertion   GERD (gastroesophageal reflux disease)    High cholesterol    Hypertension    took med. for HTN, several yrs. ago, no longer needs , seen by Dr. Lattie Haw last yr.  during a hosp. at [facility], for MRSA bacteremia    MRSA bacteremia 2014   Tx with Vancomycin   NASH (nonalcoholic steatohepatitis)    PCOS (polycystic ovarian syndrome)    Pneumonia 2016   Polysubstance abuse ([place])    Sciatica    HNP- Cerv. 7- T2    Past Surgical History:  Procedure Laterality Date   ANTERIOR CERVICAL DECOMP/DISCECTOMY FUSION N/A 05/28/2017   Procedure: ANTERIOR CERVICAL DECOMPRESSION CERVICAL THREE-FOUR;  Surgeon: Ashok Pall, MD;  Location: [place];  Service: Neurosurgery;  Laterality: N/A;  anterior   BACK SURGERY     BREAST EXCISIONAL BIOPSY Left    negative   BREAST SURGERY Left    blocked milk ducts    CARPAL TUNNEL RELEASE     CATARACT EXTRACTION W/PHACO Left 02/04/2015   Procedure: CATARACT EXTRACTION PHACO AND INTRAOCULAR LENS PLACEMENT ([place]);  Surgeon: [name], MD;  Location: [facility];  Service: Ophthalmology;  Laterality: Left;  CDE: 4.11   CATARACT EXTRACTION W/PHACO Right 05/24/2020   Procedure: CATARACT EXTRACTION PHACO AND INTRAOCULAR LENS PLACEMENT RIGHT EYE;  Surgeon: Baruch Goldmann, MD;  Location: [facility];  Service: Ophthalmology;  Laterality: Right;  right CDE=28.55   CHOLECYSTECTOMY     COLONOSCOPY WITH PROPOFOL N/A 11/12/2015   Dr. fields:Small internal hemorrhoids, 2 polyps removed (tubular adenomas) with noted congestion and edema in the left colon most consistent with NSAID use.  Next colonoscopy in 5 to 10 years.   ESOPHAGEAL BANDING N/A 11/12/2015   Procedure: ESOPHAGEAL BANDING;  Surgeon: [name], MD;  Location: [facility]  END[facility];  Service: Endoscopy;  Laterality: N/A;   ESOPHAGOGASTRODUODENOSCOPY  2007   Dr. Oneida Alar: mild antral erythema. Future procedures need to be done with Propofol per notes.    ESOPHAGOGASTRODUODENOSCOPY (EGD) WITH PROPOFOL N/A 11/12/2015   Dr. Oneida Alar: Normal esophagus with no varices.  Gastritis and duodenitis, biopsy without H. pylori, most consistent with NSAID/aspirin use.   ESOPHAGOGASTRODUODENOSCOPY (EGD) WITH  PROPOFOL N/A 10/17/2019   [place]: no esophageal varices. biopsies from esophagus c/w reflux esophagitis. she had portal hypertensive gastropathy. gastric bx negative for H.pylori and showed nonspecific reactive gastropathy. next EGD in 3 years.    INCISION AND DRAINAGE WOUND WITH TENDON REPAIR Right 07/03/2016   Procedure: INCISION AND DRAINAGE RIGHT LONG PROXIMAL INTERPHALANGEAL JOINT WOUND WITH  TENDON REPAIR AND CULTURES;  Surgeon: [name], MD;  Location: [place];  Service: Orthopedics;  Laterality: Right;   NECK SURGERY     PERIPHERALLY INSERTED CENTRAL CATHETER INSERTION Right 2007   for treatment with Vancomycin    POLYPECTOMY  11/12/2015   Procedure: POLYPECTOMY;  Surgeon: [name], MD;  [facility];  Service: Endoscopy;;  transverse colon polyp   POSTERIOR CERVICAL LAMINECTOMY N/A 11/22/2013   Procedure: POSTERIOR CERVICAL LAMINECTOMY C7/T1, T/1-2;  Surgeon: [name], MD;  Location: [place] NEURO ORS;  Service: Neurosurgery;  Laterality: N/A;  POSTERIOR CERVICAL LAMINECTOMY C7/T1, T/1-2   TEE WITHOUT CARDIOVERSION N/A 08/09/2012   Procedure: TRANSESOPHAGEAL ECHOCARDIOGRAM (TEE);  Surgeon: [name], MD;  [facility];  Service: Cardiovascular;  Laterality: N/A;    Allergies: Chantix [varenicline], Ciprofloxacin, Nsaids, Shrimp [shellfish allergy], and Tylenol [acetaminophen]  Medications: Prior to Admission medications   Medication Sig Start Date End Date Taking? Authorizing Provider  ACCU-CHEK GUIDE test strip USE TO TEST ONCE DAILY. 11/04/19  Yes [provider]  aspirin-acetaminophen-caffeine (EXCEDRIN MIGRAINE) [phone] MG tablet Take 2 tablets by mouth every 6 (six) hours as needed for headache.   Yes [provider]  Biotin w/ Vitamins C & E (HAIR/SKIN/NAILS PO) Take 1 tablet by mouth daily.   Yes [provider]  Blood Glucose Monitoring Suppl (ACCU-CHEK GUIDE) w/Device KIT USE AS DIRECTED.R 08/22/19  Yes  [provider]  Buprenorphine HCl-Naloxone HCl 8-2 MG FILM Place 3 Film under the tongue 3 (three) times daily. 05/18/17  Yes [provider]  busPIRone (BUSPAR) 10 MG tablet Take 10 mg by mouth 2 (two) times daily as needed (anxiety).  07/11/19  Yes [provider]  fluticasone furoate-vilanterol (BREO ELLIPTA) 100-25 MCG/INH AEPB Inhale 1 puff into the lungs daily. 09/12/19  Yes Chesley Mires, MD  furosemide (LASIX) 20 MG tablet Take 20 mg by mouth daily. 01/09/19  Yes [provider]  lamoTRIgine (LAMICTAL) 25 MG tablet Take 25 mg by mouth 2 (two) times daily.  08/08/19  Yes [provider]  LANTUS SOLOSTAR 100 UNIT/ML Solostar Pen Inject 26 Units into the skin at bedtime. 10/31/19  Yes [provider]  linaclotide Rolan Lipa) 290 MCG CAPS capsule Take 1 capsule (290 mcg total) by mouth daily before breakfast. 10/24/19  Yes Mahala Menghini, PA-C  liraglutide (VICTOZA) 18 MG/3ML SOPN Inject 1.2 mg into the skin every morning.   Yes [provider]  losartan (COZAAR) 50 MG tablet Take 50 mg by mouth daily. 03/29/20  Yes [provider]  Melatonin 10 MG TABS Take 10 mg by mouth at bedtime.   Yes [provider]  metFORMIN (GLUCOPHAGE) 1000 MG tablet Take 1,000 mg by mouth 2 (  two) times daily with a meal.   Yes [provider]  NARCAN 4 MG/0.1ML LIQD nasal spray kit Place 0.4 mg into the nose once.  04/03/19  Yes [provider]  pantoprazole (PROTONIX) 20 MG tablet Take 20 mg by mouth daily.   Yes [provider]  pravastatin (PRAVACHOL) 40 MG tablet Take 40 mg by mouth daily. 12/27/18  Yes [provider]  SPIRIVA HANDIHALER 18 MCG inhalation capsule Place 1 capsule into inhaler and inhale daily. 10/26/19  Yes [provider]  sulfamethoxazole-trimethoprim (BACTRIM DS) 800-160 MG tablet Take 1 tablet by mouth 2 (two) times daily. 07/19/20  Yes McKenzie, Candee Furbish, MD  SURE COMFORT PEN  NEEDLES 31G X 5 MM MISC INJECT AS DIRECTED.O 10/31/19  Yes [provider]  traZODone (DESYREL) 50 MG tablet Take 50-100 mg by mouth at bedtime as needed for sleep. 04/20/20  Yes [provider]  VRAYLAR 4.5 MG CAPS Take 4.5 mg by mouth daily. 05/07/20  Yes [provider]     Family History  Problem Relation Age of Onset   Hypertension Mother    Hypertension Father    Cirrhosis Father        Drank some alcohol   Diabetes Brother    Diabetes Other    Cirrhosis Paternal Aunt        No alcohol   Cirrhosis Paternal Aunt        No alcohol   Colon cancer Neg Hx     Social History   Socioeconomic History   Marital status: Divorced    Spouse name: Not on file   Number of children: Not on file   Years of education: Not on file   Highest education level: Not on file  Occupational History   Occupation: disable  Tobacco Use   Smoking status: Former    Packs/day: 1.50    Years: 30.00    Pack years: 45.00    Types: Cigarettes   Smokeless tobacco: Never   Tobacco comments:    smokes 1.5 packs per day  Vaping Use   Vaping Use: Every day  Substance and Sexual Activity   Alcohol use: Yes    Comment: social   Drug use: Yes    Types: IV, Marijuana    Comment: prior history of cocaine use; 09/06/19 marijuana now   Sexual activity: Yes    Birth control/protection: None  Other Topics Concern   Not on file  Social History Narrative   Not on file   Social Determinants of Health   Financial Resource Strain: Not on file  Food Insecurity: Not on file  Transportation Needs: Not on file  Physical Activity: Not on file  Stress: Not on file  Social Connections: Not on file     Review of Systems: A 12 point ROS discussed and pertinent positives are indicated in the HPI above.  All other systems are negative.   Vital Signs: BP 113/71   Pulse (!) 109   Temp 98 F (36.7 C) (Oral)   Ht _0  (1.651 m)   Wt 189 lb (85.7 kg)   LMP 01/04/2017 (Approximate)    SpO2 97%   BMI 31.45 kg/m   Physical Exam  Vitals and nursing note reviewed.  Constitutional:      General: He is not in acute distress.    Appearance: Normal appearance.  HENT:     Head: Normocephalic and atraumatic.     Mouth/Throat:     Mouth: Mucous membranes  are moist.     Pharynx: Oropharynx is clear.  Cardiovascular:     Rate and Rhythm: Normal rate and regular rhythm.     Pulses: Normal pulses.     Heart sounds: Normal heart sounds.  Pulmonary:     Effort: Pulmonary effort is normal.     Breath sounds: Normal breath sounds. No wheezing, rhonchi or rales.  Abdominal:     General: Bowel sounds are normal. There is no distension.     Palpations: Abdomen is soft.  Skin:    General: Skin is warm and dry.  Neurological:     Mental Status: He is alert and oriented to person, place, and time.  Psychiatric:        Mood and Affect: Mood normal.        Behavior: Behavior normal.    MD Evaluation Airway: WNL Heart: WNL Abdomen: WNL Chest/ Lungs: WNL ASA  Classification: 2 Mallampati/Airway Score: Two  Imaging: MR Abdomen W Wo Contrast  Result Date: 08/21/2020 CLINICAL DATA:  Follow-up left renal lesion and perinephric fluid collection, previously aspirated EXAM: MRI ABDOMEN WITHOUT AND WITH CONTRAST TECHNIQUE: Multiplanar multisequence MR imaging of the abdomen was performed both before and after the administration of intravenous contrast. CONTRAST:  1m GADAVIST GADOBUTROL 1 MMOL/ML IV SOLN COMPARISON:  CT guided aspiration, 07/02/2020, MR abdomen, 06/13/2020 FINDINGS: Lower chest: No acute findings. Hepatobiliary: No mass or other parenchymal abnormality identified. Status post cholecystectomy. Mild postoperative biliary ductal dilatation. Pancreas: No mass, inflammatory changes, or other parenchymal abnormality identified. Spleen:  Within normal limits in size and appearance. Adrenals/Urinary Tract: No masses identified. There is a redemonstrated, rim enhancing  perinephric fluid collection about the posterior superior pole of the left kidney, measuring approximately 6.1 x 3.8 cm, not substantially changed in size compared to prior MR, although with greatly diminished wall thickness and increased internal fluid component (series 15, image 52). No evidence of hydronephrosis. Stomach/Bowel: Visualized portions within the abdomen are unremarkable. Vascular/Lymphatic: Prominent left retroperitoneal lymph nodes measuring up to 1.5 x 1.3 cm, not significantly changed (series 15, image 64). Aortic atherosclerosis. No abdominal aortic aneurysm demonstrated. Other:  None. Musculoskeletal: No suspicious bone lesions identified. IMPRESSION: 1. There is a redemonstrated, rim enhancing perinephric fluid collection about the posterior superior pole of the left kidney, measuring approximately 6.1 x 3.8 cm, not substantially changed in size compared to prior MR, although with greatly diminished wall thickness and increased internal fluid component. Findings remain most consistent with a perinephric abscess or seroma, the presence or absence of persistent infection at this time not established by imaging. 2. Prominent left retroperitoneal lymph nodes, not significantly changed and likely reactive. 3. Status post cholecystectomy. Electronically Signed   By: AEddie CandleM.D.   On: 08/21/2020 10:11    Labs:  CBC: Recent Labs    07/02/20 0826 09/11/20 0849  WBC 5.2 10.1  HGB 13.8 12.1  HCT 42.8 36.2  PLT 141* 240    COAGS: Recent Labs    10/13/19 1000 07/02/20 0826 09/11/20 0849  INR 1.1 1.1 1.2    BMP: Recent Labs    09/12/19 1209 10/13/19 1000  NA 141 137  K 4.5 4.4  CL 99 106  CO2 24 22  GLUCOSE 216* 145*  BUN 31* 27*  CALCIUM 10.4* 10.4*  CREATININE 1.17* 0.99  GFRNONAA 55* >60  GFRAA 64 >60    LIVER FUNCTION TESTS: Recent Labs    09/12/19 1209 10/13/19 1000  BILITOT 0.3 0.3  AST 37 43*  ALT 39* 47*  ALKPHOS 142* 88  PROT 8.5 8.1  ALBUMIN  4.7 4.0    TUMOR MARKERS: No results for input(s): AFPTM, CEA, CA199, CHROMGRNA in the last 8760 hours.  Assessment and Plan: 50 y.o. female who is known to our service for previous aspiration of the left perinephric abscess on 07/02/2020.  Follow-up MRI abdomen with and without contrast on 08/21/2020 showed recurrent left perinephric abscess.  IR requested by urology for a image guided aspiration and possible drain placement for the left perinephric abscess. Case was reviewed and approved for CT-guided left perinephric abscess drain drain placement by Dr. Vernard Gambles. Patient presents to [facility] IR today for the procedure.  N.p.o. since midnight VS tachycardia with HR 109 CBC all within normal limit INR 1.2 Patient reports that she takes Excedrin which contains ASA 250 mg. Last taken on yesterday. Discussed with Dr. Earleen Newport, ok to proceed.   Risks and benefits discussed with the patient including bleeding, infection, damage to adjacent structures, bowel perforation/fistula connection, and sepsis.  All of the patient's questions were answered, patient is agreeable to proceed. Consent signed and in chart.   Thank you for this interesting consult.  I greatly enjoyed meeting Jamie Bell and look forward to participating in their care.  A copy of this report was sent to the requesting provider on this date.  Electronically Signed: Tera Mater, PA-C 09/11/2020, 10:00 AM   I spent a total of    25 Minutes in face to face in clinical consultation, greater than 50% of which was counseling/coordinating care for left perinephric abscess drain

## 2020-09-12 ENCOUNTER — Other Ambulatory Visit: Payer: Self-pay

## 2020-09-12 MED ORDER — STERILE WATER FOR IRRIGATION IR SOLN: 1000.0000 mL | Freq: Once | 3 refills | Status: DC

## 2020-09-14 ENCOUNTER — Other Ambulatory Visit: Payer: Self-pay

## 2020-09-14 ENCOUNTER — Emergency Department (HOSPITAL_COMMUNITY)
Admission: EM | Admit: 2020-09-14 | Discharge: 2020-09-14 | Disposition: A | Discharge status: Home / Self Care | Payer: Medicare Other | Attending: Emergency Medicine | Admitting: Emergency Medicine

## 2020-09-14 ENCOUNTER — Encounter (HOSPITAL_COMMUNITY): Payer: Self-pay

## 2020-09-14 DIAGNOSIS — Z5321 Procedure and treatment not carried out due to patient leaving prior to being seen by health care provider: Secondary | ICD-10-CM | POA: Insufficient documentation

## 2020-09-14 DIAGNOSIS — Z4800 Encounter for change or removal of nonsurgical wound dressing: Secondary | ICD-10-CM | POA: Diagnosis not present

## 2020-09-14 NOTE — ED Notes (Signed)
Called pt. X3. Pt. Not seen in ED lobby.

## 2020-09-14 NOTE — ED Triage Notes (Addendum)
Pt. Had a percutaneous abscess drain placed on 09/11/2020. Pt. States they noticed around the incision is red and swollen. Pt. States it did not look like that this morning.

## 2020-09-16 ENCOUNTER — Other Ambulatory Visit: Payer: Self-pay | Admitting: Urology

## 2020-09-16 DIAGNOSIS — N151 Renal and perinephric abscess: Secondary | ICD-10-CM

## 2020-09-16 LAB — AEROBIC/ANAEROBIC CULTURE W GRAM STAIN (SURGICAL/DEEP WOUND)

## 2020-09-17 ENCOUNTER — Other Ambulatory Visit: Payer: Self-pay

## 2020-09-17 MED ORDER — STERILE WATER FOR IRRIGATION IR SOLN: 1000.0000 mL | Freq: Three times a day (TID) | 3 refills | Status: DC

## 2020-09-18 ENCOUNTER — Ambulatory Visit (INDEPENDENT_AMBULATORY_CARE_PROVIDER_SITE_OTHER): Payer: Medicare Other | Admitting: Pulmonary Disease

## 2020-09-18 ENCOUNTER — Encounter: Payer: Self-pay | Admitting: Pulmonary Disease

## 2020-09-18 ENCOUNTER — Other Ambulatory Visit: Payer: Self-pay

## 2020-09-18 VITALS — BP 104/80 | HR 111 | Temp 98.3°F | Ht 65.0 in | Wt 194.0 lb

## 2020-09-18 DIAGNOSIS — Z716 Tobacco abuse counseling: Secondary | ICD-10-CM

## 2020-09-18 DIAGNOSIS — J449 Chronic obstructive pulmonary disease, unspecified: Secondary | ICD-10-CM | POA: Diagnosis not present

## 2020-09-18 NOTE — Patient Instructions (Signed)
Follow up in 1 year.

## 2020-09-18 NOTE — Progress Notes (Signed)
 Jamie Bell, Critical Care, and Sleep Medicine  Chief Complaint  Patient presents with   Follow-up    Patient has cut back on smoking, she states that her breahting is much better since last visit. No concerns    Constitutional:  BP 104/80 (BP Location: Left Arm, Patient Position: Sitting, Cuff Size: Normal)   Pulse (!) 111   Temp 98.3 F (36.8 C) (Oral)   Ht 5' 5" (1.651 m)   Wt 194 lb (88 kg)   LMP 01/04/2017 (Approximate)   SpO2 97%   BMI 32.28 kg/m   Past Medical History:  NASH with cirrhosis, GERD, Chronic pain, Depression, Diabetes, HTN, MRSA bacteremia 2014, Polycystic ovarian syndrome, Pneumonia 2016, Sciatica  Past Surgical History:  She  has a past surgical history that includes Back surgery; Cholecystectomy; Neck surgery; Carpal tunnel release; TEE without cardioversion (N/A, 08/09/2012); Peripherally inserted central catheter insertion (Right, 2007); Breast surgery (Left); Posterior cervical laminectomy (N/A, 11/22/2013); Cataract extraction w/PHACO (Left, 02/04/2015); Esophagogastroduodenoscopy (2007); Colonoscopy with propofol (N/A, 11/12/2015); Esophagogastroduodenoscopy (egd) with propofol (N/A, 11/12/2015); esophageal banding (N/A, 11/12/2015); polypectomy (11/12/2015); Incision and drainage wound with tendon repair (Right, 07/03/2016); Anterior cervical decomp/discectomy fusion (N/A, 05/28/2017); Breast excisional biopsy (Left); Esophagogastroduodenoscopy (egd) with propofol (N/A, 10/17/2019); and Cataract extraction w/PHACO (Right, 05/24/2020).  Brief Summary:  Jamie Bell is a 50 y.o. female smoker with COPD and chronic bronchitis.      Subjective:   She has lost about 15 lbs since last year.  She is down to two cigarettes in the morning, and vaps later in the day.  She is breathing better.  Not having cough, wheeze, sputum, or chest congestion.  Sleeping better.  Hasn't needed spiriva in months.  Uses breo intermittently.  Developed a renal infection and  has a drain in place.  Physical Exam:   Appearance - well kempt   ENMT - no sinus tenderness, no oral exudate, no LAN, Mallampati 2 airway, no stridor  Respiratory - equal breath sounds bilaterally, no wheezing or rales  CV - s1s2 regular rate and rhythm, no murmurs  Ext - no clubbing, no edema  Skin - no rashes  Psych - normal mood and affect   Bell testing:  PFT 11/15/12 >> FEV1 3.28 (109%), FEV1% 80, TLC 5.93 (117%), DLCO 73%  Chest Imaging:    Sleep Tests:    Cardiac Tests:  Echo 06/03/14 >> EF 55%  Social History:  She  reports that she has been smoking cigarettes. She has a 45.00 pack-year smoking history. She has never used smokeless tobacco. She reports current alcohol use. She reports current drug use. Drugs: IV and Marijuana.  Family History:  Her family history includes Cirrhosis in her father, paternal aunt, and paternal aunt; Diabetes in her brother and another family member; Hypertension in her father and mother.     Assessment/Plan:   COPD with chronic bronchitis. - improved symptoms with weight reduction and decreased smoking - prn breo and spiriva for now >> likely can d/c inhalers once she stops smoking completely  Tobacco abuse. - discussed strategies to help with complete smoking cessation - discussed concerns with vaping  Left perinephric abscess. - she has follow up with urology  NASH with cirrhosis. - followed by Dr. Charles Bell with [facility]  Time Spent Involved in Patient Care on Day of Examination:  31 minutes  Follow up:   Patient Instructions  Follow up in 1 year  Medication List:   Allergies as of 09/18/2020         Reactions   Chantix [varenicline] Other (See Comments)   Vivid nightmares   Ciprofloxacin Swelling   Nsaids Hives   Pt takes ibuprofen with no reaction   Shrimp [shellfish Allergy] Itching   Makes hands itch    Tylenol [acetaminophen] Other (See Comments)   Patient has cirrhosis  of the liver and this causes elevated levels        Medication List        Accurate as of September 18, 2020 11:55 AM. If you have any questions, ask your nurse or doctor.          Accu-Chek Guide test strip Generic drug: glucose blood USE TO TEST ONCE DAILY.   Accu-Chek Guide w/Device Kit USE AS DIRECTED.R   aspirin-acetaminophen-caffeine 250-250-65 MG tablet Commonly known as: EXCEDRIN MIGRAINE Take 2 tablets by mouth every 6 (six) hours as needed for headache.   Breo Ellipta 100-25 MCG/INH Aepb Generic drug: fluticasone furoate-vilanterol Inhale 1 puff into the lungs daily.   Buprenorphine HCl-Naloxone HCl 8-2 MG Film Place 3 Film under the tongue 3 (three) times daily.   busPIRone 10 MG tablet Commonly known as: BUSPAR Take 10 mg by mouth 2 (two) times daily as needed (anxiety).   furosemide 20 MG tablet Commonly known as: LASIX Take 20 mg by mouth daily.   HAIR/SKIN/NAILS PO Take 1 tablet by mouth daily.   lamoTRIgine 25 MG tablet Commonly known as: LAMICTAL Take 25 mg by mouth 2 (two) times daily.   Lantus SoloStar 100 UNIT/ML Solostar Pen Generic drug: insulin glargine Inject 26 Units into the skin at bedtime.   linaclotide 290 MCG Caps capsule Commonly known as: Linzess Take 1 capsule (290 mcg total) by mouth daily before breakfast.   liraglutide 18 MG/3ML Sopn Commonly known as: VICTOZA Inject 1.2 mg into the skin every morning.   losartan 50 MG tablet Commonly known as: COZAAR Take 50 mg by mouth daily.   Melatonin 10 MG Tabs Take 10 mg by mouth at bedtime.   metFORMIN 1000 MG tablet Commonly known as: GLUCOPHAGE Take 1,000 mg by mouth 2 (two) times daily with a meal.   Narcan 4 MG/0.1ML Liqd nasal spray kit Generic drug: naloxone Place 0.4 mg into the nose once.   pantoprazole 20 MG tablet Commonly known as: PROTONIX Take 20 mg by mouth daily.   pravastatin 40 MG tablet Commonly known as: PRAVACHOL Take 40 mg by mouth daily.    Spiriva HandiHaler 18 MCG inhalation capsule Generic drug: tiotropium Place 1 capsule into inhaler and inhale daily.   sterile water for irrigation Irrigate with 1,000 mLs as directed 3 (three) times daily.   sulfamethoxazole-trimethoprim 800-160 MG tablet Commonly known as: BACTRIM DS Take 1 tablet by mouth 2 (two) times daily.   Sure Comfort Pen Needles 31G X 5 MM Misc Generic drug: Insulin Pen Needle INJECT AS DIRECTED.O   traZODone 50 MG tablet Commonly known as: DESYREL Take 50-100 mg by mouth at bedtime as needed for sleep.   Vraylar 4.5 MG Caps Generic drug: Cariprazine HCl Take 4.5 mg by mouth daily.        Signature:   , MD Pegram Bell/Critical Care Pager - [phone] 09/18/2020, 11:55 AM           

## 2020-09-24 ENCOUNTER — Ambulatory Visit
Admission: RE | Admit: 2020-09-24 | Discharge: 2020-09-24 | Disposition: A | Discharge status: Home / Self Care | Payer: Medicare Other | Source: Ambulatory Visit | Attending: Urology | Admitting: Urology

## 2020-09-24 ENCOUNTER — Other Ambulatory Visit: Payer: Self-pay

## 2020-09-24 ENCOUNTER — Encounter: Payer: Self-pay | Admitting: *Deleted

## 2020-09-24 ENCOUNTER — Ambulatory Visit
Admission: RE | Admit: 2020-09-24 | Discharge: 2020-09-24 | Disposition: A | Discharge status: Home / Self Care | Payer: Medicare Other | Source: Ambulatory Visit | Attending: Student | Admitting: Student

## 2020-09-24 DIAGNOSIS — N151 Renal and perinephric abscess: Secondary | ICD-10-CM

## 2020-09-24 HISTORY — PX: IR RADIOLOGIST EVAL & MGMT: IMG5224

## 2020-09-24 MED ORDER — IOPAMIDOL (ISOVUE-300) INJECTION 61%
100.0000 mL | Freq: Once | INTRAVENOUS | Status: AC | PRN
  Administered 2020-09-24: 100 mL via INTRAVENOUS

## 2020-09-24 NOTE — Progress Notes (Signed)
Referring Physician(s): Matthews,Kacie Sue-Ellen  Chief Complaint: The patient is seen in follow up today s/p left perinephric abscess aspiration with drain placement 09/11/20  History of present illness:  Jamie Bell, 50 year old female, has a medical history significant for depression, DM, HTN, NASH cirrhosis, PCOS, polysubstance abuse and recurrent left perinephric abscess s/p aspiration with IR on 07/02/20 and repeat aspiration with drain placement on 09/11/20.   She presents today to the outpatient clinic for drain evaluation including CT imaging. She denies any fever, pain or discomfort. She is no longer taking any oral antibiotics and she reports a daily drain output of 10-15 ml. She flushes the drain twice daily with 5 ml NS.   Past Medical History:  Diagnosis Date   Abscess    NECK ANTERIOR   Arthritis    Chronic back pain    Cirrhosis ([place])    Constipation    DDD (degenerative disc disease)    Decreased hearing on the right.   F/u by ENT in the past and no further management.   Depression    Diabetes mellitus    Dyspnea    with exertion   GERD (gastroesophageal reflux disease)    High cholesterol    Hypertension    took med. for HTN, several yrs. ago, no longer needs , seen by Dr. Lattie Haw last yr. during a hosp. at [facility], for MRSA bacteremia    MRSA bacteremia 2014   Tx with Vancomycin   NASH (nonalcoholic steatohepatitis)    PCOS (polycystic ovarian syndrome)    Pneumonia 2016   Polysubstance abuse ([place])    Sciatica    HNP- Cerv. 7- T2    Past Surgical History:  Procedure Laterality Date   ANTERIOR CERVICAL DECOMP/DISCECTOMY FUSION N/A 05/28/2017   Procedure: ANTERIOR CERVICAL DECOMPRESSION CERVICAL THREE-FOUR;  Surgeon: Ashok Pall, MD;  Location: [place];  Service: Neurosurgery;  Laterality: N/A;  anterior   BACK SURGERY     BREAST EXCISIONAL BIOPSY Left    negative   BREAST SURGERY Left    blocked milk ducts    CARPAL TUNNEL RELEASE     CATARACT  EXTRACTION W/PHACO Left 02/04/2015   Procedure: CATARACT EXTRACTION PHACO AND INTRAOCULAR LENS PLACEMENT ([place]);  Surgeon: [name], MD;  Location: [facility];  Service: Ophthalmology;  Laterality: Left;  CDE: 4.11   CATARACT EXTRACTION W/PHACO Right 05/24/2020   Procedure: CATARACT EXTRACTION PHACO AND INTRAOCULAR LENS PLACEMENT RIGHT EYE;  Surgeon: Baruch Goldmann, MD;  Location: [facility];  Service: Ophthalmology;  Laterality: Right;  right CDE=28.55   CHOLECYSTECTOMY     COLONOSCOPY WITH PROPOFOL N/A 11/12/2015   Dr. fields:Small internal hemorrhoids, 2 polyps removed (tubular adenomas) with noted congestion and edema in the left colon most consistent with NSAID use.  Next colonoscopy in 5 to 10 years.   ESOPHAGEAL BANDING N/A 11/12/2015   Procedure: ESOPHAGEAL BANDING;  Surgeon: [name], MD;  [facility];  Service: Endoscopy;  Laterality: N/A;   ESOPHAGOGASTRODUODENOSCOPY  2007   Dr. Oneida Alar: mild antral erythema. Future procedures need to be done with Propofol per notes.    ESOPHAGOGASTRODUODENOSCOPY (EGD) WITH PROPOFOL N/A 11/12/2015   Dr. Oneida Alar: Normal esophagus with no varices.  Gastritis and duodenitis, biopsy without H. pylori, most consistent with NSAID/aspirin use.   ESOPHAGOGASTRODUODENOSCOPY (EGD) WITH PROPOFOL N/A 10/17/2019   [place]: no esophageal varices. biopsies from esophagus c/w reflux esophagitis. she had portal hypertensive gastropathy. gastric bx negative for H.pylori and showed nonspecific reactive gastropathy. next EGD in 3  years.    INCISION AND DRAINAGE WOUND WITH TENDON REPAIR Right 07/03/2016   Procedure: INCISION AND DRAINAGE RIGHT LONG PROXIMAL INTERPHALANGEAL JOINT WOUND WITH  TENDON REPAIR AND CULTURES;  Surgeon: [name], MD;  Location: [place];  Service: Orthopedics;  Laterality: Right;   IR RADIOLOGIST EVAL & MGMT  09/24/2020   NECK SURGERY     PERIPHERALLY INSERTED CENTRAL CATHETER INSERTION Right 2007   for treatment with Vancomycin     POLYPECTOMY  11/12/2015   Procedure: POLYPECTOMY;  Surgeon: [name], MD;  [facility];  Service: Endoscopy;;  transverse colon polyp   POSTERIOR CERVICAL LAMINECTOMY N/A 11/22/2013   Procedure: POSTERIOR CERVICAL LAMINECTOMY C7/T1, T/1-2;  Surgeon: [name], MD;  Location: [place] [facility];  Service: Neurosurgery;  Laterality: N/A;  POSTERIOR CERVICAL LAMINECTOMY C7/T1, T/1-2   TEE WITHOUT CARDIOVERSION N/A 08/09/2012   Procedure: TRANSESOPHAGEAL ECHOCARDIOGRAM (TEE);  Surgeon: [name], MD;  [facility];  Service: Cardiovascular;  Laterality: N/A;    Allergies: Chantix [varenicline], Ciprofloxacin, Nsaids, Shrimp [shellfish allergy], and Tylenol [acetaminophen]  Medications: Prior to Admission medications   Medication Sig Start Date End Date Taking? Authorizing Provider  ACCU-CHEK GUIDE test strip USE TO TEST ONCE DAILY. 11/04/19   [provider]  aspirin-acetaminophen-caffeine (EXCEDRIN MIGRAINE) [phone] MG tablet Take 2 tablets by mouth every 6 (six) hours as needed for headache.    [provider]  Biotin w/ Vitamins C & E (HAIR/SKIN/NAILS PO) Take 1 tablet by mouth daily.    [provider]  Blood Glucose Monitoring Suppl (ACCU-CHEK GUIDE) w/Device KIT USE AS DIRECTED.R 08/22/19   [provider]  Buprenorphine HCl-Naloxone HCl 8-2 MG FILM Place 3 Film under the tongue 3 (three) times daily. 05/18/17   [provider]  busPIRone (BUSPAR) 10 MG tablet Take 10 mg by mouth 2 (two) times daily as needed (anxiety).  07/11/19   [provider]  fluticasone furoate-vilanterol (BREO ELLIPTA) 100-25 MCG/INH AEPB Inhale 1 puff into the lungs daily. 09/12/19   Chesley Mires, MD  furosemide (LASIX) 20 MG tablet Take 20 mg by mouth daily. 01/09/19   [provider]  lamoTRIgine (LAMICTAL) 25 MG tablet Take 25 mg by mouth 2 (two) times daily.  08/08/19   [provider]  LANTUS SOLOSTAR 100  UNIT/ML Solostar Pen Inject 26 Units into the skin at bedtime. 10/31/19   [provider]  linaclotide Rolan Lipa) 290 MCG CAPS capsule Take 1 capsule (290 mcg total) by mouth daily before breakfast. 10/24/19   Mahala Menghini, PA-C  liraglutide (VICTOZA) 18 MG/3ML SOPN Inject 1.2 mg into the skin every morning.    [provider]  losartan (COZAAR) 50 MG tablet Take 50 mg by mouth daily. 03/29/20   [provider]  Melatonin 10 MG TABS Take 10 mg by mouth at bedtime.    [provider]  metFORMIN (GLUCOPHAGE) 1000 MG tablet Take 1,000 mg by mouth 2 (two) times daily with a meal.    [provider]  NARCAN 4 MG/0.1ML LIQD nasal spray kit Place 0.4 mg into the nose once.  04/03/19   [provider]  pantoprazole (PROTONIX) 20 MG tablet Take 20 mg by mouth daily.    [provider]  pravastatin (PRAVACHOL) 40 MG tablet Take 40 mg by mouth daily. 12/27/18   [provider]  SPIRIVA HANDIHALER 18 MCG inhalation capsule Place 1 capsule into inhaler and inhale daily. 10/26/19   [provider]  sulfamethoxazole-trimethoprim (BACTRIM DS) 800-160 MG tablet Take 1 tablet by mouth 2 (two) times daily. 07/19/20   McKenzie, Candee Furbish, MD  SURE COMFORT PEN NEEDLES 31G X 5 MM MISC INJECT AS DIRECTED.O 10/31/19   [provider]  traZODone (DESYREL) 50 MG tablet Take 50-100 mg by mouth at bedtime as needed for sleep. 04/20/20   [provider]  VRAYLAR 4.5 MG CAPS Take 4.5 mg by mouth daily. 05/07/20   [provider]  Water For Irrigation, Sterile (STERILE WATER FOR IRRIGATION) Irrigate with 1,000 mLs as directed 3 (three) times daily. 09/17/20   McKenzie, Candee Furbish, MD     Family History  Problem Relation Age of Onset   Hypertension Mother    Hypertension Father    Cirrhosis Father        Drank some alcohol   Diabetes Brother    Diabetes Other    Cirrhosis Paternal Aunt        No alcohol   Cirrhosis Paternal  Aunt        No alcohol   Colon cancer Neg Hx     Social History   Socioeconomic History   Marital status: Divorced    Spouse name: Not on file   Number of children: Not on file   Years of education: Not on file   Highest education level: Not on file  Occupational History   Occupation: disable  Tobacco Use   Smoking status: Some Days    Packs/day: 1.50    Years: 30.00    Pack years: 45.00    Types: Cigarettes   Smokeless tobacco: Never   Tobacco comments:    Smokes 2 cigarettes in the morning and then vapes the rest of the day 09/18/20 [place]  Vaping Use   Vaping Use: Every day  Substance and Sexual Activity   Alcohol use: Yes    Comment: social   Drug use: Yes    Types: IV, Marijuana    Comment: prior history of cocaine use; 09/06/19 marijuana now   Sexual activity: Yes    Birth control/protection: None  Other Topics Concern   Not on file  Social History Narrative   Not on file   Social Determinants of Health   Financial Resource Strain: Not on file  Food Insecurity: Not on file  Transportation Needs: Not on file  Physical Activity: Not on file  Stress: Not on file  Social Connections: Not on file     Vital Signs: LMP 01/04/2017 (Approximate)   Physical Exam Constitutional:      General: She is not in acute distress. Pulmonary:     Effort: Pulmonary effort is normal.  Abdominal:     Tenderness: There is no abdominal tenderness.     Comments: Left lower back drain to bulb. Approximately 10 ml of serous fluid in bulb. Suture and stat-lock intact. Dressing is clean and dry.   Musculoskeletal:        General: Normal range of motion.  Skin:    General: Skin is warm and dry.  Neurological:     Mental Status: She is alert and oriented to person, place, and time.    Imaging: CT ABDOMEN PELVIS W CONTRAST  Result Date: 09/24/2020 CLINICAL DATA:  50 year old female with history of perinephric abscess status post percutaneous drain placement on 09/11/2020.  Trace daily output from drain. EXAM: CT ABDOMEN AND PELVIS WITHOUT CONTRAST TECHNIQUE: Multidetector CT imaging of the abdomen and pelvis was performed following the standard protocol without IV contrast.  COMPARISON:  None. FINDINGS: Lower chest: No acute abnormality. Hepatobiliary: Nodular contour. No focal lesions. Parenchymal attenuation within normal limits. Status post cholecystectomy. No intra or extrahepatic biliary ductal dilation. Pancreas: Unremarkable. No pancreatic ductal dilatation or surrounding inflammatory changes. Spleen: Normal in size without focal abnormality. Adrenals/Urinary Tract: Adrenal glands are unremarkable. Significantly reduced size of previously visualized left upper polar perinephric fluid collection, now essentially immeasurable with trace fruits surrounding indwelling pigtail drain. No new fluid collections. Unchanged subcentimeter right upper pole simple cortical cysts. Kidneys are otherwise normal, without renal calculi, focal lesion, or hydronephrosis. Bladder is unremarkable. Stomach/Bowel: Stomach is within normal limits. Appendix appears normal. No evidence of bowel wall thickening, distention, or inflammatory changes. Vascular/Lymphatic: Aortic atherosclerosis. Similar-appearing multiple mildly enlarged retroperitoneal lymph nodes, most prominent on the left, likely reactive. Reproductive: Uterus and bilateral adnexa are unremarkable. Other: No abdominal wall hernia or abnormality. No abdominopelvic ascites. Musculoskeletal: Similar appearing moderate multilevel degenerative changes of the thoracolumbar spine, most pronounced from L3-S1, unchanged from comparison. No acute osseous abnormality or aggressive appearing osseous lesion. IMPRESSION: 1. Near complete interval resolution of previously visualized left perinephric fluid collection with well-positioned indwelling pigtail drain. 2. Morphologic changes of hepatic cirrhosis without evidence of hepatoma. PLAN: The  indwelling left perinephric pigtail drainage catheter was removed intact without complication in the IR drain clinic. Follow-up with Interventional Radiology as needed. Ruthann Cancer, MD [facility] Rad[facility] Electronically Signed   By: Ruthann Cancer M.D.   On: 09/24/2020 14:15   IR Radiologist Eval & Mgmt  Result Date: 09/24/2020 Please refer to notes tab for details about interventional procedure. (Op Note)   Labs:  CBC: Recent Labs    07/02/20 0826 09/11/20 0849  WBC 5.2 10.1  HGB 13.8 12.1  HCT 42.8 36.2  PLT 141* 240    COAGS: Recent Labs    10/13/19 1000 07/02/20 0826 09/11/20 0849  INR 1.1 1.1 1.2    BMP: Recent Labs    10/13/19 1000  NA 137  K 4.4  CL 106  CO2 22  GLUCOSE 145*  BUN 27*  CALCIUM 10.4*  CREATININE 0.99  GFRNONAA >60  GFRAA >60    LIVER FUNCTION TESTS: Recent Labs    10/13/19 1000  BILITOT 0.3  AST 43*  ALT 47*  ALKPHOS 88  PROT 8.1  ALBUMIN 4.0    Assessment:  Left perinephric abscess s/p aspiration with drain placement 09/11/20  CT imaging shows near complete resolution of the left perinephric fluid collection with a well-positioned pigtail drain. She is afebrile, without pain and reports scant daily output. The drain was removed without complication. The patient states she has a follow up with her urologist (Dr. Alyson Ingles) soon and she was encouraged to keep this appointment. She was instructed to wait 24 hours to shower and 7 days before submerging the site in water. She was also instructed to keep gauze/bandage over the site until the site has healed and to expect some drainage over the next few days. She knows to call the clinic if she develops any pain, swelling, tenderness or increased drainage at the site.  Signed: Theresa Duty, NP 09/24/2020, 5:05 PM   Please refer to Dr. Malachi Carl attestation of this note for management and plan.

## 2020-10-01 ENCOUNTER — Encounter: Payer: Self-pay | Admitting: *Deleted

## 2020-10-04 ENCOUNTER — Ambulatory Visit (INDEPENDENT_AMBULATORY_CARE_PROVIDER_SITE_OTHER): Payer: Medicare Other | Admitting: Urology

## 2020-10-04 ENCOUNTER — Encounter: Payer: Self-pay | Admitting: Urology

## 2020-10-04 ENCOUNTER — Other Ambulatory Visit: Payer: Self-pay

## 2020-10-04 VITALS — BP 93/63 | HR 99

## 2020-10-04 DIAGNOSIS — N151 Renal and perinephric abscess: Secondary | ICD-10-CM | POA: Diagnosis not present

## 2020-10-04 LAB — URINALYSIS, ROUTINE W REFLEX MICROSCOPIC
Bilirubin, UA: NEGATIVE
Glucose, UA: NEGATIVE
Ketones, UA: NEGATIVE
Leukocytes,UA: NEGATIVE
Nitrite, UA: NEGATIVE
Protein,UA: NEGATIVE
RBC, UA: NEGATIVE
Specific Gravity, UA: 1.02 (ref 1.005–1.030)
Urobilinogen, Ur: 0.2 mg/dL (ref 0.2–1.0)
pH, UA: 5.5 (ref 5.0–7.5)

## 2020-10-04 MED ORDER — SULFAMETHOXAZOLE-TRIMETHOPRIM 800-160 MG PO TABS: 1.0000 | ORAL_TABLET | Freq: Two times a day (BID) | ORAL | 0 refills | Status: DC

## 2020-10-04 MED ORDER — CULTURELLE DIGESTIVE HEALTH PO CAPS: 1.0000 | ORAL_CAPSULE | Freq: Every day | ORAL | 3 refills | Status: DC

## 2020-10-04 NOTE — Progress Notes (Signed)
Urological Symptom Review  Patient is experiencing the following symptoms: Frequent urination Get up at night to urinate   Review of Systems  Gastrointestinal (upper)  : Negative for upper GI symptoms  Gastrointestinal (lower) : Negative for lower GI symptoms  Constitutional : Negative for symptoms  Skin: Negative for skin symptoms  Eyes: Negative for eye symptoms  Ear/Nose/Throat : Negative for Ear/Nose/Throat symptoms  Hematologic/Lymphatic: Negative for Hematologic/Lymphatic symptoms  Cardiovascular : Negative for cardiovascular symptoms  Respiratory : Negative for respiratory symptoms  Endocrine: Negative for endocrine symptoms  Musculoskeletal: Negative for musculoskeletal symptoms  Neurological: Negative for neurological symptoms  Psychologic: Negative for psychiatric symptoms

## 2020-10-04 NOTE — Patient Instructions (Signed)
Percutaneous Abscess Drain Placement Percutaneous abscess drain placement is a procedure to place a drain to remove a collection of infected fluid from inside the body (abscess). This is done by placing a thin needle under the skin and moving it into the abscess. A small tube (catheter) is then inserted and is left in place for a few days to continue to drain the abscess. Tell a health care provider about: Any allergies you have. All medicines you are taking, including vitamins, herbs, eye drops, creams, and over-the-counter medicines. Any problems you or family members have had with anesthetic medicines. Any blood disorders you have. Any surgeries you have had. Any medical conditions you have. Whether you are pregnant or may be pregnant. Any history of tobacco use or smoking. What are the risks? Generally, this is a safe procedure. However, problems may occur, including: Infection. Bleeding. Allergic reaction to medicines or materials used. Damage to nearby structures or organs. Blockage of the catheter, requiring placement of a new catheter. A need to repeat the procedure. Failure of the procedure to drain the abscess completely, requiring an open surgical procedure to drain the abscess. An open procedure is done through a larger incision. What happens before the procedure? Staying hydrated Follow instructions from your health care provider about hydration, which may include: Up to 2 hours before the procedure - you may continue to drink clear liquids, such as water, clear fruit juice, black coffee, and plain tea.  Eating and drinking restrictions Follow instructions from your health care provider about eating and drinking, which may include: 8 hours before the procedure - stop eating heavy meals or foods, such as meat, fried foods, or fatty foods. 6 hours before the procedure - stop eating light meals or foods, such as toast or cereal. 6 hours before the procedure - stop drinking milk  or drinks that contain milk. 2 hours before the procedure - stop drinking clear liquids. Medicines Ask your health care provider about: Changing or stopping your regular medicines. This is especially important if you are taking diabetes medicines or blood thinners. Taking medicines such as aspirin and ibuprofen. These medicines can thin your blood. Do not take these medicines unless your health care provider tells you to take them. Taking over-the-counter medicines, vitamins, herbs, and supplements. Tests You may have: Blood tests or urine tests. Imaging tests, such as an ultrasound, to check how large or deep your abscess is. General instructions Do not use any products that contain nicotine or tobacco for at least 4 weeks before the procedure. These products include cigarettes, e-cigarettes, and chewing tobacco. If you need help quitting, ask your health care provider. You may get a tetanus shot. Plan to have someone take you home from the hospital or clinic. Ask your health care provider what steps will be taken to help prevent infection. These may include: Removing hair at the procedure site. Washing skin with a germ-killing soap. Receiving antibiotic medicine. What happens during the procedure?  An IV will be inserted into one of your veins. You may be given one or more of the following: A medicine to help you relax (sedative). A medicine to numb the area where the catheter will be placed (local anesthetic). Placement of the catheter varies depending on where your abscess is located. A small incision will be made in your skin. A needle will be inserted under your skin and moved into the abscess. Images from ultrasound, X-rays, or a CT scan will be used to help guide the needle  to the abscess. A catheter will be inserted into your incision and moved underneath your skin until it reaches the abscess. Images will be used to help guide the catheter to the abscess. After the catheter is  in place, the needle will be removed. The catheter will be connected to a bag or drainage bulb outside of your body. The catheter will stay in place until the fluid has stopped draining and the infection is gone. The catheter may be held in place with stitches (sutures), skin glue, or adhesive strips. The procedure may vary among health care providers and hospitals. What happens after the procedure? Your blood pressure, heart rate, breathing rate, and blood oxygen level will be monitored until you leave the hospital or clinic. You may have some pain or nausea. Medicines will be available to help you. If you were given a sedative during the procedure, it can affect you for several hours. Do not drive or operate machinery until your health care provider says that it is safe. Summary An abscess is a collection of infected fluid inside the body. During the procedure, a catheter will be inserted into your incision and moved underneath your skin until it reaches the abscess. Images will be used to help guide the catheter to the abscess. The catheter will be left in place to continue to drain the abscess after the procedure. This information is not intended to replace advice given to you by your health care provider. Make sure you discuss any questions you have with your health care provider. Document Revised: 01/07/2019 Document Reviewed: 01/07/2019 Elsevier Patient Education  2022 [employer].

## 2020-10-04 NOTE — Progress Notes (Signed)
10/04/2020 11:17 AM   Jamie Bell 09-21-1970 [id number]  Referring provider: Alfonse Flavors, MD [address] [place],  [place] [id number]  Followup renal abscess   HPI: Jamie Bell is a 50yo here for followup for left perinephric abscess. She underwent drain placement 8/17 and drain removal 8/30. She finished her course of bactrim. She denies any left flank pain. No fevers. No LUTS.    PMH: Past Medical History:  Diagnosis Date   Abscess    NECK ANTERIOR   Arthritis    Chronic back pain    Cirrhosis ([place])    Constipation    DDD (degenerative disc disease)    Decreased hearing on the right.   F/u by ENT in the past and no further management.   Depression    Diabetes mellitus    Dyspnea    with exertion   GERD (gastroesophageal reflux disease)    High cholesterol    Hypertension    took med. for HTN, several yrs. ago, no longer needs , seen by Dr. Lattie Haw last yr. during a hosp. at [facility], for MRSA bacteremia    MRSA bacteremia 2014   Tx with Vancomycin   NASH (nonalcoholic steatohepatitis)    PCOS (polycystic ovarian syndrome)    Pneumonia 2016   Polysubstance abuse ([place])    Sciatica    HNP- Cerv. 7- T2    Surgical History: Past Surgical History:  Procedure Laterality Date   ANTERIOR CERVICAL DECOMP/DISCECTOMY FUSION N/A 05/28/2017   Procedure: ANTERIOR CERVICAL DECOMPRESSION CERVICAL THREE-FOUR;  Surgeon: Ashok Pall, MD;  Location: [place];  Service: Neurosurgery;  Laterality: N/A;  anterior   BACK SURGERY     BREAST EXCISIONAL BIOPSY Left    negative   BREAST SURGERY Left    blocked milk ducts    CARPAL TUNNEL RELEASE     CATARACT EXTRACTION W/PHACO Left 02/04/2015   Procedure: CATARACT EXTRACTION PHACO AND INTRAOCULAR LENS PLACEMENT ([place]);  Surgeon: [name], MD;  Location[facility];  Service: Ophthalmology;  Laterality: Left;  CDE: 4.11   CATARACT EXTRACTION W/PHACO Right 05/24/2020   Procedure: CATARACT EXTRACTION PHACO AND INTRAOCULAR LENS  PLACEMENT RIGHT EYE;  Surgeon: Baruch Goldmann, MD;  Location: [facility];  Service: Ophthalmology;  Laterality: Right;  right CDE=28.55   CHOLECYSTECTOMY     COLONOSCOPY WITH PROPOFOL N/A 11/12/2015   Dr. fields:Small internal hemorrhoids, 2 polyps removed (tubular adenomas) with noted congestion and edema in the left colon most consistent with NSAID use.  Next colonoscopy in 5 to 10 years.   ESOPHAGEAL BANDING N/A 11/12/2015   Procedure: ESOPHAGEAL BANDING;  Surgeon: [name], MD;  [facility];  Service: Endoscopy;  Laterality: N/A;   ESOPHAGOGASTRODUODENOSCOPY  2007   Dr. Oneida Alar: mild antral erythema. Future procedures need to be done with Propofol per notes.    ESOPHAGOGASTRODUODENOSCOPY (EGD) WITH PROPOFOL N/A 11/12/2015   Dr. Oneida Alar: Normal esophagus with no varices.  Gastritis and duodenitis, biopsy without H. pylori, most consistent with NSAID/aspirin use.   ESOPHAGOGASTRODUODENOSCOPY (EGD) WITH PROPOFOL N/A 10/17/2019   [place]: no esophageal varices. biopsies from esophagus c/w reflux esophagitis. she had portal hypertensive gastropathy. gastric bx negative for H.pylori and showed nonspecific reactive gastropathy. next EGD in 3 years.    INCISION AND DRAINAGE WOUND WITH TENDON REPAIR Right 07/03/2016   Procedure: INCISION AND DRAINAGE RIGHT LONG PROXIMAL INTERPHALANGEAL JOINT WOUND WITH  TENDON REPAIR AND CULTURES;  Surgeon: [name], MD;  Location: [place];  Service: Orthopedics;  Laterality: Right;   IR RADIOLOGIST EVAL & MGMT  09/24/2020   NECK SURGERY     PERIPHERALLY INSERTED CENTRAL CATHETER INSERTION Right 2007   for treatment with Vancomycin    POLYPECTOMY  11/12/2015   Procedure: POLYPECTOMY;  Surgeon: [name], MD;  [facility];  Service: Endoscopy;;  transverse colon polyp   POSTERIOR CERVICAL LAMINECTOMY N/A 11/22/2013   Procedure: POSTERIOR CERVICAL LAMINECTOMY C7/T1, T/1-2;  Surgeon: [name], MD;  Location: [place] [facility];  Service:  Neurosurgery;  Laterality: N/A;  POSTERIOR CERVICAL LAMINECTOMY C7/T1, T/1-2   TEE WITHOUT CARDIOVERSION N/A 08/09/2012   Procedure: TRANSESOPHAGEAL ECHOCARDIOGRAM (TEE);  Surgeon: [name], MD;  [facility];  Service: Cardiovascular;  Laterality: N/A;    Home Medications:  Allergies as of 10/04/2020       Reactions   Chantix [varenicline] Other (See Comments)   Vivid nightmares   Ciprofloxacin Swelling   Nsaids Hives   Pt takes ibuprofen with no reaction   Shrimp [shellfish Allergy] Itching   Makes hands itch    Tylenol [acetaminophen] Other (See Comments)   Patient has cirrhosis of the liver and this causes elevated levels        Medication List        Accurate as of October 04, 2020 11:17 AM. If you have any questions, ask your nurse or doctor.          Accu-Chek Guide test strip Generic drug: glucose blood USE TO TEST ONCE DAILY.   Accu-Chek Guide w/Device Kit USE AS DIRECTED.R   aspirin-acetaminophen-caffeine 250-250-65 MG tablet Commonly known as: EXCEDRIN MIGRAINE Take 2 tablets by mouth every 6 (six) hours as needed for headache.   Breo Ellipta 100-25 MCG/INH Aepb Generic drug: fluticasone furoate-vilanterol Inhale 1 puff into the lungs daily.   Buprenorphine HCl-Naloxone HCl 8-2 MG Film Place 3 Film under the tongue 3 (three) times daily.   busPIRone 10 MG tablet Commonly known as: BUSPAR Take 10 mg by mouth 2 (two) times daily as needed (anxiety).   Culturelle Digestive Health Caps Take 1 capsule by mouth daily. Started by: Nicolette Bang, MD   furosemide 20 MG tablet Commonly known as: LASIX Take 20 mg by mouth daily.   HAIR/SKIN/NAILS PO Take 1 tablet by mouth daily.   lamoTRIgine 25 MG tablet Commonly known as: LAMICTAL Take 25 mg by mouth 2 (two) times daily.   Lantus SoloStar 100 UNIT/ML Solostar Pen Generic drug: insulin glargine Inject 26 Units into the skin at bedtime.   linaclotide 290 MCG Caps  capsule Commonly known as: Linzess Take 1 capsule (290 mcg total) by mouth daily before breakfast.   liraglutide 18 MG/3ML Sopn Commonly known as: VICTOZA Inject 1.2 mg into the skin every morning.   losartan 50 MG tablet Commonly known as: COZAAR Take 50 mg by mouth daily.   Melatonin 10 MG Tabs Take 10 mg by mouth at bedtime.   metFORMIN 1000 MG tablet Commonly known as: GLUCOPHAGE Take 1,000 mg by mouth 2 (two) times daily with a meal.   Narcan 4 MG/0.1ML Liqd nasal spray kit Generic drug: naloxone Place 0.4 mg into the nose once.   pantoprazole 20 MG tablet Commonly known as: PROTONIX Take 20 mg by mouth daily.   pravastatin 40 MG tablet Commonly known as: PRAVACHOL Take 40 mg by mouth daily.   Spiriva HandiHaler 18 MCG inhalation capsule Generic drug: tiotropium Place 1 capsule into inhaler and inhale daily.   sterile water  for irrigation Irrigate with 1,000 mLs as directed 3 (three) times daily.   sulfamethoxazole-trimethoprim 800-160 MG tablet Commonly known as: BACTRIM DS Take 1 tablet by mouth 2 (two) times daily.   Sure Comfort Pen Needles 31G X 5 MM Misc Generic drug: Insulin Pen Needle INJECT AS DIRECTED.O   traZODone 50 MG tablet Commonly known as: DESYREL Take 50-100 mg by mouth at bedtime as needed for sleep.   Vraylar 4.5 MG Caps Generic drug: Cariprazine HCl Take 4.5 mg by mouth daily.        Allergies:  Allergies  Allergen Reactions   Chantix [Varenicline] Other (See Comments)    Vivid nightmares   Ciprofloxacin Swelling   Nsaids Hives    Pt takes ibuprofen with no reaction   Shrimp [Shellfish Allergy] Itching    Makes hands itch    Tylenol [Acetaminophen] Other (See Comments)    Patient has cirrhosis of the liver and this causes elevated levels    Family History: Family History  Problem Relation Age of Onset   Hypertension Mother    Hypertension Father    Cirrhosis Father        Drank some alcohol   Diabetes Brother     Diabetes Other    Cirrhosis Paternal Aunt        No alcohol   Cirrhosis Paternal Aunt        No alcohol   Colon cancer Neg Hx     Social History:  reports that she has been smoking cigarettes. She has a 45.00 pack-year smoking history. She has never used smokeless tobacco. She reports current alcohol use. She reports current drug use. Drugs: IV and Marijuana.  ROS: All other review of systems were reviewed and are negative except what is noted above in HPI  Physical Exam: BP 93/63   Pulse 99   LMP 01/04/2017 (Approximate)   Constitutional:  Alert and oriented, No acute distress. HEENT: [place] AT, moist mucus membranes.  Trachea midline, no masses. Cardiovascular: No clubbing, cyanosis, or edema. Respiratory: Normal respiratory effort, no increased work of breathing. GI: Abdomen is soft, nontender, nondistended, no abdominal masses GU: No CVA tenderness.  Lymph: No cervical or inguinal lymphadenopathy. Skin: No rashes, bruises or suspicious lesions. Neurologic: Grossly intact, no focal deficits, moving all 4 extremities. Psychiatric: Normal mood and affect.  Laboratory Data: Lab Results  Component Value Date   WBC 10.1 09/11/2020   HGB 12.1 09/11/2020   HCT 36.2 09/11/2020   MCV 88.7 09/11/2020   PLT 240 09/11/2020    Lab Results  Component Value Date   CREATININE 0.99 10/13/2019    No results found for: PSA  No results found for: TESTOSTERONE  Lab Results  Component Value Date   HGBA1C 7.6 (H) 08/13/2017    Urinalysis    Component Value Date/Time   COLORURINE YELLOW 05/19/2017 1810   APPEARANCEUR Clear 07/19/2020 1458   LABSPEC 1.017 05/19/2017 1810   PHURINE 6.0 05/19/2017 1810   GLUCOSEU Negative 07/19/2020 1458   HGBUR LARGE (A) 05/19/2017 1810   BILIRUBINUR Negative 07/19/2020 1458   KETONESUR negative 10/30/2019 1936   KETONESUR NEGATIVE 05/19/2017 1810   PROTEINUR 1+ (A) 07/19/2020 1458   PROTEINUR NEGATIVE 05/19/2017 1810   UROBILINOGEN 0.2  10/30/2019 1936   UROBILINOGEN 1.0 05/31/2014 0949   NITRITE Negative 07/19/2020 1458   NITRITE NEGATIVE 05/19/2017 1810   LEUKOCYTESUR Negative 07/19/2020 1458    Lab Results  Component Value Date   LABMICR See below: 07/19/2020   [facility]  0-5 07/19/2020   LABEPIT 0-10 07/19/2020   MUCUS Present 07/19/2020   BACTERIA Few 07/19/2020    Pertinent Imaging: CT 09/24/2020: Images reviewed and discussed with the patient  Results for orders placed during the hospital encounter of 09/29/15  DG Abd 1 View  Narrative CLINICAL DATA:  Ileus, lower abdominal pain 6 days  EXAM: ABDOMEN - 1 VIEW  COMPARISON:  10/26/2015 CT  FINDINGS: Gas within mildly prominent colon. Moderate stool. No obstruction or free air. No organomegaly or suspicious calcification. Rightward scoliosis in the lumbar spine with degenerative changes.  IMPRESSION: Moderate stool burden. Mild gaseous distention of the colon without evidence of obstruction.   Electronically Signed By: Rolm Baptise M.D. On: 10/01/2015 15:19  No results found for this or any previous visit.  No results found for this or any previous visit.  No results found for this or any previous visit.  Results for orders placed during the hospital encounter of 05/01/20  Ultrasound renal complete  Narrative CLINICAL DATA:  Kidney lesion seen on MRI.  EXAM: RENAL / URINARY TRACT ULTRASOUND COMPLETE  COMPARISON:  MRI of September 22, 2019.  Ultrasound of March 30, 2018.  FINDINGS: Right Kidney:  Renal measurements: 12.4 x 5.1 x 5.0 cm = volume: 168 mL. Echogenicity within normal limits. No mass or hydronephrosis visualized.  Left Kidney:  Renal measurements: 11.9 x 5.9 x 5.5 cm = volume: 202 mL. 6.5 x 3.7 x 3.1 cm complex cystic lesion is seen involving the upper pole. This corresponds in location to abnormality seen on prior MRI. This lesion appears to have thickened irregular walls and at least 1 thickened irregular septum.  Echogenicity within normal limits. No hydronephrosis visualized.  Bladder:  Appears normal for degree of bladder distention.  Other:  None.  IMPRESSION: 6.5 x 3.7 x 3.1 cm complex cystic lesion is seen involving the upper pole of left kidney which corresponds to abnormality noted on MRI. It appears to be significantly enlarged compared to prior exam, and is highly concerning for neoplasm or malignancy. Consultation with urology is recommended. These results will be called to the ordering clinician or representative by the Radiologist Assistant, and communication documented in the PACS or zVision Dashboard.   Electronically Signed By: Marijo Conception M.D. On: 05/02/2020 10:36  No results found for this or any previous visit.  No results found for this or any previous visit.  No results found for this or any previous visit.   Assessment & Plan:    1. Perinephric abscess -RTC 6 weeks with renal US - sulfamethoxazole-trimethoprim (BACTRIM DS) 800-160 MG tablet; Take 1 tablet by mouth 2 (two) times daily.  Dispense: 56 tablet; Refill: 0   No follow-ups on file.  Nicolette Bang, MD  [facility] [place]

## 2020-10-09 ENCOUNTER — Ambulatory Visit (HOSPITAL_COMMUNITY)
Admission: RE | Admit: 2020-10-09 | Discharge: 2020-10-09 | Disposition: A | Discharge status: Home / Self Care | Payer: Medicare Other | Source: Ambulatory Visit | Attending: Family Medicine | Admitting: Family Medicine

## 2020-10-09 ENCOUNTER — Other Ambulatory Visit: Payer: Self-pay

## 2020-10-09 DIAGNOSIS — Z1239 Encounter for other screening for malignant neoplasm of breast: Secondary | ICD-10-CM

## 2020-10-09 DIAGNOSIS — Z1231 Encounter for screening mammogram for malignant neoplasm of breast: Secondary | ICD-10-CM | POA: Insufficient documentation

## 2020-10-28 ENCOUNTER — Ambulatory Visit (INDEPENDENT_AMBULATORY_CARE_PROVIDER_SITE_OTHER): Payer: Medicare Other | Admitting: Gastroenterology

## 2020-10-28 ENCOUNTER — Encounter: Payer: Self-pay | Admitting: Gastroenterology

## 2020-10-28 ENCOUNTER — Other Ambulatory Visit: Payer: Self-pay

## 2020-10-28 VITALS — BP 99/69 | HR 94 | Temp 96.8°F | Ht 65.5 in | Wt 207.8 lb

## 2020-10-28 DIAGNOSIS — K7460 Unspecified cirrhosis of liver: Secondary | ICD-10-CM

## 2020-10-28 DIAGNOSIS — K219 Gastro-esophageal reflux disease without esophagitis: Secondary | ICD-10-CM | POA: Diagnosis not present

## 2020-10-28 DIAGNOSIS — K5900 Constipation, unspecified: Secondary | ICD-10-CM | POA: Diagnosis not present

## 2020-10-28 NOTE — Patient Instructions (Signed)
Continue pantoprazole and Linzess as before.  We will update labs and ultrasound in 02/2021.  Limit aspirin products as much as possible due to risk of ulcers.  Avoid all alcohol due to your cirrhosis. Return office visit in 04/2021.

## 2020-10-28 NOTE — Progress Notes (Signed)
Primary Care Physician: Zhou-Talbert, Elwyn Lade, MD  Primary Gastroenterologist:  Elon Alas. Abbey Chatters, DO   Chief Complaint  Patient presents with   Cirrhosis    HPI: Jamie Bell is a 50 y.o. female here for follow-up of well compensated [place] cirrhosis, GERD, constipation.  MELD Na 8 in 06/2020. Labs from 06/2020: AFP 3.6, INR 1, white blood cell count 6900, hemoglobin 13.6, hematocrit 41.9, platelets 159,000, creatinine 1.15, sodium 140, total bilirubin 0.2, alkaline phosphatase 114, AST 29, ALT 29, albumin 4.1.  History of lateral left kidney lesion followed Dr. Alyson Ingles.  Ultimately patient developed perinephric abscess requiring drainage. Last imaging via CT September 24, 2020: Nodular liver, no focal masses.  No intra or extrahepatic biliary ductal dilation.  Near complete interval resolution of previously visualized left perinephric fluid collection with well-positioned indwelling pigtail drain.  Drain removed today of CT.  Follow-up planned with Dr. Alyson Ingles later this month.  Taking Linzess as needed only. No heartburn. No abdominal pain. No melena, brbpr.  Feels much better since abscess treated.  Execdrin migraine for back pain as needed. No opioids for 2 years. Rare etoh.    EGD 09/2019: portal hypertensive gastropathy, gastric biopsy with nonspecific reactive gastropathy, no H.pylori, biopsies from esophagus c/w reflux esophagitis. No esophageal varices. Next EGD in 3 years or sooner if decompensates.    Colonoscopy and upper endoscopy 10/2015, no varices. Noted duodenitis/gastritis without H pylori, biopsy showed reactive gastropathy likely due to NSAID/ASA use. Small internal hemorrhoids, 2 polyps removed (tubular adenomas) with noted congestion and edema in left colon most c/w NSAIDs use. Next colonoscopy in 5-10 years.    Current Outpatient Medications  Medication Sig Dispense Refill   ACCU-CHEK GUIDE test strip USE TO TEST ONCE DAILY.      aspirin-acetaminophen-caffeine (EXCEDRIN MIGRAINE) 250-250-65 MG tablet Take 2 tablets by mouth every 6 (six) hours as needed for headache.     Blood Glucose Monitoring Suppl (ACCU-CHEK GUIDE) w/Device KIT USE AS DIRECTED.R     Buprenorphine HCl-Naloxone HCl 8-2 MG FILM Place 3 Film under the tongue 3 (three) times daily.  0   busPIRone (BUSPAR) 10 MG tablet Take 10 mg by mouth 2 (two) times daily as needed (anxiety).      fluticasone furoate-vilanterol (BREO ELLIPTA) 100-25 MCG/INH AEPB Inhale 1 puff into the lungs daily. (Patient taking differently: Inhale 1 puff into the lungs as needed.) 30 each 5   furosemide (LASIX) 20 MG tablet Take 20 mg by mouth daily.     lamoTRIgine (LAMICTAL) 25 MG tablet Take 25 mg by mouth 2 (two) times daily.      LANTUS SOLOSTAR 100 UNIT/ML Solostar Pen Inject 26 Units into the skin at bedtime.     linaclotide (LINZESS) 290 MCG CAPS capsule Take 1 capsule (290 mcg total) by mouth daily before breakfast. (Patient taking differently: Take 290 mcg by mouth as needed.) 30 capsule 5   liraglutide (VICTOZA) 18 MG/3ML SOPN Inject 1.2 mg into the skin every morning.     losartan (COZAAR) 50 MG tablet Take 50 mg by mouth daily.     Melatonin 10 MG TABS Take 10 mg by mouth at bedtime.     metFORMIN (GLUCOPHAGE) 1000 MG tablet Take 1,000 mg by mouth 2 (two) times daily with a meal.     NARCAN 4 MG/0.1ML LIQD nasal spray kit Place 0.4 mg into the nose once.      pantoprazole (PROTONIX) 20 MG tablet Take 20 mg by mouth daily.  pravastatin (PRAVACHOL) 40 MG tablet Take 40 mg by mouth daily.     SPIRIVA HANDIHALER 18 MCG inhalation capsule Place 1 capsule into inhaler and inhale daily.     sulfamethoxazole-trimethoprim (BACTRIM DS) 800-160 MG tablet Take 1 tablet by mouth 2 (two) times daily. 56 tablet 0   SURE COMFORT PEN NEEDLES 31G X 5 MM MISC INJECT AS DIRECTED.O     traZODone (DESYREL) 50 MG tablet Take 50-100 mg by mouth at bedtime as needed for sleep.     VRAYLAR 4.5  MG CAPS Take 4.5 mg by mouth daily.     Water For Irrigation, Sterile (STERILE WATER FOR IRRIGATION) Irrigate with 1,000 mLs as directed 3 (three) times daily. 1000 mL 3   No current facility-administered medications for this visit.    Allergies as of 10/28/2020 - Review Complete 10/28/2020  Allergen Reaction Noted   Chantix [varenicline] Other (See Comments) 05/28/2017   Ciprofloxacin Swelling 05/20/2005   Nsaids Hives 05/02/2012   Shrimp [shellfish allergy] Itching 05/19/2017   Tylenol [acetaminophen] Other (See Comments) 03/27/2017    ROS:  General: Negative for anorexia, weight loss, fever, chills,   weakness.  Some fatigue but improved ENT: Negative for hoarseness, difficulty swallowing , nasal congestion. CV: Negative for chest pain, angina, palpitations, dyspnea on exertion, peripheral edema.  Respiratory: Negative for dyspnea at rest, dyspnea on exertion, cough, sputum, wheezing.  GI: See history of present illness. GU:  Negative for dysuria, hematuria, urinary incontinence, urinary frequency, nocturnal urination.  Endo: Negative for unusual weight change.    Physical Examination:   BP 99/69   Pulse 94   Temp (!) 96.8 F (36 C) (Temporal)   Ht 5' 5.5" (1.664 m)   Wt 207 lb 12.8 oz (94.3 kg)   LMP 01/04/2017 (Approximate)   BMI 34.05 kg/m   General: Well-nourished, well-developed in no acute distress.  Eyes: No icterus. Mouth: masked Lungs: Clear to auscultation bilaterally.  Heart: Regular rate and rhythm, no murmurs rubs or gallops.  Abdomen: Bowel sounds are normal, nontender, nondistended, no   masses, no abdominal bruits or hernia , no rebound or guarding.  Liver edge easily palpable Extremities: No lower extremity edema. No clubbing or deformities. Neuro: Alert and oriented x 4   Skin: Warm and dry, no jaundice.   Psych: Alert and cooperative, normal mood and affect.  Labs:   See hpi Imaging Studies: MM 3D SCREEN BREAST BILATERAL  Result Date:  10/15/2020 CLINICAL DATA:  Screening. EXAM: DIGITAL SCREENING BILATERAL MAMMOGRAM WITH TOMOSYNTHESIS AND CAD TECHNIQUE: Bilateral screening digital craniocaudal and mediolateral oblique mammograms were obtained. Bilateral screening digital breast tomosynthesis was performed. The images were evaluated with computer-aided detection. COMPARISON:  Previous exam(s). ACR Breast Density Category b: There are scattered areas of fibroglandular density. FINDINGS: There are no findings suspicious for malignancy. IMPRESSION: No mammographic evidence of malignancy. A result letter of this screening mammogram will be mailed directly to the patient. RECOMMENDATION: Screening mammogram in one year. (Code:SM-B-01Y) BI-RADS CATEGORY  1: Negative. Electronically Signed   By: Dorise Bullion III M.D.   On: 10/15/2020 10:22     Assessment:  NASH cirrhosis: Has been well compensated.  Labs and hepatoma surveillance up-to-date.  We will plan for labs and ultrasound in February 2023 with follow-up in April 2023.  She can do virtual visit if needed.  GERD: Doing well on pantoprazole 20 mg daily.  Constipation: Doing well on Linzess as needed.  History of adenomatous colon polyps: Due at any time, given recent/ongoing  management of left perinephric abscess, will postpone until after her next office visit.   Plan: Continue pantoprazole and Linzess as before. Update liver labs and abdominal ultrasound in February 2023. Avoid all alcohol use. Limit aspirin containing products given risk of ulcers. Colonoscopy to be scheduled after next office visit. Return to the office in April 2023.

## 2020-11-06 ENCOUNTER — Ambulatory Visit (HOSPITAL_COMMUNITY)
Admission: RE | Admit: 2020-11-06 | Discharge: 2020-11-06 | Disposition: A | Discharge status: Home / Self Care | Payer: Medicare Other | Source: Ambulatory Visit | Attending: Urology | Admitting: Urology

## 2020-11-06 ENCOUNTER — Other Ambulatory Visit: Payer: Self-pay

## 2020-11-06 DIAGNOSIS — N151 Renal and perinephric abscess: Secondary | ICD-10-CM | POA: Diagnosis not present

## 2020-11-12 NOTE — Progress Notes (Signed)
Sent via mail

## 2020-11-13 ENCOUNTER — Ambulatory Visit (INDEPENDENT_AMBULATORY_CARE_PROVIDER_SITE_OTHER): Payer: Medicare Other | Admitting: Urology

## 2020-11-13 ENCOUNTER — Other Ambulatory Visit: Payer: Self-pay

## 2020-11-13 VITALS — BP 108/68 | HR 112 | Wt 207.6 lb

## 2020-11-13 DIAGNOSIS — N281 Cyst of kidney, acquired: Secondary | ICD-10-CM

## 2020-11-13 MED ORDER — FLUCONAZOLE 150 MG PO TABS: 150.0000 mg | ORAL_TABLET | Freq: Once | ORAL | 0 refills | Status: AC

## 2020-11-13 NOTE — Progress Notes (Signed)
Urological Symptom Review  Patient is experiencing the following symptoms: N/a   Review of Systems  Gastrointestinal (upper)  : Negative for upper GI symptoms  Gastrointestinal (lower) : Negative for lower GI symptoms  Constitutional : Negative for symptoms  Skin: Negative for skin symptoms  Eyes: Negative for eye symptoms  Ear/Nose/Throat : Negative for Ear/Nose/Throat symptoms  Hematologic/Lymphatic: Negative for Hematologic/Lymphatic symptoms  Cardiovascular : Negative for cardiovascular symptoms  Respiratory : Negative for respiratory symptoms  Endocrine: Negative for endocrine symptoms  Musculoskeletal: Negative for musculoskeletal symptoms  Neurological: Negative for neurological symptoms  Psychologic: Negative for psychiatric symptoms

## 2020-11-13 NOTE — Patient Instructions (Signed)
Urinary Tract Infection, Adult A urinary tract infection (UTI) is an infection of any part of the urinary tract. The urinary tract includes the kidneys, ureters, bladder, and urethra. These organs make, store, and get rid of urine in the body. An upper UTI affects the ureters and kidneys. A lower UTI affects the bladder and urethra. What are the causes? Most urinary tract infections are caused by bacteria in your genital area around your urethra, where urine leaves your body. These bacteria grow and cause inflammation of your urinary tract. What increases the risk? You are more likely to develop this condition if: You have a urinary catheter that stays in place. You are not able to control when you urinate or have a bowel movement (incontinence). You are female and you: Use a spermicide or diaphragm for birth control. Have low estrogen levels. Are pregnant. You have certain genes that increase your risk. You are sexually active. You take antibiotic medicines. You have a condition that causes your flow of urine to slow down, such as: An enlarged prostate, if you are female. Blockage in your urethra. A kidney stone. A nerve condition that affects your bladder control (neurogenic bladder). Not getting enough to drink, or not urinating often. You have certain medical conditions, such as: Diabetes. A weak disease-fighting system (immunesystem). Sickle cell disease. Gout. Spinal cord injury. What are the signs or symptoms? Symptoms of this condition include: Needing to urinate right away (urgency). Frequent urination. This may include small amounts of urine each time you urinate. Pain or burning with urination. Blood in the urine. Urine that smells bad or unusual. Trouble urinating. Cloudy urine. Vaginal discharge, if you are female. Pain in the abdomen or the lower back. You may also have: Vomiting or a decreased appetite. Confusion. Irritability or tiredness. A fever or  chills. Diarrhea. The first symptom in older adults may be confusion. In some cases, they may not have any symptoms until the infection has worsened. How is this diagnosed? This condition is diagnosed based on your medical history and a physical exam. You may also have other tests, including: Urine tests. Blood tests. Tests for STIs (sexually transmitted infections). If you have had more than one UTI, a cystoscopy or imaging studies may be done to determine the cause of the infections. How is this treated? Treatment for this condition includes: Antibiotic medicine. Over-the-counter medicines to treat discomfort. Drinking enough water to stay hydrated. If you have frequent infections or have other conditions such as a kidney stone, you may need to see a health care provider who specializes in the urinary tract (urologist). In rare cases, urinary tract infections can cause sepsis. Sepsis is a life-threatening condition that occurs when the body responds to an infection. Sepsis is treated in the hospital with IV antibiotics, fluids, and other medicines. Follow these instructions at home: Medicines Take over-the-counter and prescription medicines only as told by your health care provider. If you were prescribed an antibiotic medicine, take it as told by your health care provider. Do not stop using the antibiotic even if you start to feel better. General instructions Make sure you: Empty your bladder often and completely. Do not hold urine for long periods of time. Empty your bladder after sex. Wipe from front to back after urinating or having a bowel movement if you are female. Use each tissue only one time when you wipe. Drink enough fluid to keep your urine pale yellow. Keep all follow-up visits. This is important. Contact a health care provider  if: Your symptoms do not get better after 1-2 days. Your symptoms go away and then return. Get help right away if: You have severe pain in your  back or your lower abdomen. You have a fever or chills. You have nausea or vomiting. Summary A urinary tract infection (UTI) is an infection of any part of the urinary tract, which includes the kidneys, ureters, bladder, and urethra. Most urinary tract infections are caused by bacteria in your genital area. Treatment for this condition often includes antibiotic medicines. If you were prescribed an antibiotic medicine, take it as told by your health care provider. Do not stop using the antibiotic even if you start to feel better. Keep all follow-up visits. This is important. This information is not intended to replace advice given to you by your health care provider. Make sure you discuss any questions you have with your health care provider. Document Revised: 08/25/2019 Document Reviewed: 08/25/2019 Elsevier Patient Education  [place].

## 2020-11-13 NOTE — Progress Notes (Signed)
11/13/2020 1:38 PM   Jamie Bell Feb 07, 1970 [id number]  Referring provider: Alfonse Flavors, MD [address] [place],  [place] [address]  Followup left renal abscess   HPI: Jamie Bell is a 50yo here for followup for a left renal abscess. She has finished her course of bactrim. She denies any flank pain. No fevers. No LUTS. No dysuria or hematuria. No other complaints today. Renal US 10/12 shows resolution of the left renal abscess.    PMH: Past Medical History:  Diagnosis Date   Abscess    NECK ANTERIOR   Arthritis    Chronic back pain    Cirrhosis ([place])    Constipation    DDD (degenerative disc disease)    Decreased hearing on the right.   F/u by ENT in the past and no further management.   Depression    Diabetes mellitus    Dyspnea    with exertion   GERD (gastroesophageal reflux disease)    High cholesterol    Hypertension    took med. for HTN, several yrs. ago, no longer needs , seen by Dr. Lattie Haw last yr. during a hosp. at [facility], for MRSA bacteremia    MRSA bacteremia 2014   Tx with Vancomycin   NASH (nonalcoholic steatohepatitis)    PCOS (polycystic ovarian syndrome)    Pneumonia 2016   Polysubstance abuse ([place])    Sciatica    HNP- Cerv. 7- T2    Surgical History: Past Surgical History:  Procedure Laterality Date   ANTERIOR CERVICAL DECOMP/DISCECTOMY FUSION N/A 05/28/2017   Procedure: ANTERIOR CERVICAL DECOMPRESSION CERVICAL THREE-FOUR;  Surgeon: Ashok Pall, MD;  Location: [facility];  Service: Neurosurgery;  Laterality: N/A;  anterior   BACK SURGERY     BREAST EXCISIONAL BIOPSY Left    negative   BREAST SURGERY Left    blocked milk ducts    CARPAL TUNNEL RELEASE     CATARACT EXTRACTION W/PHACO Left 02/04/2015   Procedure: CATARACT EXTRACTION PHACO AND INTRAOCULAR LENS PLACEMENT ([place]);  Surgeon: [name], MD;  Location: [facility];  Service: Ophthalmology;  Laterality: Left;  CDE: 4.11   CATARACT EXTRACTION W/PHACO Right 05/24/2020   Procedure:  CATARACT EXTRACTION PHACO AND INTRAOCULAR LENS PLACEMENT RIGHT EYE;  Surgeon: Baruch Goldmann, MD;  Location: [facility];  Service: Ophthalmology;  Laterality: Right;  right CDE=28.55   CHOLECYSTECTOMY     COLONOSCOPY WITH PROPOFOL N/A 11/12/2015   Dr. fields:Small internal hemorrhoids, 2 polyps removed (tubular adenomas) with noted congestion and edema in the left colon most consistent with NSAID use.  Next colonoscopy in 5 to 10 years.   ESOPHAGEAL BANDING N/A 11/12/2015   Procedure: ESOPHAGEAL BANDING;  Surgeon: [name], MD;  [facility];  Service: Endoscopy;  Laterality: N/A;   ESOPHAGOGASTRODUODENOSCOPY  2007   Dr. Oneida Alar: mild antral erythema. Future procedures need to be done with Propofol per notes.    ESOPHAGOGASTRODUODENOSCOPY (EGD) WITH PROPOFOL N/A 11/12/2015   Dr. Oneida Alar: Normal esophagus with no varices.  Gastritis and duodenitis, biopsy without H. pylori, most consistent with NSAID/aspirin use.   ESOPHAGOGASTRODUODENOSCOPY (EGD) WITH PROPOFOL N/A 10/17/2019   [place]: no esophageal varices. biopsies from esophagus c/w reflux esophagitis. she had portal hypertensive gastropathy. gastric bx negative for H.pylori and showed nonspecific reactive gastropathy. next EGD in 3 years.    INCISION AND DRAINAGE WOUND WITH TENDON REPAIR Right 07/03/2016   Procedure: INCISION AND DRAINAGE RIGHT LONG PROXIMAL INTERPHALANGEAL JOINT WOUND WITH  TENDON REPAIR AND CULTURES;  Surgeon:  [name], MD;  Location: [address];  Service: Orthopedics;  Laterality: Right;   IR RADIOLOGIST EVAL & MGMT  09/24/2020   NECK SURGERY     PERIPHERALLY INSERTED CENTRAL CATHETER INSERTION Right 2007   for treatment with Vancomycin    POLYPECTOMY  11/12/2015   Procedure: POLYPECTOMY;  Surgeon: [name], MD;  [facility];  Service: Endoscopy;;  transverse colon polyp   POSTERIOR CERVICAL LAMINECTOMY N/A 11/22/2013   Procedure: POSTERIOR CERVICAL LAMINECTOMY C7/T1, T/1-2;  Surgeon: [name], MD;  Location: [facility];  Service: Neurosurgery;  Laterality: N/A;  POSTERIOR CERVICAL LAMINECTOMY C7/T1, T/1-2   TEE WITHOUT CARDIOVERSION N/A 08/09/2012   Procedure: TRANSESOPHAGEAL ECHOCARDIOGRAM (TEE);  Surgeon: [name], MD;  [facility];  Service: Cardiovascular;  Laterality: N/A;    Home Medications:  Allergies as of 11/13/2020       Reactions   Chantix [varenicline] Other (See Comments)   Vivid nightmares   Ciprofloxacin Swelling   Nsaids Hives   Pt takes ibuprofen with no reaction   Shrimp [shellfish Allergy] Itching   Makes hands itch    Tylenol [acetaminophen] Other (See Comments)   Patient has cirrhosis of the liver and this causes elevated levels        Medication List        Accurate as of November 13, 2020  1:38 PM. If you have any questions, ask your nurse or doctor.          Accu-Chek Guide test strip Generic drug: glucose blood USE TO TEST ONCE DAILY.   Accu-Chek Guide w/Device Kit USE AS DIRECTED.R   aspirin-acetaminophen-caffeine 250-250-65 MG tablet Commonly known as: EXCEDRIN MIGRAINE Take 2 tablets by mouth every 6 (six) hours as needed for headache.   Breo Ellipta 100-25 MCG/ACT Aepb Generic drug: fluticasone furoate-vilanterol Inhale 1 puff into the lungs daily. What changed:  when to take this reasons to take this   Buprenorphine HCl-Naloxone HCl 8-2 MG Film Place 3 Film under the tongue 3 (three) times daily.   busPIRone 10 MG tablet Commonly known as: BUSPAR Take 10 mg by mouth 2 (two) times daily as needed (anxiety).   furosemide 20 MG tablet Commonly known as: LASIX Take 20 mg by mouth daily.   lamoTRIgine 25 MG tablet Commonly known as: LAMICTAL Take 25 mg by mouth 2 (two) times daily.   Lantus SoloStar 100 UNIT/ML Solostar Pen Generic drug: insulin glargine Inject 26 Units into the skin at bedtime.   linaclotide 290 MCG Caps capsule Commonly known as: Linzess Take 1 capsule (290 mcg  total) by mouth daily before breakfast. What changed:  when to take this reasons to take this   liraglutide 18 MG/3ML Sopn Commonly known as: VICTOZA Inject 1.2 mg into the skin every morning.   losartan 50 MG tablet Commonly known as: COZAAR Take 50 mg by mouth daily.   Melatonin 10 MG Tabs Take 10 mg by mouth at bedtime.   metFORMIN 1000 MG tablet Commonly known as: GLUCOPHAGE Take 1,000 mg by mouth 2 (two) times daily with a meal.   Narcan 4 MG/0.1ML Liqd nasal spray kit Generic drug: naloxone Place 0.4 mg into the nose once.   pantoprazole 20 MG tablet Commonly known as: PROTONIX Take 20 mg by mouth daily.   pravastatin 40 MG tablet Commonly known as: PRAVACHOL Take 40 mg by mouth daily.   Spiriva HandiHaler 18 MCG inhalation capsule Generic drug: tiotropium Place 1 capsule into inhaler  and inhale daily.   sterile water for irrigation Irrigate with 1,000 mLs as directed 3 (three) times daily.   sulfamethoxazole-trimethoprim 800-160 MG tablet Commonly known as: BACTRIM DS Take 1 tablet by mouth 2 (two) times daily.   Sure Comfort Pen Needles 31G X 5 MM Misc Generic drug: Insulin Pen Needle INJECT AS DIRECTED.O   traZODone 50 MG tablet Commonly known as: DESYREL Take 50-100 mg by mouth at bedtime as needed for sleep.   Vraylar 4.5 MG Caps Generic drug: Cariprazine HCl Take 4.5 mg by mouth daily.        Allergies:  Allergies  Allergen Reactions   Chantix [Varenicline] Other (See Comments)    Vivid nightmares   Ciprofloxacin Swelling   Nsaids Hives    Pt takes ibuprofen with no reaction   Shrimp [Shellfish Allergy] Itching    Makes hands itch    Tylenol [Acetaminophen] Other (See Comments)    Patient has cirrhosis of the liver and this causes elevated levels    Family History: Family History  Problem Relation Age of Onset   Hypertension Mother    Hypertension Father    Cirrhosis Father        Drank some alcohol   Diabetes Brother     Diabetes Other    Cirrhosis Paternal Aunt        No alcohol   Cirrhosis Paternal Aunt        No alcohol   Colon cancer Neg Hx     Social History:  reports that she has been smoking e-cigarettes. She has never used smokeless tobacco. She reports current alcohol use. She reports current drug use. Drugs: IV and Marijuana.  ROS: All other review of systems were reviewed and are negative except what is noted above in HPI  Physical Exam: BP 108/68   Pulse (!) 112   Wt 207 lb 9.6 oz (94.2 kg)   LMP 01/04/2017 (Approximate)   BMI 34.02 kg/m   Constitutional:  Alert and oriented, No acute distress. HEENT: [place] AT, moist mucus membranes.  Trachea midline, no masses. Cardiovascular: No clubbing, cyanosis, or edema. Respiratory: Normal respiratory effort, no increased work of breathing. GI: Abdomen is soft, nontender, nondistended, no abdominal masses GU: No CVA tenderness.  Lymph: No cervical or inguinal lymphadenopathy. Skin: No rashes, bruises or suspicious lesions. Neurologic: Grossly intact, no focal deficits, moving all 4 extremities. Psychiatric: Normal mood and affect.  Laboratory Data: Lab Results  Component Value Date   WBC 10.1 09/11/2020   HGB 12.1 09/11/2020   HCT 36.2 09/11/2020   MCV 88.7 09/11/2020   PLT 240 09/11/2020    Lab Results  Component Value Date   CREATININE 0.99 10/13/2019    No results found for: PSA  No results found for: TESTOSTERONE  Lab Results  Component Value Date   HGBA1C 7.6 (H) 08/13/2017    Urinalysis    Component Value Date/Time   COLORURINE YELLOW 05/19/2017 1810   APPEARANCEUR Clear 10/04/2020 1136   LABSPEC 1.017 05/19/2017 1810   PHURINE 6.0 05/19/2017 1810   GLUCOSEU Negative 10/04/2020 1136   HGBUR LARGE (A) 05/19/2017 1810   BILIRUBINUR Negative 10/04/2020 1136   KETONESUR negative 10/30/2019 1936   KETONESUR NEGATIVE 05/19/2017 1810   PROTEINUR Negative 10/04/2020 1136   PROTEINUR NEGATIVE 05/19/2017 1810    UROBILINOGEN 0.2 10/30/2019 1936   UROBILINOGEN 1.0 05/31/2014 0949   NITRITE Negative 10/04/2020 1136   NITRITE NEGATIVE 05/19/2017 1810   LEUKOCYTESUR Negative 10/04/2020 1136    Lab  Results  Component Value Date   LABMICR Comment 10/04/2020   WBCUA 0-5 07/19/2020   LABEPIT 0-10 07/19/2020   MUCUS Present 07/19/2020   BACTERIA Few 07/19/2020    Pertinent Imaging: Renal US 11/06/2020: Images reviewed and discussed with the patient Results for orders placed during the hospital encounter of 09/29/15  DG Abd 1 View  Narrative CLINICAL DATA:  Ileus, lower abdominal pain 6 days  EXAM: ABDOMEN - 1 VIEW  COMPARISON:  10/26/2015 CT  FINDINGS: Gas within mildly prominent colon. Moderate stool. No obstruction or free air. No organomegaly or suspicious calcification. Rightward scoliosis in the lumbar spine with degenerative changes.  IMPRESSION: Moderate stool burden. Mild gaseous distention of the colon without evidence of obstruction.   Electronically Signed By: Rolm Baptise M.D. On: 10/01/2015 15:19  No results found for this or any previous visit.  No results found for this or any previous visit.  No results found for this or any previous visit.  Results for orders placed during the hospital encounter of 11/06/20  Ultrasound renal complete  Narrative CLINICAL DATA:  Follow-up left perinephric abscess  EXAM: RENAL / URINARY TRACT ULTRASOUND COMPLETE  COMPARISON:  CT abdomen pelvis 09/24/2020  FINDINGS: Right Kidney:  Renal measurements: 12.5 x 5.1 x 5.6 cm = volume: 185 mL. Echogenicity within normal limits. No mass or hydronephrosis visualized.  Left Kidney:  Renal measurements: 12.2 x 5.1 x 6.1 cm = volume: 196 mL. Echogenicity within normal limits. No mass or hydronephrosis visualized.  Bladder:  Appears normal for degree of bladder distention.  Other:  None.  IMPRESSION: Unremarkable sonographic appearance of the bilateral kidneys.  No definite residual perinephric abscess identified. No hydronephrosis.   Electronically Signed By: Audie Pinto M.D. On: 11/09/2020 09:21  No results found for this or any previous visit.  No results found for this or any previous visit.  No results found for this or any previous visit.   Assessment & Plan:    1. Acquired cyst of kidney -resolved after antibiotic therapy and drain placement. RTC 6 months with a renal US - Urinalysis, Routine w reflex microscopic   No follow-ups on file.  Nicolette Bang, MD  [facility] [place]

## 2020-11-14 LAB — URINALYSIS, ROUTINE W REFLEX MICROSCOPIC
Bilirubin, UA: NEGATIVE
Glucose, UA: NEGATIVE
Ketones, UA: NEGATIVE
Leukocytes,UA: NEGATIVE
Nitrite, UA: NEGATIVE
RBC, UA: NEGATIVE
Specific Gravity, UA: 1.02 (ref 1.005–1.030)
Urobilinogen, Ur: 0.2 mg/dL (ref 0.2–1.0)
pH, UA: 5.5 (ref 5.0–7.5)

## 2020-11-14 LAB — MICROSCOPIC EXAMINATION
RBC, Urine: NONE SEEN /hpf (ref 0–2)
Renal Epithel, UA: NONE SEEN /hpf
WBC, UA: NONE SEEN /hpf (ref 0–5)

## 2020-11-15 ENCOUNTER — Ambulatory Visit: Payer: Medicare Other | Admitting: Urology

## 2020-11-26 ENCOUNTER — Encounter: Payer: Self-pay | Admitting: Urology

## 2021-02-24 ENCOUNTER — Telehealth: Payer: Self-pay | Admitting: Gastroenterology

## 2021-02-24 NOTE — Telephone Encounter (Signed)
RECALL FOR ULTRASOUND 

## 2021-02-24 NOTE — Telephone Encounter (Signed)
Letter mailed

## 2021-03-03 ENCOUNTER — Telehealth: Payer: Self-pay | Admitting: Gastroenterology

## 2021-03-03 DIAGNOSIS — K7460 Unspecified cirrhosis of liver: Secondary | ICD-10-CM

## 2021-03-03 NOTE — Telephone Encounter (Signed)
Patient received letter to schedule her ultrasound

## 2021-03-03 NOTE — Telephone Encounter (Signed)
ERROR

## 2021-03-03 NOTE — Addendum Note (Signed)
Addended by: Zara Council C on: 03/03/2021 01:20 PM   Modules accepted: Orders

## 2021-03-03 NOTE — Telephone Encounter (Signed)
[place] abd ruq scheduled for 03/12/21 at 9:30am, arrive at 9:15am. NPO after midnight before test.  Called and informed pt of [place] appt. Letter mailed.

## 2021-03-06 ENCOUNTER — Other Ambulatory Visit: Payer: Self-pay | Admitting: Pulmonary Disease

## 2021-03-12 ENCOUNTER — Other Ambulatory Visit: Payer: Self-pay

## 2021-03-12 ENCOUNTER — Ambulatory Visit (HOSPITAL_COMMUNITY)
Admission: RE | Admit: 2021-03-12 | Discharge: 2021-03-12 | Disposition: A | Discharge status: Home / Self Care | Payer: Medicare Other | Source: Ambulatory Visit | Attending: Gastroenterology | Admitting: Gastroenterology

## 2021-03-12 DIAGNOSIS — K7460 Unspecified cirrhosis of liver: Secondary | ICD-10-CM | POA: Diagnosis present

## 2021-03-14 ENCOUNTER — Other Ambulatory Visit: Payer: Self-pay

## 2021-03-14 DIAGNOSIS — K7460 Unspecified cirrhosis of liver: Secondary | ICD-10-CM

## 2021-03-24 LAB — PROTIME-INR
INR: 1.1
Prothrombin Time: 10.8 s (ref 9.0–11.5)

## 2021-03-24 LAB — CBC WITH DIFFERENTIAL/PLATELET
Absolute Monocytes: 429 {cells}/uL (ref 200–950)
Basophils Absolute: 32 {cells}/uL (ref 0–200)
Basophils Relative: 0.5 %
Eosinophils Absolute: 109 {cells}/uL (ref 15–500)
Eosinophils Relative: 1.7 %
HCT: 43.2 % (ref 35.0–45.0)
Hemoglobin: 14.8 g/dL (ref 11.7–15.5)
Lymphs Abs: 1574 {cells}/uL (ref 850–3900)
MCH: 30.8 pg (ref 27.0–33.0)
MCHC: 34.3 g/dL (ref 32.0–36.0)
MCV: 90 fL (ref 80.0–100.0)
MPV: 11.6 fL (ref 7.5–12.5)
Monocytes Relative: 6.7 %
Neutro Abs: 4256 {cells}/uL (ref 1500–7800)
Neutrophils Relative %: 66.5 %
Platelets: 154 Thousand/uL (ref 140–400)
RBC: 4.8 Million/uL (ref 3.80–5.10)
RDW: 13.2 % (ref 11.0–15.0)
Total Lymphocyte: 24.6 %
WBC: 6.4 Thousand/uL (ref 3.8–10.8)

## 2021-03-24 LAB — COMPREHENSIVE METABOLIC PANEL WITH GFR
AG Ratio: 1.3 (calc) (ref 1.0–2.5)
ALT: 42 U/L — ABNORMAL HIGH (ref 6–29)
AST: 37 U/L — ABNORMAL HIGH (ref 10–35)
Albumin: 4.4 g/dL (ref 3.6–5.1)
Alkaline phosphatase (APISO): 106 U/L (ref 31–125)
BUN/Creatinine Ratio: 20 (calc) (ref 6–22)
BUN: 22 mg/dL (ref 7–25)
CO2: 23 mmol/L (ref 20–32)
Calcium: 9.9 mg/dL (ref 8.6–10.2)
Chloride: 105 mmol/L (ref 98–110)
Creat: 1.08 mg/dL — ABNORMAL HIGH (ref 0.50–0.99)
Globulin: 3.3 g/dL (ref 1.9–3.7)
Glucose, Bld: 98 mg/dL (ref 65–139)
Potassium: 4.3 mmol/L (ref 3.5–5.3)
Sodium: 138 mmol/L (ref 135–146)
Total Bilirubin: 0.5 mg/dL (ref 0.2–1.2)
Total Protein: 7.7 g/dL (ref 6.1–8.1)

## 2021-03-24 LAB — AFP TUMOR MARKER: AFP-Tumor Marker: 4.3 ng/mL

## 2021-04-27 NOTE — Progress Notes (Signed)
No  ? ? ? ?GI Office Note   ? ?Referring Provider: Alfonse Flavors,* ?Primary Care Physician:  Danna Hefty, DO  ?Primary Gastroenterologist: Elon Alas. Abbey Chatters, DO ? ? ?Chief Complaint  ? ?Chief Complaint  ?Patient presents with  ? Follow-up  ?  No current issues. States that she was told her enzymes was up and wanted to find out why.   ? ? ?History of Present Illness  ? ?Jamie Bell is a 51 y.o. female presenting today for follow up. Last seen in 10/2020. H/O NASH cirrhosis, GERD, constipation.  She also has history of adenomatous colon polyps, due for colonoscopy at any time (5-10 year follow up advised).  Previously placed on hold due to left perinephric abscess. ? ?Patient has a history of positive hepatitis C antibody dating back at least to 2013.  HCVRNA neg 2017, 2018, 2019.  She is immune to hepatitis A, contracted illness while in hospital.  Has not received hepatitis B vaccination as previously advised. ? 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  Ex-husband had hepatitis C required treatment.  Patient has never had hepatitis C treatment.  Patient states that she drinks socially at times, last alcohol consumption was 3 months ago, "a swallow of white liquor".  Prior to that she had had no alcohol in 6 months.  She does have a history of heavy alcohol abuse prior to 1994 when she received a DUI. ?  ?She feels good.  She did not realize how sick she felt until after she had her perinephric abscess treated.  Denies any abdominal bleeding.  Appetite is good.  No heartburn on pantoprazole.  No dysphagia.  Continues to struggle with constipation in the setting of opioid use.  Linzess 290 mcg daily is too strong.  She has been taking 5 over-the-counter stool softeners when needed.  Denies any blood in the stool or melena. ? ?She is concerned about her mildly elevated transaminases.  Intermittently elevated off and on for the past couple of years.  Her weight fluctuates quite a  bit.  In April 2022 she weighed 200 pounds.  In August 2022 she was down at 189 pounds but back up to 212 pounds today. ? ? ?RUQ U/S 02/2021: cirrhosis, stable.  ? ?Labs 02/2021 with mildly elevated AST/ALT 37/42. MELD Na 8. ? ?EGD 09/2019: portal hypertensive gastropathy, gastric biopsy with nonspecific reactive gastropathy, no H.pylori, biopsies from esophagus c/w reflux esophagitis. No esophageal varices. Next EGD in 3 years or sooner if decompensates.  ?  ?Colonoscopy and upper endoscopy 10/2015, no varices. Noted duodenitis/gastritis without H pylori, biopsy showed reactive gastropathy likely due to NSAID/ASA use. Small internal hemorrhoids, 2 polyps removed (tubular adenomas) with noted congestion and edema in left colon most c/w NSAIDs use. Next colonoscopy in 5-10 years.  ? ? ?Medications  ? ?Current Outpatient Medications  ?Medication Sig Dispense Refill  ? ACCU-CHEK GUIDE test strip USE TO TEST ONCE DAILY.    ? aspirin-acetaminophen-caffeine (EXCEDRIN MIGRAINE) 250-250-65 MG tablet Take 2 tablets by mouth every 6 (six) hours as needed for headache.    ? Blood Glucose Monitoring Suppl (ACCU-CHEK GUIDE) w/Device KIT USE AS DIRECTED.R    ? Buprenorphine HCl-Naloxone HCl 8-2 MG FILM Place 3 Film under the tongue 3 (three) times daily.  0  ? busPIRone (BUSPAR) 10 MG tablet Take 10 mg by mouth 2 (two) times daily as needed (anxiety).     ? fluticasone furoate-vilanterol (BREO ELLIPTA) 100-25 MCG/ACT AEPB  INHALE 1 PUFF INTO THE LUNGS ONCE DAILY. 60 each 6  ? furosemide (LASIX) 20 MG tablet Take 20 mg by mouth daily.    ? lamoTRIgine (LAMICTAL) 25 MG tablet Take 25 mg by mouth 2 (two) times daily.     ? LANTUS SOLOSTAR 100 UNIT/ML Solostar Pen Inject 26 Units into the skin at bedtime.    ? linaclotide (LINZESS) 290 MCG CAPS capsule Take 1 capsule (290 mcg total) by mouth daily before breakfast. (Patient taking differently: Take 290 mcg by mouth as needed.) 30 capsule 5  ? liraglutide (VICTOZA) 18 MG/3ML SOPN Inject  1.2 mg into the skin every morning.    ? losartan (COZAAR) 50 MG tablet Take 50 mg by mouth daily.    ? Melatonin 10 MG TABS Take 10 mg by mouth at bedtime.    ? metFORMIN (GLUCOPHAGE) 1000 MG tablet Take 1,000 mg by mouth 2 (two) times daily with a meal.    ? NARCAN 4 MG/0.1ML LIQD nasal spray kit Place 0.4 mg into the nose once.     ? pantoprazole (PROTONIX) 20 MG tablet Take 20 mg by mouth daily.    ? pravastatin (PRAVACHOL) 40 MG tablet Take 40 mg by mouth daily.    ? SPIRIVA HANDIHALER 18 MCG inhalation capsule Place 1 capsule into inhaler and inhale daily.    ? SURE COMFORT PEN NEEDLES 31G X 5 MM MISC INJECT AS DIRECTED.O    ? traZODone (DESYREL) 50 MG tablet Take 50-100 mg by mouth at bedtime as needed for sleep.    ? VRAYLAR 4.5 MG CAPS Take 4.5 mg by mouth daily.    ? ?No current facility-administered medications for this visit.  ? ? ?Allergies  ? ?Allergies as of 04/28/2021 - Review Complete 04/28/2021  ?Allergen Reaction Noted  ? Chantix [varenicline] Other (See Comments) 05/28/2017  ? Ciprofloxacin Swelling 05/20/2005  ? Nsaids Hives 05/02/2012  ? Shrimp [shellfish allergy] Itching 05/19/2017  ? Tylenol [acetaminophen] Other (See Comments) 03/27/2017  ? ?  ?Past Medical History  ? ?Past Medical History:  ?Diagnosis Date  ? Abscess   ? NECK ANTERIOR  ? Arthritis   ? Chronic back pain   ? Cirrhosis ([place])   ? Constipation   ? DDD (degenerative disc disease)   ? Decreased hearing on the right.  ? F/u by ENT in the past and no further management.  ? Depression   ? Diabetes mellitus   ? Dyspnea   ? with exertion  ? GERD (gastroesophageal reflux disease)   ? High cholesterol   ? Hypertension   ? took med. for HTN, several yrs. ago, no longer needs , seen by Dr. Lattie Haw last yr. during a hosp. at [facility], for MRSA bacteremia   ? MRSA bacteremia 2014  ? Tx with Vancomycin  ? NASH (nonalcoholic steatohepatitis)   ? PCOS (polycystic ovarian syndrome)   ? Pneumonia 2016  ? Polysubstance abuse ([place])   ? Sciatica   ?  HNP- Cerv. 7- T2  ? ? ?Past Surgical History  ? ?Past Surgical History:  ?Procedure Laterality Date  ? ANTERIOR CERVICAL DECOMP/DISCECTOMY FUSION N/A 05/28/2017  ? Procedure: ANTERIOR CERVICAL DECOMPRESSION CERVICAL THREE-FOUR;  Surgeon: Ashok Pall, MD;  Location: [place];  Service: Neurosurgery;  Laterality: N/A;  anterior  ? BACK SURGERY    ? BREAST EXCISIONAL BIOPSY Left   ? negative  ? BREAST SURGERY Left   ? blocked milk ducts   ? CARPAL TUNNEL RELEASE    ? CATARACT  EXTRACTION W/PHACO Left 02/04/2015  ? Procedure: CATARACT EXTRACTION PHACO AND INTRAOCULAR LENS PLACEMENT (IOC);  Surgeon: [name], MD;  [facility];  Service: Ophthalmology;  Laterality: Left;  CDE: 4.11  ? CATARACT EXTRACTION W/PHACO Right 05/24/2020  ? Procedure: CATARACT EXTRACTION PHACO AND INTRAOCULAR LENS PLACEMENT RIGHT EYE;  Surgeon: Baruch Goldmann, MD;  [facility]: [facility];  Service: Ophthalmology;  Laterality: Right;  right ?CDE=28.55  ? CHOLECYSTECTOMY    ? COLONOSCOPY WITH PROPOFOL N/A 11/12/2015  ? Dr. fields:Small internal hemorrhoids, 2 polyps removed (tubular adenomas) with noted congestion and edema in the left colon most consistent with NSAID use.  Next colonoscopy in 5 to 10 years.  ? ESOPHAGEAL BANDING N/A 11/12/2015  ? Procedure: ESOPHAGEAL BANDING;  Surgeon: [name], MD;  [facility];  Service: Endoscopy;  Laterality: N/A;  ? ESOPHAGOGASTRODUODENOSCOPY  2007  ? Dr. Oneida Alar: mild antral erythema. Future procedures need to be done with Propofol per notes.   ? ESOPHAGOGASTRODUODENOSCOPY (EGD) WITH PROPOFOL N/A 11/12/2015  ? Dr. Oneida Alar: Normal esophagus with no varices.  Gastritis and duodenitis, biopsy without H. pylori, most consistent with NSAID/aspirin use.  ? ESOPHAGOGASTRODUODENOSCOPY (EGD) WITH PROPOFOL N/A 10/17/2019  ? Carver: no esophageal varices. biopsies from esophagus c/w reflux esophagitis. she had portal hypertensive gastropathy. gastric bx negative for H.pylori and showed nonspecific reactive  gastropathy. next EGD in 3 years.   ? INCISION AND DRAINAGE WOUND WITH TENDON REPAIR Right 07/03/2016  ? Procedure: INCISION AND DRAINAGE RIGHT LONG PROXIMAL INTERPHALANGEAL JOINT WOUND WITH  TENDON REPAIR AND

## 2021-04-28 ENCOUNTER — Encounter: Payer: Self-pay | Admitting: Gastroenterology

## 2021-04-28 ENCOUNTER — Ambulatory Visit (INDEPENDENT_AMBULATORY_CARE_PROVIDER_SITE_OTHER): Payer: Medicare Other | Admitting: Gastroenterology

## 2021-04-28 VITALS — BP 118/70 | HR 83 | Temp 97.3°F | Ht 65.0 in | Wt 212.8 lb

## 2021-04-28 DIAGNOSIS — K5903 Drug induced constipation: Secondary | ICD-10-CM | POA: Diagnosis not present

## 2021-04-28 DIAGNOSIS — R7989 Other specified abnormal findings of blood chemistry: Secondary | ICD-10-CM | POA: Insufficient documentation

## 2021-04-28 DIAGNOSIS — K7460 Unspecified cirrhosis of liver: Secondary | ICD-10-CM

## 2021-04-28 MED ORDER — LINACLOTIDE 72 MCG PO CAPS: 72.0000 ug | ORAL_CAPSULE | Freq: Every day | ORAL | 0 refills | Status: DC

## 2021-04-28 NOTE — Patient Instructions (Addendum)
Please go for labs at your convenience.  ?Recommend Hepatitis B vaccination. You can ask your PCP or drug store to give to you. Otherwise the health department should be able to assist.  ?Take Linzess samples, either 71mg or 1452m once daily. Figure out which dose (7259mor 145m63m is best tolerated and let me know. I will send in prescription for preferred dose.  ?Continue pantoprazole daily.  ?Avoid all alcohol. ?Return office visit in six months. ?

## 2021-05-16 ENCOUNTER — Ambulatory Visit: Payer: Medicare Other | Admitting: Urology

## 2021-05-19 ENCOUNTER — Ambulatory Visit (HOSPITAL_COMMUNITY)
Admission: RE | Admit: 2021-05-19 | Discharge: 2021-05-19 | Disposition: A | Discharge status: Home / Self Care | Payer: Medicare Other | Source: Ambulatory Visit | Attending: Urology | Admitting: Urology

## 2021-05-19 DIAGNOSIS — N281 Cyst of kidney, acquired: Secondary | ICD-10-CM | POA: Insufficient documentation

## 2021-05-23 ENCOUNTER — Ambulatory Visit (INDEPENDENT_AMBULATORY_CARE_PROVIDER_SITE_OTHER): Payer: Medicare Other | Admitting: Urology

## 2021-05-23 ENCOUNTER — Encounter: Payer: Self-pay | Admitting: Urology

## 2021-05-23 VITALS — BP 94/64 | HR 109 | Ht 65.0 in | Wt 190.0 lb

## 2021-05-23 DIAGNOSIS — N281 Cyst of kidney, acquired: Secondary | ICD-10-CM

## 2021-05-23 DIAGNOSIS — N2889 Other specified disorders of kidney and ureter: Secondary | ICD-10-CM

## 2021-05-23 LAB — URINALYSIS, ROUTINE W REFLEX MICROSCOPIC
Bilirubin, UA: NEGATIVE
Glucose, UA: NEGATIVE
Ketones, UA: NEGATIVE
Leukocytes,UA: NEGATIVE
Nitrite, UA: NEGATIVE
RBC, UA: NEGATIVE
Specific Gravity, UA: >1.03 — ABNORMAL HIGH (ref 1.005–1.030)
Urobilinogen, Ur: 0.2 mg/dL (ref 0.2–1.0)
pH, UA: 5.5 (ref 5.0–7.5)

## 2021-05-23 NOTE — Patient Instructions (Signed)

## 2021-05-23 NOTE — Progress Notes (Signed)
? ?05/23/2021 ?12:32 PM  ? ?Jamie Bell ?1970-05-08 ?[id number] ? ?Referring provider: Alfonse Flavors, MD ?[address] ?[place],  [place] [address] ? ?Followup left renal abscess ? ? ?HPI: ?Ms Jamie Bell is a 51yo here for followup for left renal abscess. Renal US 05/19/2021 shows no residual left renal abscess. No flank pain. No significant LUTS. No other complaints today. ? ? ?PMH: ?Past Medical History:  ?Diagnosis Date  ? Abscess   ? NECK ANTERIOR  ? Arthritis   ? Chronic back pain   ? Cirrhosis ([place])   ? Constipation   ? DDD (degenerative disc disease)   ? Decreased hearing on the right.  ? F/u by ENT in the past and no further management.  ? Depression   ? Diabetes mellitus   ? Dyspnea   ? with exertion  ? GERD (gastroesophageal reflux disease)   ? High cholesterol   ? Hypertension   ? took med. for HTN, several yrs. ago, no longer needs , seen by Dr. Lattie Haw last yr. during a hosp. at [facility], for MRSA bacteremia   ? MRSA bacteremia 2014  ? Tx with Vancomycin  ? NASH (nonalcoholic steatohepatitis)   ? PCOS (polycystic ovarian syndrome)   ? Pneumonia 2016  ? Polysubstance abuse ([place])   ? Sciatica   ? HNP- Cerv. 7- T2  ? ? ?Surgical History: ?Past Surgical History:  ?Procedure Laterality Date  ? ANTERIOR CERVICAL DECOMP/DISCECTOMY FUSION N/A 05/28/2017  ? Procedure: ANTERIOR CERVICAL DECOMPRESSION CERVICAL THREE-FOUR;  Surgeon: Ashok Pall, MD;  Location: [place];  Service: Neurosurgery;  Laterality: N/A;  anterior  ? BACK SURGERY    ? BREAST EXCISIONAL BIOPSY Left   ? negative  ? BREAST SURGERY Left   ? blocked milk ducts   ? CARPAL TUNNEL RELEASE    ? CATARACT EXTRACTION W/PHACO Left 02/04/2015  ? Procedure: CATARACT EXTRACTION PHACO AND INTRAOCULAR LENS PLACEMENT (IOC);  Surgeon: [name], MD;  [facility];  Service: Ophthalmology;  Laterality: Left;  CDE: 4.11  ? CATARACT EXTRACTION W/PHACO Right 05/24/2020  ? Procedure: CATARACT EXTRACTION PHACO AND INTRAOCULAR LENS PLACEMENT RIGHT EYE;  Surgeon:  Baruch Goldmann, MD;  [facility];  Service: Ophthalmology;  Laterality: Right;  right ?CDE=28.55  ? CHOLECYSTECTOMY    ? COLONOSCOPY WITH PROPOFOL N/A 11/12/2015  ? Dr. fields:Small internal hemorrhoids, 2 polyps removed (tubular adenomas) with noted congestion and edema in the left colon most consistent with NSAID use.  Next colonoscopy in 5 to 10 years.  ? ESOPHAGEAL BANDING N/A 11/12/2015  ? Procedure: ESOPHAGEAL BANDING;  Surgeon: [name], MD;  [facility];  Service: Endoscopy;  Laterality: N/A;  ? ESOPHAGOGASTRODUODENOSCOPY  2007  ? Dr. Oneida Alar: mild antral erythema. Future procedures need to be done with Propofol per notes.   ? ESOPHAGOGASTRODUODENOSCOPY (EGD) WITH PROPOFOL N/A 11/12/2015  ? Dr. Oneida Alar: Normal esophagus with no varices.  Gastritis and duodenitis, biopsy without H. pylori, most consistent with NSAID/aspirin use.  ? ESOPHAGOGASTRODUODENOSCOPY (EGD) WITH PROPOFOL N/A 10/17/2019  ? Carver: no esophageal varices. biopsies from esophagus c/w reflux esophagitis. she had portal hypertensive gastropathy. gastric bx negative for H.pylori and showed nonspecific reactive gastropathy. next EGD in 3 years.   ? INCISION AND DRAINAGE WOUND WITH TENDON REPAIR Right 07/03/2016  ? Procedure: INCISION AND DRAINAGE RIGHT LONG PROXIMAL INTERPHALANGEAL JOINT WOUND WITH  TENDON REPAIR AND CULTURES;  Surgeon: [name], MD;  Location: [place];  Service: Orthopedics;  Laterality: Right;  ? IR RADIOLOGIST  EVAL & MGMT  09/24/2020  ? NECK SURGERY    ? PERIPHERALLY INSERTED CENTRAL CATHETER INSERTION Right 2007  ? for treatment with Vancomycin   ? POLYPECTOMY  11/12/2015  ? Procedure: POLYPECTOMY;  Surgeon: [name], MD;  [facility];  Service: Endoscopy;;  transverse colon polyp  ? POSTERIOR CERVICAL LAMINECTOMY N/A 11/22/2013  ? Procedure: POSTERIOR CERVICAL LAMINECTOMY C7/T1, T/1-2;  Surgeon: [name], MD;  Location: [place] [facility];  Service: Neurosurgery;  Laterality: N/A;   POSTERIOR CERVICAL LAMINECTOMY C7/T1, T/1-2  ? TEE WITHOUT CARDIOVERSION N/A 08/09/2012  ? Procedure: TRANSESOPHAGEAL ECHOCARDIOGRAM (TEE);  Surgeon: [name], MD;  [facility];  Service: Cardiovascular;  Laterality: N/A;  ? ? ?Home Medications:  ?Allergies as of 05/23/2021   ? ?   Reactions  ? Chantix [varenicline] Other (See Comments)  ? Vivid nightmares  ? Ciprofloxacin Swelling  ? Nsaids Hives  ? Pt takes ibuprofen with no reaction  ? Shrimp [shellfish Allergy] Itching  ? Makes hands itch   ? Tylenol [acetaminophen] Other (See Comments)  ? Patient has cirrhosis of the liver and this causes elevated levels  ? ?  ? ?  ?Medication List  ?  ? ?  ? Accurate as of May 23, 2021 12:32 PM. If you have any questions, ask your nurse or doctor.  ?  ?  ? ?  ? ?Accu-Chek Guide test strip ?Generic drug: glucose blood ?USE TO TEST ONCE DAILY. ?  ?Accu-Chek Guide w/Device Kit ?USE AS DIRECTED.R ?  ?aspirin-acetaminophen-caffeine 250-250-65 MG tablet ?Commonly known as: [place] ?Take 2 tablets by mouth every 6 (six) hours as needed for headache. ?  ?Breo Ellipta 100-25 MCG/ACT Aepb ?Generic drug: fluticasone furoate-vilanterol ?INHALE 1 PUFF INTO THE LUNGS ONCE DAILY. ?  ?Buprenorphine HCl-Naloxone HCl 8-2 MG Film ?Place 3 Film under the tongue 3 (three) times daily. ?  ?busPIRone 10 MG tablet ?Commonly known as: BUSPAR ?Take 10 mg by mouth 2 (two) times daily as needed (anxiety). ?  ?furosemide 20 MG tablet ?Commonly known as: LASIX ?Take 20 mg by mouth daily. ?  ?lamoTRIgine 25 MG tablet ?Commonly known as: LAMICTAL ?Take 25 mg by mouth 2 (two) times daily. ?  ?Lantus SoloStar 100 UNIT/ML Solostar Pen ?Generic drug: insulin glargine ?Inject 26 Units into the skin at bedtime. ?  ?linaclotide 72 MCG capsule ?Commonly known as: Linzess ?Take 1 capsule (72 mcg total) by mouth daily before breakfast. ?  ?liraglutide 18 MG/3ML Sopn ?Commonly known as: VICTOZA ?Inject 1.2 mg into the skin every  morning. ?  ?losartan 50 MG tablet ?Commonly known as: COZAAR ?Take 50 mg by mouth daily. ?  ?Melatonin 10 MG Tabs ?Take 10 mg by mouth at bedtime. ?  ?metFORMIN 1000 MG tablet ?Commonly known as: GLUCOPHAGE ?Take 1,000 mg by mouth 2 (two) times daily with a meal. ?  ?Narcan 4 MG/0.1ML Liqd nasal spray kit ?Generic drug: naloxone ?Place 0.4 mg into the nose once. ?  ?pantoprazole 20 MG tablet ?Commonly known as: PROTONIX ?Take 20 mg by mouth daily. ?  ?pravastatin 40 MG tablet ?Commonly known as: PRAVACHOL ?Take 40 mg by mouth daily. ?  ?Spiriva HandiHaler 18 MCG inhalation capsule ?Generic drug: tiotropium ?Place 1 capsule into inhaler and inhale daily. ?  ?Sure Comfort Pen Needles 31G X 5 MM Misc ?Generic drug: Insulin Pen Needle ?INJECT AS DIRECTED.O ?  ?traZODone 50 MG tablet ?Commonly known as: DESYREL ?Take 50-100 mg by mouth at bedtime as  needed for sleep. ?  ?Vraylar 4.5 MG Caps ?Generic drug: Cariprazine HCl ?Take 4.5 mg by mouth daily. ?  ? ?  ? ? ?Allergies:  ?Allergies  ?Allergen Reactions  ? Chantix [Varenicline] Other (See Comments)  ?  Vivid nightmares  ? Ciprofloxacin Swelling  ? Nsaids Hives  ?  Pt takes ibuprofen with no reaction  ? Shrimp [Shellfish Allergy] Itching  ?  Makes hands itch   ? Tylenol [Acetaminophen] Other (See Comments)  ?  Patient has cirrhosis of the liver and this causes elevated levels  ? ? ?Family History: ?Family History  ?Problem Relation Age of Onset  ? Hypertension Mother   ? Hypertension Father   ? Cirrhosis Father   ?     Drank some alcohol  ? Diabetes Brother   ? Diabetes Other   ? Cirrhosis Paternal Aunt   ?     No alcohol  ? Cirrhosis Paternal Aunt   ?     No alcohol  ? Colon cancer Neg Hx   ? ? ?Social History:  reports that she has been smoking e-cigarettes and cigarettes. She has been smoking an average of .5 packs per day. She has never used smokeless tobacco. She reports that she does not currently use alcohol. She reports current drug use. Drugs: Marijuana and  IV. ? ?ROS: ?All other review of systems were reviewed and are negative except what is noted above in HPI ? ?Physical Exam: ?BP 94/64   Pulse (!) 109   Ht _0  (1.651 m)   Wt 190 lb (86.2 kg)   LMP 01/04/2017 (

## 2021-06-26 ENCOUNTER — Encounter (HOSPITAL_COMMUNITY): Payer: Self-pay | Admitting: Emergency Medicine

## 2021-06-26 ENCOUNTER — Other Ambulatory Visit: Payer: Self-pay

## 2021-06-26 ENCOUNTER — Emergency Department (HOSPITAL_COMMUNITY)
Admission: EM | Admit: 2021-06-26 | Discharge: 2021-06-26 | Disposition: A | Discharge status: Home / Self Care | Payer: Medicare Other | Attending: Emergency Medicine | Admitting: Emergency Medicine

## 2021-06-26 DIAGNOSIS — M791 Myalgia, unspecified site: Secondary | ICD-10-CM | POA: Diagnosis not present

## 2021-06-26 DIAGNOSIS — Z7984 Long term (current) use of oral hypoglycemic drugs: Secondary | ICD-10-CM | POA: Diagnosis not present

## 2021-06-26 DIAGNOSIS — Z79899 Other long term (current) drug therapy: Secondary | ICD-10-CM | POA: Insufficient documentation

## 2021-06-26 DIAGNOSIS — R5381 Other malaise: Secondary | ICD-10-CM | POA: Insufficient documentation

## 2021-06-26 DIAGNOSIS — S70261A Insect bite (nonvenomous), right hip, initial encounter: Secondary | ICD-10-CM | POA: Diagnosis not present

## 2021-06-26 DIAGNOSIS — W57XXXA Bitten or stung by nonvenomous insect and other nonvenomous arthropods, initial encounter: Secondary | ICD-10-CM | POA: Diagnosis not present

## 2021-06-26 DIAGNOSIS — I10 Essential (primary) hypertension: Secondary | ICD-10-CM | POA: Diagnosis not present

## 2021-06-26 DIAGNOSIS — E119 Type 2 diabetes mellitus without complications: Secondary | ICD-10-CM | POA: Diagnosis not present

## 2021-06-26 MED ORDER — DOXYCYCLINE HYCLATE 100 MG PO CAPS: 100.0000 mg | ORAL_CAPSULE | Freq: Two times a day (BID) | ORAL | 0 refills | Status: DC

## 2021-06-26 MED ORDER — KETOROLAC TROMETHAMINE 10 MG PO TABS: 10.0000 mg | ORAL_TABLET | Freq: Four times a day (QID) | ORAL | 0 refills | Status: DC | PRN

## 2021-06-26 MED ORDER — KETOROLAC TROMETHAMINE 30 MG/ML IJ SOLN
30.0000 mg | Freq: Once | INTRAMUSCULAR | Status: AC
  Administered 2021-06-26: 30 mg via INTRAVENOUS
  Filled 2021-06-26: qty 1

## 2021-06-26 NOTE — ED Provider Notes (Signed)
[facility] EMERGENCY DEPARTMENT Provider Note   CSN: [id number] Arrival date & time: 06/26/21  1729     History  Chief Complaint  Patient presents with   Insect Bite    Jamie Bell is a 51 y.o. female who presents the emergency department for an insect bite.  Patient states that she believes she got bit by a tick 2 days ago.  She removed the tick yesterday, noticed what looked like a boil on her hip.  She had applied pressure to the area, and noted purulent drainage.  She is overall felt fatigued, with some generalized malaise and myalgias.  No fever.  No other bites or rashes noticed.   HPI     Home Medications Prior to Admission medications   Medication Sig Start Date End Date Taking? Authorizing Provider  doxycycline (VIBRAMYCIN) 100 MG capsule Take 1 capsule (100 mg total) by mouth 2 (two) times daily. 06/26/21  Yes Ivori Storr T, PA-C  ketorolac (TORADOL) 10 MG tablet Take 1 tablet (10 mg total) by mouth every 6 (six) hours as needed. 06/26/21  Yes Maddyx Vallie T, PA-C  ACCU-CHEK GUIDE test strip USE TO TEST ONCE DAILY. 11/04/19   [provider]  aspirin-acetaminophen-caffeine (EXCEDRIN MIGRAINE) 7808630910 MG tablet Take 2 tablets by mouth every 6 (six) hours as needed for headache.    [provider]  Blood Glucose Monitoring Suppl (ACCU-CHEK GUIDE) w/Device KIT USE AS DIRECTED.R 08/22/19   [provider]  Buprenorphine HCl-Naloxone HCl 8-2 MG FILM Place 3 Film under the tongue 3 (three) times daily. 05/18/17   [provider]  busPIRone (BUSPAR) 10 MG tablet Take 10 mg by mouth 2 (two) times daily as needed (anxiety).  07/11/19   [provider]  fluticasone furoate-vilanterol (BREO ELLIPTA) 100-25 MCG/ACT AEPB INHALE 1 PUFF INTO THE LUNGS ONCE DAILY. 03/06/21   Chesley Mires, MD  furosemide (LASIX) 20 MG tablet Take 20 mg by mouth daily. 01/09/19   [provider]  lamoTRIgine (LAMICTAL) 25 MG tablet Take 25 mg by  mouth 2 (two) times daily.  08/08/19   [provider]  LANTUS SOLOSTAR 100 UNIT/ML Solostar Pen Inject 26 Units into the skin at bedtime. 10/31/19   [provider]  linaclotide Rolan Lipa) 72 MCG capsule Take 1 capsule (72 mcg total) by mouth daily before breakfast. 04/28/21   Mahala Menghini, PA-C  liraglutide (VICTOZA) 18 MG/3ML SOPN Inject 1.2 mg into the skin every morning.    [provider]  losartan (COZAAR) 50 MG tablet Take 50 mg by mouth daily. 03/29/20   [provider]  Melatonin 10 MG TABS Take 10 mg by mouth at bedtime.    [provider]  metFORMIN (GLUCOPHAGE) 1000 MG tablet Take 1,000 mg by mouth 2 (two) times daily with a meal.    [provider]  NARCAN 4 MG/0.1ML LIQD nasal spray kit Place 0.4 mg into the nose once.  04/03/19   [provider]  pantoprazole (PROTONIX) 20 MG tablet Take 20 mg by mouth daily.    [provider]  pravastatin (PRAVACHOL) 40 MG tablet Take 40 mg by mouth daily. 12/27/18   [provider]  SPIRIVA HANDIHALER 18 MCG inhalation capsule Place 1 capsule into inhaler and inhale daily. 10/26/19   [provider]  SURE COMFORT PEN NEEDLES 31G X 5 MM MISC INJECT AS DIRECTED.O 10/31/19   [provider]  traZODone (DESYREL) 50 MG tablet Take 50-100 mg by mouth at bedtime as  needed for sleep. 04/20/20   [provider]  VRAYLAR 4.5 MG CAPS Take 4.5 mg by mouth daily. 05/07/20   [provider]      Allergies    Chantix [varenicline], Ciprofloxacin, Nsaids, Shrimp [shellfish allergy], and Tylenol [acetaminophen]    Review of Systems   Review of Systems  Constitutional:  Positive for fatigue. Negative for chills and fever.  Musculoskeletal:  Positive for myalgias.  Skin:  Positive for wound. Negative for rash.  All other systems reviewed and are negative.  Physical Exam Updated Vital Signs BP 137/81 (BP Location: Right Arm)   Pulse 97   Temp 97.9 F  (36.6 C) (Oral)   Resp 20   Ht 5' 5"  (1.651 m)   Wt 95.2 kg   LMP 01/04/2017 (Approximate)   SpO2 97%   BMI 34.91 kg/m  Physical Exam Vitals and nursing note reviewed.  Constitutional:      Appearance: Normal appearance.  HENT:     Head: Normocephalic and atraumatic.  Eyes:     Conjunctiva/sclera: Conjunctivae normal.  Pulmonary:     Effort: Pulmonary effort is normal. No respiratory distress.  Skin:    General: Skin is warm and dry.     Comments: Approximately 6 cm area of erythema and induration over the right hip, with mild discharge.  No obvious purulence or fluctuance.  No spreading erythema.  Consistent with infected insect bite.  Neurological:     Mental Status: She is alert.  Psychiatric:        Mood and Affect: Mood normal.        Behavior: Behavior normal.    ED Results / Procedures / Treatments   Labs (all labs ordered are listed, but only abnormal results are displayed) Labs Reviewed  ROCKY MTN SPOTTED FVR ABS PNL(IGG+IGM)  LYME DISEASE DNA BY PCR(BORRELIA BURG)    EKG None  Radiology No results found.  Procedures Procedures    Medications Ordered in ED Medications  ketorolac (TORADOL) 30 MG/ML injection 30 mg (30 mg Intravenous Given 06/26/21 2051)    ED Course/ Medical Decision Making/ A&P                           Medical Decision Making Amount and/or Complexity of Data Reviewed Labs: ordered.  Risk Prescription drug management.  This patient is a 51 y.o. female who presents to the ED for concern of tick bite.   Differential diagnoses prior to evaluation: Infected insect bite, lyme disease, [place] spotted fever  Past Medical History / Co-morbidities / Social History: Diabetes, PCOS, GERD, hypertension  Additional history: Chart reviewed. Pertinent results include: Patient reports allergy to NSAIDs, I see that she has previously had Toradol and tolerated it.  Physical Exam: Physical exam performed. The pertinent findings  include: Approximately 6 cm area of erythema and induration over the right hip, with mild discharge.   Disposition: After consideration of the diagnostic results and the patients response to treatment, I feel that is not requiring admission.  Will treat multiple etiologies.  She could have an infected tick bite, or could be developing Lyme disease or recommended spotted fever.  She does not have additional rash, but I am concerned with her generalized malaise and myalgias.  We will draw serologies and discharge her with doxycycline.  She understands to follow-up her results with her PCP.  Discussed reasons to return to the emergency department, and the patient is agreeable to the plan.  Final Clinical Impression(s) / ED Diagnoses Final diagnoses:  Bug bite with infection, initial encounter  Tick bite of right hip, initial encounter    Rx / DC Orders ED Discharge Orders          Ordered    doxycycline (VIBRAMYCIN) 100 MG capsule  2 times daily        06/26/21 2136    ketorolac (TORADOL) 10 MG tablet  Every 6 hours PRN        06/26/21 2136           Portions of this report may have been transcribed using voice recognition software. Every effort was made to ensure accuracy; however, inadvertent computerized transcription errors may be present.    Estill Cotta 06/26/21 2141    Daleen Bo, MD 06/27/21 [phone]

## 2021-06-26 NOTE — Discharge Instructions (Addendum)
You were seen in the emergency department today for tick bite.  As we discussed there could be several things going on.  I think that you had an infected tick bite, but I also have concern for tickborne illnesses.  Placing you on antibiotics, which should treat all the likely scenarios.  I am also giving a prescription for some anti-inflammatory medicine.  I see that you had Toradol in the past and tolerated it, this is the same medicine we gave you on the ER.  Please follow-up about your lab work with your PCP.  Return to the emergency department for any new or worsening symptoms.

## 2021-06-26 NOTE — ED Triage Notes (Signed)
Pt removed tick from right hip area, area currently red in color and has discharge.

## 2021-07-09 ENCOUNTER — Other Ambulatory Visit: Payer: Self-pay

## 2021-07-09 ENCOUNTER — Emergency Department (HOSPITAL_COMMUNITY)
Admission: EM | Admit: 2021-07-09 | Discharge: 2021-07-09 | Disposition: A | Discharge status: Home / Self Care | Payer: Medicare Other | Attending: Emergency Medicine | Admitting: Emergency Medicine

## 2021-07-09 ENCOUNTER — Encounter (HOSPITAL_COMMUNITY): Payer: Self-pay | Admitting: *Deleted

## 2021-07-09 DIAGNOSIS — I10 Essential (primary) hypertension: Secondary | ICD-10-CM | POA: Insufficient documentation

## 2021-07-09 DIAGNOSIS — Z79899 Other long term (current) drug therapy: Secondary | ICD-10-CM | POA: Diagnosis not present

## 2021-07-09 DIAGNOSIS — Z20822 Contact with and (suspected) exposure to covid-19: Secondary | ICD-10-CM | POA: Insufficient documentation

## 2021-07-09 DIAGNOSIS — Z7984 Long term (current) use of oral hypoglycemic drugs: Secondary | ICD-10-CM | POA: Diagnosis not present

## 2021-07-09 DIAGNOSIS — E119 Type 2 diabetes mellitus without complications: Secondary | ICD-10-CM | POA: Insufficient documentation

## 2021-07-09 DIAGNOSIS — R197 Diarrhea, unspecified: Secondary | ICD-10-CM | POA: Diagnosis present

## 2021-07-09 DIAGNOSIS — B349 Viral infection, unspecified: Secondary | ICD-10-CM

## 2021-07-09 LAB — SARS CORONAVIRUS 2 BY RT PCR: SARS Coronavirus 2 by RT PCR: NEGATIVE

## 2021-07-09 NOTE — Discharge Instructions (Signed)
Your COVID test is negative tonight.  It is possible you have a viral infection, however it appears that it is improving.  Rest and make sure you are drinking plenty of fluids.  Plan to see your doctor for recheck if your symptoms persist, return here for any worsening symptoms.

## 2021-07-09 NOTE — ED Triage Notes (Signed)
Pt requesting Covid test, loss of taste yesterday.  +diarrhea x 3 days.

## 2021-07-10 NOTE — ED Provider Notes (Signed)
[facility] EMERGENCY DEPARTMENT Provider Note   CSN: [id number] Arrival date & time: 07/09/21  1902     History  Chief Complaint  Patient presents with   Diarrhea    Jamie Bell is a 51 y.o. female with a history including type 2 DM, htn, cirrhosis, gerd, IVDU presenting for evaluation of ansomnia in association with diarrhea.  She reports feeling generalized malaise and achiness for the past 4-5 days, associated with non bloody diarrhea which has responded to imodium,  reporting only one loose stool early this morning.  She denies n/v/ abdominal pain, cough, sob, cp, nasal congestion, sore throat, fevers.  She presents essentially for covid testing as she noticed yesterday a loss of taste and she was told this could be covid.  She has had some mild intermittent nausea since starting a new DM medicine several weeks ago, but relates this to side effect of the medicine. Reports her cbgs have been good, checked today and 130 range.  The history is provided by the patient.       Home Medications Prior to Admission medications   Medication Sig Start Date End Date Taking? Authorizing Provider  ACCU-CHEK GUIDE test strip USE TO TEST ONCE DAILY. 11/04/19   [provider]  aspirin-acetaminophen-caffeine (EXCEDRIN MIGRAINE) [phone] MG tablet Take 2 tablets by mouth every 6 (six) hours as needed for headache.    [provider]  Blood Glucose Monitoring Suppl (ACCU-CHEK GUIDE) w/Device KIT USE AS DIRECTED.R 08/22/19   [provider]  Buprenorphine HCl-Naloxone HCl 8-2 MG FILM Place 3 Film under the tongue 3 (three) times daily. 05/18/17   [provider]  busPIRone (BUSPAR) 10 MG tablet Take 10 mg by mouth 2 (two) times daily as needed (anxiety).  07/11/19   [provider]  doxycycline (VIBRAMYCIN) 100 MG capsule Take 1 capsule (100 mg total) by mouth 2 (two) times daily. 06/26/21   Roemhildt, Lorin T, PA-C  fluticasone furoate-vilanterol (BREO  ELLIPTA) 100-25 MCG/ACT AEPB INHALE 1 PUFF INTO THE LUNGS ONCE DAILY. 03/06/21   Chesley Mires, MD  furosemide (LASIX) 20 MG tablet Take 20 mg by mouth daily. 01/09/19   [provider]  ketorolac (TORADOL) 10 MG tablet Take 1 tablet (10 mg total) by mouth every 6 (six) hours as needed. 06/26/21   Roemhildt, Lorin T, PA-C  lamoTRIgine (LAMICTAL) 25 MG tablet Take 25 mg by mouth 2 (two) times daily.  08/08/19   [provider]  LANTUS SOLOSTAR 100 UNIT/ML Solostar Pen Inject 26 Units into the skin at bedtime. 10/31/19   [provider]  linaclotide Rolan Lipa) 72 MCG capsule Take 1 capsule (72 mcg total) by mouth daily before breakfast. 04/28/21   Mahala Menghini, PA-C  liraglutide (VICTOZA) 18 MG/3ML SOPN Inject 1.2 mg into the skin every morning.    [provider]  losartan (COZAAR) 50 MG tablet Take 50 mg by mouth daily. 03/29/20   [provider]  Melatonin 10 MG TABS Take 10 mg by mouth at bedtime.    [provider]  metFORMIN (GLUCOPHAGE) 1000 MG tablet Take 1,000 mg by mouth 2 (two) times daily with a meal.    [provider]  NARCAN 4 MG/0.1ML LIQD nasal spray kit Place 0.4 mg into the nose once.  04/03/19   [provider]  pantoprazole (PROTONIX) 20 MG tablet Take 20 mg by mouth daily.    [provider]  pravastatin (PRAVACHOL) 40 MG tablet Take 40 mg by mouth daily. 12/27/18  [provider]  SPIRIVA HANDIHALER 18 MCG inhalation capsule Place 1 capsule into inhaler and inhale daily. 10/26/19   [provider]  SURE COMFORT PEN NEEDLES 31G X 5 MM MISC INJECT AS DIRECTED.O 10/31/19   [provider]  traZODone (DESYREL) 50 MG tablet Take 50-100 mg by mouth at bedtime as needed for sleep. 04/20/20   [provider]  VRAYLAR 4.5 MG CAPS Take 4.5 mg by mouth daily. 05/07/20   [provider]      Allergies    Chantix [varenicline], Ciprofloxacin, Nsaids, Shrimp [shellfish allergy],  and Tylenol [acetaminophen]    Review of Systems   Review of Systems  Constitutional:  Negative for chills and fever.  HENT:  Negative for congestion and sore throat.   Eyes: Negative.   Respiratory:  Negative for cough, chest tightness and shortness of breath.   Cardiovascular:  Negative for chest pain.  Gastrointestinal:  Positive for diarrhea and nausea. Negative for abdominal pain and vomiting.  Genitourinary: Negative.  Negative for dysuria.  Musculoskeletal:  Negative for arthralgias, joint swelling and neck pain.  Skin: Negative.  Negative for rash and wound.  Neurological:  Negative for dizziness, weakness, light-headedness, numbness and headaches.  Psychiatric/Behavioral: Negative.      Physical Exam Updated Vital Signs BP 132/78   Pulse 98   Temp 97.7 F (36.5 C) (Oral)   Resp 18   Ht _0  (1.651 m)   Wt 90.2 kg   LMP 01/04/2017 (Approximate)   SpO2 100%   BMI 33.08 kg/m  Physical Exam Vitals and nursing note reviewed.  Constitutional:      Appearance: She is well-developed.  HENT:     Head: Normocephalic and atraumatic.  Eyes:     Conjunctiva/sclera: Conjunctivae normal.  Cardiovascular:     Rate and Rhythm: Normal rate and regular rhythm.     Heart sounds: Normal heart sounds.  Pulmonary:     Effort: Pulmonary effort is normal.     Breath sounds: Normal breath sounds. No wheezing or rhonchi.  Abdominal:     General: Bowel sounds are normal.     Palpations: Abdomen is soft.     Tenderness: There is no abdominal tenderness.  Musculoskeletal:        General: Normal range of motion.     Cervical back: Normal range of motion.  Skin:    General: Skin is warm and dry.  Neurological:     Mental Status: She is alert.     ED Results / Procedures / Treatments   Labs (all labs ordered are listed, but only abnormal results are displayed) Labs Reviewed  SARS CORONAVIRUS 2 BY RT PCR    EKG None  Radiology No results found.  Procedures Procedures     Medications Ordered in ED Medications - No data to display  ED Course/ Medical Decision Making/ A&P                           Medical Decision Making Pt with generalized malaise, nausea, diarrhea which has responded to imodium.  Covid negative.  We discussed additional labs to check for electrolyte concerns, dehydration, ect.  Pt deferred, stating she really just wanted a covid test.  Advised f/u with pcp for any new or ongoing concerns.   Amount and/or Complexity of Data Reviewed Labs: ordered.    Details: covid negative           Final Clinical Impression(s) / ED  Diagnoses Final diagnoses:  Viral syndrome    Rx / DC Orders ED Discharge Orders     None         Landis Martins 07/10/21 1257    Sherwood Gambler, MD 07/10/21 2133

## 2021-07-31 ENCOUNTER — Other Ambulatory Visit (HOSPITAL_COMMUNITY): Payer: Self-pay | Admitting: Family Medicine

## 2021-07-31 ENCOUNTER — Other Ambulatory Visit: Payer: Self-pay | Admitting: Family Medicine

## 2021-07-31 DIAGNOSIS — Z720 Tobacco use: Secondary | ICD-10-CM

## 2021-09-05 ENCOUNTER — Ambulatory Visit (INDEPENDENT_AMBULATORY_CARE_PROVIDER_SITE_OTHER): Payer: Medicare Other | Admitting: Pulmonary Disease

## 2021-09-05 ENCOUNTER — Encounter: Payer: Self-pay | Admitting: Pulmonary Disease

## 2021-09-05 VITALS — BP 118/62 | HR 102 | Temp 97.7°F | Ht 66.0 in | Wt 205.6 lb

## 2021-09-05 DIAGNOSIS — J449 Chronic obstructive pulmonary disease, unspecified: Secondary | ICD-10-CM

## 2021-09-05 DIAGNOSIS — Z716 Tobacco abuse counseling: Secondary | ICD-10-CM | POA: Diagnosis not present

## 2021-09-05 MED ORDER — ALBUTEROL SULFATE HFA 108 (90 BASE) MCG/ACT IN AERS: 2.0000 | INHALATION_SPRAY | Freq: Four times a day (QID) | RESPIRATORY_TRACT | 5 refills | Status: AC | PRN

## 2021-09-05 MED ORDER — TRELEGY ELLIPTA 100-62.5-25 MCG/ACT IN AEPB: 1.0000 | INHALATION_SPRAY | Freq: Every day | RESPIRATORY_TRACT | 5 refills | Status: DC

## 2021-09-05 NOTE — Progress Notes (Signed)
[facility]  Chief Complaint  Patient presents with   Follow-up    Breathing doing well since last ov. Wants to discuss trelegy inhaler.     Constitutional:  BP 118/62 (BP Location: Left Arm, Patient Position: Sitting)   Pulse (!) 102   Temp 97.7 F (36.5 C) (Temporal)   Ht 5' 6"  (1.676 m)   Wt 205 lb 9.6 oz (93.3 kg)   LMP 01/04/2017 (Approximate)   SpO2 97% Comment: ra  BMI 33.18 kg/m   Past Medical History:  NASH with cirrhosis, GERD, Chronic pain, Depression, Diabetes, HTN, MRSA bacteremia 2014, Polycystic ovarian syndrome, Pneumonia 2016, Sciatica  Past Surgical History:  She  has a past surgical history that includes Back surgery; Cholecystectomy; Neck surgery; Carpal tunnel release; TEE without cardioversion (N/A, 08/09/2012); Peripherally inserted central catheter insertion (Right, 2007); Breast surgery (Left); Posterior cervical laminectomy (N/A, 11/22/2013); Cataract extraction w/PHACO (Left, 02/04/2015); Esophagogastroduodenoscopy (2007); Colonoscopy with propofol (N/A, 11/12/2015); Esophagogastroduodenoscopy (egd) with propofol (N/A, 11/12/2015); esophageal banding (N/A, 11/12/2015); polypectomy (11/12/2015); Incision and drainage wound with tendon repair (Right, 07/03/2016); Anterior cervical decomp/discectomy fusion (N/A, 05/28/2017); Breast excisional biopsy (Left); Esophagogastroduodenoscopy (egd) with propofol (N/A, 10/17/2019); Cataract extraction w/PHACO (Right, 05/24/2020); and IR Radiologist Eval & Mgmt (09/24/2020).  Brief Summary:  Jamie Bell is a 51 y.o. female smoker with COPD and chronic bronchitis.      Subjective:   She still smokes in the morning and vaps later in the day.  She used her father's trelegy recently and this worked much better.  Not having cough, wheeze, or sputum. Not using albuterol much.  Physical Exam:   Appearance - well kempt   ENMT - no sinus tenderness, no oral exudate, no LAN,  Mallampati 2 airway, no stridor  Respiratory - equal breath sounds bilaterally, no wheezing or rales  CV - s1s2 regular rate and rhythm, no murmurs  Ext - no clubbing, no edema  Skin - no rashes  Psych - normal mood and affect    Pulmonary testing:  PFT 11/15/12 >> FEV1 3.28 (109%), FEV1% 80, TLC 5.93 (117%), DLCO 73%  Chest Imaging:    Cardiac Tests:  Echo 06/03/14 >> EF 55%  Social History:  She  reports that she has been smoking e-cigarettes and cigarettes. She has been smoking an average of .5 packs per day. She has never used smokeless tobacco. She reports that she does not currently use alcohol. She reports current drug use. Drugs: Marijuana and IV.  Family History:  Her family history includes Cirrhosis in her father, paternal aunt, and paternal aunt; Diabetes in her brother and another family member; Hypertension in her father and mother.     Assessment/Plan:   COPD with chronic bronchitis. - will switch to trelegy 100 one puff daily - prn albuterol  Tobacco abuse. - discussed strategies to help with smoking cessation - she has low dose CT chest scheduled for later this month through her PCP  Left perinephric abscess. - she has follow up with urology  NASH with cirrhosis. - followed by Dr. Hurshel Keys with [facility]  Time Spent Involved in Patient Care on Day of Examination:  27 minutes  Follow up:   Patient Instructions  Trelegy one puff daily, and rinse your mouth after each use  Follow up in 6 months  Medication List:   Allergies as of 09/05/2021       Reactions   Chantix [varenicline] Other (See Comments)   Vivid nightmares  Ciprofloxacin Swelling   Nsaids Hives   Pt takes ibuprofen with no reaction   Shrimp [shellfish Allergy] Itching   Makes hands itch    Tylenol [acetaminophen] Other (See Comments)   Patient has cirrhosis of the liver and this causes elevated levels        Medication List        Accurate  as of September 05, 2021 12:20 PM. If you have any questions, ask your nurse or doctor.          STOP taking these medications    Breo Ellipta 100-25 MCG/ACT Aepb Generic drug: fluticasone furoate-vilanterol Stopped by: Chesley Mires, MD   busPIRone 10 MG tablet Commonly known as: BUSPAR Stopped by: Chesley Mires, MD   doxycycline 100 MG capsule Commonly known as: VIBRAMYCIN Stopped by: Chesley Mires, MD   ketorolac 10 MG tablet Commonly known as: TORADOL Stopped by: Chesley Mires, MD   liraglutide 18 MG/3ML Sopn Commonly known as: VICTOZA Stopped by: Chesley Mires, MD   Narcan 4 MG/0.1ML Liqd nasal spray kit Generic drug: naloxone Stopped by: Chesley Mires, MD   Spiriva HandiHaler 18 MCG inhalation capsule Generic drug: tiotropium Stopped by: Chesley Mires, MD       TAKE these medications    Accu-Chek Guide test strip Generic drug: glucose blood USE TO TEST ONCE DAILY.   Accu-Chek Guide w/Device Kit USE AS DIRECTED.R   albuterol 108 (90 Base) MCG/ACT inhaler Commonly known as: Ventolin HFA Inhale 2 puffs into the lungs every 6 (six) hours as needed for wheezing or shortness of breath. Started by: Chesley Mires, MD   aspirin-acetaminophen-caffeine [phone] MG tablet Commonly known as: EXCEDRIN MIGRAINE Take 2 tablets by mouth every 6 (six) hours as needed for headache.   Buprenorphine HCl-Naloxone HCl 8-2 MG Film Place 3 Film under the tongue 3 (three) times daily.   furosemide 20 MG tablet Commonly known as: LASIX Take 20 mg by mouth as needed.   lamoTRIgine 25 MG tablet Commonly known as: LAMICTAL Take 25 mg by mouth 2 (two) times daily.   Lantus SoloStar 100 UNIT/ML Solostar Pen Generic drug: insulin glargine Inject 26 Units into the skin at bedtime.   linaclotide 72 MCG capsule Commonly known as: Linzess Take 1 capsule (72 mcg total) by mouth daily before breakfast.   losartan 50 MG tablet Commonly known as: COZAAR Take 50 mg by mouth daily.    Melatonin 10 MG Tabs Take 10 mg by mouth at bedtime.   metFORMIN 1000 MG tablet Commonly known as: GLUCOPHAGE Take 1,000 mg by mouth 2 (two) times daily with a meal.   pantoprazole 20 MG tablet Commonly known as: PROTONIX Take 20 mg by mouth daily.   pravastatin 40 MG tablet Commonly known as: PRAVACHOL Take 40 mg by mouth daily.   Sure Comfort Pen Needles 31G X 5 MM Misc Generic drug: Insulin Pen Needle INJECT AS DIRECTED.O   traZODone 50 MG tablet Commonly known as: DESYREL Take 50-100 mg by mouth at bedtime as needed for sleep.   Trelegy Ellipta 100-62.5-25 MCG/ACT Aepb Generic drug: Fluticasone-Umeclidin-Vilant Inhale 1 puff into the lungs daily in the afternoon. Started by: Chesley Mires, MD   Vraylar 4.5 MG Caps Generic drug: Cariprazine HCl Take 4.5 mg by mouth daily.        Signature:  Chesley Mires, MD [place] Pager - [phone] 09/05/2021, 12:20 PM

## 2021-09-05 NOTE — Patient Instructions (Signed)
Trelegy one puff daily, and rinse your mouth after each use  Follow up in 6 months

## 2021-09-08 ENCOUNTER — Ambulatory Visit (HOSPITAL_COMMUNITY)
Admission: RE | Admit: 2021-09-08 | Discharge: 2021-09-08 | Disposition: A | Discharge status: Home / Self Care | Payer: Medicare Other | Source: Ambulatory Visit | Attending: Family Medicine | Admitting: Family Medicine

## 2021-09-08 DIAGNOSIS — I2510 Atherosclerotic heart disease of native coronary artery without angina pectoris: Secondary | ICD-10-CM | POA: Diagnosis not present

## 2021-09-08 DIAGNOSIS — Z122 Encounter for screening for malignant neoplasm of respiratory organs: Secondary | ICD-10-CM | POA: Diagnosis present

## 2021-09-08 DIAGNOSIS — F17210 Nicotine dependence, cigarettes, uncomplicated: Secondary | ICD-10-CM | POA: Diagnosis not present

## 2021-09-08 DIAGNOSIS — J439 Emphysema, unspecified: Secondary | ICD-10-CM | POA: Insufficient documentation

## 2021-09-08 DIAGNOSIS — Z720 Tobacco use: Secondary | ICD-10-CM | POA: Insufficient documentation

## 2021-09-08 LAB — HEPATIC FUNCTION PANEL
AG Ratio: 1.2 (calc) (ref 1.0–2.5)
ALT: 41 U/L — ABNORMAL HIGH (ref 6–29)
AST: 54 U/L — ABNORMAL HIGH (ref 10–35)
Albumin: 4.3 g/dL (ref 3.6–5.1)
Alkaline phosphatase (APISO): 110 U/L (ref 37–153)
Bilirubin, Direct: 0.1 mg/dL (ref 0.0–0.2)
Globulin: 3.6 g/dL (calc) (ref 1.9–3.7)
Indirect Bilirubin: 0.3 mg/dL (calc) (ref 0.2–1.2)
Total Bilirubin: 0.4 mg/dL (ref 0.2–1.2)
Total Protein: 7.9 g/dL (ref 6.1–8.1)

## 2021-09-08 LAB — HCV RNA, QUANT REAL-TIME PCR W/REFLEX
HCV RNA, PCR, QN (Log): <1.18 LogIU/mL
HCV RNA, PCR, QN: <15 IU/mL

## 2021-09-08 LAB — IRON,TIBC AND FERRITIN PANEL
%SAT: 16 % (calc) (ref 16–45)
Ferritin: 42 ng/mL (ref 16–232)
Iron: 61 ug/dL (ref 45–160)
TIBC: 391 mcg/dL (calc) (ref 250–450)

## 2021-09-08 LAB — IGG, IGA, IGM
IgG (Immunoglobin G), Serum: 1625 mg/dL (ref 600–1640)
IgM, Serum: 214 mg/dL (ref 50–300)
Immunoglobulin A: 367 mg/dL — ABNORMAL HIGH (ref 47–310)

## 2021-09-08 LAB — ANA: Anti Nuclear Antibody (ANA): NEGATIVE

## 2021-09-10 ENCOUNTER — Other Ambulatory Visit: Payer: Self-pay

## 2021-09-10 DIAGNOSIS — R7989 Other specified abnormal findings of blood chemistry: Secondary | ICD-10-CM

## 2021-09-10 DIAGNOSIS — K7460 Unspecified cirrhosis of liver: Secondary | ICD-10-CM
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
## 2021-10-27 ENCOUNTER — Telehealth: Payer: Self-pay | Admitting: *Deleted

## 2021-10-27 ENCOUNTER — Ambulatory Visit (INDEPENDENT_AMBULATORY_CARE_PROVIDER_SITE_OTHER): Payer: Medicare Other | Admitting: Gastroenterology

## 2021-10-27 ENCOUNTER — Encounter: Payer: Self-pay | Admitting: Gastroenterology

## 2021-10-27 VITALS — BP 105/70 | HR 98 | Temp 97.8°F | Ht 65.0 in | Wt 203.6 lb

## 2021-10-27 DIAGNOSIS — K5900 Constipation, unspecified: Secondary | ICD-10-CM | POA: Diagnosis not present

## 2021-10-27 DIAGNOSIS — K219 Gastro-esophageal reflux disease without esophagitis: Secondary | ICD-10-CM | POA: Diagnosis not present

## 2021-10-27 DIAGNOSIS — K7460 Unspecified cirrhosis of liver: Secondary | ICD-10-CM | POA: Diagnosis not present

## 2021-10-27 MED ORDER — LINACLOTIDE 145 MCG PO CAPS
145.0000 ug | ORAL_CAPSULE | Freq: Every day | ORAL | 5 refills | Status: DC
Start: 2021-10-27 — End: 2022-11-30

## 2021-10-27 NOTE — Addendum Note (Signed)
Addended by: Mahala Menghini on: 10/27/2021 02:55 PM   Modules accepted: Orders

## 2021-10-27 NOTE — Patient Instructions (Signed)
Please update your labs and ultrasound as scheduled. We will be in touch with results as available.  RX for Linzess 122mg daily as needed sent to your pharmacy.  Return to the office in six months. We will plan to update your colonoscopy and upper endoscopy at the same time.

## 2021-10-27 NOTE — Telephone Encounter (Signed)
Pt informed us scheduled for 10/29/21 at 9:30 am, arrive at 9:15 am, nothing to eat or drink after midnight.

## 2021-10-29 ENCOUNTER — Ambulatory Visit (HOSPITAL_COMMUNITY)
Admission: RE | Admit: 2021-10-29 | Discharge: 2021-10-29 | Disposition: A | Discharge status: Home / Self Care | Payer: Medicare Other | Source: Ambulatory Visit | Attending: Gastroenterology | Admitting: Gastroenterology

## 2021-10-29 DIAGNOSIS — K219 Gastro-esophageal reflux disease without esophagitis: Secondary | ICD-10-CM | POA: Insufficient documentation

## 2021-10-29 DIAGNOSIS — K5900 Constipation, unspecified: Secondary | ICD-10-CM | POA: Insufficient documentation

## 2021-10-29 DIAGNOSIS — K7460 Unspecified cirrhosis of liver: Secondary | ICD-10-CM | POA: Diagnosis not present

## 2021-11-10 ENCOUNTER — Other Ambulatory Visit (HOSPITAL_COMMUNITY): Payer: Self-pay | Admitting: Family Medicine

## 2021-11-10 DIAGNOSIS — M79606 Pain in leg, unspecified: Secondary | ICD-10-CM

## 2021-11-10 LAB — CBC WITH DIFFERENTIAL/PLATELET
Absolute Monocytes: 479 cells/uL (ref 200–950)
Basophils Absolute: 61 cells/uL (ref 0–200)
Basophils Relative: 0.8 %
Eosinophils Absolute: 160 cells/uL (ref 15–500)
Eosinophils Relative: 2.1 %
HCT: 45.3 % — ABNORMAL HIGH (ref 35.0–45.0)
Hemoglobin: 15.7 g/dL — ABNORMAL HIGH (ref 11.7–15.5)
Lymphs Abs: 1968 cells/uL (ref 850–3900)
MCH: 31.5 pg (ref 27.0–33.0)
MCHC: 34.7 g/dL (ref 32.0–36.0)
MCV: 91 fL (ref 80.0–100.0)
MPV: 11.3 fL (ref 7.5–12.5)
Monocytes Relative: 6.3 %
Neutro Abs: 4932 cells/uL (ref 1500–7800)
Neutrophils Relative %: 64.9 %
Platelets: 168 10*3/uL (ref 140–400)
RBC: 4.98 10*6/uL (ref 3.80–5.10)
RDW: 13.2 % (ref 11.0–15.0)
Total Lymphocyte: 25.9 %
WBC: 7.6 10*3/uL (ref 3.8–10.8)

## 2021-11-10 LAB — COMPREHENSIVE METABOLIC PANEL
AG Ratio: 1.2 (calc) (ref 1.0–2.5)
ALT: 41 U/L — ABNORMAL HIGH (ref 6–29)
AST: 43 U/L — ABNORMAL HIGH (ref 10–35)
Albumin: 4.3 g/dL (ref 3.6–5.1)
Alkaline phosphatase (APISO): 97 U/L (ref 37–153)
BUN/Creatinine Ratio: 18 (calc) (ref 6–22)
BUN: 20 mg/dL (ref 7–25)
CO2: 25 mmol/L (ref 20–32)
Calcium: 9.7 mg/dL (ref 8.6–10.4)
Chloride: 106 mmol/L (ref 98–110)
Creat: 1.1 mg/dL — ABNORMAL HIGH (ref 0.50–1.03)
Globulin: 3.6 g/dL (calc) (ref 1.9–3.7)
Glucose, Bld: 113 mg/dL — ABNORMAL HIGH (ref 65–99)
Potassium: 4.2 mmol/L (ref 3.5–5.3)
Sodium: 140 mmol/L (ref 135–146)
Total Bilirubin: 0.4 mg/dL (ref 0.2–1.2)
Total Protein: 7.9 g/dL (ref 6.1–8.1)

## 2021-11-10 LAB — PROTIME-INR
INR: 1.1
Prothrombin Time: 11.4 s (ref 9.0–11.5)

## 2021-11-10 LAB — TISSUE TRANSGLUTAMINASE, IGA: (tTG) Ab, IgA: <1 U/mL

## 2021-11-10 LAB — AFP TUMOR MARKER: AFP-Tumor Marker: 4.2 ng/mL

## 2021-11-10 LAB — ANTI-MICROSOMAL ANTIBODY LIVER / KIDNEY: LKM1 Ab: <=20 U (ref <=20.0)

## 2021-11-13 ENCOUNTER — Ambulatory Visit (HOSPITAL_COMMUNITY)
Admission: RE | Admit: 2021-11-13 | Discharge: 2021-11-13 | Disposition: A | Discharge status: Home / Self Care | Payer: Medicare Other | Source: Ambulatory Visit | Attending: Family Medicine | Admitting: Family Medicine

## 2021-11-13 DIAGNOSIS — M79606 Pain in leg, unspecified: Secondary | ICD-10-CM | POA: Diagnosis present

## 2022-03-12 ENCOUNTER — Other Ambulatory Visit: Payer: Self-pay

## 2022-03-12 ENCOUNTER — Emergency Department (HOSPITAL_COMMUNITY): Payer: 59

## 2022-03-12 ENCOUNTER — Emergency Department (HOSPITAL_COMMUNITY)
Admission: EM | Admit: 2022-03-12 | Discharge: 2022-03-12 | Disposition: A | Discharge status: Home / Self Care | Payer: 59 | Attending: Emergency Medicine | Admitting: Emergency Medicine

## 2022-03-12 ENCOUNTER — Encounter (HOSPITAL_COMMUNITY): Payer: Self-pay

## 2022-03-12 DIAGNOSIS — S80211A Abrasion, right knee, initial encounter: Secondary | ICD-10-CM | POA: Diagnosis not present

## 2022-03-12 DIAGNOSIS — S8991XA Unspecified injury of right lower leg, initial encounter: Secondary | ICD-10-CM

## 2022-03-12 DIAGNOSIS — Z794 Long term (current) use of insulin: Secondary | ICD-10-CM | POA: Insufficient documentation

## 2022-03-12 DIAGNOSIS — W1830XA Fall on same level, unspecified, initial encounter: Secondary | ICD-10-CM | POA: Diagnosis not present

## 2022-03-12 DIAGNOSIS — M25561 Pain in right knee: Secondary | ICD-10-CM | POA: Diagnosis present

## 2022-03-12 LAB — CBG MONITORING, ED: Glucose-Capillary: 119 mg/dL — ABNORMAL HIGH (ref 70–99)

## 2022-03-12 NOTE — ED Triage Notes (Signed)
Pt comes in from falling on Saturday 03/07/2022 and complaints of swelling and bruising on right leg, bruise on left toe. Right medial knee mild swelling noted.

## 2022-03-12 NOTE — Discharge Instructions (Addendum)
Since you are unable to use Tylenol or Motrin I would suggest icing your knee because you only have a sprain and narcotic pain medications are definitely not indicated for that.  Crutches and your knee support.  If you are not getting any better follow-up your primary care doctor.

## 2022-03-12 NOTE — ED Provider Notes (Signed)
[facility] Provider Note   CSN: [id number] Arrival date & time: 03/12/22  1352     History  Chief Complaint  Patient presents with   Lytle Michaels    Jamie Bell is a 52 y.o. female sho co ofR knee pain. She fell on her knee 5 days ago. She had an abrasion that is well healing. She is ambulating with a limp. She denies neuro complaints.    Fall       Home Medications Prior to Admission medications   Medication Sig Start Date End Date Taking? Authorizing Provider  ACCU-CHEK GUIDE test strip USE TO TEST ONCE DAILY. 11/04/19   [provider]  albuterol (VENTOLIN HFA) 108 (90 Base) MCG/ACT inhaler Inhale 2 puffs into the lungs every 6 (six) hours as needed for wheezing or shortness of breath. 09/05/21   Chesley Mires, MD  aspirin-acetaminophen-caffeine (EXCEDRIN MIGRAINE) [phone] MG tablet Take 2 tablets by mouth every 6 (six) hours as needed for headache.    [provider]  Blood Glucose Monitoring Suppl (ACCU-CHEK GUIDE) w/Device KIT USE AS DIRECTED.R 08/22/19   [provider]  Buprenorphine HCl-Naloxone HCl 8-2 MG FILM Place 3 Film under the tongue 3 (three) times daily. 05/18/17   [provider]  Fluticasone-Umeclidin-Vilant (TRELEGY ELLIPTA) 100-62.5-25 MCG/ACT AEPB Inhale 1 puff into the lungs daily in the afternoon. 09/05/21   Chesley Mires, MD  furosemide (LASIX) 20 MG tablet Take 20 mg by mouth as needed. 01/09/19   [provider]  hydrOXYzine (ATARAX) 50 MG tablet Take 50 mg by mouth at bedtime. 10/22/21   [provider]  lamoTRIgine (LAMICTAL) 25 MG tablet Take 25 mg by mouth 2 (two) times daily.  08/08/19   [provider]  LANTUS SOLOSTAR 100 UNIT/ML Solostar Pen Inject 26 Units into the skin at bedtime. 10/31/19   [provider]  linaclotide Rolan Lipa) 145 MCG CAPS capsule Take 1 capsule (145 mcg total) by mouth daily before breakfast. 10/27/21   Mahala Menghini, PA-C  losartan (COZAAR) 50 MG tablet Take 50 mg by mouth daily. 03/29/20   [provider]  Melatonin 10 MG TABS Take 10 mg by mouth at bedtime.    [provider]  metFORMIN (GLUCOPHAGE) 1000 MG tablet Take 1,000 mg by mouth 2 (two) times daily with a meal.    [provider]  pantoprazole (PROTONIX) 40 MG tablet Take 40 mg by mouth daily. 08/01/21   [provider]  pravastatin (PRAVACHOL) 40 MG tablet Take 40 mg by mouth daily. 12/27/18   [provider]  SURE COMFORT PEN NEEDLES 31G X 5 MM MISC INJECT AS DIRECTED.O 10/31/19   [provider]  tirzepatide Darcel Bayley) 7.5 MG/0.5ML Pen Inject 7.40m Subcutaneous once weekly for 30 days 10/23/21   [provider]  traZODone (DESYREL) 50 MG tablet Take 50-100 mg by mouth at bedtime as needed for sleep. 04/20/20   [provider]  VRAYLAR 4.5 MG CAPS Take 4.5 mg by mouth daily. 05/07/20   [provider]      Allergies    Chantix [varenicline], Ciprofloxacin, Nsaids, Shrimp [shellfish allergy], and Tylenol [acetaminophen]    Review of Systems   Review of Systems  Physical Exam Updated Vital Signs BP (!) 134/97   Pulse 97   Temp 97.9 F (36.6 C) (Oral)   Resp 19   Ht 5' 5"$  (1.651 m)   Wt 88.5 kg   LMP 01/04/2017 (Approximate)   SpO2  100%   BMI 32.45 kg/m  Physical Exam Vitals and nursing note reviewed.  Constitutional:      General: She is not in acute distress.    Appearance: She is well-developed. She is not diaphoretic.  HENT:     Head: Normocephalic and atraumatic.     Right Ear: External ear normal.     Left Ear: External ear normal.     Nose: Nose normal.     Mouth/Throat:     Mouth: Mucous membranes are moist.  Eyes:     General: No scleral icterus.    Conjunctiva/sclera: Conjunctivae normal.  Cardiovascular:     Rate and Rhythm: Normal rate and regular rhythm.     Heart sounds: Normal heart sounds. No murmur heard.    No friction rub. No  gallop.  Pulmonary:     Effort: Pulmonary effort is normal. No respiratory distress.     Breath sounds: Normal breath sounds.  Abdominal:     General: Bowel sounds are normal. There is no distension.     Palpations: Abdomen is soft. There is no mass.     Tenderness: There is no abdominal tenderness. There is no guarding.  Musculoskeletal:     Cervical back: Normal range of motion.     Comments: Old abrasion to R knee. ROM limited with mild pain. Stable ligaments. No effusion. Normal ipsilateral ankle and hip  Skin:    General: Skin is warm and dry.  Neurological:     Mental Status: She is alert and oriented to person, place, and time.  Psychiatric:        Behavior: Behavior normal.     ED Results / Procedures / Treatments   Labs (all labs ordered are listed, but only abnormal results are displayed) Labs Reviewed  CBG MONITORING, ED - Abnormal; Notable for the following components:      Result Value   Glucose-Capillary 119 (*)    All other components within normal limits    EKG None  Radiology DG Knee 2 Views Right  Result Date: 03/12/2022 CLINICAL DATA:  Right knee pain and swelling after fall 5 days ago. EXAM: RIGHT KNEE - 1-2 VIEW COMPARISON:  None Available. FINDINGS: No evidence of fracture, dislocation, or joint effusion. Minimal narrowing of medial joint space is noted. Soft tissues are unremarkable. IMPRESSION: Minimal degenerative joint disease is noted medially. No acute abnormality seen. Electronically Signed   By: Marijo Conception M.D.   On: 03/12/2022 15:20    Procedures Procedures    Medications Ordered in ED Medications - No data to display  ED Course/ Medical Decision Making/ A&P                             Medical Decision Making Patient X-Ray negative for obvious fracture or dislocation. . Pt advised to follow up with orthopedics or pcp if symptoms persist .Patient given brace while in ED, conservative therapy recommended and discussed. Patient will  be dc home & is agreeable with above plan.   Amount and/or Complexity of Data Reviewed Labs:     Details: Cbg unremarkable Radiology: ordered and independent interpretation performed.    Details: I personally visualized and interpreted the images using our PACS system. Acute findings include:  No acute findings.           Final Clinical Impression(s) / ED Diagnoses Final diagnoses:  None    Rx / DC Orders ED Discharge Orders  None         Margarita Mail, PA-C 03/15/22 1312    Isla Pence, MD 03/17/22 [phone]

## 2022-04-17 ENCOUNTER — Ambulatory Visit: Payer: Medicare Other | Admitting: Pulmonary Disease
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
## 2022-05-04 ENCOUNTER — Encounter: Payer: Self-pay | Admitting: Gastroenterology

## 2022-05-04 ENCOUNTER — Ambulatory Visit (INDEPENDENT_AMBULATORY_CARE_PROVIDER_SITE_OTHER): Payer: 59 | Admitting: Gastroenterology

## 2022-05-04 VITALS — BP 125/87 | HR 94 | Temp 97.5°F | Ht 64.0 in | Wt 195.4 lb

## 2022-05-04 DIAGNOSIS — K219 Gastro-esophageal reflux disease without esophagitis: Secondary | ICD-10-CM | POA: Diagnosis not present

## 2022-05-04 DIAGNOSIS — K5900 Constipation, unspecified: Secondary | ICD-10-CM

## 2022-05-04 DIAGNOSIS — D369 Benign neoplasm, unspecified site: Secondary | ICD-10-CM

## 2022-05-04 DIAGNOSIS — R6881 Early satiety: Secondary | ICD-10-CM

## 2022-05-04 DIAGNOSIS — K7460 Unspecified cirrhosis of liver: Secondary | ICD-10-CM | POA: Diagnosis not present

## 2022-05-04 NOTE — Patient Instructions (Signed)
Please complete labs and ultrasound as scheduled. Colonoscopy and upper endoscopy to be scheduled.  Try to eat smaller portions and eat throughout the day, giving at least 2-3 hours in between snacks or meals.  Fatty or fried foods and high fiber foods are the hardest to digest and can make stomach emptying delayed. Try reducing these foods as well.  Call if you have abdominal pain or vomiting.

## 2022-05-05 ENCOUNTER — Telehealth: Payer: Self-pay | Admitting: *Deleted

## 2022-05-05 ENCOUNTER — Ambulatory Visit (HOSPITAL_COMMUNITY)
Admission: RE | Admit: 2022-05-05 | Discharge: 2022-05-05 | Disposition: A | Discharge status: Home / Self Care | Payer: 59 | Source: Ambulatory Visit | Attending: Urology | Admitting: Urology

## 2022-05-05 DIAGNOSIS — N281 Cyst of kidney, acquired: Secondary | ICD-10-CM | POA: Insufficient documentation

## 2022-05-05 NOTE — Telephone Encounter (Signed)
[place] scheduled for 4/18, arrival 10:15am, npo midnight. Also needs to be scheduled for TCS/EGD with Dr. Marletta Lor, ASA 3, see encounter form for full details   Called pt mobile, not working, called home #, no answer and no VM.

## 2022-05-05 NOTE — Telephone Encounter (Signed)
Patient left a message returning the call.

## 2022-05-06 MED ORDER — CLENPIQ 10-3.5-12 MG-GM -GM/175ML PO SOLN: 1.0000 | ORAL | 0 refills | Status: DC

## 2022-05-06 NOTE — Telephone Encounter (Signed)
SPOKE WITH PT. She is aware of her [place] appt details. Also scheduled for procedure 5/6 at 2:15pm. Aware will call back with pre-op appt. Instructions will be mailed. Aware to stop phentermine and mounjaro 7 days prior. She voiced understanding. Aware will also send clenpiq per her request.  Per [facility] "Notification or Prior Authorization is not required for the requested services You are not required to submit a notification/prior authorization based on the information provided. The number above acknowledges your inquiry and our response. Please write this number down and refer to it for future inquiries. If you still wish to submit your request for review, please select the Continue with Submission button below. Decision ID #: [id number]"

## 2022-05-07 ENCOUNTER — Telehealth: Payer: Self-pay | Admitting: *Deleted

## 2022-05-07 ENCOUNTER — Encounter: Payer: Self-pay | Admitting: *Deleted

## 2022-05-07 ENCOUNTER — Other Ambulatory Visit: Payer: Self-pay

## 2022-05-07 ENCOUNTER — Telehealth: Payer: Self-pay | Admitting: Gastroenterology

## 2022-05-07 ENCOUNTER — Encounter: Payer: Self-pay | Admitting: Gastroenterology

## 2022-05-07 DIAGNOSIS — K7460 Unspecified cirrhosis of liver: Secondary | ICD-10-CM

## 2022-05-07 NOTE — Telephone Encounter (Signed)
Patent with upcoming labs. Can we also get Hep B surface Ab and HCV RNA? Dx: elevated LFTs, cirrhosis

## 2022-05-07 NOTE — Telephone Encounter (Signed)
Labs were ordered and pt was instructed to pick the requisitions up on the way to the lab.

## 2022-05-07 NOTE — Telephone Encounter (Signed)
Pt called to reschedule her procedure from 06/01/22 until 06/05/22 at 2:15 pm. She says when she made the appointment she didn't realize she already had one that day. New instructions sent to pt.

## 2022-05-08 ENCOUNTER — Encounter: Payer: Self-pay | Admitting: *Deleted

## 2022-05-14 ENCOUNTER — Ambulatory Visit (HOSPITAL_COMMUNITY)
Admission: RE | Admit: 2022-05-14 | Discharge: 2022-05-14 | Disposition: A | Discharge status: Home / Self Care | Payer: 59 | Source: Ambulatory Visit | Attending: Gastroenterology | Admitting: Gastroenterology

## 2022-05-14 DIAGNOSIS — D369 Benign neoplasm, unspecified site: Secondary | ICD-10-CM | POA: Diagnosis present

## 2022-05-14 DIAGNOSIS — K7460 Unspecified cirrhosis of liver: Secondary | ICD-10-CM | POA: Insufficient documentation

## 2022-05-14 DIAGNOSIS — K219 Gastro-esophageal reflux disease without esophagitis: Secondary | ICD-10-CM | POA: Insufficient documentation

## 2022-05-14 DIAGNOSIS — R6881 Early satiety: Secondary | ICD-10-CM | POA: Diagnosis present

## 2022-05-14 DIAGNOSIS — K5900 Constipation, unspecified: Secondary | ICD-10-CM | POA: Diagnosis present

## 2022-05-20 ENCOUNTER — Ambulatory Visit (INDEPENDENT_AMBULATORY_CARE_PROVIDER_SITE_OTHER): Payer: 59 | Admitting: Urology

## 2022-05-20 VITALS — BP 99/65 | HR 108

## 2022-05-20 DIAGNOSIS — N281 Cyst of kidney, acquired: Secondary | ICD-10-CM

## 2022-05-20 LAB — URINALYSIS, ROUTINE W REFLEX MICROSCOPIC
Bilirubin, UA: NEGATIVE
Glucose, UA: NEGATIVE
Ketones, UA: NEGATIVE
Leukocytes,UA: NEGATIVE
Nitrite, UA: NEGATIVE
Protein,UA: NEGATIVE
RBC, UA: NEGATIVE
Specific Gravity, UA: 1.01 (ref 1.005–1.030)
Urobilinogen, Ur: 0.2 mg/dL (ref 0.2–1.0)
pH, UA: 5.5 (ref 5.0–7.5)

## 2022-05-20 NOTE — Progress Notes (Signed)
05/20/2022 2:46 PM   Jamie Bell 1970/10/10 [id number]  Referring provider: Joana Reamer, DO [address] [place],  [place] [address]  Follow renal abscess   HPI: Jamie Bell is a 51yo here for followup for renal abscess. She denies any flank pain. No UTI since last visit. Renal US 05/05/2022 shows no evidence of renal abscess. No other complaints today   PMH: Past Medical History:  Diagnosis Date   Abscess    NECK ANTERIOR   Arthritis    Chronic back pain    Cirrhosis    Constipation    DDD (degenerative disc disease)    Decreased hearing on the right.   F/u by ENT in the past and no further management.   Depression    Diabetes mellitus    Dyspnea    with exertion   GERD (gastroesophageal reflux disease)    High cholesterol    Hypertension    took med. for HTN, several yrs. ago, no longer needs , seen by Dr. Dietrich Pates last yr. during a hosp. at [facility], for MRSA bacteremia    MRSA bacteremia 2014   Tx with Vancomycin   NASH (nonalcoholic steatohepatitis)    PCOS (polycystic ovarian syndrome)    Pneumonia 2016   Polysubstance abuse    Sciatica    HNP- Cerv. 7- T2    Surgical History: Past Surgical History:  Procedure Laterality Date   ANTERIOR CERVICAL DECOMP/DISCECTOMY FUSION N/A 05/28/2017   Procedure: ANTERIOR CERVICAL DECOMPRESSION CERVICAL THREE-FOUR;  Surgeon: Coletta Memos, MD;  Location: [facility];  Service: Neurosurgery;  Laterality: N/A;  anterior   BACK SURGERY     BREAST EXCISIONAL BIOPSY Left    negative   BREAST SURGERY Left    blocked milk ducts    CARPAL TUNNEL RELEASE     CATARACT EXTRACTION W/PHACO Left 02/04/2015   Procedure: CATARACT EXTRACTION PHACO AND INTRAOCULAR LENS PLACEMENT (IOC);  Surgeon: Gemma Payor, MD;  Location: [facility] OR[facility];  Service: Ophthalmology;  Laterality: Left;  CDE: 4.11   CATARACT EXTRACTION W/PHACO Right 05/24/2020   Procedure: CATARACT EXTRACTION PHACO AND INTRAOCULAR LENS PLACEMENT RIGHT EYE;  Surgeon: Fabio Pierce,  MD;  Location: [facility];  Service: Ophthalmology;  Laterality: Right;  right CDE=28.55   CHOLECYSTECTOMY     COLONOSCOPY WITH PROPOFOL N/A 11/12/2015   Dr. fields:Small internal hemorrhoids, 2 polyps removed (tubular adenomas) with noted congestion and edema in the left colon most consistent with NSAID use.  Next colonoscopy in 5 to 10 years.   ESOPHAGEAL BANDING N/A 11/12/2015   Procedure: ESOPHAGEAL BANDING;  Surgeon: West Bali, MD;  Location: [facility];  Service: Endoscopy;  Laterality: N/A;   ESOPHAGOGASTRODUODENOSCOPY  2007   Dr. Darrick Penna: mild antral erythema. Future procedures need to be done with Propofol per notes.    ESOPHAGOGASTRODUODENOSCOPY (EGD) WITH PROPOFOL N/A 11/12/2015   Dr. Darrick Penna: Normal esophagus with no varices.  Gastritis and duodenitis, biopsy without H. pylori, most consistent with NSAID/aspirin use.   ESOPHAGOGASTRODUODENOSCOPY (EGD) WITH PROPOFOL N/A 10/17/2019   Carver: no esophageal varices. biopsies from esophagus c/w reflux esophagitis. she had portal hypertensive gastropathy. gastric bx negative for H.pylori and showed nonspecific reactive gastropathy. next EGD in 3 years.    INCISION AND DRAINAGE WOUND WITH TENDON REPAIR Right 07/03/2016   Procedure: INCISION AND DRAINAGE RIGHT LONG PROXIMAL INTERPHALANGEAL JOINT WOUND WITH  TENDON REPAIR AND CULTURES;  Surgeon: Dairl Ponder, MD;  Location: [facility] OR;  Service: Orthopedics;  Laterality: Right;   IR RADIOLOGIST  EVAL & MGMT  09/24/2020   NECK SURGERY     PERIPHERALLY INSERTED CENTRAL CATHETER INSERTION Right 2007   for treatment with Vancomycin    POLYPECTOMY  11/12/2015   Procedure: POLYPECTOMY;  Surgeon: West Bali, MD;  [facility];  Service: Endoscopy;;  transverse colon polyp   POSTERIOR CERVICAL LAMINECTOMY N/A 11/22/2013   Procedure: POSTERIOR CERVICAL LAMINECTOMY C7/T1, T/1-2;  Surgeon: Mariam Dollar, MD;  [facility]: [facility];  Service: Neurosurgery;  Laterality: N/A;  POSTERIOR  CERVICAL LAMINECTOMY C7/T1, T/1-2   TEE WITHOUT CARDIOVERSION N/A 08/09/2012   Procedure: TRANSESOPHAGEAL ECHOCARDIOGRAM (TEE);  Surgeon: Kathlen Brunswick, MD;  [facility];  Service: Cardiovascular;  Laterality: N/A;    Home Medications:  Allergies as of 05/20/2022       Reactions   Chantix [varenicline] Other (See Comments)   Vivid nightmares   Ciprofloxacin Swelling   Nsaids Hives   Pt takes ibuprofen with no reaction   Shrimp [shellfish Allergy] Itching   Makes hands itch    Tylenol [acetaminophen] Other (See Comments)   Patient has cirrhosis of the liver and this causes elevated levels        Medication List        Accurate as of May 20, 2022  2:46 PM. If you have any questions, ask your nurse or doctor.          Accu-Chek Guide test strip Generic drug: glucose blood USE TO TEST ONCE DAILY.   Accu-Chek Guide w/Device Kit USE AS DIRECTED.R   albuterol 108 (90 Base) MCG/ACT inhaler Commonly known as: Ventolin HFA Inhale 2 puffs into the lungs every 6 (six) hours as needed for wheezing or shortness of breath.   Buprenorphine HCl-Naloxone HCl 8-2 MG Film Place 3 Film under the tongue 3 (three) times daily.   Clenpiq 10-3.5-12 MG-GM -GM/175ML Soln Generic drug: Sod Picosulfate-Mag Ox-Cit Acd Take 1 kit by mouth as directed.   furosemide 20 MG tablet Commonly known as: LASIX Take 20 mg by mouth as needed.   hydrOXYzine 50 MG tablet Commonly known as: ATARAX Take 50 mg by mouth at bedtime.   lamoTRIgine 25 MG tablet Commonly known as: LAMICTAL Take 25 mg by mouth 2 (two) times daily.   Lantus SoloStar 100 UNIT/ML Solostar Pen Generic drug: insulin glargine Inject 26 Units into the skin at bedtime.   linaclotide 145 MCG Caps capsule Commonly known as: Linzess Take 1 capsule (145 mcg total) by mouth daily before breakfast.   losartan 50 MG tablet Commonly known as: COZAAR Take 50 mg by mouth daily.   Melatonin 10 MG Tabs Take 10 mg  by mouth at bedtime.   meloxicam 15 MG tablet Commonly known as: MOBIC Take 15 mg by mouth daily.   metFORMIN 1000 MG tablet Commonly known as: GLUCOPHAGE Take 1,000 mg by mouth 2 (two) times daily with a meal.   Mounjaro 7.5 MG/0.5ML Pen Generic drug: tirzepatide Inject 7.5mg  Subcutaneous once weekly for 30 days   pantoprazole 40 MG tablet Commonly known as: PROTONIX Take 40 mg by mouth daily.   phentermine 37.5 MG tablet Commonly known as: ADIPEX-P Take 37.5 mg by mouth daily.   pravastatin 40 MG tablet Commonly known as: PRAVACHOL Take 40 mg by mouth daily.   Sure Comfort Pen Needles 31G X 5 MM Misc Generic drug: Insulin Pen Needle INJECT AS DIRECTED.O   traZODone 50 MG tablet Commonly known as: DESYREL Take 50-100 mg by mouth at bedtime as needed  for sleep.   Trelegy Ellipta 100-62.5-25 MCG/ACT Aepb Generic drug: Fluticasone-Umeclidin-Vilant Inhale 1 puff into the lungs daily in the afternoon.   Vraylar 4.5 MG Caps Generic drug: Cariprazine HCl Take 4.5 mg by mouth daily.        Allergies:  Allergies  Allergen Reactions   Chantix [Varenicline] Other (See Comments)    Vivid nightmares   Ciprofloxacin Swelling   Nsaids Hives    Pt takes ibuprofen with no reaction   Shrimp [Shellfish Allergy] Itching    Makes hands itch    Tylenol [Acetaminophen] Other (See Comments)    Patient has cirrhosis of the liver and this causes elevated levels    Family History: Family History  Problem Relation Age of Onset   Hypertension Mother    Hypertension Father    Cirrhosis Father        Drank some alcohol   Diabetes Brother    Diabetes Other    Cirrhosis Paternal Aunt        No alcohol   Cirrhosis Paternal Aunt        No alcohol   Colon cancer Neg Hx     Social History:  reports that she has been smoking e-cigarettes and cigarettes. She has been smoking an average of .5 packs per day. She has never used smokeless tobacco. She reports that she does not  currently use alcohol. She reports current drug use. Drug: Marijuana.  ROS: All other review of systems were reviewed and are negative except what is noted above in HPI  Physical Exam: BP 99/65   Pulse (!) 108   LMP 01/04/2017 (Approximate)   Constitutional:  Alert and oriented, No acute distress. HEENT: [place] AT, moist mucus membranes.  Trachea midline, no masses. Cardiovascular: No clubbing, cyanosis, or edema. Respiratory: Normal respiratory effort, no increased work of breathing. GI: Abdomen is soft, nontender, nondistended, no abdominal masses GU: No CVA tenderness.  Lymph: No cervical or inguinal lymphadenopathy. Skin: No rashes, bruises or suspicious lesions. Neurologic: Grossly intact, no focal deficits, moving all 4 extremities. Psychiatric: Normal mood and affect.  Laboratory Data: Lab Results  Component Value Date   WBC 7.6 11/06/2021   HGB 15.7 (H) 11/06/2021   HCT 45.3 (H) 11/06/2021   MCV 91.0 11/06/2021   PLT 168 11/06/2021    Lab Results  Component Value Date   CREATININE 1.10 (H) 11/06/2021    No results found for: "PSA"  No results found for: "TESTOSTERONE"  Lab Results  Component Value Date   HGBA1C 7.6 (H) 08/13/2017    Urinalysis    Component Value Date/Time   COLORURINE YELLOW 05/19/2017 1810   APPEARANCEUR Clear 05/23/2021 1209   LABSPEC 1.017 05/19/2017 1810   PHURINE 6.0 05/19/2017 1810   GLUCOSEU Negative 05/23/2021 1209   HGBUR LARGE (A) 05/19/2017 1810   BILIRUBINUR Negative 05/23/2021 1209   KETONESUR negative 10/30/2019 1936   KETONESUR NEGATIVE 05/19/2017 1810   PROTEINUR Trace (A) 05/23/2021 1209   PROTEINUR NEGATIVE 05/19/2017 1810   UROBILINOGEN 0.2 10/30/2019 1936   UROBILINOGEN 1.0 05/31/2014 0949   NITRITE Negative 05/23/2021 1209   NITRITE NEGATIVE 05/19/2017 1810   LEUKOCYTESUR Negative 05/23/2021 1209    Lab Results  Component Value Date   LABMICR Comment 05/23/2021   WBCUA None seen 11/13/2020   LABEPIT 0-10  11/13/2020   MUCUS Present 07/19/2020   BACTERIA Few (A) 11/13/2020    Pertinent Imaging: Renal US 05/05/2022: Images reviewed and discussed with the patient  Results for orders placed  during the hospital encounter of 09/29/15  DG Abd 1 View  Narrative CLINICAL DATA:  Ileus, lower abdominal pain 6 days  EXAM: ABDOMEN - 1 VIEW  COMPARISON:  10/26/2015 CT  FINDINGS: Gas within mildly prominent colon. Moderate stool. No obstruction or free air. No organomegaly or suspicious calcification. Rightward scoliosis in the lumbar spine with degenerative changes.  IMPRESSION: Moderate stool burden. Mild gaseous distention of the colon without evidence of obstruction.   Electronically Signed By: Charlett Nose M.D. On: 10/01/2015 15:19  No results found for this or any previous visit.  No results found for this or any previous visit.  No results found for this or any previous visit.  Results for orders placed during the hospital encounter of 05/05/22  Ultrasound renal complete  Narrative CLINICAL DATA:  Renal abscess follow-up.  EXAM: RENAL / URINARY TRACT ULTRASOUND COMPLETE  COMPARISON:  Renal ultrasound May 20, 2021  FINDINGS: Right Kidney:  Renal measurements: 11.9 x 4.5 x 4.9 cm = volume: 137 mL. A small 15 mm cyst is identified in the upper pole of the right kidney. No follow-up imaging recommended for the cyst.  Left Kidney:  Renal measurements: 10.5 x 5.6 x 4.5 cm = volume: 140 mL. Echogenicity within normal limits. No mass or hydronephrosis visualized.  Bladder:  Appears normal for degree of bladder distention.  Other:  None.  IMPRESSION: No significant abnormalities. No renal abscess identified.   Electronically Signed By: Gerome Sam III M.D. On: 05/05/2022 18:41  No valid procedures specified. No results found for this or any previous visit.  No results found for this or any previous visit.   Assessment & Plan:    1. Acquired  cyst of kidney -followup 2 year with renal US - Urinalysis, Routine w reflex microscopic   No follow-ups on file.  Wilkie Aye, MD  [facility] [place]

## 2022-05-22 LAB — HEPATITIS B SURFACE ANTIBODY,QUALITATIVE: Hep B S Ab: NONREACTIVE

## 2022-05-24 LAB — CBC WITH DIFFERENTIAL/PLATELET
Absolute Monocytes: 400 cells/uL (ref 200–950)
Basophils Absolute: 41 cells/uL (ref 0–200)
Basophils Relative: 0.7 %
Eosinophils Absolute: 168 cells/uL (ref 15–500)
Eosinophils Relative: 2.9 %
HCT: 44.7 % (ref 35.0–45.0)
Hemoglobin: 15.3 g/dL (ref 11.7–15.5)
Lymphs Abs: 1317 cells/uL (ref 850–3900)
MCH: 30.5 pg (ref 27.0–33.0)
MCHC: 34.2 g/dL (ref 32.0–36.0)
MCV: 89.2 fL (ref 80.0–100.0)
MPV: 11.1 fL (ref 7.5–12.5)
Monocytes Relative: 6.9 %
Neutro Abs: 3874 cells/uL (ref 1500–7800)
Neutrophils Relative %: 66.8 %
Platelets: 146 10*3/uL (ref 140–400)
RBC: 5.01 10*6/uL (ref 3.80–5.10)
RDW: 12.9 % (ref 11.0–15.0)
Total Lymphocyte: 22.7 %
WBC: 5.8 10*3/uL (ref 3.8–10.8)

## 2022-05-24 LAB — COMPREHENSIVE METABOLIC PANEL
AG Ratio: 1.4 (calc) (ref 1.0–2.5)
ALT: 37 U/L — ABNORMAL HIGH (ref 6–29)
AST: 37 U/L — ABNORMAL HIGH (ref 10–35)
Albumin: 4.4 g/dL (ref 3.6–5.1)
Alkaline phosphatase (APISO): 125 U/L (ref 37–153)
BUN/Creatinine Ratio: 15 (calc) (ref 6–22)
BUN: 16 mg/dL (ref 7–25)
CO2: 26 mmol/L (ref 20–32)
Calcium: 9.9 mg/dL (ref 8.6–10.4)
Chloride: 105 mmol/L (ref 98–110)
Creat: 1.09 mg/dL — ABNORMAL HIGH (ref 0.50–1.03)
Globulin: 3.1 g/dL (calc) (ref 1.9–3.7)
Glucose, Bld: 75 mg/dL (ref 65–99)
Potassium: 5 mmol/L (ref 3.5–5.3)
Sodium: 140 mmol/L (ref 135–146)
Total Bilirubin: 0.4 mg/dL (ref 0.2–1.2)
Total Protein: 7.5 g/dL (ref 6.1–8.1)

## 2022-05-24 LAB — HCV RNA, QUANT REAL-TIME PCR W/REFLEX
HCV RNA, PCR, QN (Log): <1.18 LogIU/mL
HCV RNA, PCR, QN: <15 IU/mL

## 2022-05-24 LAB — PROTIME-INR
INR: 1.1
Prothrombin Time: 11.2 s (ref 9.0–11.5)

## 2022-05-24 LAB — AFP TUMOR MARKER: AFP-Tumor Marker: 4.4 ng/mL

## 2022-05-26 ENCOUNTER — Encounter: Payer: Self-pay | Admitting: Urology

## 2022-05-27 NOTE — Patient Instructions (Signed)
Jamie Bell  05/27/2022     @PREFPERIOPPHARMACY @   Your procedure is scheduled on  06/05/2022.   Report to [facility] at  1215  P.M.   Call this number if you have problems the morning of surgery:  [phone]  If you experience any cold or flu symptoms such as cough, fever, chills, shortness of breath, etc. between now and your scheduled surgery, please notify us at the above number.   Remember:  Follow the diet and prep instructions given to you by the office.      Your last doses of phentermine and mounjaro should be on 05/28/2022.      Take 13 units of your lantus the night before your procedure.       DO NOT take any medications for diabetes the morning of your procedure.      Use your inhalers before you come and bring your rescue inhaler with you.     Take these medicines the morning of surgery with A SIP OF WATER         cariprazine, lamictal, meloxicam, pantoprazole.     Do not wear jewelry, make-up or nail polish.  Do not wear lotions, powders, or perfumes, or deodorant.  Do not shave 48 hours prior to surgery.  Men may shave face and neck.  Do not bring valuables to the hospital.  [facility] is not responsible for any belongings or valuables.  Contacts, dentures or bridgework may not be worn into surgery.  Leave your suitcase in the car.  After surgery it may be brought to your room.  For patients admitted to the hospital, discharge time will be determined by your treatment team.  Patients discharged the day of surgery will not be allowed to drive home and must have someone with them for 24 hours.    Special instructions:   DO NOT smoke tobacco or vape for 24 hours before your procedure.  Please read over the following fact sheets that you were given. Anesthesia Post-op Instructions and Care and Recovery After Surgery      Upper Endoscopy, Adult, Care After After the procedure, it is common to have a sore throat. It is also common to  have: Mild stomach pain or discomfort. Bloating. Nausea. Follow these instructions at home: The instructions below may help you care for yourself at home. Your health care provider may give you more instructions. If you have questions, ask your health care provider. If you were given a sedative during the procedure, it can affect you for several hours. Do not drive or operate machinery until your health care provider says that it is safe. If you will be going home right after the procedure, plan to have a responsible adult: Take you home from the hospital or clinic. You will not be allowed to drive. Care for you for the time you are told. Follow instructions from your health care provider about what you may eat and drink. Return to your normal activities as told by your health care provider. Ask your health care provider what activities are safe for you. Take over-the-counter and prescription medicines only as told by your health care provider. Contact a health care provider if you: Have a sore throat that lasts longer than one day. Have trouble swallowing. Have a fever. Get help right away if you: Vomit blood or your vomit looks like coffee grounds. Have bloody, black, or tarry stools. Have a very bad sore throat  or you cannot swallow. Have difficulty breathing or very bad pain in your chest or abdomen. These symptoms may be an emergency. Get help right away. Call 911. Do not wait to see if the symptoms will go away. Do not drive yourself to the hospital. Summary After the procedure, it is common to have a sore throat, mild stomach discomfort, bloating, and nausea. If you were given a sedative during the procedure, it can affect you for several hours. Do not drive until your health care provider says that it is safe. Follow instructions from your health care provider about what you may eat and drink. Return to your normal activities as told by your health care provider. This  information is not intended to replace advice given to you by your health care provider. Make sure you discuss any questions you have with your health care provider. Document Revised: 04/23/2021 Document Reviewed: 04/23/2021 Elsevier Patient Education  2023 [employer] Colonoscopy, Adult, Care After The following information offers guidance on how to care for yourself after your procedure. Your health care provider may also give you more specific instructions. If you have problems or questions, contact your health care provider. What can I expect after the procedure? After the procedure, it is common to have: A small amount of blood in your stool for 24 hours after the procedure. Some gas. Mild cramping or bloating of your abdomen. Follow these instructions at home: Eating and drinking  Drink enough fluid to keep your urine pale yellow. Follow instructions from your health care provider about eating or drinking restrictions. Resume your normal diet as told by your health care provider. Avoid heavy or fried foods that are hard to digest. Activity Rest as told by your health care provider. Avoid sitting for a long time without moving. Get up to take short walks every 1-2 hours. This is important to improve blood flow and breathing. Ask for help if you feel weak or unsteady. Return to your normal activities as told by your health care provider. Ask your health care provider what activities are safe for you. Managing cramping and bloating  Try walking around when you have cramps or feel bloated. If directed, apply heat to your abdomen as told by your health care provider. Use the heat source that your health care provider recommends, such as a moist heat pack or a heating pad. Place a towel between your skin and the heat source. Leave the heat on for 20-30 minutes. Remove the heat if your skin turns bright red. This is especially important if you are unable to feel pain, heat, or cold. You  have a greater risk of getting burned. General instructions If you were given a sedative during the procedure, it can affect you for several hours. Do not drive or operate machinery until your health care provider says that it is safe. For the first 24 hours after the procedure: Do not sign important documents. Do not drink alcohol. Do your regular daily activities at a slower pace than normal. Eat soft foods that are easy to digest. Take over-the-counter and prescription medicines only as told by your health care provider. Keep all follow-up visits. This is important. Contact a health care provider if: You have blood in your stool 2-3 days after the procedure. Get help right away if: You have more than a small spotting of blood in your stool. You have large blood clots in your stool. You have swelling of your abdomen. You have nausea or vomiting. You  have a fever. You have increasing pain in your abdomen that is not relieved with medicine. These symptoms may be an emergency. Get help right away. Call 911. Do not wait to see if the symptoms will go away. Do not drive yourself to the hospital. Summary After the procedure, it is common to have a small amount of blood in your stool. You may also have mild cramping and bloating of your abdomen. If you were given a sedative during the procedure, it can affect you for several hours. Do not drive or operate machinery until your health care provider says that it is safe. Get help right away if you have a lot of blood in your stool, nausea or vomiting, a fever, or increased pain in your abdomen. This information is not intended to replace advice given to you by your health care provider. Make sure you discuss any questions you have with your health care provider. Document Revised: 09/04/2020 Document Reviewed: 09/04/2020 Elsevier Patient Education  2023 Elsevier Inc. Monitored Anesthesia Care, Care After The following information offers  guidance on how to care for yourself after your procedure. Your health care provider may also give you more specific instructions. If you have problems or questions, contact your health care provider. What can I expect after the procedure? After the procedure, it is common to have: Tiredness. Little or no memory about what happened during or after the procedure. Impaired judgment when it comes to making decisions. Nausea or vomiting. Some trouble with balance. Follow these instructions at home: For the time period you were told by your health care provider:  Rest. Do not participate in activities where you could fall or become injured. Do not drive or use machinery. Do not drink alcohol. Do not take sleeping pills or medicines that cause drowsiness. Do not make important decisions or sign legal documents. Do not take care of children on your own. Medicines Take over-the-counter and prescription medicines only as told by your health care provider. If you were prescribed antibiotics, take them as told by your health care provider. Do not stop using the antibiotic even if you start to feel better. Eating and drinking Follow instructions from your health care provider about what you may eat and drink. Drink enough fluid to keep your urine pale yellow. If you vomit: Drink clear fluids slowly and in small amounts as you are able. Clear fluids include water, ice chips, low-calorie sports drinks, and fruit juice that has water added to it (diluted fruit juice). Eat light and bland foods in small amounts as you are able. These foods include bananas, applesauce, rice, lean meats, toast, and crackers. General instructions  Have a responsible adult stay with you for the time you are told. It is important to have someone help care for you until you are awake and alert. If you have sleep apnea, surgery and some medicines can increase your risk for breathing problems. Follow instructions from your  health care provider about wearing your sleep device: When you are sleeping. This includes during daytime naps. While taking prescription pain medicines, sleeping medicines, or medicines that make you drowsy. Do not use any products that contain nicotine or tobacco. These products include cigarettes, chewing tobacco, and vaping devices, such as e-cigarettes. If you need help quitting, ask your health care provider. Contact a health care provider if: You feel nauseous or vomit every time you eat or drink. You feel light-headed. You are still sleepy or having trouble with balance after 24 hours.  You get a rash. You have a fever. You have redness or swelling around the IV site. Get help right away if: You have trouble breathing. You have new confusion after you get home. These symptoms may be an emergency. Get help right away. Call 911. Do not wait to see if the symptoms will go away. Do not drive yourself to the hospital. This information is not intended to replace advice given to you by your health care provider. Make sure you discuss any questions you have with your health care provider. Document Revised: 06/09/2021 Document Reviewed: 06/09/2021 Elsevier Patient Education  2023 [employer].

## 2022-05-28 ENCOUNTER — Encounter (HOSPITAL_COMMUNITY)
Admission: RE | Admit: 2022-05-28 | Discharge: 2022-05-28 | Disposition: A | Discharge status: Home / Self Care | Payer: 59 | Source: Ambulatory Visit | Attending: Internal Medicine | Admitting: Internal Medicine

## 2022-05-28 VITALS — BP 100/87 | HR 102 | Temp 97.5°F | Resp 18 | Ht 64.0 in | Wt 195.3 lb

## 2022-05-28 DIAGNOSIS — I10 Essential (primary) hypertension: Secondary | ICD-10-CM

## 2022-05-28 DIAGNOSIS — Z01812 Encounter for preprocedural laboratory examination: Secondary | ICD-10-CM | POA: Diagnosis not present

## 2022-06-05 ENCOUNTER — Encounter (HOSPITAL_COMMUNITY): Payer: Self-pay

## 2022-06-05 ENCOUNTER — Ambulatory Visit (HOSPITAL_COMMUNITY)
Admission: RE | Admit: 2022-06-05 | Discharge: 2022-06-05 | Disposition: A | Discharge status: Home / Self Care | Payer: 59 | Attending: Internal Medicine | Admitting: Internal Medicine

## 2022-06-05 ENCOUNTER — Encounter (HOSPITAL_COMMUNITY)
Admission: RE | Disposition: A | Discharge status: Home / Self Care | Payer: Self-pay | Source: Home / Self Care | Attending: Internal Medicine

## 2022-06-05 ENCOUNTER — Ambulatory Visit (HOSPITAL_BASED_OUTPATIENT_CLINIC_OR_DEPARTMENT_OTHER): Payer: 59 | Admitting: Anesthesiology

## 2022-06-05 ENCOUNTER — Ambulatory Visit (HOSPITAL_COMMUNITY): Payer: 59 | Admitting: Anesthesiology

## 2022-06-05 DIAGNOSIS — Z7984 Long term (current) use of oral hypoglycemic drugs: Secondary | ICD-10-CM | POA: Diagnosis not present

## 2022-06-05 DIAGNOSIS — K7460 Unspecified cirrhosis of liver: Secondary | ICD-10-CM | POA: Insufficient documentation

## 2022-06-05 DIAGNOSIS — I10 Essential (primary) hypertension: Secondary | ICD-10-CM | POA: Diagnosis not present

## 2022-06-05 DIAGNOSIS — Z1211 Encounter for screening for malignant neoplasm of colon: Secondary | ICD-10-CM | POA: Diagnosis not present

## 2022-06-05 DIAGNOSIS — F32A Depression, unspecified: Secondary | ICD-10-CM | POA: Insufficient documentation

## 2022-06-05 DIAGNOSIS — Z7985 Long-term (current) use of injectable non-insulin antidiabetic drugs: Secondary | ICD-10-CM | POA: Diagnosis not present

## 2022-06-05 DIAGNOSIS — F17210 Nicotine dependence, cigarettes, uncomplicated: Secondary | ICD-10-CM | POA: Insufficient documentation

## 2022-06-05 DIAGNOSIS — D124 Benign neoplasm of descending colon: Secondary | ICD-10-CM | POA: Insufficient documentation

## 2022-06-05 DIAGNOSIS — D123 Benign neoplasm of transverse colon: Secondary | ICD-10-CM | POA: Insufficient documentation

## 2022-06-05 DIAGNOSIS — K219 Gastro-esophageal reflux disease without esophagitis: Secondary | ICD-10-CM | POA: Insufficient documentation

## 2022-06-05 DIAGNOSIS — D122 Benign neoplasm of ascending colon: Secondary | ICD-10-CM | POA: Insufficient documentation

## 2022-06-05 DIAGNOSIS — D126 Benign neoplasm of colon, unspecified: Secondary | ICD-10-CM

## 2022-06-05 DIAGNOSIS — Z794 Long term (current) use of insulin: Secondary | ICD-10-CM | POA: Insufficient documentation

## 2022-06-05 DIAGNOSIS — D120 Benign neoplasm of cecum: Secondary | ICD-10-CM | POA: Diagnosis not present

## 2022-06-05 DIAGNOSIS — F419 Anxiety disorder, unspecified: Secondary | ICD-10-CM | POA: Insufficient documentation

## 2022-06-05 DIAGNOSIS — K648 Other hemorrhoids: Secondary | ICD-10-CM | POA: Diagnosis not present

## 2022-06-05 DIAGNOSIS — Z86010 Personal history of colonic polyps: Secondary | ICD-10-CM

## 2022-06-05 DIAGNOSIS — E119 Type 2 diabetes mellitus without complications: Secondary | ICD-10-CM | POA: Diagnosis not present

## 2022-06-05 DIAGNOSIS — K3189 Other diseases of stomach and duodenum: Secondary | ICD-10-CM | POA: Diagnosis not present

## 2022-06-05 DIAGNOSIS — K766 Portal hypertension: Secondary | ICD-10-CM | POA: Diagnosis not present

## 2022-06-05 DIAGNOSIS — G709 Myoneural disorder, unspecified: Secondary | ICD-10-CM | POA: Diagnosis not present

## 2022-06-05 DIAGNOSIS — K2950 Unspecified chronic gastritis without bleeding: Secondary | ICD-10-CM

## 2022-06-05 DIAGNOSIS — K2970 Gastritis, unspecified, without bleeding: Secondary | ICD-10-CM | POA: Insufficient documentation

## 2022-06-05 DIAGNOSIS — R12 Heartburn: Secondary | ICD-10-CM | POA: Diagnosis present

## 2022-06-05 HISTORY — PX: COLONOSCOPY WITH PROPOFOL: SHX5780

## 2022-06-05 HISTORY — PX: POLYPECTOMY: SHX5525

## 2022-06-05 HISTORY — PX: BIOPSY: SHX5522

## 2022-06-05 HISTORY — PX: ESOPHAGOGASTRODUODENOSCOPY (EGD) WITH PROPOFOL: SHX5813

## 2022-06-05 LAB — GLUCOSE, CAPILLARY: Glucose-Capillary: 87 mg/dL (ref 70–99)

## 2022-06-05 SURGERY — COLONOSCOPY WITH PROPOFOL
Anesthesia: General

## 2022-06-05 MED ORDER — LACTATED RINGERS IV SOLN: INTRAVENOUS | Status: DC

## 2022-06-05 MED ORDER — PROPOFOL 500 MG/50ML IV EMUL
INTRAVENOUS | Status: DC | PRN
  Administered 2022-06-05: 150 ug/kg/min via INTRAVENOUS

## 2022-06-05 MED ORDER — LIDOCAINE HCL (CARDIAC) PF 100 MG/5ML IV SOSY
PREFILLED_SYRINGE | INTRAVENOUS | Status: DC | PRN
  Administered 2022-06-05: 50 mg via INTRATRACHEAL

## 2022-06-05 MED ORDER — PHENYLEPHRINE 80 MCG/ML (10ML) SYRINGE FOR IV PUSH (FOR BLOOD PRESSURE SUPPORT)
PREFILLED_SYRINGE | INTRAVENOUS | Status: DC | PRN
  Administered 2022-06-05 (×2): 160 ug via INTRAVENOUS

## 2022-06-05 MED ORDER — PROPOFOL 10 MG/ML IV BOLUS
INTRAVENOUS | Status: DC | PRN
  Administered 2022-06-05: 90 mg via INTRAVENOUS
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
## 2022-06-05 NOTE — Anesthesia Preprocedure Evaluation (Signed)
Anesthesia Evaluation  Patient identified by MRN, date of birth, ID band Patient awake    Reviewed: Allergy & Precautions, H&P , NPO status , Patient's Chart, lab work & pertinent test results, reviewed documented beta blocker date and time   Airway Mallampati: II  TM Distance: >3 FB Neck ROM: full    Dental no notable dental hx.    Pulmonary neg pulmonary ROS, shortness of breath, pneumonia, Current Smoker   Pulmonary exam normal breath sounds clear to auscultation       Cardiovascular Exercise Tolerance: Good hypertension, negative cardio ROS  Rhythm:regular Rate:Normal     Neuro/Psych  PSYCHIATRIC DISORDERS Anxiety Depression     Neuromuscular disease negative neurological ROS  negative psych ROS   GI/Hepatic negative GI ROS,GERD  ,,(+) Cirrhosis       , Hepatitis -  Endo/Other  negative endocrine ROSdiabetes    Renal/GU Renal diseasenegative Renal ROS  negative genitourinary   Musculoskeletal   Abdominal   Peds  Hematology negative hematology ROS (+)   Anesthesia Other Findings   Reproductive/Obstetrics negative OB ROS                             Anesthesia Physical Anesthesia Plan  ASA: 3  Anesthesia Plan: General   Post-op Pain Management:    Induction:   PONV Risk Score and Plan: Propofol infusion  Airway Management Planned:   Additional Equipment:   Intra-op Plan:   Post-operative Plan:   Informed Consent: I have reviewed the patients History and Physical, chart, labs and discussed the procedure including the risks, benefits and alternatives for the proposed anesthesia with the patient or authorized representative who has indicated his/her understanding and acceptance.     Dental Advisory Given  Plan Discussed with: CRNA  Anesthesia Plan Comments:        Anesthesia Quick Evaluation

## 2022-06-05 NOTE — Discharge Instructions (Signed)
EGD Discharge instructions Please read the instructions outlined below and refer to this sheet in the next few weeks. These discharge instructions provide you with general information on caring for yourself after you leave the hospital. Your doctor may also give you specific instructions. While your treatment has been planned according to the most current medical practices available, unavoidable complications occasionally occur. If you have any problems or questions after discharge, please call your doctor. ACTIVITY You may resume your regular activity but move at a slower pace for the next 24 hours.  Take frequent rest periods for the next 24 hours.  Walking will help expel (get rid of) the air and reduce the bloated feeling in your abdomen.  No driving for 24 hours (because of the anesthesia (medicine) used during the test).  You may shower.  Do not sign any important legal documents or operate any machinery for 24 hours (because of the anesthesia used during the test).  NUTRITION Drink plenty of fluids.  You may resume your normal diet.  Begin with a light meal and progress to your normal diet.  Avoid alcoholic beverages for 24 hours or as instructed by your caregiver.  MEDICATIONS You may resume your normal medications unless your caregiver tells you otherwise.  WHAT YOU CAN EXPECT TODAY You may experience abdominal discomfort such as a feeling of fullness or "gas" pains.  FOLLOW-UP Your doctor will discuss the results of your test with you.  SEEK IMMEDIATE MEDICAL ATTENTION IF ANY OF THE FOLLOWING OCCUR: Excessive nausea (feeling sick to your stomach) and/or vomiting.  Severe abdominal pain and distention (swelling).  Trouble swallowing.  Temperature over 101 F (37.8 C).  Rectal bleeding or vomiting of blood.      Colonoscopy Discharge Instructions  Read the instructions outlined below and refer to this sheet in the next few weeks. These discharge instructions provide you  with general information on caring for yourself after you leave the hospital. Your doctor may also give you specific instructions. While your treatment has been planned according to the most current medical practices available, unavoidable complications occasionally occur.   ACTIVITY You may resume your regular activity, but move at a slower pace for the next 24 hours.  Take frequent rest periods for the next 24 hours.  Walking will help get rid of the air and reduce the bloated feeling in your belly (abdomen).  No driving for 24 hours (because of the medicine (anesthesia) used during the test).   Do not sign any important legal documents or operate any machinery for 24 hours (because of the anesthesia used during the test).  NUTRITION Drink plenty of fluids.  You may resume your normal diet as instructed by your doctor.  Begin with a light meal and progress to your normal diet. Heavy or fried foods are harder to digest and may make you feel sick to your stomach (nauseated).  Avoid alcoholic beverages for 24 hours or as instructed.  MEDICATIONS You may resume your normal medications unless your doctor tells you otherwise.  WHAT YOU CAN EXPECT TODAY Some feelings of bloating in the abdomen.  Passage of more gas than usual.  Spotting of blood in your stool or on the toilet paper.  IF YOU HAD POLYPS REMOVED DURING THE COLONOSCOPY: No aspirin products for 7 days or as instructed.  No alcohol for 7 days or as instructed.  Eat a soft diet for the next 24 hours.  FINDING OUT THE RESULTS OF YOUR TEST Not all test results  are available during your visit. If your test results are not back during the visit, make an appointment with your caregiver to find out the results. Do not assume everything is normal if you have not heard from your caregiver or the medical facility. It is important for you to follow up on all of your test results.  SEEK IMMEDIATE MEDICAL ATTENTION IF: You have more than a  spotting of blood in your stool.  Your belly is swollen (abdominal distention).  You are nauseated or vomiting.  You have a temperature over 101.  You have abdominal pain or discomfort that is severe or gets worse throughout the day.    Your EGD revealed mild amount inflammation in your stomach.  I took biopsies of this to rule out infection with a bacteria called H. pylori.  Await pathology results, my office will contact you. No evidence of esophageal varices. Small bowel normal.  Repeat EGD in 3 years   Your colonoscopy revealed 4 polyp(s) which I removed successfully.  2 were on the larger side. Await pathology results, my office will contact you. I recommend repeating colonoscopy in 3 years for surveillance purposes.   Follow up in Gi office in 3 months  I hope you have a great rest of your week!  Hennie Duos. Marletta Lor, D.O. Gastroenterology and Hepatology [facility]

## 2022-06-05 NOTE — Transfer of Care (Signed)
Immediate Anesthesia Transfer of Care Note  Patient: Jamie Bell  Procedure(s) Performed: COLONOSCOPY WITH PROPOFOL ESOPHAGOGASTRODUODENOSCOPY (EGD) WITH PROPOFOL BIOPSY POLYPECTOMY  Patient Location: Short Stay  Anesthesia Type:General  Level of Consciousness: awake, alert , oriented, and patient cooperative  Airway & Oxygen Therapy: Patient Spontanous Breathing and Patient connected to nasal cannula oxygen  Post-op Assessment: Report given to RN, Post -op Vital signs reviewed and stable, and Patient moving all extremities  Post vital signs: Reviewed and stable  Last Vitals:  Vitals Value Taken Time  BP    Temp    Pulse    Resp    SpO2      Last Pain:  Vitals:   06/05/22 1342  TempSrc:   PainSc: 7          Complications: No notable events documented.

## 2022-06-05 NOTE — Op Note (Signed)
[facility] Patient Name: Jamie Bell Procedure Date: 06/05/2022 1:33 PM MRN: [id number] Date of Birth: 02/26/1970 Attending MD: Hennie Duos. Marletta Lor , [place], [id number] CSN: [id number] Age: 52 Admit Type: Outpatient Procedure:                Upper GI endoscopy Indications:              Heartburn, Cirrhosis rule out esophageal varices,                            Early satiety Providers:                Hennie Duos. Marletta Lor, DO, Angelica Ran, Pandora Leiter,                            Technician Referring MD:              Medicines:                See the Anesthesia note for documentation of the                            administered medications Complications:            No immediate complications. Estimated Blood Loss:     Estimated blood loss was minimal. Procedure:                Pre-Anesthesia Assessment:                           - The anesthesia plan was to use monitored                            anesthesia care (MAC).                           After obtaining informed consent, the endoscope was                            passed under direct vision. Throughout the                            procedure, the patient's blood pressure, pulse, and                            oxygen saturations were monitored continuously. The                            [id number]-H190 ([id number]) scope was introduced through the                            mouth, and advanced to the second part of duodenum.                            The upper GI endoscopy was accomplished without                            difficulty. The patient tolerated the procedure  well. Scope In: 1:47:09 PM Scope Out: 1:52:02 PM Total Procedure Duration: 0 hours 4 minutes 53 seconds  Findings:      There is no endoscopic evidence of varices in the entire esophagus.      Mild portal hypertensive gastropathy was found in the stomach.      Patchy mild inflammation characterized by erythema was found in the        gastric body. Biopsies were taken with a cold forceps for Helicobacter       pylori testing.      The duodenal bulb, first portion of the duodenum and second portion of       the duodenum were normal. Impression:               - Portal hypertensive gastropathy.                           - Gastritis. Biopsied.                           - Normal duodenal bulb, first portion of the                            duodenum and second portion of the duodenum. Moderate Sedation:      Per Anesthesia Care Recommendation:           - Patient has a contact number available for                            emergencies. The signs and symptoms of potential                            delayed complications were discussed with the                            patient. Return to normal activities tomorrow.                            Written discharge instructions were provided to the                            patient.                           - Resume previous diet.                           - Continue present medications.                           - Await pathology results.                           - Repeat upper endoscopy in 3 years for screening                            purposes.                           -  Return to GI clinic in 3 months. Procedure Code(s):        --- Professional ---                           [phone], Esophagogastroduodenoscopy, flexible,                            transoral; with biopsy, single or multiple Diagnosis Code(s):        --- Professional ---                           K76.6, Portal hypertension                           K31.89, Other diseases of stomach and duodenum                           K29.70, Gastritis, unspecified, without bleeding                           R12, Heartburn                           K74.60, Unspecified cirrhosis of liver                           R68.81, Early satiety CPT copyright 2022 American Medical Association. All rights reserved. The codes  documented in this report are preliminary and upon coder review may  be revised to meet current compliance requirements. Hennie Duos. Marletta Lor, DO Hennie Duos. Marletta Lor, DO 06/05/2022 1:55:41 PM This report has been signed electronically. Number of Addenda: 0

## 2022-06-05 NOTE — Op Note (Signed)
[facility] Patient Name: Jamie Bell Procedure Date: 06/05/2022 1:32 PM MRN: [id number] Date of Birth: March 31, 1970 Attending MD: Hennie Duos. Marletta Lor , [place], [id number] CSN: [id number] Age: 52 Admit Type: Outpatient Procedure:                Colonoscopy Indications:              Surveillance: Personal history of adenomatous                            polyps on last colonoscopy > 5 years ago Providers:                Hennie Duos. Marletta Lor, DO, Angelica Ran, Pandora Leiter,                            Technician Referring MD:              Medicines:                See the Anesthesia note for documentation of the                            administered medications Complications:            No immediate complications. Estimated Blood Loss:     Estimated blood loss was minimal. Procedure:                Pre-Anesthesia Assessment:                           - The anesthesia plan was to use monitored                            anesthesia care (MAC).                           After obtaining informed consent, the colonoscope                            was passed under direct vision. Throughout the                            procedure, the patient's blood pressure, pulse, and                            oxygen saturations were monitored continuously. The                            PCF-HQ190L ([id number]) scope was introduced through                            the anus and advanced to the the cecum, identified                            by appendiceal orifice and ileocecal valve. The                            colonoscopy was performed without  difficulty. The                            patient tolerated the procedure well. The quality                            of the bowel preparation was evaluated using the                            BBPS [facility] Bowel Preparation Scale) with scores                            of: Right Colon = 3, Transverse Colon = 3 and Left                            Colon = 3  (entire mucosa seen well with no residual                            staining, small fragments of stool or opaque                            liquid). The total BBPS score equals 9. Scope In: 1:57:05 PM Scope Out: 2:15:35 PM Scope Withdrawal Time: 0 hours 15 minutes 35 seconds  Total Procedure Duration: 0 hours 18 minutes 30 seconds  Findings:      Non-bleeding internal hemorrhoids were found during endoscopy.      A 17 mm polyp was found in the cecum. The polyp was sessile. The polyp       was removed with a cold snare. Resection and retrieval were complete.      A 13 mm polyp was found in the ascending colon. The polyp was sessile.       The polyp was removed with a cold snare. Resection and retrieval were       complete.      Two sessile polyps were found in the transverse colon. The polyps were 5       to 7 mm in size. These polyps were removed with a cold snare. Resection       and retrieval were complete.      The exam was otherwise without abnormality. Impression:               - Non-bleeding internal hemorrhoids.                           - One 17 mm polyp in the cecum, removed with a cold                            snare. Resected and retrieved.                           - One 13 mm polyp in the ascending colon, removed                            with a cold snare. Resected and retrieved.                           -  Two 5 to 7 mm polyps in the transverse colon,                            removed with a cold snare. Resected and retrieved.                           - The examination was otherwise normal. Moderate Sedation:      Per Anesthesia Care Recommendation:           - Patient has a contact number available for                            emergencies. The signs and symptoms of potential                            delayed complications were discussed with the                            patient. Return to normal activities tomorrow.                            Written  discharge instructions were provided to the                            patient.                           - Resume previous diet.                           - Continue present medications.                           - Await pathology results.                           - Repeat colonoscopy in 3 years for surveillance.                           - Return to GI clinic in 3 months. Procedure Code(s):        --- Professional ---                           [phone], Colonoscopy, flexible; with removal of                            tumor(s), polyp(s), or other lesion(s) by snare                            technique Diagnosis Code(s):        --- Professional ---                           Z86.010, Personal history of colonic polyps  D12.0, Benign neoplasm of cecum                           D12.2, Benign neoplasm of ascending colon                           D12.3, Benign neoplasm of transverse colon (hepatic                            flexure or splenic flexure)                           K64.8, Other hemorrhoids CPT copyright 2022 American Medical Association. All rights reserved. The codes documented in this report are preliminary and upon coder review may  be revised to meet current compliance requirements. Hennie Duos. Marletta Lor, DO Hennie Duos. Marletta Lor, DO 06/05/2022 2:21:12 PM This report has been signed electronically. Number of Addenda: 0

## 2022-06-06 NOTE — Anesthesia Postprocedure Evaluation (Signed)
Anesthesia Post Note  Patient: Wykesha Kraner  Procedure(s) Performed: COLONOSCOPY WITH PROPOFOL ESOPHAGOGASTRODUODENOSCOPY (EGD) WITH PROPOFOL BIOPSY POLYPECTOMY  Patient location during evaluation: Phase II Anesthesia Type: General Level of consciousness: awake Pain management: pain level controlled Vital Signs Assessment: post-procedure vital signs reviewed and stable Respiratory status: spontaneous breathing and respiratory function stable Cardiovascular status: blood pressure returned to baseline and stable Postop Assessment: no headache and no apparent nausea or vomiting Anesthetic complications: no Comments: Late entry   No notable events documented.   Last Vitals:  Vitals:   06/05/22 1211 06/05/22 1422  BP: 100/65 106/84  Pulse: 98 87  Resp: 12 18  Temp: 36.6 C 36.5 C  SpO2: 99% 99%    Last Pain:  Vitals:   06/05/22 1422  TempSrc:   PainSc: 0-No pain                 Windell Norfolk

## 2022-06-09 LAB — SURGICAL PATHOLOGY

## 2022-06-11 ENCOUNTER — Encounter (HOSPITAL_COMMUNITY): Payer: Self-pay | Admitting: Internal Medicine

## 2022-06-25 ENCOUNTER — Telehealth (INDEPENDENT_AMBULATORY_CARE_PROVIDER_SITE_OTHER): Payer: 59 | Admitting: Pulmonary Disease

## 2022-06-25 ENCOUNTER — Encounter: Payer: Self-pay | Admitting: Pulmonary Disease

## 2022-06-25 VITALS — Ht 65.0 in | Wt 195.0 lb

## 2022-06-25 DIAGNOSIS — J4489 Other specified chronic obstructive pulmonary disease: Secondary | ICD-10-CM | POA: Diagnosis not present

## 2022-06-25 DIAGNOSIS — Z716 Tobacco abuse counseling: Secondary | ICD-10-CM | POA: Diagnosis not present

## 2022-06-25 MED ORDER — TRELEGY ELLIPTA 200-62.5-25 MCG/ACT IN AEPB: 1.0000 | INHALATION_SPRAY | Freq: Every day | RESPIRATORY_TRACT | 5 refills | Status: AC

## 2022-06-25 NOTE — Patient Instructions (Signed)
Follow up in 1 year.

## 2022-06-25 NOTE — Progress Notes (Signed)
[facility]  Chief Complaint  Patient presents with   Follow-up    Pt f/u states that she has been doing well. She is curious if she could start the trelegy 200 instead of 100. Does not use oxygen.     Constitutional:  Ht 5\' 5"  (1.651 m)   Wt 195 lb (88.5 kg)   LMP 01/04/2017 (Approximate)   BMI 32.45 kg/m   Past Medical History:  NASH with cirrhosis, GERD, Chronic pain, Depression, Diabetes, HTN, MRSA bacteremia 2014, Polycystic ovarian syndrome, Pneumonia 2016, Sciatica  Past Surgical History:  She  has a past surgical history that includes Back surgery; Cholecystectomy; Neck surgery; Carpal tunnel release; TEE without cardioversion (N/A, 08/09/2012); Peripherally inserted central catheter insertion (Right, 2007); Breast surgery (Left); Posterior cervical laminectomy (N/A, 11/22/2013); Cataract extraction w/PHACO (Left, 02/04/2015); Esophagogastroduodenoscopy (2007); Colonoscopy with propofol (N/A, 11/12/2015); Esophagogastroduodenoscopy (egd) with propofol (N/A, 11/12/2015); esophageal banding (N/A, 11/12/2015); polypectomy (11/12/2015); Incision and drainage wound with tendon repair (Right, 07/03/2016); Anterior cervical decomp/discectomy fusion (N/A, 05/28/2017); Breast excisional biopsy (Left); Esophagogastroduodenoscopy (egd) with propofol (N/A, 10/17/2019); Cataract extraction w/PHACO (Right, 05/24/2020); IR Radiologist Eval & Mgmt (09/24/2020); Colonoscopy with propofol (N/A, 06/05/2022); Esophagogastroduodenoscopy (egd) with propofol (N/A, 06/05/2022); biopsy (06/05/2022); and polypectomy (06/05/2022).  Brief Summary:  Jamie Bell is a 51 y.o. female smoker with COPD and chronic bronchitis.      Subjective:  Virtual Visit via Video Note  I connected with Clarisa Fling on 06/25/22 at  2:30 PM EDT by a video enabled telemedicine application and verified that I am speaking with the correct person using two identifiers.  Location: Patient:  home Provider: medical office   I discussed the limitations of evaluation and management by telemedicine and the availability of in person appointments. The patient expressed understanding and agreed to proceed.  History of Present Illness: She used her father's trelegy 200.  Felt like this worked better.  Not having cough, wheeze, or sputum.  Sleeping okay.  She plans to quit smoking and vaping in June.    Physical Exam:  Speech fluent, no distress.    Pulmonary testing:  PFT 11/15/12 >> FEV1 3.28 (109%), FEV1% 80, TLC 5.93 (117%), DLCO 73%  Chest Imaging:  LDCT chest 09/09/21 >> coronary atherosclerosis, mild emphysema, changes of cirrhosis  Cardiac Tests:  Echo 06/03/14 >> EF 55%  Social History:  She  reports that she has been smoking e-cigarettes and cigarettes. She has been smoking an average of .5 packs per day. She has never used smokeless tobacco. She reports that she does not currently use alcohol. She reports current drug use. Drug: Marijuana.  Family History:  Her family history includes Cirrhosis in her father, paternal aunt, and paternal aunt; Diabetes in her brother and another family member; Hypertension in her father and mother.     Assessment/Plan:   COPD with chronic bronchitis. - will switch to trelegy 200 one puff daily - prn albuterol  Tobacco abuse, vaping. - spent 5 minutes discussing smoking cessation options - she plans to quit cold [place] in June 2024 - she will get follow up low dose CT chest set up through her PCP  NASH with cirrhosis. - followed by Dr. Earnest Bailey with [facility]  Time Spent Involved in Patient Care on Day of Examination:   I discussed the assessment and treatment plan with the patient. The patient was provided an opportunity to ask questions and all were answered. The patient agreed with the plan and demonstrated an  understanding of the instructions.   The patient was advised to call back or seek an  in-person evaluation if the symptoms worsen or if the condition fails to improve as anticipated.  I provided 25 minutes of non-face-to-face time during this encounter.   Follow up:   Patient Instructions  Follow up in 1 year  Medication List:   Allergies as of 06/25/2022       Reactions   Chantix [varenicline] Other (See Comments)   Vivid nightmares   Ciprofloxacin Swelling   Nsaids Hives   Pt takes ibuprofen with no reaction   Shrimp [shellfish Allergy] Itching   Makes hands itch    Tylenol [acetaminophen] Other (See Comments)   Patient has cirrhosis of the liver and this causes elevated levels        Medication List        Accurate as of Jun 25, 2022  3:31 PM. If you have any questions, ask your nurse or doctor.          STOP taking these medications    Trelegy Ellipta 100-62.5-25 MCG/ACT Aepb Generic drug: Fluticasone-Umeclidin-Vilant Replaced by: Trelegy Ellipta 200-62.5-25 MCG/ACT Aepb Stopped by: Coralyn Helling, MD       TAKE these medications    Accu-Chek Guide test strip Generic drug: glucose blood USE TO TEST ONCE DAILY.   Accu-Chek Guide w/Device Kit USE AS DIRECTED.R   albuterol 108 (90 Base) MCG/ACT inhaler Commonly known as: Ventolin HFA Inhale 2 puffs into the lungs every 6 (six) hours as needed for wheezing or shortness of breath.   BIOTIN PO Take 1,500 mcg by mouth daily.   Cariprazine HCl 6 MG Caps Take 6 mg by mouth daily.   hydrOXYzine 50 MG tablet Commonly known as: ATARAX Take 100 mg by mouth at bedtime.   lamoTRIgine 100 MG tablet Commonly known as: LAMICTAL Take 100 mg by mouth daily.   Lantus SoloStar 100 UNIT/ML Solostar Pen Generic drug: insulin glargine Inject 26 Units into the skin at bedtime.   linaclotide 145 MCG Caps capsule Commonly known as: Linzess Take 1 capsule (145 mcg total) by mouth daily before breakfast. What changed:  when to take this reasons to take this   losartan 50 MG tablet Commonly  known as: COZAAR Take 50 mg by mouth daily.   Melatonin 10 MG Tabs Take 20 mg by mouth at bedtime.   meloxicam 15 MG tablet Commonly known as: MOBIC Take 15 mg by mouth daily.   metFORMIN 1000 MG tablet Commonly known as: GLUCOPHAGE Take 1,000 mg by mouth 2 (two) times daily with a meal.   pantoprazole 40 MG tablet Commonly known as: PROTONIX Take 40 mg by mouth 2 (two) times daily.   phentermine 37.5 MG tablet Commonly known as: ADIPEX-P Take 37.5 mg by mouth daily.   pravastatin 40 MG tablet Commonly known as: PRAVACHOL Take 40 mg by mouth at bedtime.   Sure Comfort Pen Needles 31G X 5 MM Misc Generic drug: Insulin Pen Needle INJECT AS DIRECTED.O   tirzepatide 15 MG/0.5ML Pen Commonly known as: MOUNJARO Inject 15 mg into the skin once a week.   traZODone 50 MG tablet Commonly known as: DESYREL Take 100 mg by mouth at bedtime.   Trelegy Ellipta 200-62.5-25 MCG/ACT Aepb Generic drug: Fluticasone-Umeclidin-Vilant Inhale 1 puff into the lungs daily in the afternoon. Replaces: Trelegy Ellipta 100-62.5-25 MCG/ACT Aepb Started by: Coralyn Helling, MD        Signature:  Coralyn Helling, MD [facility] Pager - [phone] 06/25/2022, 3:31 PM

## 2022-08-10 ENCOUNTER — Encounter: Payer: Self-pay | Admitting: Gastroenterology

## 2022-08-20 ENCOUNTER — Other Ambulatory Visit (HOSPITAL_COMMUNITY): Payer: Self-pay | Admitting: Family Medicine

## 2022-08-20 DIAGNOSIS — Z720 Tobacco use: Secondary | ICD-10-CM

## 2022-08-31 ENCOUNTER — Other Ambulatory Visit (HOSPITAL_COMMUNITY): Payer: Self-pay | Admitting: Family Medicine

## 2022-08-31 DIAGNOSIS — Z1231 Encounter for screening mammogram for malignant neoplasm of breast: Secondary | ICD-10-CM

## 2022-09-17 ENCOUNTER — Ambulatory Visit (HOSPITAL_COMMUNITY): Admission: RE | Admit: 2022-09-17 | Payer: 59 | Source: Ambulatory Visit

## 2022-10-05 ENCOUNTER — Encounter: Payer: Self-pay | Admitting: Internal Medicine

## 2022-10-07 ENCOUNTER — Encounter (HOSPITAL_COMMUNITY): Payer: Self-pay

## 2022-10-07 ENCOUNTER — Ambulatory Visit (HOSPITAL_COMMUNITY): Admission: RE | Admit: 2022-10-07 | Payer: 59 | Source: Ambulatory Visit

## 2022-11-28 ENCOUNTER — Other Ambulatory Visit: Payer: Self-pay | Admitting: Gastroenterology

## 2022-12-01 ENCOUNTER — Ambulatory Visit: Payer: 59 | Admitting: Gastroenterology

## 2023-02-09 ENCOUNTER — Telehealth: Payer: Self-pay | Admitting: *Deleted

## 2023-02-09 ENCOUNTER — Ambulatory Visit (INDEPENDENT_AMBULATORY_CARE_PROVIDER_SITE_OTHER): Payer: 59 | Admitting: Gastroenterology

## 2023-02-09 ENCOUNTER — Encounter: Payer: Self-pay | Admitting: Gastroenterology

## 2023-02-09 VITALS — BP 104/72 | HR 101 | Temp 98.0°F | Ht 66.0 in | Wt 210.8 lb

## 2023-02-09 DIAGNOSIS — R1013 Epigastric pain: Secondary | ICD-10-CM | POA: Diagnosis not present

## 2023-02-09 DIAGNOSIS — R197 Diarrhea, unspecified: Secondary | ICD-10-CM | POA: Diagnosis not present

## 2023-02-09 DIAGNOSIS — K7460 Unspecified cirrhosis of liver: Secondary | ICD-10-CM

## 2023-02-09 NOTE — Telephone Encounter (Signed)
 [employer] PA:  CPT Code 16109 Description: CT ABDOMEN & PELVIS W/ Case Number: [id number] Review Date: 02/09/2023 4:42:45 PM Expiration Date: N/A Status: This member's benefit plan did not require a prior authorization for this request.

## 2023-02-09 NOTE — Patient Instructions (Signed)
 Please complete labs when you are at the hospital for your CT scan. We will work on getting that scheduled for you.  If your symptoms worsen, you need to go to the ER.

## 2023-02-09 NOTE — Progress Notes (Signed)
 GI Office Note    Referring Provider: Halbert Mariano SQUIBB, DO Primary Care Physician:  Halbert Mariano SQUIBB, DO  Primary Gastroenterologist: Carlin POUR. Cindie, DO   Chief Complaint   Chief Complaint  Patient presents with   Follow-up    Having some upper abdominal pain that feels sharp at times when she breaths in.     History of Present Illness   Jamie Bell is a 53 y.o. female presenting today for evaluation of epigastric pain/diarrhea for past four days.   She is unaware of any physical activity that may have brought on this pain. She picked up a bucket of water  one week ago. She has used some tylenol  at bedtime about three times per week. States she does drink a mixed drink once a month but has been drug free for four years. She got her license back. She denies N/V. Appetite is ok. BM every couple of days but watery. Linzess  only prn. Has been on ozempic for few months, current dose for about one month. Did not tolerate mounjaro in the past due to delayed gastric emptying symptoms but feels like has done well with ozempic. Taking pantoprazole  BID.    Overdue for labs and liver imagine.     Wt Readings from Last 10 Encounters:  02/09/23 210 lb 12.8 oz (95.6 kg)  06/25/22 195 lb (88.5 kg)  06/05/22 195 lb 5.2 oz (88.6 kg)  05/28/22 195 lb 5.2 oz (88.6 kg)  05/04/22 195 lb 6.4 oz (88.6 kg)  03/12/22 195 lb (88.5 kg)  10/27/21 203 lb 9.6 oz (92.4 kg)  09/05/21 205 lb 9.6 oz (93.3 kg)  07/09/21 198 lb 12.8 oz (90.2 kg)  06/26/21 209 lb 12.8 oz (95.2 kg)    EGD 05/2022: -portal HTN gastropathy -gastritis s/p bx, no h.pylori -repeat EGD in 3 yeras  Colonoscopy 05/2022 -one 17mm polyp in cecum -one 13mm polyp in ascending colon -two 5-7 mm polyps transverse colon -tubular adenomas, repeat colonoscopy 3 years   Medications   Current Outpatient Medications  Medication Sig Dispense Refill   ACCU-CHEK GUIDE test strip USE TO TEST ONCE DAILY.     albuterol   (VENTOLIN  HFA) 108 (90 Base) MCG/ACT inhaler Inhale 2 puffs into the lungs every 6 (six) hours as needed for wheezing or shortness of breath. 1 each 5   BIOTIN PO Take 1,500 mcg by mouth daily.     Blood Glucose Monitoring Suppl (ACCU-CHEK GUIDE) w/Device KIT USE AS DIRECTED.R     Cariprazine HCl 6 MG CAPS Take 6 mg by mouth daily.     Fluticasone -Umeclidin-Vilant (TRELEGY ELLIPTA ) 200-62.5-25 MCG/ACT AEPB Inhale 1 puff into the lungs daily in the afternoon. 60 each 5   lamoTRIgine (LAMICTAL) 100 MG tablet Take 100 mg by mouth daily.     LANTUS  SOLOSTAR 100 UNIT/ML Solostar Pen Inject 28 Units into the skin at bedtime.     LINZESS  145 MCG CAPS capsule TAKE 1 CAPSULE DAILY BEFORE BREAKFAST. 30 capsule 5   losartan (COZAAR) 50 MG tablet Take 50 mg by mouth daily.     Melatonin 10 MG TABS Take 20 mg by mouth at bedtime.     metFORMIN  (GLUCOPHAGE ) 1000 MG tablet Take 1,000 mg by mouth 2 (two) times daily with a meal.     OZEMPIC, 2 MG/DOSE, 8 MG/3ML SOPN      pantoprazole  (PROTONIX ) 40 MG tablet Take 40 mg by mouth 2 (two) times daily.     pravastatin  (PRAVACHOL ) 40 MG tablet  Take 40 mg by mouth at bedtime.     prazosin (MINIPRESS) 1 MG capsule Take 1 mg by mouth at bedtime.     SUBOXONE 8-2 MG FILM Place under the tongue 3 (three) times daily.     SURE COMFORT PEN NEEDLES 31G X 5 MM MISC INJECT AS DIRECTED.O     traZODone  (DESYREL ) 50 MG tablet Take 100 mg by mouth at bedtime.     No current facility-administered medications for this visit.    Allergies   Allergies as of 02/09/2023 - Review Complete 02/09/2023  Allergen Reaction Noted   Aripiprazole  02/09/2023   Bupropion   02/09/2023   Chantix [varenicline] Other (See Comments) 05/28/2017   Ciprofloxacin Swelling 05/20/2005   Gabapentin   02/09/2023   Lisinopril   02/09/2023   Nsaids Hives 05/02/2012   Shrimp [shellfish allergy] Itching 05/19/2017   Tylenol  [acetaminophen ] Other (See Comments) 03/27/2017       Review of Systems    General: Negative for anorexia, weight loss, fever, chills, fatigue, weakness. ENT: Negative for hoarseness, difficulty swallowing , nasal congestion. CV: Negative for chest pain, angina, palpitations, dyspnea on exertion, peripheral edema.  Respiratory: Negative for dyspnea at rest, dyspnea on exertion, cough, sputum, wheezing.  GI: See history of present illness. GU:  Negative for dysuria, hematuria, urinary incontinence, urinary frequency, nocturnal urination.  Endo: Negative for unusual weight change.     Physical Exam   BP 104/72 (BP Location: Right Arm, Patient Position: Sitting, Cuff Size: Normal)   Pulse (!) 101   Temp 98 F (36.7 C) (Oral)   Ht 5' 6 (1.676 m)   Wt 210 lb 12.8 oz (95.6 kg)   LMP 01/04/2017 (Approximate)   SpO2 95%   BMI 34.02 kg/m    General: Well-nourished, well-developed in no acute distress.  Eyes: No icterus. Mouth: Oropharyngeal mucosa moist and pink   Abdomen: Bowel sounds are normal,  nondistended, no hepatosplenomegaly or masses,  no abdominal bruits or hernia , no rebound or guarding. Moderate epigastric tenderness, mild ruq tenderness Rectal: not performed  Extremities: No lower extremity edema. No clubbing or deformities. Neuro: Alert and oriented x 4   Skin: Warm and dry, no jaundice.   Psych: Alert and cooperative, normal mood and affect.  Labs   Lab Results  Component Value Date   NA 140 05/20/2022   CL 105 05/20/2022   K 5.0 05/20/2022   CO2 26 05/20/2022   BUN 16 05/20/2022   CREATININE 1.09 (H) 05/20/2022   GFRNONAA >60 10/13/2019   CALCIUM 9.9 05/20/2022   PHOS 2.5 06/02/2014   ALBUMIN 4.0 10/13/2019   GLUCOSE 75 05/20/2022   Lab Results  Component Value Date   ALT 37 (H) 05/20/2022   AST 37 (H) 05/20/2022   ALKPHOS 88 10/13/2019   BILITOT 0.4 05/20/2022   Lab Results  Component Value Date   WBC 5.8 05/20/2022   HGB 15.3 05/20/2022   HCT 44.7 05/20/2022   MCV 89.2 05/20/2022   PLT 146 05/20/2022   Lab  Results  Component Value Date   INR 1.1 05/20/2022   INR 1.1 11/06/2021   INR 1.1 03/21/2021    Imaging Studies   No results found.  Assessment/Plan:   Cirrhosis: well compensated -due for labs and hepatoma screening -advised no etoh is safe, recommend complete cessation   Epigastric pain: acute onset with some mild diarrhea -rule out pancreatitis, hold ozempic until lab results return -check labs and CT A/P with contrast -continue PPI BID -if symptoms  worsen, go to the ER    [employer]. Ezzard, MHS, PA-C [facility]

## 2023-02-10 ENCOUNTER — Other Ambulatory Visit (HOSPITAL_COMMUNITY)
Admission: RE | Admit: 2023-02-10 | Discharge: 2023-02-10 | Disposition: A | Discharge status: Home / Self Care | Payer: 59 | Source: Ambulatory Visit | Attending: Gastroenterology | Admitting: Gastroenterology

## 2023-02-10 ENCOUNTER — Ambulatory Visit (HOSPITAL_COMMUNITY)
Admission: RE | Admit: 2023-02-10 | Discharge: 2023-02-10 | Disposition: A | Discharge status: Home / Self Care | Payer: 59 | Source: Ambulatory Visit | Attending: Gastroenterology | Admitting: Gastroenterology

## 2023-02-10 DIAGNOSIS — K7460 Unspecified cirrhosis of liver: Secondary | ICD-10-CM | POA: Diagnosis present

## 2023-02-10 DIAGNOSIS — R1013 Epigastric pain: Secondary | ICD-10-CM | POA: Diagnosis present

## 2023-02-10 LAB — COMPREHENSIVE METABOLIC PANEL
ALT: 51 U/L — ABNORMAL HIGH (ref 0–44)
AST: 61 U/L — ABNORMAL HIGH (ref 15–41)
Albumin: 4 g/dL (ref 3.5–5.0)
Alkaline Phosphatase: 112 U/L (ref 38–126)
Anion gap: 9 (ref 5–15)
BUN: 18 mg/dL (ref 6–20)
CO2: 21 mmol/L — ABNORMAL LOW (ref 22–32)
Calcium: 9.7 mg/dL (ref 8.9–10.3)
Chloride: 105 mmol/L (ref 98–111)
Creatinine, Ser: 1 mg/dL (ref 0.44–1.00)
GFR, Estimated: >60 mL/min (ref >60)
Glucose, Bld: 133 mg/dL — ABNORMAL HIGH (ref 70–99)
Potassium: 3.9 mmol/L (ref 3.5–5.1)
Sodium: 135 mmol/L (ref 135–145)
Total Bilirubin: 0.7 mg/dL (ref 0.0–1.2)
Total Protein: 7.8 g/dL (ref 6.5–8.1)

## 2023-02-10 LAB — CBC WITH DIFFERENTIAL/PLATELET
Abs Immature Granulocytes: 0.02 10*3/uL (ref 0.00–0.07)
Basophils Absolute: 0 10*3/uL (ref 0.0–0.1)
Basophils Relative: 1 %
Eosinophils Absolute: 0.1 10*3/uL (ref 0.0–0.5)
Eosinophils Relative: 2 %
HCT: 42.9 % (ref 36.0–46.0)
Hemoglobin: 14.6 g/dL (ref 12.0–15.0)
Immature Granulocytes: 0 %
Lymphocytes Relative: 15 %
Lymphs Abs: 0.9 10*3/uL (ref 0.7–4.0)
MCH: 31.1 pg (ref 26.0–34.0)
MCHC: 34 g/dL (ref 30.0–36.0)
MCV: 91.3 fL (ref 80.0–100.0)
Monocytes Absolute: 0.3 10*3/uL (ref 0.1–1.0)
Monocytes Relative: 5 %
Neutro Abs: 4.6 10*3/uL (ref 1.7–7.7)
Neutrophils Relative %: 77 %
Platelets: 142 10*3/uL — ABNORMAL LOW (ref 150–400)
RBC: 4.7 MIL/uL (ref 3.87–5.11)
RDW: 14.3 % (ref 11.5–15.5)
WBC: 6 10*3/uL (ref 4.0–10.5)
nRBC: 0 % (ref 0.0–0.2)

## 2023-02-10 LAB — PROTIME-INR
INR: 1.1 (ref 0.8–1.2)
Prothrombin Time: 14.7 s (ref 11.4–15.2)

## 2023-02-10 LAB — HEPATITIS B SURFACE ANTIBODY,QUALITATIVE: Hep B S Ab: NONREACTIVE

## 2023-02-10 LAB — POCT I-STAT CREATININE: Creatinine, Ser: 1.1 mg/dL — ABNORMAL HIGH (ref 0.44–1.00)

## 2023-02-10 LAB — LIPASE, BLOOD: Lipase: 61 U/L — ABNORMAL HIGH (ref 11–51)

## 2023-02-10 MED ORDER — IOHEXOL 300 MG/ML  SOLN
100.0000 mL | Freq: Once | INTRAMUSCULAR | Status: AC | PRN
  Administered 2023-02-10: 100 mL via INTRAVENOUS

## 2023-02-11 ENCOUNTER — Other Ambulatory Visit: Payer: Self-pay

## 2023-02-11 DIAGNOSIS — K7460 Unspecified cirrhosis of liver: Secondary | ICD-10-CM

## 2023-02-11 LAB — AFP TUMOR MARKER: AFP, Serum, Tumor Marker: 4.1 ng/mL (ref 0.0–9.2)

## 2023-03-04 ENCOUNTER — Ambulatory Visit (HOSPITAL_COMMUNITY)
Admission: RE | Admit: 2023-03-04 | Discharge: 2023-03-04 | Disposition: A | Discharge status: Home / Self Care | Payer: 59 | Source: Ambulatory Visit | Attending: Family Medicine | Admitting: Family Medicine

## 2023-03-04 ENCOUNTER — Encounter (HOSPITAL_COMMUNITY): Payer: Self-pay

## 2023-03-04 DIAGNOSIS — Z1231 Encounter for screening mammogram for malignant neoplasm of breast: Secondary | ICD-10-CM

## 2023-03-07 ENCOUNTER — Ambulatory Visit (HOSPITAL_COMMUNITY): Admission: RE | Admit: 2023-03-07 | Payer: 59 | Source: Ambulatory Visit

## 2023-04-01 ENCOUNTER — Ambulatory Visit (HOSPITAL_COMMUNITY)
Admission: RE | Admit: 2023-04-01 | Discharge: 2023-04-01 | Disposition: A | Discharge status: Home / Self Care | Payer: 59 | Source: Ambulatory Visit | Attending: Family Medicine | Admitting: Family Medicine

## 2023-04-01 DIAGNOSIS — Z720 Tobacco use: Secondary | ICD-10-CM

## 2023-04-01 DIAGNOSIS — Z122 Encounter for screening for malignant neoplasm of respiratory organs: Secondary | ICD-10-CM | POA: Insufficient documentation

## 2023-04-01 DIAGNOSIS — F17210 Nicotine dependence, cigarettes, uncomplicated: Secondary | ICD-10-CM | POA: Diagnosis not present

## 2023-04-01 DIAGNOSIS — J439 Emphysema, unspecified: Secondary | ICD-10-CM | POA: Insufficient documentation

## 2023-04-01 DIAGNOSIS — I2510 Atherosclerotic heart disease of native coronary artery without angina pectoris: Secondary | ICD-10-CM | POA: Insufficient documentation

## 2023-04-01 DIAGNOSIS — K7460 Unspecified cirrhosis of liver: Secondary | ICD-10-CM | POA: Insufficient documentation

## 2023-04-01 DIAGNOSIS — I700 Atherosclerosis of aorta: Secondary | ICD-10-CM | POA: Insufficient documentation

## 2023-05-04 ENCOUNTER — Other Ambulatory Visit: Payer: Self-pay

## 2023-05-04 ENCOUNTER — Ambulatory Visit (HOSPITAL_COMMUNITY)

## 2023-05-04 DIAGNOSIS — K7460 Unspecified cirrhosis of liver: Secondary | ICD-10-CM

## 2023-05-11 ENCOUNTER — Telehealth: Payer: Self-pay | Admitting: Urology

## 2023-05-11 NOTE — Telephone Encounter (Signed)
US order updated.

## 2023-05-11 NOTE — Telephone Encounter (Signed)
 Needs ultrasound order moved out to July she is trying to schedule and the order expires in April

## 2023-05-11 NOTE — Addendum Note (Signed)
 Addended by: Dorma Gash on: 05/11/2023 11:33 AM   Modules accepted: Orders

## 2023-05-17 ENCOUNTER — Other Ambulatory Visit (HOSPITAL_COMMUNITY)
Admission: RE | Admit: 2023-05-17 | Discharge: 2023-05-17 | Disposition: A | Discharge status: Home / Self Care | Source: Ambulatory Visit | Attending: Gastroenterology | Admitting: Gastroenterology

## 2023-05-17 DIAGNOSIS — K7460 Unspecified cirrhosis of liver: Secondary | ICD-10-CM | POA: Insufficient documentation

## 2023-05-17 DIAGNOSIS — R1013 Epigastric pain: Secondary | ICD-10-CM | POA: Diagnosis present

## 2023-05-17 LAB — COMPREHENSIVE METABOLIC PANEL WITH GFR
ALT: 45 U/L — ABNORMAL HIGH (ref 0–44)
AST: 61 U/L — ABNORMAL HIGH (ref 15–41)
Albumin: 4 g/dL (ref 3.5–5.0)
Alkaline Phosphatase: 110 U/L (ref 38–126)
Anion gap: 9 (ref 5–15)
BUN: 16 mg/dL (ref 6–20)
CO2: 25 mmol/L (ref 22–32)
Calcium: 9.6 mg/dL (ref 8.9–10.3)
Chloride: 103 mmol/L (ref 98–111)
Creatinine, Ser: 0.96 mg/dL (ref 0.44–1.00)
GFR, Estimated: >60 mL/min (ref >60)
Glucose, Bld: 125 mg/dL — ABNORMAL HIGH (ref 70–99)
Potassium: 4.1 mmol/L (ref 3.5–5.1)
Sodium: 137 mmol/L (ref 135–145)
Total Bilirubin: 0.5 mg/dL (ref 0.0–1.2)
Total Protein: 8.1 g/dL (ref 6.5–8.1)

## 2023-05-17 LAB — CBC WITH DIFFERENTIAL/PLATELET
Abs Immature Granulocytes: 0.03 10*3/uL (ref 0.00–0.07)
Basophils Absolute: 0 10*3/uL (ref 0.0–0.1)
Basophils Relative: 1 %
Eosinophils Absolute: 0.1 10*3/uL (ref 0.0–0.5)
Eosinophils Relative: 1 %
HCT: 45.1 % (ref 36.0–46.0)
Hemoglobin: 15.3 g/dL — ABNORMAL HIGH (ref 12.0–15.0)
Immature Granulocytes: 0 %
Lymphocytes Relative: 12 %
Lymphs Abs: 0.8 10*3/uL (ref 0.7–4.0)
MCH: 31.7 pg (ref 26.0–34.0)
MCHC: 33.9 g/dL (ref 30.0–36.0)
MCV: 93.4 fL (ref 80.0–100.0)
Monocytes Absolute: 0.4 10*3/uL (ref 0.1–1.0)
Monocytes Relative: 6 %
Neutro Abs: 5.5 10*3/uL (ref 1.7–7.7)
Neutrophils Relative %: 80 %
Platelets: 139 10*3/uL — ABNORMAL LOW (ref 150–400)
RBC: 4.83 MIL/uL (ref 3.87–5.11)
RDW: 13.2 % (ref 11.5–15.5)
WBC: 6.8 10*3/uL (ref 4.0–10.5)
nRBC: 0 % (ref 0.0–0.2)

## 2023-05-17 LAB — PROTIME-INR
INR: 1.1 (ref 0.8–1.2)
Prothrombin Time: 14.1 s (ref 11.4–15.2)

## 2023-05-18 LAB — MITOCHONDRIAL ANTIBODIES: Mitochondrial M2 Ab, IgG: <20 U (ref 0.0–20.0)

## 2023-05-24 ENCOUNTER — Other Ambulatory Visit: Payer: Self-pay

## 2023-05-24 DIAGNOSIS — R7401 Elevation of levels of liver transaminase levels: Secondary | ICD-10-CM

## 2023-05-24 DIAGNOSIS — R7989 Other specified abnormal findings of blood chemistry: Secondary | ICD-10-CM

## 2023-05-24 DIAGNOSIS — K7460 Unspecified cirrhosis of liver: Secondary | ICD-10-CM

## 2023-06-04 ENCOUNTER — Other Ambulatory Visit: Payer: Self-pay

## 2023-06-04 DIAGNOSIS — R7401 Elevation of levels of liver transaminase levels: Secondary | ICD-10-CM

## 2023-06-04 DIAGNOSIS — R7989 Other specified abnormal findings of blood chemistry: Secondary | ICD-10-CM

## 2023-06-04 DIAGNOSIS — K7460 Unspecified cirrhosis of liver: Secondary | ICD-10-CM

## 2023-06-16 ENCOUNTER — Other Ambulatory Visit (HOSPITAL_COMMUNITY)
Admission: RE | Admit: 2023-06-16 | Discharge: 2023-06-16 | Disposition: A | Discharge status: Home / Self Care | Source: Ambulatory Visit | Attending: Gastroenterology | Admitting: Gastroenterology

## 2023-06-16 DIAGNOSIS — K760 Fatty (change of) liver, not elsewhere classified: Secondary | ICD-10-CM | POA: Insufficient documentation

## 2023-06-16 DIAGNOSIS — R7989 Other specified abnormal findings of blood chemistry: Secondary | ICD-10-CM | POA: Diagnosis present

## 2023-06-16 DIAGNOSIS — K7460 Unspecified cirrhosis of liver: Secondary | ICD-10-CM | POA: Diagnosis not present

## 2023-06-16 DIAGNOSIS — R7401 Elevation of levels of liver transaminase levels: Secondary | ICD-10-CM | POA: Insufficient documentation

## 2023-06-16 LAB — HEPATIC FUNCTION PANEL
ALT: 48 U/L — ABNORMAL HIGH (ref 0–44)
AST: 50 U/L — ABNORMAL HIGH (ref 15–41)
Albumin: 3.9 g/dL (ref 3.5–5.0)
Alkaline Phosphatase: 98 U/L (ref 38–126)
Bilirubin, Direct: 0.1 mg/dL (ref 0.0–0.2)
Indirect Bilirubin: 0.5 mg/dL (ref 0.3–0.9)
Total Bilirubin: 0.6 mg/dL (ref 0.0–1.2)
Total Protein: 7.5 g/dL (ref 6.5–8.1)

## 2023-06-16 LAB — HIV ANTIBODY (ROUTINE TESTING W REFLEX): HIV Screen 4th Generation wRfx: NONREACTIVE

## 2023-06-17 ENCOUNTER — Ambulatory Visit: Payer: Self-pay | Admitting: Gastroenterology

## 2023-06-17 DIAGNOSIS — K7460 Unspecified cirrhosis of liver: Secondary | ICD-10-CM

## 2023-06-17 DIAGNOSIS — R7989 Other specified abnormal findings of blood chemistry: Secondary | ICD-10-CM

## 2023-06-17 LAB — ANTI-SMOOTH MUSCLE ANTIBODY, IGG: F-Actin IgG: 52 U — ABNORMAL HIGH (ref 0–19)

## 2023-06-17 LAB — HEPATITIS B SURFACE ANTIBODY, QUANTITATIVE: Hep B S AB Quant (Post): <3.5 m[IU]/mL — ABNORMAL LOW

## 2023-06-17 LAB — AFP TUMOR MARKER: AFP, Serum, Tumor Marker: 4 ng/mL (ref 0.0–9.2)

## 2023-06-17 LAB — HCV RNA QUANT: HCV Quantitative: NOT DETECTED [IU]/mL (ref >50)

## 2023-07-06 ENCOUNTER — Encounter: Payer: Self-pay | Admitting: Gastroenterology

## 2023-07-13 NOTE — Progress Notes (Signed)
 Noted

## 2023-07-28 ENCOUNTER — Ambulatory Visit (HOSPITAL_COMMUNITY)
Admission: RE | Admit: 2023-07-28 | Discharge: 2023-07-28 | Disposition: A | Discharge status: Home / Self Care | Source: Ambulatory Visit | Attending: Gastroenterology | Admitting: Gastroenterology

## 2023-07-28 ENCOUNTER — Ambulatory Visit (HOSPITAL_COMMUNITY)
Admission: RE | Admit: 2023-07-28 | Discharge: 2023-07-28 | Disposition: A | Discharge status: Home / Self Care | Source: Ambulatory Visit | Attending: Urology | Admitting: Urology

## 2023-07-28 DIAGNOSIS — R7989 Other specified abnormal findings of blood chemistry: Secondary | ICD-10-CM | POA: Insufficient documentation

## 2023-07-28 DIAGNOSIS — N281 Cyst of kidney, acquired: Secondary | ICD-10-CM | POA: Insufficient documentation

## 2023-07-28 DIAGNOSIS — K7460 Unspecified cirrhosis of liver: Secondary | ICD-10-CM | POA: Insufficient documentation

## 2023-08-03 ENCOUNTER — Ambulatory Visit: Payer: Self-pay | Admitting: Urology

## 2023-08-05 ENCOUNTER — Other Ambulatory Visit (HOSPITAL_COMMUNITY)

## 2023-08-11 ENCOUNTER — Ambulatory Visit (INDEPENDENT_AMBULATORY_CARE_PROVIDER_SITE_OTHER): Admitting: Urology

## 2023-08-11 ENCOUNTER — Encounter: Payer: Self-pay | Admitting: Urology

## 2023-08-11 VITALS — BP 127/76 | HR 108

## 2023-08-11 DIAGNOSIS — N281 Cyst of kidney, acquired: Secondary | ICD-10-CM

## 2023-08-11 LAB — URINALYSIS, ROUTINE W REFLEX MICROSCOPIC
Bilirubin, UA: NEGATIVE
Glucose, UA: NEGATIVE
Ketones, UA: NEGATIVE
Leukocytes,UA: NEGATIVE
Nitrite, UA: NEGATIVE
Protein,UA: NEGATIVE
RBC, UA: NEGATIVE
Specific Gravity, UA: 1.01 (ref 1.005–1.030)
Urobilinogen, Ur: 0.2 mg/dL (ref 0.2–1.0)
pH, UA: 6 (ref 5.0–7.5)

## 2023-08-11 NOTE — Patient Instructions (Signed)
Pyelonephritis, Adult Pyelonephritis is an infection that occurs in the kidney. The kidneys are the organs that filter the blood and move waste from the bloodstream to the urine. Urine passes out of the kidneys through tubes called ureters and goes into the bladder. There are two main types of this condition: Acute pyelonephritis. These are infections that come on quickly and without any warning. Chronic pyelonephritis. These infections last for a long time. In most cases, the infection clears up with treatment. In more severe cases, an infection can spread to the bloodstream or lead to other problems with the kidneys. What are the causes? This condition is often caused by bacteria. The bacteria may travel: From the bladder up to the kidney. This may happen after you have a bladder infection (cystitis) or urinary tract infection (UTI). From the bloodstream to the kidney. What increases the risk? You are more likely to develop this condition if: You are female. Your risk is even higher if you are pregnant. You are older. You have: Diabetes. Prostatitis. This is inflammation of the prostate gland. Kidney stones or bladder stones. Problems with your kidneys or ureters. Cancer. Spinal cord injury or nerve damage around the bladder. You have a soft tube (catheter) placed in your bladder. You are sexually active and use spermicides. You have or have had a UTI. What are the signs or symptoms? Symptoms of this condition include: An urge to urinate that is strong or does not go away. You may also urinate more often than normal. A burning or stinging feeling when you urinate. Pain. This may be in your abdomen, back, side, or groin. Fever or chills. Nausea or vomiting. Urine that is bloody, dark, cloudy, or smells bad. How is this diagnosed? This condition may be diagnosed based on your medical history and a physical exam. You may also have tests, such as: Urine tests. Blood tests. Imaging  tests of the kidneys. These may include an ultrasound or CT scan. How is this treated? Treatment for this condition depends on the severity of the infection. If the infection is mild and found early, you may be given antibiotics to take by mouth. You will need to drink lots of fluids. If the infection is more severe, you may need to stay in the hospital and receive antibiotics through an IV. You may also get fluids through an IV. After you leave the hospital, you may need to take antibiotics by mouth. Other treatments may be needed. These will depend on the cause of the infection. Follow these instructions at home: Eating and drinking Drink enough fluid to keep your urine pale yellow. Avoid caffeine, tea, and carbonated drinks. These can irritate the bladder. General instructions Take over-the-counter and prescription medicines as told by your health care provider. Finish your antibiotics even if you start to feel better. Urinate often. Avoid holding in urine for long periods of time. Urinate before and after sex. If you are female, cleanse from front to back after a bowel movement. Use each tissue only once. Keep all follow-up visits. Your health care provider will want to make sure your infection is gone. Contact a health care provider if: Your symptoms do not get better after 2 days of treatment. Your symptoms get worse. You have a fever or chills. You cannot take your antibiotics. Get help right away if: You vomit each time that you eat or drink. You have severe pain in your back or side. You are very weak, or you faint. This information is  not intended to replace advice given to you by your health care provider. Make sure you discuss any questions you have with your health care provider. Document Revised: 08/04/2021 Document Reviewed: 08/04/2021 Elsevier Patient Education  2024 [employer].

## 2023-08-11 NOTE — Progress Notes (Signed)
 08/11/2023 2:01 PM   Jamie Bell 10-21-1970 [id number]  Referring provider: Halbert Mariano SQUIBB, DO [address] [place],  [place] [address]  Followup left renal abscess  HPI: Jamie Bell is a 53yo here for followup for a left renal abscess. Renal US  07/28/23 shows no evidence of renal cyst. She was treated for a UTI 3 weeks ago. UA today is normal.    PMH: Past Medical History:  Diagnosis Date   Abscess    NECK ANTERIOR   Arthritis    Chronic back pain    Cirrhosis (HCC)    Constipation    DDD (degenerative disc disease)    Decreased hearing on the right.   F/u by ENT in the past and no further management.   Depression    Diabetes mellitus    Dyspnea    with exertion   GERD (gastroesophageal reflux disease)    High cholesterol    Hypertension    took med. for HTN, several yrs. ago, no longer needs , seen by Dr. Juventino last yr. during a hosp. at [facility], for MRSA bacteremia    MRSA bacteremia 2014   Tx with Vancomycin    NASH (nonalcoholic steatohepatitis)    PCOS (polycystic ovarian syndrome)    Pneumonia 2016   Polysubstance abuse (HCC)    Sciatica    HNP- Cerv. 7- T2    Surgical History: Past Surgical History:  Procedure Laterality Date   ANTERIOR CERVICAL DECOMP/DISCECTOMY FUSION N/A 05/28/2017   Procedure: ANTERIOR CERVICAL DECOMPRESSION CERVICAL THREE-FOUR;  Surgeon: Gillie Duncans, MD;  [facility];  Service: Neurosurgery;  Laterality: N/A;  anterior   BACK SURGERY     BIOPSY  06/05/2022   Procedure: BIOPSY;  Surgeon: Cindie Carlin POUR, DO;  [facility];  Service: Endoscopy;;   BREAST EXCISIONAL BIOPSY Left 2007   benign/clogged milk ducts   CARPAL TUNNEL RELEASE     CATARACT EXTRACTION W/PHACO Left 02/04/2015   Procedure: CATARACT EXTRACTION PHACO AND INTRAOCULAR LENS PLACEMENT (IOC);  Surgeon: Cherene Mania, MD;  Location: [facility];  Service: Ophthalmology;  Laterality: Left;  CDE: 4.11   CATARACT EXTRACTION W/PHACO Right 05/24/2020    Procedure: CATARACT EXTRACTION PHACO AND INTRAOCULAR LENS PLACEMENT RIGHT EYE;  Surgeon: Harrie Agent, MD;  Location: [facility];  Service: Ophthalmology;  Laterality: Right;  right CDE=28.55   CHOLECYSTECTOMY     COLONOSCOPY WITH PROPOFOL  N/A 11/12/2015   Dr. fields:Small internal hemorrhoids, 2 polyps removed (tubular adenomas) with noted congestion and edema in the left colon most consistent with NSAID use.  Next colonoscopy in 5 to 10 years.   COLONOSCOPY WITH PROPOFOL  N/A 06/05/2022   Procedure: COLONOSCOPY WITH PROPOFOL ;  Surgeon: Cindie Carlin POUR, DO;  [facility];  Service: Endoscopy;  Laterality: N/A;  215pm, asa 3   ESOPHAGEAL BANDING N/A 11/12/2015   Procedure: ESOPHAGEAL BANDING;  Surgeon: Margo LITTIE Haddock, MD;  [facility];  Service: Endoscopy;  Laterality: N/A;   ESOPHAGOGASTRODUODENOSCOPY  2007   Dr. Haddock: mild antral erythema. Future procedures need to be done with Propofol  per notes.    ESOPHAGOGASTRODUODENOSCOPY (EGD) WITH PROPOFOL  N/A 11/12/2015   Dr. haddock: Normal esophagus with no varices.  Gastritis and duodenitis, biopsy without H. pylori, most consistent with NSAID/aspirin  use.   ESOPHAGOGASTRODUODENOSCOPY (EGD) WITH PROPOFOL  N/A 10/17/2019   Carver: no esophageal varices. biopsies from esophagus c/w reflux esophagitis. she had portal hypertensive gastropathy. gastric bx negative for H.pylori and showed nonspecific reactive gastropathy. next EGD in  3 years.    ESOPHAGOGASTRODUODENOSCOPY (EGD) WITH PROPOFOL  N/A 06/05/2022   Procedure: ESOPHAGOGASTRODUODENOSCOPY (EGD) WITH PROPOFOL ;  Surgeon: Cindie Carlin POUR, DO;  [facility];  Service: Endoscopy;  Laterality: N/A;   INCISION AND DRAINAGE WOUND WITH TENDON REPAIR Right 07/03/2016   Procedure: INCISION AND DRAINAGE RIGHT LONG PROXIMAL INTERPHALANGEAL JOINT WOUND WITH  TENDON REPAIR AND CULTURES;  Surgeon: Sissy Cough, MD;  Location: [facility];  Service: Orthopedics;  Laterality: Right;    IR RADIOLOGIST EVAL & MGMT  09/24/2020   NECK SURGERY     PERIPHERALLY INSERTED CENTRAL CATHETER INSERTION Right 2007   for treatment with Vancomycin     POLYPECTOMY  11/12/2015   Procedure: POLYPECTOMY;  Surgeon: Margo LITTIE Haddock, MD;  [facility];  Service: Endoscopy;;  transverse colon polyp   POLYPECTOMY  06/05/2022   Procedure: POLYPECTOMY;  Surgeon: Cindie Carlin POUR, DO;  [facility];  Service: Endoscopy;;   POSTERIOR CERVICAL LAMINECTOMY N/A 11/22/2013   Procedure: POSTERIOR CERVICAL LAMINECTOMY C7/T1, T/1-2;  Surgeon: Arley SHAUNNA Helling, MD;  [facility];  Service: Neurosurgery;  Laterality: N/A;  POSTERIOR CERVICAL LAMINECTOMY C7/T1, T/1-2   TEE WITHOUT CARDIOVERSION N/A 08/09/2012   Procedure: TRANSESOPHAGEAL ECHOCARDIOGRAM (TEE);  Surgeon: Lamar CHRISTELLA Rom, MD;  [facility];  Service: Cardiovascular;  Laterality: N/A;    Home Medications:  Allergies as of 08/11/2023       Reactions   Aripiprazole    Other Reaction(s): hyperglycemia   Bupropion     Other Reaction(s): ill temper   Chantix [varenicline] Other (See Comments)   Vivid nightmares   Ciprofloxacin Swelling   Gabapentin     Other Reaction(s): jerks   Lisinopril     Other Reaction(s): tiredness   Nsaids Hives   Pt takes ibuprofen  with no reaction   Shrimp [shellfish Allergy] Itching   Makes hands itch    Tylenol  [acetaminophen ] Other (See Comments)   Patient has cirrhosis of the liver and this causes elevated levels        Medication List        Accurate as of August 11, 2023  2:01 PM. If you have any questions, ask your nurse or doctor.          Accu-Chek Guide test strip Generic drug: glucose blood USE TO TEST ONCE DAILY.   Accu-Chek Guide w/Device Kit USE AS DIRECTED.R   albuterol  108 (90 Base) MCG/ACT inhaler Commonly known as: Ventolin  HFA Inhale 2 puffs into the lungs every 6 (six) hours as needed for wheezing or shortness of breath.   BIOTIN PO Take  1,500 mcg by mouth daily.   Cariprazine HCl 6 MG Caps Take 6 mg by mouth daily.   lamoTRIgine 100 MG tablet Commonly known as: LAMICTAL Take 100 mg by mouth daily.   Lantus  SoloStar 100 UNIT/ML Solostar Pen Generic drug: insulin  glargine Inject 28 Units into the skin at bedtime.   Linzess  145 MCG Caps capsule Generic drug: linaclotide  TAKE 1 CAPSULE DAILY BEFORE BREAKFAST.   losartan 50 MG tablet Commonly known as: COZAAR Take 50 mg by mouth daily.   Melatonin 10 MG Tabs Take 20 mg by mouth at bedtime.   metFORMIN  1000 MG tablet Commonly known as: GLUCOPHAGE  Take 1,000 mg by mouth 2 (two) times daily with a meal.   Ozempic (2 MG/DOSE) 8 MG/3ML Sopn Generic drug: Semaglutide (2 MG/DOSE)   pantoprazole  40 MG tablet Commonly known as: PROTONIX  Take 40 mg by mouth 2 (two) times daily.  pravastatin  40 MG tablet Commonly known as: PRAVACHOL  Take 40 mg by mouth at bedtime.   prazosin 1 MG capsule Commonly known as: MINIPRESS Take 1 mg by mouth at bedtime.   Suboxone 8-2 MG Film Generic drug: Buprenorphine HCl-Naloxone HCl Place under the tongue 3 (three) times daily.   Sure Comfort Pen Needles 31G X 5 MM Misc Generic drug: Insulin  Pen Needle INJECT AS DIRECTED.O   traZODone  50 MG tablet Commonly known as: DESYREL  Take 100 mg by mouth at bedtime.   Trelegy Ellipta  200-62.5-25 MCG/ACT Aepb Generic drug: Fluticasone -Umeclidin-Vilant Inhale 1 puff into the lungs daily in the afternoon.        Allergies:  Allergies  Allergen Reactions   Aripiprazole     Other Reaction(s): hyperglycemia   Bupropion      Other Reaction(s): ill temper   Chantix [Varenicline] Other (See Comments)    Vivid nightmares   Ciprofloxacin Swelling   Gabapentin      Other Reaction(s): jerks   Lisinopril      Other Reaction(s): tiredness   Nsaids Hives    Pt takes ibuprofen  with no reaction   Shrimp [Shellfish Allergy] Itching    Makes hands itch    Tylenol  [Acetaminophen ] Other  (See Comments)    Patient has cirrhosis of the liver and this causes elevated levels    Family History: Family History  Problem Relation Age of Onset   Hypertension Mother    Hypertension Father    Cirrhosis Father        Drank some alcohol    Diabetes Brother    Diabetes Other    Cirrhosis Paternal Aunt        No alcohol    Cirrhosis Paternal Aunt        No alcohol    Colon cancer Neg Hx     Social History:  reports that she has been smoking e-cigarettes and cigarettes. She has never used smokeless tobacco. She reports that she does not currently use alcohol . She reports current drug use. Drug: Marijuana.  ROS: All other review of systems were reviewed and are negative except what is noted above in HPI  Physical Exam: BP 127/76   Pulse (!) 108   LMP 01/04/2017 (Approximate)   Constitutional:  Alert and oriented, No acute distress. HEENT: [place] AT, moist mucus membranes.  Trachea midline, no masses. Cardiovascular: No clubbing, cyanosis, or edema. Respiratory: Normal respiratory effort, no increased work of breathing. GI: Abdomen is soft, nontender, nondistended, no abdominal masses GU: No CVA tenderness.  Lymph: No cervical or inguinal lymphadenopathy. Skin: No rashes, bruises or suspicious lesions. Neurologic: Grossly intact, no focal deficits, moving all 4 extremities. Psychiatric: Normal mood and affect.  Laboratory Data: Lab Results  Component Value Date   WBC 6.8 05/17/2023   HGB 15.3 (H) 05/17/2023   HCT 45.1 05/17/2023   MCV 93.4 05/17/2023   PLT 139 (L) 05/17/2023    Lab Results  Component Value Date   CREATININE 0.96 05/17/2023    No results found for: PSA  No results found for: TESTOSTERONE  Lab Results  Component Value Date   HGBA1C 7.6 (H) 08/13/2017    Urinalysis    Component Value Date/Time   COLORURINE YELLOW 05/19/2017 1810   APPEARANCEUR Clear 05/20/2022 1420   LABSPEC 1.017 05/19/2017 1810   PHURINE 6.0 05/19/2017 1810   GLUCOSEU  Negative 05/20/2022 1420   HGBUR LARGE (A) 05/19/2017 1810   BILIRUBINUR Negative 05/20/2022 1420   KETONESUR negative 10/30/2019 1936   KETONESUR NEGATIVE 05/19/2017 1810  PROTEINUR Negative 05/20/2022 1420   PROTEINUR NEGATIVE 05/19/2017 1810   UROBILINOGEN 0.2 10/30/2019 1936   UROBILINOGEN 1.0 05/31/2014 0949   NITRITE Negative 05/20/2022 1420   NITRITE NEGATIVE 05/19/2017 1810   LEUKOCYTESUR Negative 05/20/2022 1420    Lab Results  Component Value Date   LABMICR Comment 05/20/2022   WBCUA None seen 11/13/2020   LABEPIT 0-10 11/13/2020   MUCUS Present 07/19/2020   BACTERIA Few (A) 11/13/2020    Pertinent Imaging: Renal US  07/28/2023: Images reviewed and discussed with the patient  Results for orders placed during the hospital encounter of 09/29/15  DG Abd 1 View  Narrative CLINICAL DATA:  Ileus, lower abdominal pain 6 days  EXAM: ABDOMEN - 1 VIEW  COMPARISON:  10/26/2015 CT  FINDINGS: Gas within mildly prominent colon. Moderate stool. No obstruction or free air. No organomegaly or suspicious calcification. Rightward scoliosis in the lumbar spine with degenerative changes.  IMPRESSION: Moderate stool burden. Mild gaseous distention of the colon without evidence of obstruction.   Electronically Signed By: Franky Crease M.D. On: 10/01/2015 15:19  No results found for this or any previous visit.  No results found for this or any previous visit.  No results found for this or any previous visit.  Results for orders placed during the hospital encounter of 07/28/23  Ultrasound renal complete  Narrative CLINICAL DATA:  renal cyst and abscess  EXAM: RENAL / URINARY TRACT ULTRASOUND COMPLETE  COMPARISON:  February 10, 2023  FINDINGS: Right Kidney:  Renal measurements: 11.2 x 5.2 x 6.1 cm = volume: 185 mL. Echogenicity within normal limits. No mass or hydronephrosis visualized. Previously described renal cyst is not visualized on today's exam.  Left  Kidney:  Renal measurements: 11.5 x 6.1 x 5.6 cm = volume: 204 mL. Echogenicity within normal limits. No mass or hydronephrosis visualized.  Bladder:  Completely decompressed, limiting evaluation.  Other:  Diffusely increased hepatic echogenicity with nodular liver contour.  IMPRESSION: 1. No hydronephrosis. 2. Previously described right renal cyst is not visualized on today's exam.   Electronically Signed By: Corean Salter M.D. On: 07/28/2023 12:08  No results found for this or any previous visit.  No results found for this or any previous visit.  No results found for this or any previous visit.   Assessment & Plan:    1. Acquired cyst of kidney (Primary) -resolved. [place] PRN - Urinalysis, Routine w reflex microscopic   No follow-ups on file.  Belvie Clara, MD  [facility] [place]

## 2023-08-25 ENCOUNTER — Encounter: Payer: Self-pay | Admitting: Cardiology

## 2023-08-25 ENCOUNTER — Ambulatory Visit: Attending: Cardiology | Admitting: Cardiology

## 2023-08-25 VITALS — BP 114/72 | HR 83 | Ht 66.0 in | Wt 211.8 lb

## 2023-08-25 DIAGNOSIS — E782 Mixed hyperlipidemia: Secondary | ICD-10-CM

## 2023-08-25 DIAGNOSIS — I2510 Atherosclerotic heart disease of native coronary artery without angina pectoris: Secondary | ICD-10-CM | POA: Diagnosis not present

## 2023-08-25 MED ORDER — ASPIRIN 81 MG PO TBEC: 81.0000 mg | DELAYED_RELEASE_TABLET | Freq: Every day | ORAL | Status: AC

## 2023-08-25 NOTE — Patient Instructions (Addendum)

## 2023-08-25 NOTE — Progress Notes (Signed)
 Clinical Summary Jamie Bell is a 53 y.o.female seen today as a new consult, referred for the following medical problems.    1.Coronary atherosclerosis - 03/2023 lung cancer screen CT: age advanced CAD (LAD disease), aortic atherosclerosis. - no chest pains, no SOB/DOE - does raking for 30 minutes without symptoms, vacuuming, sweeping. Can have DOE walking upstairs.  - started daily ASA, already on pravastatin   -CAD risk factors: DM, HLD, tobacco, HTN Past Medical History:  Diagnosis Date   Abscess    NECK ANTERIOR   Arthritis    Chronic back pain    Cirrhosis (HCC)    Constipation    DDD (degenerative disc disease)    Decreased hearing on the right.   F/u by ENT in the past and no further management.   Depression    Diabetes mellitus    Dyspnea    with exertion   GERD (gastroesophageal reflux disease)    High cholesterol    Hypertension    took med. for HTN, several yrs. ago, no longer needs , seen by Dr. Juventino last yr. during a hosp. at [facility], for MRSA bacteremia    MRSA bacteremia 2014   Tx with Vancomycin    NASH (nonalcoholic steatohepatitis)    PCOS (polycystic ovarian syndrome)    Pneumonia 2016   Polysubstance abuse (HCC)    Sciatica    HNP- Cerv. 7- T2     Allergies  Allergen Reactions   Aripiprazole     Other Reaction(s): hyperglycemia   Bupropion      Other Reaction(s): ill temper   Chantix [Varenicline] Other (See Comments)    Vivid nightmares   Ciprofloxacin Swelling   Gabapentin      Other Reaction(s): jerks   Lisinopril      Other Reaction(s): tiredness   Nsaids Hives    Pt takes ibuprofen  with no reaction   Shrimp [Shellfish Allergy] Itching    Makes hands itch    Tylenol  [Acetaminophen ] Other (See Comments)    Patient has cirrhosis of the liver and this causes elevated levels     Current Outpatient Medications  Medication Sig Dispense Refill   ACCU-CHEK GUIDE test strip USE TO TEST ONCE DAILY.     albuterol  (VENTOLIN  HFA) 108  (90 Base) MCG/ACT inhaler Inhale 2 puffs into the lungs every 6 (six) hours as needed for wheezing or shortness of breath. 1 each 5   BIOTIN PO Take 1,500 mcg by mouth daily.     Blood Glucose Monitoring Suppl (ACCU-CHEK GUIDE) w/Device KIT USE AS DIRECTED.R     Cariprazine HCl 6 MG CAPS Take 6 mg by mouth daily.     Fluticasone -Umeclidin-Vilant (TRELEGY ELLIPTA ) 200-62.5-25 MCG/ACT AEPB Inhale 1 puff into the lungs daily in the afternoon. 60 each 5   lamoTRIgine (LAMICTAL) 100 MG tablet Take 100 mg by mouth daily.     LANTUS  SOLOSTAR 100 UNIT/ML Solostar Pen Inject 28 Units into the skin at bedtime.     LINZESS  145 MCG CAPS capsule TAKE 1 CAPSULE DAILY BEFORE BREAKFAST. 30 capsule 5   losartan (COZAAR) 50 MG tablet Take 50 mg by mouth daily.     Melatonin 10 MG TABS Take 20 mg by mouth at bedtime.     metFORMIN  (GLUCOPHAGE ) 1000 MG tablet Take 1,000 mg by mouth 2 (two) times daily with a meal.     OZEMPIC, 2 MG/DOSE, 8 MG/3ML SOPN      pantoprazole  (PROTONIX ) 40 MG tablet Take 40 mg by mouth 2 (two) times  daily.     pravastatin  (PRAVACHOL ) 40 MG tablet Take 40 mg by mouth at bedtime.     prazosin (MINIPRESS) 1 MG capsule Take 1 mg by mouth at bedtime.     SUBOXONE 8-2 MG FILM Place under the tongue 3 (three) times daily.     SURE COMFORT PEN NEEDLES 31G X 5 MM MISC INJECT AS DIRECTED.O     traZODone  (DESYREL ) 50 MG tablet Take 100 mg by mouth at bedtime.     No current facility-administered medications for this visit.     Past Surgical History:  Procedure Laterality Date   ANTERIOR CERVICAL DECOMP/DISCECTOMY FUSION N/A 05/28/2017   Procedure: ANTERIOR CERVICAL DECOMPRESSION CERVICAL THREE-FOUR;  Surgeon: Gillie Duncans, MD;  Location: [facility];  Service: Neurosurgery;  Laterality: N/A;  anterior   BACK SURGERY     BIOPSY  06/05/2022   Procedure: BIOPSY;  Surgeon: Cindie Carlin POUR, DO;  Location: [facility];  Service: Endoscopy;;   BREAST EXCISIONAL BIOPSY Left 2007   benign/clogged  milk ducts   CARPAL TUNNEL RELEASE     CATARACT EXTRACTION W/PHACO Left 02/04/2015   Procedure: CATARACT EXTRACTION PHACO AND INTRAOCULAR LENS PLACEMENT (IOC);  Surgeon: Cherene Mania, MD;  Location: [facility];  Service: Ophthalmology;  Laterality: Left;  CDE: 4.11   CATARACT EXTRACTION W/PHACO Right 05/24/2020   Procedure: CATARACT EXTRACTION PHACO AND INTRAOCULAR LENS PLACEMENT RIGHT EYE;  Surgeon: Harrie Agent, MD;  Location: [facility];  Service: Ophthalmology;  Laterality: Right;  right CDE=28.55   CHOLECYSTECTOMY     COLONOSCOPY WITH PROPOFOL  N/A 11/12/2015   Dr. fields:Small internal hemorrhoids, 2 polyps removed (tubular adenomas) with noted congestion and edema in the left colon most consistent with NSAID use.  Next colonoscopy in 5 to 10 years.   COLONOSCOPY WITH PROPOFOL  N/A 06/05/2022   Procedure: COLONOSCOPY WITH PROPOFOL ;  Surgeon: Cindie Carlin POUR, DO;  [facility];  Service: Endoscopy;  Laterality: N/A;  215pm, asa 3   ESOPHAGEAL BANDING N/A 11/12/2015   Procedure: ESOPHAGEAL BANDING;  Surgeon: Margo LITTIE Haddock, MD;  [facility];  Service: Endoscopy;  Laterality: N/A;   ESOPHAGOGASTRODUODENOSCOPY  2007   Dr. Haddock: mild antral erythema. Future procedures need to be done with Propofol  per notes.    ESOPHAGOGASTRODUODENOSCOPY (EGD) WITH PROPOFOL  N/A 11/12/2015   Dr. haddock: Normal esophagus with no varices.  Gastritis and duodenitis, biopsy without H. pylori, most consistent with NSAID/aspirin  use.   ESOPHAGOGASTRODUODENOSCOPY (EGD) WITH PROPOFOL  N/A 10/17/2019   Carver: no esophageal varices. biopsies from esophagus c/w reflux esophagitis. she had portal hypertensive gastropathy. gastric bx negative for H.pylori and showed nonspecific reactive gastropathy. next EGD in 3 years.    ESOPHAGOGASTRODUODENOSCOPY (EGD) WITH PROPOFOL  N/A 06/05/2022   Procedure: ESOPHAGOGASTRODUODENOSCOPY (EGD) WITH PROPOFOL ;  Surgeon: Cindie Carlin POUR, DO;  [facility];   Service: Endoscopy;  Laterality: N/A;   INCISION AND DRAINAGE WOUND WITH TENDON REPAIR Right 07/03/2016   Procedure: INCISION AND DRAINAGE RIGHT LONG PROXIMAL INTERPHALANGEAL JOINT WOUND WITH  TENDON REPAIR AND CULTURES;  Surgeon: Sissy Cough, MD;  Location: [facility];  Service: Orthopedics;  Laterality: Right;   IR RADIOLOGIST EVAL & MGMT  09/24/2020   NECK SURGERY     PERIPHERALLY INSERTED CENTRAL CATHETER INSERTION Right 2007   for treatment with Vancomycin     POLYPECTOMY  11/12/2015   Procedure: POLYPECTOMY;  Surgeon: Margo LITTIE Haddock, MD;  [facility];  Service: Endoscopy;;  transverse colon polyp   POLYPECTOMY  06/05/2022  Procedure: POLYPECTOMY;  Surgeon: Cindie Carlin POUR, DO;  [facility];  Service: Endoscopy;;   POSTERIOR CERVICAL LAMINECTOMY N/A 11/22/2013   Procedure: POSTERIOR CERVICAL LAMINECTOMY C7/T1, T/1-2;  Surgeon: Arley SHAUNNA Helling, MD;  Location: [facility];  Service: Neurosurgery;  Laterality: N/A;  POSTERIOR CERVICAL LAMINECTOMY C7/T1, T/1-2   TEE WITHOUT CARDIOVERSION N/A 08/09/2012   Procedure: TRANSESOPHAGEAL ECHOCARDIOGRAM (TEE);  Surgeon: Lamar CHRISTELLA Rom, MD;  [facility];  Service: Cardiovascular;  Laterality: N/A;     Allergies  Allergen Reactions   Aripiprazole     Other Reaction(s): hyperglycemia   Bupropion      Other Reaction(s): ill temper   Chantix [Varenicline] Other (See Comments)    Vivid nightmares   Ciprofloxacin Swelling   Gabapentin      Other Reaction(s): jerks   Lisinopril      Other Reaction(s): tiredness   Nsaids Hives    Pt takes ibuprofen  with no reaction   Shrimp [Shellfish Allergy] Itching    Makes hands itch    Tylenol  [Acetaminophen ] Other (See Comments)    Patient has cirrhosis of the liver and this causes elevated levels      Family History  Problem Relation Age of Onset   Hypertension Mother    Hypertension Father    Cirrhosis Father        Drank some alcohol    Diabetes Brother     Diabetes Other    Cirrhosis Paternal Aunt        No alcohol    Cirrhosis Paternal Aunt        No alcohol    Colon cancer Neg Hx      Social History Jamie Bell reports that she has been smoking e-cigarettes and cigarettes. She has never used smokeless tobacco. Jamie Bell reports that she does not currently use alcohol .    Physical Examination Today's Vitals   08/25/23 1503  BP: 114/72  Pulse: 83  SpO2: 96%  Weight: 211 lb 12.8 oz (96.1 kg)  Height: 5' 6 (1.676 m)   Body mass index is 34.19 kg/m.  Gen: resting comfortably, no acute distress HEENT: no scleral icterus, pupils equal round and reactive, no palptable cervical adenopathy,  CV: RRR, no mrg, no jvd Resp: Clear to auscultation bilaterally GI: abdomen is soft, non-tender, non-distended, normal bowel sounds, no hepatosplenomegaly MSK: extremities are warm, no edema.  Skin: warm, no rash Neuro:  no focal deficits Psych: appropriate affect      Assessment and Plan  1.Coronary atherosclerosis - noted on recent CT scan for lung cancer screening - denies any specific cardiac symptoms - in absence of symptoms would focus on aggressive risk factor modification, no indication for ischemic testing at this time.  - continue daily ASA, statin. Request labs from pcp - EKG today NSR, no ischemic changes  2. HLD - request pcp labs - continue current statin  F/u 1 year   Dorn PHEBE Ross, M.D.

## 2023-08-27 ENCOUNTER — Encounter: Payer: Self-pay | Admitting: *Deleted

## 2023-09-02 ENCOUNTER — Other Ambulatory Visit (HOSPITAL_COMMUNITY)
Admission: RE | Admit: 2023-09-02 | Discharge: 2023-09-02 | Disposition: A | Discharge status: Home / Self Care | Source: Ambulatory Visit | Attending: Gastroenterology | Admitting: Gastroenterology

## 2023-09-02 DIAGNOSIS — K7460 Unspecified cirrhosis of liver: Secondary | ICD-10-CM | POA: Diagnosis not present

## 2023-09-02 DIAGNOSIS — R7989 Other specified abnormal findings of blood chemistry: Secondary | ICD-10-CM | POA: Insufficient documentation

## 2023-09-02 LAB — COMPREHENSIVE METABOLIC PANEL WITH GFR
ALT: 56 U/L — ABNORMAL HIGH (ref 0–44)
AST: 66 U/L — ABNORMAL HIGH (ref 15–41)
Albumin: 3.8 g/dL (ref 3.5–5.0)
Alkaline Phosphatase: 90 U/L (ref 38–126)
Anion gap: 13 (ref 5–15)
BUN: 13 mg/dL (ref 6–20)
CO2: 22 mmol/L (ref 22–32)
Calcium: 9.3 mg/dL (ref 8.9–10.3)
Chloride: 102 mmol/L (ref 98–111)
Creatinine, Ser: 1.22 mg/dL — ABNORMAL HIGH (ref 0.44–1.00)
GFR, Estimated: 53 mL/min — ABNORMAL LOW (ref >60)
Glucose, Bld: 205 mg/dL — ABNORMAL HIGH (ref 70–99)
Potassium: 4.2 mmol/L (ref 3.5–5.1)
Sodium: 137 mmol/L (ref 135–145)
Total Bilirubin: 0.7 mg/dL (ref 0.0–1.2)
Total Protein: 7.5 g/dL (ref 6.5–8.1)

## 2023-09-02 LAB — CBC WITH DIFFERENTIAL/PLATELET
Abs Granulocyte: 3.3 K/uL (ref 1.5–6.5)
Abs Immature Granulocytes: 0.02 K/uL (ref 0.00–0.07)
Basophils Absolute: 0 K/uL (ref 0.0–0.1)
Basophils Relative: 1 %
Eosinophils Absolute: 0.1 K/uL (ref 0.0–0.5)
Eosinophils Relative: 1 %
HCT: 36.2 % (ref 36.0–46.0)
Hemoglobin: 12.3 g/dL (ref 12.0–15.0)
Immature Granulocytes: 1 %
Lymphocytes Relative: 14 %
Lymphs Abs: 0.6 K/uL — ABNORMAL LOW (ref 0.7–4.0)
MCH: 32.1 pg (ref 26.0–34.0)
MCHC: 34 g/dL (ref 30.0–36.0)
MCV: 94.5 fL (ref 80.0–100.0)
Monocytes Absolute: 0.3 K/uL (ref 0.1–1.0)
Monocytes Relative: 6 %
Neutro Abs: 3.3 K/uL (ref 1.7–7.7)
Neutrophils Relative %: 77 %
Platelets: 111 K/uL — ABNORMAL LOW (ref 150–400)
RBC: 3.83 MIL/uL — ABNORMAL LOW (ref 3.87–5.11)
RDW: 13.2 % (ref 11.5–15.5)
WBC: 4.2 K/uL (ref 4.0–10.5)
nRBC: 0 % (ref 0.0–0.2)

## 2023-09-02 LAB — PROTIME-INR
INR: 1.1 (ref 0.8–1.2)
Prothrombin Time: 14.9 s (ref 11.4–15.2)

## 2023-09-03 LAB — MISC LABCORP TEST (SEND OUT): Labcorp test code: 396053

## 2023-09-03 LAB — ALPHA-1-ANTITRYPSIN: A-1 Antitrypsin, Ser: 148 mg/dL (ref 101–187)

## 2023-09-03 LAB — IGG, IGA, IGM
IgA: 292 mg/dL (ref 87–352)
IgG (Immunoglobin G), Serum: 1262 mg/dL (ref 586–1602)
IgM (Immunoglobulin M), Srm: 208 mg/dL (ref 26–217)

## 2023-09-03 LAB — AFP TUMOR MARKER: AFP, Serum, Tumor Marker: 4.1 ng/mL (ref 0.0–9.2)

## 2023-09-03 LAB — CERULOPLASMIN: Ceruloplasmin: 20.3 mg/dL (ref 19.0–39.0)

## 2023-09-04 NOTE — Progress Notes (Signed)
 GI Office Note    Referring Provider: Halbert Mariano SQUIBB, DO Primary Care Physician:  Halbert Mariano SQUIBB, DO  Primary Gastroenterologist: Carlin POUR. Cindie, DO   Chief Complaint   Chief Complaint  Patient presents with   Follow-up    History of Present Illness   Jamie Bell is a 53 y.o. female presenting today for follow up. Last seen 01/2023.  She has history of cirrhosis (possible NASH related but cannot exclude etoh component, elevated LFTs, GERD, and constipation. H/o adenomatous colon polyps as well. Patient has a history of positive hepatitis C antibody dating back at least to 2013.  HCVRNA neg 2017, 2018, 2019.  She is immune to hepatitis A, contracted illness while in hospital. Received Hep B vaccination. She has history of illicit drug use in the past, IV drugs.  Ex-husband had hepatitis C required treatment. Patient has never had hepatitis C treatment. Her transaminases have been consistently elevated since 2021.    Discussed the use of AI scribe software for clinical note transcription with the patient, who gave verbal consent to proceed.   She has noticed blood on the tissue when wiping, described as a smear, occurring two to four times a month, with the last episode three weeks ago. She has a history of internal hemorrhoids and has had several polyps removed during a colonoscopy 2024. Her stools are dark, sometimes black, and she occasionally takes Pepto Bismol, but has seen dark stools without Pepto use. Her hemoglobin levels have decreased from 15 to 12.3 over the past three months.  She uses Linzess  145mcg daily as needed for constipation. Typically uses only occasionally if she has not had a BM in 2-3 days.   She has occasional swelling in her feet, worsens as day progresses and better in the AMs. Admits to using salt shaker regularly. Takes fluid pill as needed but not daily. None listed on today's med list.   She denies abdominal pain, heartburn, dysphagia,  vomiting. She is frustrated with lack of weight loss. Although she feels Mounjaro 7.5 mg is helping control her sugars she does not appreciate significant appetite reduction or weight loss.     She has a history of liver issues, with chronically elevated liver enzymes. She drinks alcohol  occasionally but does not believe it contributes significantly to her liver enzyme levels. She has recently quit smoking and switched to vaping, which she does less frequently than she smoked. She has noticed easy bruising and takes baby aspirin .      Prior Data   RUQ U/S 07/2023: IMPRESSION: Cirrhotic liver morphology. No focal lesion identified.  EGD 05/2022: -portal HTN gastropathy -gastritis s/p bx, no h.pylori -repeat EGD in 3 years   Colonoscopy 05/2022 -internal hemorrhoids -one 17mm polyp in cecum -one 13mm polyp in ascending colon -two 5-7 mm polyps transverse colon -tubular adenomas, repeat colonoscopy 3 years  Wt Readings from Last 3 Encounters:  09/06/23 211 lb 3.2 oz (95.8 kg)  08/25/23 211 lb 12.8 oz (96.1 kg)  02/09/23 210 lb 12.8 oz (95.6 kg)     Medications   Current Outpatient Medications  Medication Sig Dispense Refill   ACCU-CHEK GUIDE test strip USE TO TEST ONCE DAILY.     albuterol  (VENTOLIN  HFA) 108 (90 Base) MCG/ACT inhaler Inhale 2 puffs into the lungs every 6 (six) hours as needed for wheezing or shortness of breath. 1 each 5   aspirin  EC 81 MG tablet Take 1 tablet (81 mg total) by mouth daily. Swallow whole.  BIOTIN PO Take 1,500 mcg by mouth daily.     Blood Glucose Monitoring Suppl (ACCU-CHEK GUIDE) w/Device KIT USE AS DIRECTED.R     Cariprazine HCl 6 MG CAPS Take 6 mg by mouth daily.     Fluticasone -Umeclidin-Vilant (TRELEGY ELLIPTA ) 200-62.5-25 MCG/ACT AEPB Inhale 1 puff into the lungs daily in the afternoon. 60 each 5   lamoTRIgine (LAMICTAL) 100 MG tablet Take 100 mg by mouth daily.     Lancets MISC Use as directed once daily and as needed; Duration: 90 days      LANTUS  SOLOSTAR 100 UNIT/ML Solostar Pen Inject 28 Units into the skin at bedtime.     LINZESS  145 MCG CAPS capsule TAKE 1 CAPSULE DAILY BEFORE BREAKFAST. (Patient taking differently: Take 145 mcg by mouth daily as needed.) 30 capsule 5   losartan (COZAAR) 50 MG tablet Take 50 mg by mouth daily.     Melatonin 10 MG TABS Take 20 mg by mouth at bedtime.     metFORMIN  (GLUCOPHAGE ) 1000 MG tablet Take 1,000 mg by mouth 2 (two) times daily with a meal.     MOUNJARO 7.5 MG/0.5ML Pen Inject 7.5 mg into the skin once a week.     pantoprazole  (PROTONIX ) 40 MG tablet Take 40 mg by mouth 2 (two) times daily.     pravastatin  (PRAVACHOL ) 40 MG tablet Take 40 mg by mouth at bedtime.     prazosin (MINIPRESS) 1 MG capsule Take 1 mg by mouth at bedtime.     SUBOXONE 8-2 MG FILM Place under the tongue 3 (three) times daily.     SURE COMFORT PEN NEEDLES 31G X 5 MM MISC INJECT AS DIRECTED.O     traZODone  (DESYREL ) 50 MG tablet Take 100 mg by mouth at bedtime.     No current facility-administered medications for this visit.    Allergies   Allergies as of 09/06/2023 - Review Complete 09/06/2023  Allergen Reaction Noted   Aripiprazole  02/09/2023   Bupropion   02/09/2023   Chantix [varenicline] Other (See Comments) 05/28/2017   Ciprofloxacin Swelling 05/20/2005   Gabapentin   02/09/2023   Lisinopril   02/09/2023   Nsaids Hives 05/02/2012   Shrimp [shellfish allergy] Itching 05/19/2017   Tylenol  [acetaminophen ] Other (See Comments) 03/27/2017       Review of Systems   General: Negative for anorexia, weight loss, fever, chills,+fatigue, weakness. ENT: Negative for hoarseness, difficulty swallowing , nasal congestion. CV: Negative for chest pain, angina, palpitations, dyspnea on exertion, peripheral edema.  Respiratory: Negative for dyspnea at rest, dyspnea on exertion, cough, sputum, wheezing.  GI: See history of present illness. GU:  Negative for dysuria, hematuria, urinary incontinence, urinary  frequency, nocturnal urination.  Endo: Negative for unusual weight change.     Physical Exam   BP 121/75 (BP Location: Right Arm, Patient Position: Sitting, Cuff Size: Large)   Pulse (!) 114   Temp 98.6 F (37 C) (Oral)   Ht 5' 6 (1.676 m)   Wt 211 lb 3.2 oz (95.8 kg)   LMP 01/04/2017 (Approximate)   SpO2 96%   BMI 34.09 kg/m    General: Well-nourished, well-developed in no acute distress.  Eyes: No icterus. Mouth: Oropharyngeal mucosa moist and pink   Lungs: Clear to auscultation bilaterally.  Heart: Regular rate and rhythm, no murmurs rubs or gallops.  Abdomen: Bowel sounds are normal, nontender, nondistended, no hepatosplenomegaly or masses,  no abdominal bruits or hernia , no rebound or guarding.  Rectal: not performed Extremities: trace bilateral lower extremity edema.  No clubbing or deformities. Neuro: Alert and oriented x 4   Skin: Warm and dry, no jaundice.   Psych: Alert and cooperative, normal mood and affect.  Labs   09/02/2023: Ceruloplasmin 20.3, ANA negative, alpha-1 antitrypsin level 148, AFP 4.1, INR 1.1, sodium 137, potassium 4.2, BUN 13, creatinine 1.22, albumin 3.8, AST 66, ALT 56, alk phos 90, total bilirubin 0.7, white blood cell count 4200, hemoglobin 12.3, MCV 94.5, platelets 111,000, IgG 1262, IgA 292, IgM 208  06/16/2023: HIV nonreactive, AST 50, ALT 48, HCV RNA not detected, hepatitis B surface antibody less than 3.5, F-actin IgG 52 elevated, AFP 30 April 2023: Mitochondrial antibody M2 IgG less than 20 Imaging Studies   No results found.  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 -chronic mild elevation of AST/ALT with extensive evaluation as outlined. Has had positive F-actin IgG.  -Thrombocytopenia with slight decline from baseline -EGD up to date -obtain missed labs (alpha 1 antitrypsin phenotype, ASMA) -recommend try Hep B booster shot to see if she can mount immunity. 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    Edema of lower extremities Mild edema likely multifactorial including age, liver condition, and high salt intake. - Advise reduction of salt intake, 2 gram sodium restriction   Rectal bleeding: -suspect due to internal hemorrhoids given 2024 colonoscopy findings -continue to monitor. If episodes become more frequent, would consider if colonoscopy needs to be updated  Possible melena: -Hgb recently normal but overall decline in past 3 months -update labs    Sonny RAMAN. Ezzard, MHS, PA-C [facility]

## 2023-09-06 ENCOUNTER — Telehealth: Payer: Self-pay | Admitting: Gastroenterology

## 2023-09-06 ENCOUNTER — Encounter: Payer: Self-pay | Admitting: Gastroenterology

## 2023-09-06 ENCOUNTER — Ambulatory Visit (INDEPENDENT_AMBULATORY_CARE_PROVIDER_SITE_OTHER): Admitting: Gastroenterology

## 2023-09-06 ENCOUNTER — Ambulatory Visit: Payer: Self-pay | Admitting: Gastroenterology

## 2023-09-06 VITALS — BP 121/75 | HR 114 | Temp 98.6°F | Ht 66.0 in | Wt 211.2 lb

## 2023-09-06 DIAGNOSIS — R7989 Other specified abnormal findings of blood chemistry: Secondary | ICD-10-CM | POA: Diagnosis not present

## 2023-09-06 DIAGNOSIS — K625 Hemorrhage of anus and rectum: Secondary | ICD-10-CM

## 2023-09-06 DIAGNOSIS — D696 Thrombocytopenia, unspecified: Secondary | ICD-10-CM | POA: Diagnosis not present

## 2023-09-06 DIAGNOSIS — K7460 Unspecified cirrhosis of liver: Secondary | ICD-10-CM

## 2023-09-06 DIAGNOSIS — R600 Localized edema: Secondary | ICD-10-CM

## 2023-09-06 DIAGNOSIS — F1090 Alcohol use, unspecified, uncomplicated: Secondary | ICD-10-CM

## 2023-09-06 NOTE — Telephone Encounter (Signed)
 Jamie Bell, patient needs labs in 2-3 weeks. Please put in for [facility] lab specifically, they converted the [facility] labs last time and messed up some of my orders.   She needs CBC with diff, Alpha 1 antitrypsin phenotype, smooth muscle antibody, CMET, Dx: rectal bleeding/melena, cirrhosis, elevated lfts.

## 2023-09-06 NOTE — Patient Instructions (Signed)
 Please complete labs at [facility] lab in 2-3 weeks.  Please monitor for further fresh blood in stool. I suspect it is due to internal hemorrhoids. If you note more frequently, please let me know so we can consider updating colonoscopy. Your last one was done in 05/2022, with plans for next one in 05/2025 due to history of polyps but if your bleeding becomes more frequent we will need to update sooner.   It is important that you refrain from all alcohol . No amount of alcohol  is safe for you given your liver cirrhosis.

## 2023-09-07 ENCOUNTER — Other Ambulatory Visit: Payer: Self-pay

## 2023-09-07 DIAGNOSIS — R7989 Other specified abnormal findings of blood chemistry: Secondary | ICD-10-CM

## 2023-09-07 DIAGNOSIS — K7460 Unspecified cirrhosis of liver: Secondary | ICD-10-CM

## 2023-09-07 LAB — MISC LABCORP TEST (SEND OUT): Labcorp test code: 163980

## 2023-09-07 NOTE — Telephone Encounter (Signed)
 Labs have been entered and pt was instructed at the time of her ov to have done in 2 to 3 weeks.

## 2023-12-21 ENCOUNTER — Ambulatory Visit (HOSPITAL_COMMUNITY): Admitting: Physical Therapy

## 2023-12-21 NOTE — Therapy (Deleted)
 OUTPATIENT PHYSICAL THERAPY FEMALE PELVIC EVALUATION   Patient Name: Jamie Bell MRN: [id number] DOB:[birth year], 53 y.o., female Today's Date: 12/21/2023  END OF SESSION:   Past Medical History:  Diagnosis Date   Abscess    NECK ANTERIOR   Arthritis    Chronic back pain    Cirrhosis (HCC)    Constipation    DDD (degenerative disc disease)    Decreased hearing on the right.   F/u by ENT in the past and no further management.   Depression    Diabetes mellitus    Dyspnea    with exertion   GERD (gastroesophageal reflux disease)    High cholesterol    Hypertension    took med. for HTN, several yrs. ago, no longer needs , seen by Dr. Juventino last yr. during a hosp. at [facility], for MRSA bacteremia    MRSA bacteremia 2014   Tx with Vancomycin    NASH (nonalcoholic steatohepatitis)    PCOS (polycystic ovarian syndrome)    Pneumonia 2016   Polysubstance abuse (HCC)    Sciatica    HNP- Cerv. 7- T2   Past Surgical History:  Procedure Laterality Date   ANTERIOR CERVICAL DECOMP/DISCECTOMY FUSION N/A 05/28/2017   Procedure: ANTERIOR CERVICAL DECOMPRESSION CERVICAL THREE-FOUR;  Surgeon: Gillie Duncans, MD;  Location: [facility];  Service: Neurosurgery;  Laterality: N/A;  anterior   BACK SURGERY     BIOPSY  06/05/2022   Procedure: BIOPSY;  Surgeon: Cindie Carlin POUR, DO;  Location: [facility];  Service: Endoscopy;;   BREAST EXCISIONAL BIOPSY Left 2007   benign/clogged milk ducts   CARPAL TUNNEL RELEASE     CATARACT EXTRACTION W/PHACO Left 02/04/2015   Procedure: CATARACT EXTRACTION PHACO AND INTRAOCULAR LENS PLACEMENT (IOC);  Surgeon: Cherene Mania, MD;  Location: [facility];  Service: Ophthalmology;  Laterality: Left;  CDE: 4.11   CATARACT EXTRACTION W/PHACO Right 05/24/2020   Procedure: CATARACT EXTRACTION PHACO AND INTRAOCULAR LENS PLACEMENT RIGHT EYE;  Surgeon: Harrie Agent, MD;  Location: [facility];  Service: Ophthalmology;  Laterality: Right;  right CDE=28.55   CHOLECYSTECTOMY      COLONOSCOPY WITH PROPOFOL  N/A 11/12/2015   Dr. fields:Small internal hemorrhoids, 2 polyps removed (tubular adenomas) with noted congestion and edema in the left colon most consistent with NSAID use.  Next colonoscopy in 5 to 10 years.   COLONOSCOPY WITH PROPOFOL  N/A 06/05/2022   Procedure: COLONOSCOPY WITH PROPOFOL ;  Surgeon: Cindie Carlin POUR, DO;  [facility]: [facility];  Service: Endoscopy;  Laterality: N/A;  215pm, asa 3   ESOPHAGEAL BANDING N/A 11/12/2015   Procedure: ESOPHAGEAL BANDING;  Surgeon: Margo LITTIE Haddock, MD;  [facility]: [facility];  Service: Endoscopy;  Laterality: N/A;   ESOPHAGOGASTRODUODENOSCOPY  2007   Dr. Haddock: mild antral erythema. Future procedures need to be done with Propofol  per notes.    ESOPHAGOGASTRODUODENOSCOPY (EGD) WITH PROPOFOL  N/A 11/12/2015   Dr. haddock: Normal esophagus with no varices.  Gastritis and duodenitis, biopsy without H. pylori, most consistent with NSAID/aspirin  use.   ESOPHAGOGASTRODUODENOSCOPY (EGD) WITH PROPOFOL  N/A 10/17/2019   Carver: no esophageal varices. biopsies from esophagus c/w reflux esophagitis. she had portal hypertensive gastropathy. gastric bx negative for H.pylori and showed nonspecific reactive gastropathy. next EGD in 3 years.    ESOPHAGOGASTRODUODENOSCOPY (EGD) WITH PROPOFOL  N/A 06/05/2022   Procedure: ESOPHAGOGASTRODUODENOSCOPY (EGD) WITH PROPOFOL ;  Surgeon: Cindie Carlin POUR, DO;  [facility];  Service: Endoscopy;  Laterality: N/A;   INCISION AND DRAINAGE WOUND WITH TENDON REPAIR Right 07/03/2016  Procedure: INCISION AND DRAINAGE RIGHT LONG PROXIMAL INTERPHALANGEAL JOINT WOUND WITH  TENDON REPAIR AND CULTURES;  Surgeon: Sissy Cough, MD;  Location: [facility];  Service: Orthopedics;  Laterality: Right;   IR RADIOLOGIST EVAL & MGMT  09/24/2020   NECK SURGERY     PERIPHERALLY INSERTED CENTRAL CATHETER INSERTION Right 2007   for treatment with Vancomycin     POLYPECTOMY  11/12/2015   Procedure: POLYPECTOMY;   Surgeon: Margo LITTIE Haddock, MD;  [facility]: [facility];  Service: Endoscopy;;  transverse colon polyp   POLYPECTOMY  06/05/2022   Procedure: POLYPECTOMY;  Surgeon: Cindie Carlin POUR, DO;  [facility];  Service: Endoscopy;;   POSTERIOR CERVICAL LAMINECTOMY N/A 11/22/2013   Procedure: POSTERIOR CERVICAL LAMINECTOMY C7/T1, T/1-2;  Surgeon: Arley SHAUNNA Helling, MD;  Location: [facility];  Service: Neurosurgery;  Laterality: N/A;  POSTERIOR CERVICAL LAMINECTOMY C7/T1, T/1-2   TEE WITHOUT CARDIOVERSION N/A 08/09/2012   Procedure: TRANSESOPHAGEAL ECHOCARDIOGRAM (TEE);  Surgeon: Lamar CHRISTELLA Rom, MD;  Location: [facility];  Service: Cardiovascular;  Laterality: N/A;   Patient Active Problem List   Diagnosis Date Noted   Abdominal pain, epigastric 02/09/2023   Early satiety 05/04/2022   Tubular adenoma 05/04/2022   Abnormal LFTs 04/28/2021   Kidney lesion 11/08/2019   Routine cervical smear 08/09/2019   GERD (gastroesophageal reflux disease) 03/23/2018   Hemorrhoids 12/15/2017   Dysphagia 09/06/2017   IVDU (intravenous drug user) 05/30/2017   Status post cervical spinal fusion 05/30/2017   Abscess in epidural space of cervical spine 05/29/2017   Rectal bleeding 10/31/2015   Constipation    Cirrhosis (HCC) 09/29/2015   Type 2 diabetes mellitus with diabetic chronic kidney disease (HCC)    Anxiety state    Depression    Chronic back pain    Spinal stenosis in cervical region 11/22/2013   Opiate dependence (HCC) 06/04/2011   TOBACCO USER 11/03/2008   HYPERTENSION, BENIGN 06/10/2006   DM (diabetes mellitus), type 2 (HCC) 04/06/2006   POLYCYSTIC OVARIAN DISEASE 04/06/2006    PCP: Halbert Castillo  REFERRING PROVIDER: Halbert Castillo  REFERRING DIAG: Urinary incontinence R32  THERAPY DIAG:  Urinary incontinence R32  Rationale for Evaluation and Treatment: Rehabilitation  ONSET DATE: ***  SUBJECTIVE:                                                                                                                                                                                            SUBJECTIVE STATEMENT: *** Fluid intake:   FUNCTIONAL LIMITATIONS: ***  PERTINENT HISTORY:  Medications for current condition: *** Surgeries: *** Other: *** Sexual abuse: {Yes/No:304960894}  PAIN:  Are you  having pain? {yes/no:20286} NPRS scale: ***/10 Pain location: {pelvic pain location:27098}  Pain type: {type:313116} Pain description: {PAIN DESCRIPTION:21022940}   Aggravating factors: *** Relieving factors: ***  PRECAUTIONS: {Therapy precautions:24002}  RED FLAGS: {PT Red Flags:29287}   WEIGHT BEARING RESTRICTIONS: {Yes ***/No:24003}  FALLS:  Has patient fallen in last 6 months? {fallsyesno:27318}  OCCUPATION: ***  ACTIVITY LEVEL : ***  PLOF: {PLOF:24004}  PATIENT GOALS: ***   BOWEL MOVEMENT: Pain with bowel movement: {yes/no:20286} Type of bowel movement:{PT BM type:27100} Fully empty rectum: {No/Yes:304960894} Leakage: {Yes/No:304960894}                                                  Caused by: *** Bowel urgency: *** Pads: {Yes/No:304960894} Fiber supplement/laxative {YES/NO AS:20300}  URINATION: Pain with urination: {yes/no:20286} Fully empty bladder: {Yes/No:304960894}***                                         Post-void dribble: {YES/NO AS:20300} Stream: {PT urination:27102} Urgency: {YES/NO AS:20300} Frequency:during the day ***                                                        Nocturia: {Yes/No:304960894}***   Leakage: {PT leakage:27103} Pads/briefs: {Yes/No:304960894}  INTERCOURSE:  Ability to have vaginal penetration {YES/NO:21197} Pain with intercourse: {pain with intercourse PA:27099} Dryness: {YES/NO AS:20300} Climax: *** Marinoff Scale: ***/3 Lubricant:  PREGNANCY: Vaginal deliveries *** Tearing {Yes***/No:304960894} Episiotomy {YES/NO AS:20300} C-section deliveries *** Currently pregnant  {Yes***/No:304960894}  PROLAPSE: {PT prolapse:27101}   OBJECTIVE:  Note: Objective measures were completed at Evaluation unless otherwise noted.  DIAGNOSTIC FINDINGS:  Post-void residual: Voiding Cystourethrogram (VCUG):  Ultrasound: ***  PATIENT SURVEYS:  {rehab surveys:24030}  PFIQ-7: *** UIQ-7 *** CRAIG -7 *** POPIQ-7 *** Female Sexual Function Index (FSFI) Questionnaire ***  COGNITION: Overall cognitive status: {cognition:24006}     SENSATION: Light touch: {intact/deficits:24005}  LUMBAR SPECIAL TESTS:  {lumbar special test:25242}  FUNCTIONAL TESTS:  {Functional tests:24029} Single leg stance:  Rt:  Lt: Sit-up test: Squat: Bed mobility:  GAIT: Assistive device utilized: {Assistive devices:23999} Comments: ***  POSTURE: {posture:25561}   LUMBARAROM/PROM:  A/PROM A/PROM  Eval (% available)  Flexion   Extension   Right lateral flexion   Left lateral flexion   Right rotation   Left rotation    (Blank rows = not tested)  LOWER EXTREMITY ROM:  {AROM/PROM:27142} ROM Right eval Left eval  Hip flexion    Hip extension    Hip abduction    Hip adduction    Hip internal rotation    Hip external rotation    Knee flexion    Knee extension    Ankle dorsiflexion    Ankle plantarflexion    Ankle inversion    Ankle eversion     (Blank rows = not tested)  LOWER EXTREMITY MMT:  MMT Right eval Left eval  Hip flexion    Hip extension    Hip abduction    Hip adduction    Hip internal rotation    Hip external rotation    Knee flexion    Knee extension  Ankle dorsiflexion    Ankle plantarflexion    Ankle inversion    Ankle eversion     (Blank rows = not tested) PALPATION:  General: ***  Pelvic Alignment: ***  Abdominal: ***  Diastasis: {Yes/No:304960894}*** Distortion: {YES/NO AS:20300}  Breathing: *** Scar tissue: {Yes/No:304960894}*** Active Straight Leg Raise: ***                External Perineal Exam: ***                              Internal Pelvic Floor: ***  Patient confirms identification and approves PT to assess internal pelvic floor and treatment {yes/no:20286} All internal or external pelvic floor assessments and/or treatments are completed with proper hand hygiene and gloves hands. If needed gloves are changed with hand hygiene during patient care time.  PELVIC MMT:   MMT eval  Vaginal   Internal Anal Sphincter   External Anal Sphincter   Puborectalis   (Blank rows = not tested)        TONE: ***  PROLAPSE: ***  TODAY'S TREATMENT:                                                                                                                              DATE: ***  EVAL ***   PATIENT EDUCATION:  Education details: *** Person educated: {Person educated:25204} Education method: {Education Method:25205} Education comprehension: {Education Comprehension:25206}  HOME EXERCISE PROGRAM: ***  ASSESSMENT:  CLINICAL IMPRESSION: Patient is a *** y.o. *** who was seen today for physical therapy evaluation and treatment for ***.   OBJECTIVE IMPAIRMENTS: {opptimpairments:25111}.   ACTIVITY LIMITATIONS: {activitylimitations:27494}  PARTICIPATION LIMITATIONS: {participationrestrictions:25113}  PERSONAL FACTORS: {Personal factors:25162} are also affecting patient's functional outcome.   REHAB POTENTIAL: {rehabpotential:25112}  CLINICAL DECISION MAKING: {clinical decision making:25114}  EVALUATION COMPLEXITY: {Evaluation complexity:25115}   GOALS: Goals reviewed with patient? {yes/no:20286}  SHORT TERM GOALS: Target date: ***  *** Baseline: Goal status: INITIAL  2.  *** Baseline:  Goal status: INITIAL  3.  *** Baseline:  Goal status: INITIAL  4.  *** Baseline:  Goal status: INITIAL  5.  *** Baseline:  Goal status: INITIAL  6.  *** Baseline:  Goal status: INITIAL  LONG TERM GOALS: Target date: ***  *** Baseline:  Goal status: INITIAL  2.  *** Baseline:   Goal status: INITIAL  3.  *** Baseline:  Goal status: INITIAL  4.  *** Baseline:  Goal status: INITIAL  5.  *** Baseline:  Goal status: INITIAL  6.  *** Baseline:  Goal status: INITIAL  PLAN:  PT FREQUENCY: {rehab frequency:25116}  PT DURATION: {rehab duration:25117}  PLANNED INTERVENTIONS: {rehab planned interventions:25118::97110-Therapeutic exercises,97530- Therapeutic [phone]- Neuromuscular re-education,97535- Self Rjmz,02859- Manual therapy,Patient/Family education}  PLAN FOR NEXT SESSION: ***   Danelly Hassinger,CINDY, PT 12/21/2023, 1:56 PM

## 2024-03-08 ENCOUNTER — Ambulatory Visit: Admitting: Gastroenterology
